# Patient Record
Sex: Male | Born: 1935
Health system: Southern US, Community
[De-identification: ages and names within clinical notes are randomized; demographics above are authoritative.]

## PROBLEM LIST (undated history)

## (undated) DIAGNOSIS — I739 Peripheral vascular disease, unspecified: Secondary | ICD-10-CM

## (undated) DIAGNOSIS — I2510 Atherosclerotic heart disease of native coronary artery without angina pectoris: Secondary | ICD-10-CM

## (undated) DIAGNOSIS — K922 Gastrointestinal hemorrhage, unspecified: Secondary | ICD-10-CM

## (undated) DIAGNOSIS — R0600 Dyspnea, unspecified: Secondary | ICD-10-CM

## (undated) DIAGNOSIS — G4709 Other insomnia: Secondary | ICD-10-CM

## (undated) DIAGNOSIS — C679 Malignant neoplasm of bladder, unspecified: Secondary | ICD-10-CM

## (undated) DIAGNOSIS — I10 Essential (primary) hypertension: Secondary | ICD-10-CM

## (undated) DIAGNOSIS — E119 Type 2 diabetes mellitus without complications: Secondary | ICD-10-CM

## (undated) DIAGNOSIS — I509 Heart failure, unspecified: Secondary | ICD-10-CM

## (undated) DIAGNOSIS — E785 Hyperlipidemia, unspecified: Secondary | ICD-10-CM

## (undated) DIAGNOSIS — R42 Dizziness and giddiness: Secondary | ICD-10-CM

## (undated) DIAGNOSIS — G629 Polyneuropathy, unspecified: Secondary | ICD-10-CM

## (undated) DIAGNOSIS — I38 Endocarditis, valve unspecified: Secondary | ICD-10-CM

## (undated) DIAGNOSIS — R0609 Other forms of dyspnea: Secondary | ICD-10-CM

## (undated) HISTORY — DX: Peripheral vascular disease, unspecified: I73.9

## (undated) HISTORY — DX: Atherosclerotic heart disease of native coronary artery without angina pectoris: I25.10

## (undated) HISTORY — PX: CATARACT EXTRACTION: SUR2

## (undated) HISTORY — DX: Hyperlipidemia, unspecified: E78.5

## (undated) HISTORY — PX: HEMORRHOID SURGERY: SHX153

## (undated) HISTORY — PX: COLONOSCOPY: SHX174

## (undated) HISTORY — DX: Gastrointestinal hemorrhage, unspecified: K92.2

## (undated) HISTORY — DX: Endocarditis, valve unspecified: I38

## (undated) HISTORY — DX: Other insomnia: G47.09

## (undated) HISTORY — DX: Dizziness and giddiness: R42

## (undated) HISTORY — DX: Dyspnea, unspecified: R06.00

## (undated) HISTORY — DX: Polyneuropathy, unspecified: G62.9

## (undated) HISTORY — PX: INGUINAL HERNIA REPAIR: SUR1180

## (undated) HISTORY — DX: Malignant neoplasm of bladder, unspecified: C67.9

## (undated) HISTORY — DX: Other forms of dyspnea: R06.09

## (undated) HISTORY — DX: Essential (primary) hypertension: I10

---

## 2007-04-17 ENCOUNTER — Ambulatory Visit: Payer: Self-pay | Admitting: Vascular Surgery

## 2007-05-27 ENCOUNTER — Ambulatory Visit: Payer: Self-pay | Admitting: Cardiology

## 2007-06-06 ENCOUNTER — Ambulatory Visit: Payer: Self-pay | Admitting: Cardiology

## 2007-07-10 ENCOUNTER — Ambulatory Visit: Payer: Self-pay | Admitting: Vascular Surgery

## 2007-07-31 ENCOUNTER — Ambulatory Visit: Payer: Self-pay | Admitting: Cardiology

## 2008-01-08 ENCOUNTER — Ambulatory Visit: Payer: Self-pay | Admitting: Vascular Surgery

## 2009-08-11 ENCOUNTER — Ambulatory Visit: Payer: Self-pay | Admitting: Vascular Surgery

## 2010-02-01 ENCOUNTER — Ambulatory Visit: Payer: Self-pay | Admitting: Vascular Surgery

## 2010-08-02 NOTE — Assessment & Plan Note (Signed)
OFFICE VISIT   JEMEL, ONO [facility]  DOB:  [birth year]                                       04/17/2007  VFIEP#:[id number]   ADDENDUM:  I had a lengthy discussion with the patient today about the  benefits of Plavix versus aspirin alone for his PAD and coronary artery  disease.  I discussed with him that the benefit was minimal compared to  aspirin alone, compared to the cost of the medication.  I believe that  he is probably safe with just aspirin alone.  He will discuss this with  Dr. Sherril Croon as well.   Janetta Hora. Fields, MD  Electronically Signed   CEF/MEDQ  D:  04/17/2007  T:  04/18/2007  Job:  [id number]

## 2010-08-02 NOTE — Assessment & Plan Note (Signed)
OFFICE VISIT   Peter James, Peter James [facility]  DOB:  08/01/1925                                       07/10/2007  AOZHY#:[id number]   The patient returns for followup today for numbness and tingling in both  lower extremities.  He has previous evidence of some mild arterial  occlusive disease and primarily has neuropathy symptoms.  He was last  seen January 2009.  He has had no significant change in his symptoms  since then.  He still complains of burning, numbness, and tingling from  the ankle down into the foot bilaterally.  Both feet are evenly  affected.   He did have a hemoglobin A1c performed by Dr. Sherril Croon which was 5.8.  He  also has been started on Lyrica and he states that he has had some mild  improvement of symptoms.   PHYSICAL EXAM:  Today blood pressure 155/86, pulse 52 and regular.  Lower Extremities:  He has 2+ femoral pulses with absent popliteal and  pedal pulses bilaterally.  ABIs today were 0.71 on the right and 0.99 on  the left.   Overall the patient's symptoms, I believe, are more consistent with  neuropathy than peripheral arterial disease.  He does have some evidence  of mild occlusive disease in the right lower extremity, but certainly  not significant enough to warrant any intervention at this time.  He  will follow up in six months' time for repeat ABIs.  We have also  started him on Pletal 100 mg twice a day.   Janetta Hora. Fields, MD  Electronically Signed   CEF/MEDQ  D:  07/10/2007  T:  07/11/2007  Job:  [id number]   cc:   Marcello Moores, MD  Doreen Beam, MD

## 2010-08-02 NOTE — Procedures (Signed)
LOWER EXTREMITY ARTERIAL EVALUATION-SINGLE LEVEL   INDICATION:  Bilateral foot numbness.  Patient complains of bilateral  foot numbness at rest with position.  Patient denies claudication  bilaterally.   HISTORY:  Diabetes:  No.  Cardiac:  No.  Hypertension:  Yes.  Smoking:  No.  Previous Surgery:  No.   RESTING SYSTOLIC PRESSURES: (ABI)                          RIGHT                LEFT  Brachial:               130                  140  Anterior tibial:        100 (0.71)           90  Posterior tibial:       90                   138 (.99)  Peroneal:  DOPPLER WAVEFORM ANALYSIS:  Anterior tibial:        Monophasic           Monophasic  Posterior tibial:       Monophasic           Biphasic  Peroneal:   PREVIOUS ABI'S:  Date: 03/27/07  RIGHT:  0.89  LEFT:  0.99   IMPRESSION:  1. Decrease in right ankle brachial index.  2. Stable left ankle brachial index.   ___________________________________________  Janetta Hora Fields, MD   PB/MEDQ  D:  07/10/2007  T:  07/10/2007  Job:  [id number]

## 2010-08-02 NOTE — Assessment & Plan Note (Signed)
OFFICE VISIT   RAKWON, LETOURNEAU  DOB:  1925-09-26                                       01/08/2008  ZOXWR#:[id number]   The patient returns for further followup today.  He was last seen in  April 2009.  He has had chronic problems of neuropathy but also some  mild lower extremity arterial occlusive disease.  His primary complaint  is numbness and tingling in the legs and feet.  He does not really  describe claudication symptoms.  He denies history of diabetes.  He had  a hemoglobin A1c performed by Dr. Sherril Croon in April 2009 which was 5.8.  Dr.  Sherril Croon also started him on Lyrica at that time he has had some improvement  of his symptoms.  He states that he has continued to take his Pletal,  but has not noticed any benefit from now.  I told him he could stop this  today.  He currently walks about 20 minutes every other day without any  claudication symptoms.  He denies any other possible risk factors for  neuropathy, but states he did have a thermal injury in both feet due to  cold in [place] several years ago.  I guess this could possibly be enough  trauma to the cause him neuropathy at this point.   PHYSICAL EXAM:  Today blood pressure 162/83 in the left arm, pulse is 93  and regular.  Lower Extremities:  He has 2+ femoral pulses bilaterally  with absent popliteal and pedal pulses.   ABIs today were 0.70 on the right and 1.0 on the left.  These are  essentially unchanged from April 2009.  In summary, the patient has  evidence of peripheral arterial occlusive disease in his lower  extremities.  However, his symptomatology is more closely related to, I  think, neuropathy rather than occlusive disease.  He currently is not at  risk of limb loss in either lower extremity.  I reassured him regarding  this today.  He will stop his Pletal.  He will continue to follow up  with me every 6 months for repeat ABIs or sooner if he has progression  of symptoms of development of  nonhealing wounds.   Janetta Hora. Fields, MD  Electronically Signed   CEF/MEDQ  D:  01/08/2008  T:  01/09/2008  Job:  [id number]   cc:   Marcello Moores, MD  Doreen Beam, MD

## 2010-08-02 NOTE — Assessment & Plan Note (Signed)
OFFICE VISIT   EBBIE, CHERRY  DOB:  10-27-1925                                       08/11/2009  ZOXWR#:[id number]   The patient returns for followup today.  He was last seen in October of  2009.  We have been following him for lower extremity arterial occlusive  disease and claudication symptoms.  He presents today complaining still  that he has numbness and tingling and a cold sensation that occurs in  both feet.  He apparently had some relief from Lyrica in the past but  states this no longer helps.  He really does not complain of any  problems with ambulation but states that his symptoms are more nuisance  in nature.  He denies any rest pain.  He denies any claudication.   Chronic medical problems include peripheral neuropathy, hypertension.  He is a former smoker.  His hypertension and neuropathy are currently  stable and being followed by Dr. Sherril Croon.   He states that he does not really have any maneuvers that relieve or  make his symptoms worse.  It is just overall some good days and some bad  days.   REVIEW OF SYSTEMS:  Full 12 point review of systems was performed with  the patient today.  Please see intake referral form for details  regarding that.   SOCIAL HISTORY:  He is married.  He has one child.  He is retired.  He  is a former smoker as mentioned above.   FAMILY HISTORY:  Family history is remarkable for his mother and sisters  who had vascular disease at age less than 7.   MEDICATIONS:  Include lisinopril, amlodipine, diazepam, allopurinol,  clonazepam, aspirin.   ALLERGIES:  He has no known drug allergies.   PHYSICAL EXAM:  Vital signs:  Blood pressure is 155/98 in the left arm,  heart rate 72 and regular.  Oxygen saturation is 97% on room air.  HEENT:  Unremarkable.  Neck:  Has 2+ carotid pulses without bruit.  Chest:  Clear to auscultation.  Cardiac:  Exam is regular rate and  rhythm without murmur.  Abdomen:  Soft, nontender,  nondistended.  No  masses.  Extremities:  He has a 2+ right femoral pulse.  He has a 1+  left femoral pulse.  He has no popliteal or pedal pulses bilaterally.  He has 2+ radial pulses bilaterally.  Musculoskeletal:  He has no  significant edema in the lower extremities.  He has no significant major  joint deformities.  Skin:  Shows no open ulcers or rashes.  Neurologic:  Shows symmetric upper extremity and lower extremity motor strength which  is 5/5.  He has decreased sensation in the feet bilaterally to light  touch.   He had bilateral ABIs today which were 0.83 on the right and 1.09 on the  left.  Waveforms were biphasic to monophasic in character.  I also  reviewed his recent ABIs performed at [facility] on 07/12/2009.  This suggested primarily femoral artery occlusive disease in the common  and superficial femoral arteries with some tibial disease and also some  suggestion of external iliac artery occlusive disease bilaterally.   I had a lengthy discussion with the patient today regarding his lower  extremities.  I still believe most of his symptoms are neuropathic in  nature.  I have switched him  from Lyrica to Neurontin today.  He will  take 300 mg three times a day to see if he gets some relief.  He does  certainly have some component of lower extremity arterial occlusive  disease but I am not completely convinced all of his symptoms are coming  from this.  I did offer him today diagnostic arteriogram possible  angioplasty and stenting especially if he had iliac occlusive disease  but I am not completely convinced this will help all of his symptoms.  He states he will think about whether he wants the arteriogram done.  He  will follow up with me in six months' time with repeat ABIs in either  case.  He will follow up sooner if he wishes to have an arteriogram  performed.     Janetta Hora. Fields, MD  Electronically Signed   CEF/MEDQ  D:  08/11/2009  T:  08/12/2009  Job:   [id number]   cc:   Marcello Moores, MD  Doreen Beam, MD

## 2010-08-02 NOTE — Assessment & Plan Note (Signed)
OFFICE VISIT   Peter James, Peter James [facility]  DOB:  [birth year]                                       04/17/2007  BMWUX#:[id number]   The patient is a 77 year old male referred by Dr. Sherril Croon for evaluation of  lower extremity numbness and tingling.  The patient states that his feet  have been numb and cold chronically.  This has been going on for  approximately 3 years.  He has had extensive workup for this, what  sounds like an evaluation for neuropathy as well as peripheral arterial  disease.  He has had no previous episodes of ulcerations to the feet.  He denies rest pain.  He denies classic claudication symptoms.   His atherosclerotic risk factors include hypertension.  He denies prior  history of diabetes.  He denies any significant alcohol history.   His family history is remarkable for his mother who had vascular disease  at a young age.   His past surgical history had a left inguinal hernia repair on 2  occasions.  He has also had a hemorrhoidectomy.   MEDICATIONS:  Include allopurinol 300 mg once a day, Meclizine 25 mg  once a day, Plavix 75 mg once a day started for his PAD.  Lisinopril 20  mg once a day.  Amlodipine 5 mg once a day.  Diclofenac 75 mg once a  day.  Lyrica 75 mg nightly.   PAST MEDICAL HISTORY:  Significant for neuropathy, right shoulder  arthritis, and occasional dizzy spells.   SOCIAL HISTORY:  He is married.  He is a nonsmoker.  He quit smoking in  1975.  He does not drink alcohol regularly and has not drank for several  years.  He was never a heavy drinker.   REVIEW OF SYSTEMS:  He is 6 feet 2 inches, 219 pounds.  Cardiac, pulmonary, GI, renal, psychiatric, ENT, and hematologic systems  are negative.  VASCULAR:  He denies prior history of stroke or TIA.  NEUROLOGIC:  He has occasional dizzy spells, as mentioned above.  He has  multiple joint arthritis.   He has no known drug allergies.   PHYSICAL EXAM:  Blood pressure is 153/95  in the left arm, heart rate is  70 and regular.  HEENT is unremarkable.  Neck has 2+ carotid pulses  without bruits.  Chest was clear to auscultation.  Cardiac exam is  regular rate and rhythm without murmur.  Abdomen is soft and nontender.  Nondistended.  Extremities, he has 2+ brachial and radial pulses  bilaterally.  He has 2+ femoral pulses with absent popliteal and pedal  pulses bilaterally.  He has thickened toenails on both feet, but no  ulcerations.  The feet are cool, but adequately perfused.   He had bilateral ABI performed in [place] on March 27, 2007.  This showed  ABI on the right of 0.89 and on the left of 0.99.  Doppler signals were  monophasic and the dorsalis pedis and posterior tibial artery  bilaterally.   I believe the patient's symptoms are primarily neuropathic in nature.  Hopefully, he will have some relief with his Lyrica medication.  He does  have slightly reduced ABI bilaterally.  On exam, he probably has some  superficial femoral artery and tibial artery occlusive disease.  His  symptoms are not severe enough to warrant any intervention at this time.  I have counseled him that he should continue walking and to check his  feet daily for any signs of trauma.  Have also ordered hemoglobin A1c to  test him again for any evidence of diabetes since he has several  neurologic symptoms related to this.  He will follow up with me in 3  months' time for repeat ABI.  I have reassured him that he is at low  risk of limb loss at this point.   Janetta Hora. Fields, MD  Electronically Signed   CEF/MEDQ  D:  04/17/2007  T:  04/18/2007  Job:  [id number]   cc:   Dr. Katherene Ponto, MD

## 2010-08-02 NOTE — Assessment & Plan Note (Signed)
[facility]                          [facility] OFFICE NOTE   Peter James, Peter James [facility]                  MRN:          [id number]  DATE:05/27/2007                            DOB:          01-06-1926    PRIMARY CARDIOLOGIST:  Learta Codding, MD, (new.)   REFERRING PHYSICIAN:  Doreen Beam, MD.   REASON FOR CONSULTATION:  Risk stratification.   HISTORY:  Peter James is a very pleasant 77 year old male, with no  history of documented coronary artery disease, who is now referred for  risk stratification.   The patient was recently evaluated by Dr. Fabienne Bruns in [place]  for possible peripheral vascular disease.  This was following some  recent lower extremity arterial Dopplers for evaluation of lower  extremity numbness which suggested mild decrease,  particularly on the  right.  The ABIs were 0.89 right; 0.99.  Of note, the TBIs were 0.53  right and 0.69 left.   Dr. Darrick Penna noted that the patient denied any classic symptoms of  claudication and, in fact, he does so today, as well.  Dr. Darrick Penna  concluded that the patient's symptoms were primarily neuropathic, for  which hopefully Lyrica would provide some relief.  He recommended  continued conservative measures (i.e. walking).  He also ordered a  hemoglobin A1c, which was reportedly negative.   The patient's cardiac risk factors are notable for hypertension, age and  significant family history.  He has no known diabetes mellitus.  A  recent lipid profile revealed total cholesterol 171, triglyceride 175,  HDL 37, LDL 99.  The patient denies any history of  angina pectoris,  either with exertion or at rest.  He denies any recent development of  exertional dyspnea.  He also denies any symptoms suggestive of  intermittent claudication.   The patient refers to some transient arm numbness some 25 years ago, for  which he was transferred to [facility] for further evaluation.  He  reports  having had a cardiac catheterization, which was reportedly  normal.  He has not had any subsequent cardiac testing.   DIAGNOSTICS:  Electrocardiogram today reveals NSR at 79 bpm with normal  axis and nonspecific ST abnormalities.   ALLERGIES:  NO KNOWN DRUG ALLERGIES.   CURRENT MEDICATIONS:  1. Aspirin 81 daily.  2. Allopurinol.  3. Lisinopril 20 daily.  4. Amlodipine 5 daily.  5. Lyrica 75 daily.   PAST MEDICAL HISTORY:  1. Hypertension.  2. Peripheral vascular disease as outlined above.  3. Arthritis.   PAST SURGICAL HISTORY:  Hernia repair.   SOCIAL HISTORY:  The patient lives with his wife.  They have 1 grown  daughter.  He is retired from [employer].  He stopped smoking some  35 years ago.  Denies alcohol use.   FAMILY HISTORY:  Mother died at age 53 of a fatal myocardial infarction.  Father succumbed to cancer.  The patient reports all 4 of his sisters  died of heart attacks, 2 suddenly.  He has no brothers.   PHYSICAL EXAMINATION:  VITAL SIGNS:  Blood pressure 143/89, pulse 77  regular, weight 215.6.  GENERAL:  A 77 year old male sitting upright in no distress.  HEENT:  Normocephalic, atraumatic.  NECK:  Palpable bilateral carotid pulse without bruits; no JVD.  LUNGS:  Clear to auscultation in all fields.  HEART:  Regular rate and rhythm (S1-S2) no significant murmurs.  ABDOMEN:  Soft, nontender, intact bowel sounds.  EXTREMITIES:  Palpable femoral pulses with soft left bruit; nonpalpable  posterior tibialis pulses.  No edema.  NEURO:  No focal deficit.   IMPRESSION:  Peter James is a very pleasant 77 year old male, with no  prior cardiac history, but several cardiac risk factors including mild  peripheral vascular disease.  He is now referred for cardiac risk  stratification.   The patient presents with no symptoms of angina pectoris.  Moreover, he  denies any symptoms of classic claudication.  His recent ABIs, reviewed  by Dr. Fabienne Bruns, suggests  mild disease on the right and  conservative management was recommended.   PLAN:  1. Proceed with an exercise stress Cardiolite for risk stratification.      We will then have the patient return to our clinic in 1 month for      review of the study results and further recommendations.  2. With respect to lipid management, the patient has been advised to      continue conservative management with diet/exercise program.  A      recent LDL is 99 and, in the absence of any      documented CAD,  I suggested to him that there is currently no      indication for initiating medical therapy.      Gene [name], PA-C  Electronically Signed      Learta Codding, MD,FACC  Electronically Signed   GS/MedQ  DD: 05/27/2007  DT: 05/28/2007  Job #: [id number]

## 2010-10-03 ENCOUNTER — Ambulatory Visit (INDEPENDENT_AMBULATORY_CARE_PROVIDER_SITE_OTHER): Payer: Self-pay | Admitting: Internal Medicine

## 2012-04-19 ENCOUNTER — Encounter: Payer: Self-pay | Admitting: Cardiology

## 2012-05-02 ENCOUNTER — Ambulatory Visit: Payer: Self-pay | Admitting: Cardiology

## 2012-05-20 ENCOUNTER — Encounter: Payer: Self-pay | Admitting: Physician Assistant

## 2012-05-20 ENCOUNTER — Ambulatory Visit: Payer: Self-pay | Admitting: Physician Assistant

## 2012-05-20 ENCOUNTER — Encounter: Payer: Self-pay | Admitting: *Deleted

## 2012-05-20 ENCOUNTER — Ambulatory Visit (INDEPENDENT_AMBULATORY_CARE_PROVIDER_SITE_OTHER): Payer: Medicare Other | Admitting: Physician Assistant

## 2012-05-20 VITALS — BP 151/94 | HR 74 | Ht 73.0 in | Wt 213.4 lb

## 2012-05-20 DIAGNOSIS — I739 Peripheral vascular disease, unspecified: Secondary | ICD-10-CM | POA: Insufficient documentation

## 2012-05-20 DIAGNOSIS — R0609 Other forms of dyspnea: Secondary | ICD-10-CM

## 2012-05-20 DIAGNOSIS — E1159 Type 2 diabetes mellitus with other circulatory complications: Secondary | ICD-10-CM | POA: Insufficient documentation

## 2012-05-20 DIAGNOSIS — R0600 Dyspnea, unspecified: Secondary | ICD-10-CM

## 2012-05-20 DIAGNOSIS — I152 Hypertension secondary to endocrine disorders: Secondary | ICD-10-CM | POA: Insufficient documentation

## 2012-05-20 DIAGNOSIS — I38 Endocarditis, valve unspecified: Secondary | ICD-10-CM | POA: Insufficient documentation

## 2012-05-20 DIAGNOSIS — I10 Essential (primary) hypertension: Secondary | ICD-10-CM

## 2012-05-20 DIAGNOSIS — R0602 Shortness of breath: Secondary | ICD-10-CM

## 2012-05-20 NOTE — Assessment & Plan Note (Signed)
Followed by Dr. Fabienne Bruns

## 2012-05-20 NOTE — Assessment & Plan Note (Signed)
Followed by primary M.D. 

## 2012-05-20 NOTE — Assessment & Plan Note (Signed)
Will order a complete echocardiogram, for reassessment of recently noted valvular heart disease. Patient had no significant murmurs on exam. However, a recent echocardiogram (EIM) suggested mild/moderate AR, with otherwise mild regurgitation of the remaining valves.

## 2012-05-20 NOTE — Assessment & Plan Note (Signed)
Will order a dobutamine stress echocardiogram to rule out underlying ischemic heart disease, as possible etiology for his DOE. I am concerned that this recent exacerbation may represent an anginal equivalent. As noted, he denies any history of exertional CP, and had a normal adequate GXT Cardiolite in 2009. A recent echocardiogram indicated normal LVF. Will have patient return to me next week for review of study results. We discussed the possibility of a cardiac catheterization, if there is any suggestion of ischemia. The patient was agreeable with this initial noninvasive approach.

## 2012-05-20 NOTE — Progress Notes (Signed)
Primary Cardiologist:  Peter Huh, MD (new)   HPI: Patient referred for evaluation of dyspnea, per Dr. Sherril James' request.   He has no documented history of heart disease. He was previously seen by [place] here in the clinic in March 2009 for a cardiac evaluation, given multiple CRFs and recently diagnosed possible mild PAD (ABI 0.89 right; ABI 0.99 left). He had been seen by Dr. Fabienne James who noted no classic symptoms of claudication, and felt that the patient's symptoms were primarily neuropathic. He prescribed Lyrica and recommended a walking program. We proceeded with a GXT Cardiolite, which was an adequate study (90% MPHR), and which yielded NL perfusion and NL wall motion; EF 56%.  Most recently, he had an echocardiogram performed in Dr. Sherril James' office, January 6, indicating normal LVF (EF 55-60%), with mild LVH; mild MR; mild/moderate AR; and mild TR and PR.  He suggests worsening DOE x1 month, but denies any interim development of exertional CP. His symptoms are precipitated either by walking up a full flight of stairs, or an incline. He denies symptoms suggestive of heart failure. He denies intermittent claudication.   12-lead EKG today, reviewed by me, indicates NSR with first degree AVB at 74 bpm; NL axis; occasional PACs; no definite ischemic changes  No Known Allergies   Current Outpatient Prescriptions  Medication Sig Dispense Refill  . amLODipine (NORVASC) 5 MG tablet Take 5 mg by mouth daily.      [name] aspirin 81 MG tablet Take 81 mg by mouth daily.      . clonazePAM (KLONOPIN) 2 MG tablet Take 2 mg by mouth at bedtime as needed for anxiety.      [name] lisinopril (PRINIVIL,ZESTRIL) 20 MG tablet Take 20 mg by mouth daily.      [name] allopurinol (ZYLOPRIM) 300 MG tablet Take 300 mg by mouth as needed.      . meclizine (ANTIVERT) 25 MG tablet Take 25 mg by mouth as needed.       No current facility-administered medications for this visit.    Past Medical History  Diagnosis Date  . PAD  (peripheral artery disease)     ABIs, 2009: 0.89 right; 0.99 left  . Acute lower GI bleeding     Recurrent: 2011, 2012  . Other insomnia   . Neuropathy   . DM (diabetes mellitus)   . HTN (hypertension)   . Vertigo   . Bladder cancer     Transitional cell, 2001  . Valvular heart disease     Past Surgical History  Procedure Laterality Date  . Hemorrhoid surgery    . Inguinal hernia repair    . Cataract extraction      bi lateral  . Colonoscopy      History   Social History  . Marital Status: Married    Spouse Name: N/A    Number of Children: N/A  . Years of Education: N/A   Occupational History  . Not on file.   Social History Main Topics  . Smoking status: Former Smoker    Quit date: 04/19/1972  . Smokeless tobacco: Not on file  . Alcohol Use: No  . Drug Use: Not on file  . Sexually Active: Not on file   Other Topics Concern  . Not on file   Social History Narrative  . No narrative on file    No family history on file.  ROS: no nausea, vomiting; no fever, chills; no melena, hematochezia; no claudication  PHYSICAL EXAM: BP 151/94  Pulse  74  Ht 6\' 1"  (1.854 m)  Wt 213 lb 6.4 oz (96.798 kg)  BMI 28.16 kg/m2  SpO2 97% GENERAL:  79 year old male; NAD HEENT: NCAT, PERRLA, EOMI; sclera clear; no xanthelasma NECK: palpable bilateral carotid pulses, no bruits; no JVD; no TM LUNGS: CTA bilaterally CARDIAC: RRR (S1, S2); no significant murmurs; positive S4 ABDOMEN: soft, non-tender; intact BS EXTREMETIES: Palpable femoral pulses with soft right-sided bruit; non-palpable PTs; trace peripheral edema SKIN: warm/dry; no obvious rash/lesions MUSCULOSKELETAL: no joint deformity NEURO: no focal deficit; NL affect   EKG: reviewed and available in Electronic Records   ASSESSMENT & PLAN:  Exertional dyspnea Will order a dobutamine stress echocardiogram to rule out underlying ischemic heart disease, as possible etiology for his DOE. I am concerned that this  recent exacerbation may represent an anginal equivalent. As noted, he denies any history of exertional CP, and had a normal adequate GXT Cardiolite in 2009. A recent echocardiogram indicated normal LVF. Will have patient return to me next week for review of study results. We discussed the possibility of a cardiac catheterization, if there is any suggestion of ischemia. The patient was agreeable with this initial noninvasive approach.   Valvular heart disease Will order a complete echocardiogram, for reassessment of recently noted valvular heart disease. Patient had no significant murmurs on exam. However, a recent echocardiogram (EIM) suggested mild/moderate AR, with otherwise mild regurgitation of the remaining valves.  HTN (hypertension) Followed by primary M.D.  PAD (peripheral artery disease) Followed by Dr. Lujean James, PAC

## 2012-05-20 NOTE — Patient Instructions (Signed)
   Echo  Dobutamine Stress Echo Office will notify of results Continue all current medications. Follow up in  1 week

## 2012-05-21 ENCOUNTER — Telehealth: Payer: Self-pay | Admitting: Physician Assistant

## 2012-05-21 ENCOUNTER — Other Ambulatory Visit: Payer: Self-pay | Admitting: *Deleted

## 2012-05-21 DIAGNOSIS — I38 Endocarditis, valve unspecified: Secondary | ICD-10-CM

## 2012-05-21 DIAGNOSIS — R0602 Shortness of breath: Secondary | ICD-10-CM

## 2012-05-21 NOTE — Telephone Encounter (Signed)
Auth # [id number] exp 06/18/12 No precert required for this member's [facility] policy per [facility] online

## 2012-05-21 NOTE — Telephone Encounter (Signed)
Dobutamine Stress Echo scheduled for 05/24/12 @ [facility]

## 2012-05-23 ENCOUNTER — Other Ambulatory Visit (INDEPENDENT_AMBULATORY_CARE_PROVIDER_SITE_OTHER): Payer: Medicare Other

## 2012-05-23 ENCOUNTER — Institutional Professional Consult (permissible substitution): Payer: Self-pay | Admitting: Cardiology

## 2012-05-23 ENCOUNTER — Other Ambulatory Visit: Payer: Self-pay

## 2012-05-23 ENCOUNTER — Other Ambulatory Visit: Payer: Medicare Other

## 2012-05-23 DIAGNOSIS — I38 Endocarditis, valve unspecified: Secondary | ICD-10-CM

## 2012-05-27 ENCOUNTER — Ambulatory Visit: Payer: Medicare Other | Admitting: Physician Assistant

## 2012-05-30 ENCOUNTER — Ambulatory Visit: Payer: Medicare Other | Admitting: Physician Assistant

## 2012-05-31 DIAGNOSIS — R079 Chest pain, unspecified: Secondary | ICD-10-CM

## 2012-06-03 ENCOUNTER — Other Ambulatory Visit: Payer: Self-pay | Admitting: *Deleted

## 2012-06-03 ENCOUNTER — Encounter: Payer: Self-pay | Admitting: *Deleted

## 2012-06-03 ENCOUNTER — Telehealth: Payer: Self-pay | Admitting: Physician Assistant

## 2012-06-03 ENCOUNTER — Ambulatory Visit (INDEPENDENT_AMBULATORY_CARE_PROVIDER_SITE_OTHER): Payer: Medicare Other | Admitting: Physician Assistant

## 2012-06-03 ENCOUNTER — Encounter: Payer: Self-pay | Admitting: Physician Assistant

## 2012-06-03 VITALS — BP 156/93 | HR 89 | Ht 73.5 in | Wt 216.8 lb

## 2012-06-03 DIAGNOSIS — R0600 Dyspnea, unspecified: Secondary | ICD-10-CM

## 2012-06-03 DIAGNOSIS — R0609 Other forms of dyspnea: Secondary | ICD-10-CM

## 2012-06-03 DIAGNOSIS — Z01810 Encounter for preprocedural cardiovascular examination: Secondary | ICD-10-CM

## 2012-06-03 LAB — PROTIME-INR

## 2012-06-03 MED ORDER — AMLODIPINE BESYLATE 10 MG PO TABS: 10.0000 mg | ORAL_TABLET | Freq: Every day | ORAL | Status: AC

## 2012-06-03 MED ORDER — AMLODIPINE BESYLATE 10 MG PO TABS: 10.0000 mg | ORAL_TABLET | Freq: Every day | ORAL | Status: DC

## 2012-06-03 NOTE — Telephone Encounter (Signed)
Left & right JV heart cath - Thursday, 3/20 - 10:30 - [place]

## 2012-06-03 NOTE — Assessment & Plan Note (Signed)
Despite his recent negative dobutamine stress echocardiogram, I remain concerned that his DOE represents an anginal equivalent. He also reports today that he has significant FH of CAD. All 4 of his sisters, who are since deceased, had MIs, in some cases fatal, ranging in age from 64-83. Moreover, both parents also had MIs. Therefore, after extensive discussion, and following consultation with Dr. Myrtis Ser, we have recommended proceeding further with a cardiac catheterization, and the patient has agreed. Risks/benefits have been reviewed. Of note, we will schedule this as a right/left heart catheterization, so as to also rule out pulmonary hypertension. Recent echocardiogram yielded RVSP of 49 mmHg.

## 2012-06-03 NOTE — Progress Notes (Signed)
Primary Cardiologist: Simona Huh, MD   HPI: Scheduled one-month followup.  Patient recently referred for evaluation of dyspnea, per Dr. Sherril Croon' request, in the context of no documented CAD. He had recently documented normal LVF, with mild MR and mild/moderate AR, by echocardiography in January, in Dr. Sherril Croon' office.  I proceeded with a complete echocardiogram and a dobutamine stress echocardiogram, the latter to rule out ischemia, with results as follows:   - echocardiogram, March 6: EF 70-75%, grade 1 diastolic dysfunction, mild AR; mildly dilated ascending aorta; RVSP 49 mmHg   - Dobutamine stress echocardiogram, March 14: Negative for ischemia  As recently documented, the patient continues to report no exertional CP. However, we reviewed and the precipitating factors for his DOE. Specifically, he notes that after only 15 minutes of walking at the mall he becomes easily fatigued and SOB, and has to rest. He also cites typical chores such as vacuuming or mopping, or doing yard work. Again, in all these cases he does not experience any associated chest discomfort. He also denies any exacerbation since his recent OV.  No Known Allergies  Current Outpatient Prescriptions  Medication Sig Dispense Refill  . allopurinol (ZYLOPRIM) 300 MG tablet Take 300 mg by mouth as needed.      [name] amLODipine (NORVASC) 5 MG tablet Take 5 mg by mouth daily.      [name] aspirin 81 MG tablet Take 81 mg by mouth daily.      . clonazePAM (KLONOPIN) 2 MG tablet Take 2 mg by mouth at bedtime as needed for anxiety.      [name] lisinopril (PRINIVIL,ZESTRIL) 20 MG tablet Take 20 mg by mouth daily.      . meclizine (ANTIVERT) 25 MG tablet Take 25 mg by mouth as needed.       No current facility-administered medications for this visit.    Past Medical History  Diagnosis Date  . PAD (peripheral artery disease)     ABIs, 2009: 0.89 right; 0.99 left  . Acute lower GI bleeding     Recurrent: 2011, 2012  . Other insomnia   .  Neuropathy   . HTN (hypertension)   . Vertigo   . Bladder cancer     Transitional cell, 2001  . Valvular heart disease     Past Surgical History  Procedure Laterality Date  . Hemorrhoid surgery    . Inguinal hernia repair    . Cataract extraction      bi lateral  . Colonoscopy      History   Social History  . Marital Status: Married    Spouse Name: N/A    Number of Children: N/A  . Years of Education: N/A   Occupational History  . Not on file.   Social History Main Topics  . Smoking status: Former Smoker    Quit date: 04/19/1972  . Smokeless tobacco: Not on file  . Alcohol Use: No  . Drug Use: Not on file  . Sexually Active: Not on file   Other Topics Concern  . Not on file   Social History Narrative  . No narrative on file     Family History  Problem Relation Age of Onset  . Heart attack Mother   . Heart attack Father   . Heart attack Sister   . Heart attack Sister   . Heart attack Sister   . Heart attack Sister     ROS: no nausea, vomiting; no fever, chills; no melena, hematochezia; no claudication  PHYSICAL EXAM:  BP 156/93  Pulse 89  Ht 6' 1.5" (1.867 m)  Wt 216 lb 12.8 oz (98.34 kg)  BMI 28.21 kg/m2 GENERAL: 79 year old male; NAD  HEENT: NCAT, PERRLA, EOMI; sclera clear; no xanthelasma  NECK: palpable bilateral carotid pulses, no bruits; no JVD; no TM  LUNGS: CTA bilaterally  CARDIAC: RRR (S1, S2); no significant murmurs; positive S4  ABDOMEN: soft, non-tender; intact BS  EXTREMETIES: Palpable femoral pulses with soft right-sided bruit; non-palpable PTs; trace peripheral edema  SKIN: warm/dry; no obvious rash/lesions  MUSCULOSKELETAL: no joint deformity  NEURO: no focal deficit; NL affect    EKG:    ASSESSMENT & PLAN:  Exertional dyspnea Despite his recent negative dobutamine stress echocardiogram, I remain concerned that his DOE represents an anginal equivalent. He also reports today that he has significant FH of CAD. All 4 of his  sisters, who are since deceased, had MIs, in some cases fatal, ranging in age from 45-83. Moreover, both parents also had MIs. Therefore, after extensive discussion, and following consultation with Dr. Myrtis Ser, we have recommended proceeding further with a cardiac catheterization, and the patient has agreed. Risks/benefits have been reviewed. Of note, we will schedule this as a right/left heart catheterization, so as to also rule out pulmonary hypertension. Recent echocardiogram yielded RVSP of 49 mmHg.  HTN (hypertension) Remains uncontrolled, despite having taking a.m. medications. Will increase amlodipine to 10 mg daily.  Valvular heart disease Recent followup echocardiogram yielded mild AR and no significant MR.  PAD (peripheral artery disease) Followed by Dr. Lujean Rave, PAC

## 2012-06-03 NOTE — Assessment & Plan Note (Signed)
Recent followup echocardiogram yielded mild AR and no significant MR.

## 2012-06-03 NOTE — Patient Instructions (Signed)
   Increase Amlodipine to 10mg  daily Continue all other current medications.  Left and right JV heart cath - see info sheet [facility] Follow up given after procedure above

## 2012-06-03 NOTE — Assessment & Plan Note (Signed)
Followed by Dr. Fabienne Bruns

## 2012-06-03 NOTE — Assessment & Plan Note (Signed)
Remains uncontrolled, despite having taking a.m. medications. Will increase amlodipine to 10 mg daily.

## 2012-06-05 ENCOUNTER — Other Ambulatory Visit: Payer: Self-pay | Admitting: Physician Assistant

## 2012-06-05 DIAGNOSIS — R079 Chest pain, unspecified: Secondary | ICD-10-CM

## 2012-06-06 ENCOUNTER — Inpatient Hospital Stay (HOSPITAL_BASED_OUTPATIENT_CLINIC_OR_DEPARTMENT_OTHER)
Admission: RE | Admit: 2012-06-06 | Discharge: 2012-06-06 | Disposition: A | Discharge status: Home / Self Care | Payer: Medicare Other | Source: Ambulatory Visit | Attending: Cardiovascular Disease | Admitting: Cardiovascular Disease

## 2012-06-06 ENCOUNTER — Encounter (HOSPITAL_BASED_OUTPATIENT_CLINIC_OR_DEPARTMENT_OTHER)
Admission: RE | Disposition: A | Discharge status: Home / Self Care | Payer: Self-pay | Source: Ambulatory Visit | Attending: Cardiovascular Disease

## 2012-06-06 DIAGNOSIS — I359 Nonrheumatic aortic valve disorder, unspecified: Secondary | ICD-10-CM | POA: Insufficient documentation

## 2012-06-06 DIAGNOSIS — R0609 Other forms of dyspnea: Secondary | ICD-10-CM | POA: Insufficient documentation

## 2012-06-06 DIAGNOSIS — I2789 Other specified pulmonary heart diseases: Secondary | ICD-10-CM | POA: Insufficient documentation

## 2012-06-06 DIAGNOSIS — R079 Chest pain, unspecified: Secondary | ICD-10-CM

## 2012-06-06 DIAGNOSIS — R0602 Shortness of breath: Secondary | ICD-10-CM

## 2012-06-06 DIAGNOSIS — R0989 Other specified symptoms and signs involving the circulatory and respiratory systems: Secondary | ICD-10-CM | POA: Insufficient documentation

## 2012-06-06 DIAGNOSIS — I2510 Atherosclerotic heart disease of native coronary artery without angina pectoris: Secondary | ICD-10-CM | POA: Insufficient documentation

## 2012-06-06 LAB — POCT I-STAT 3, ART BLOOD GAS (G3+)
Acid-base deficit: 3 mmol/L — ABNORMAL HIGH (ref 0.0–2.0)
Bicarbonate: 22 mEq/L (ref 20.0–24.0)
O2 Saturation: 89 %
TCO2: 23 mmol/L (ref 0–100)
pCO2 arterial: 38.4 mmHg (ref 35.0–45.0)
pH, Arterial: 7.365 (ref 7.350–7.450)
pO2, Arterial: 58 mmHg — ABNORMAL LOW (ref 80.0–100.0)

## 2012-06-06 LAB — POCT I-STAT 3, VENOUS BLOOD GAS (G3P V)
Acid-Base Excess: 2 mmol/L (ref 0.0–2.0)
Bicarbonate: 28.4 mEq/L — ABNORMAL HIGH (ref 20.0–24.0)
O2 Saturation: 59 %
TCO2: 30 mmol/L (ref 0–100)
pCO2, Ven: 50.4 mmHg — ABNORMAL HIGH (ref 45.0–50.0)
pH, Ven: 7.358 — ABNORMAL HIGH (ref 7.250–7.300)
pO2, Ven: 33 mmHg (ref 30.0–45.0)

## 2012-06-06 SURGERY — JV LEFT AND RIGHT HEART CATHETERIZATION WITH CORONARY ANGIOGRAM
Anesthesia: Moderate Sedation

## 2012-06-06 MED ORDER — SODIUM CHLORIDE 0.9 % IV SOLN: INTRAVENOUS | Status: AC

## 2012-06-06 MED ORDER — ONDANSETRON HCL 4 MG/2ML IJ SOLN: 4.0000 mg | Freq: Four times a day (QID) | INTRAMUSCULAR | Status: DC | PRN

## 2012-06-06 MED ORDER — SODIUM CHLORIDE 0.9 % IV SOLN: 250.0000 mL | INTRAVENOUS | Status: DC | PRN

## 2012-06-06 MED ORDER — SODIUM CHLORIDE 0.9 % IJ SOLN: 3.0000 mL | Freq: Two times a day (BID) | INTRAMUSCULAR | Status: DC

## 2012-06-06 MED ORDER — SODIUM CHLORIDE 0.9 % IJ SOLN: 3.0000 mL | INTRAMUSCULAR | Status: DC | PRN

## 2012-06-06 MED ORDER — ASPIRIN 81 MG PO CHEW
324.0000 mg | CHEWABLE_TABLET | ORAL | Status: AC
  Administered 2012-06-06: 324 mg via ORAL

## 2012-06-06 MED ORDER — SODIUM CHLORIDE 0.9 % IV SOLN
INTRAVENOUS | Status: DC
  Administered 2012-06-06: 09:00:00 via INTRAVENOUS

## 2012-06-06 MED ORDER — ACETAMINOPHEN 325 MG PO TABS: 650.0000 mg | ORAL_TABLET | ORAL | Status: DC | PRN

## 2012-06-06 NOTE — H&P (View-Only) (Signed)
Primary Cardiologist: Sam McDowell, MD   HPI: Scheduled one-month followup.  Patient recently referred for evaluation of dyspnea, per Dr. [name]' request, in the context of no documented CAD. He had recently documented normal LVF, with mild MR and mild/moderate AR, by echocardiography in January, in Dr. [name]' office.  I proceeded with a complete echocardiogram and a dobutamine stress echocardiogram, the latter to rule out ischemia, with results as follows:   - echocardiogram, March 6: EF 70-75%, grade 1 diastolic dysfunction, mild AR; mildly dilated ascending aorta; RVSP 49 mmHg   - Dobutamine stress echocardiogram, March 14: Negative for ischemia  As recently documented, the patient continues to report no exertional CP. However, we reviewed and the precipitating factors for his DOE. Specifically, he notes that after only 15 minutes [place] walking [place] the mall he becomes easily fatigued and SOB, and has to rest. He also cites typical chores such as vacuuming or mopping, or doing yard work. Again, in all these cases he does not experience any associated chest discomfort. He also denies any exacerbation since his recent OV.  No Known Allergies  Current Outpatient Prescriptions  Medication Sig Dispense Refill  . allopurinol (ZYLOPRIM) 300 MG tablet Take 300 mg by mouth as needed.      . amLODipine (NORVASC) 5 MG tablet Take 5 mg by mouth daily.      . aspirin 81 MG tablet Take 81 mg by mouth daily.      . clonazePAM (KLONOPIN) 2 MG tablet Take 2 mg by mouth at bedtime as needed for anxiety.      . lisinopril (PRINIVIL,ZESTRIL) 20 MG tablet Take 20 mg by mouth daily.      . meclizine (ANTIVERT) 25 MG tablet Take 25 mg by mouth as needed.       No current facility-administered medications for this visit.    Past Medical History  Diagnosis Date  . PAD (peripheral artery disease)     ABIs, 2009: 0.89 right; 0.99 left  . Acute lower GI bleeding     Recurrent: 2011, 2012  . Other insomnia   .  Neuropathy   . HTN (hypertension)   . Vertigo   . Bladder cancer     Transitional cell, 2001  . Valvular heart disease     Past Surgical History  Procedure Laterality Date  . Hemorrhoid surgery    . Inguinal hernia repair    . Cataract extraction      bi lateral  . Colonoscopy      History   Social History  . Marital Status: Married    Spouse Name: N/A    Number of Children: N/A  . Years of Education: N/A   Occupational History  . Not on file.   Social History Main Topics  . Smoking status: Former Smoker    Quit date: 04/19/1972  . Smokeless tobacco: Not on file  . Alcohol Use: No  . Drug Use: Not on file  . Sexually Active: Not on file   Other Topics Concern  . Not on file   Social History Narrative  . No narrative on file     Family History  Problem Relation Age of Onset  . Heart attack Mother   . Heart attack Father   . Heart attack Sister   . Heart attack Sister   . Heart attack Sister   . Heart attack Sister     ROS: no nausea, vomiting; no fever, chills; no melena, hematochezia; no claudication  PHYSICAL EXAM:   BP 156/93  Pulse 89  Ht 6' 1.5" (1.867 m)  Wt 216 lb 12.8 oz (98.34 kg)  BMI 28.21 kg/m2 GENERAL: 79-year-old male; NAD  HEENT: NCAT, PERRLA, EOMI; sclera clear; no xanthelasma  NECK: palpable bilateral carotid pulses, no bruits; no JVD; no TM  LUNGS: CTA bilaterally  CARDIAC: RRR (S1, S2); no significant murmurs; positive S4  ABDOMEN: soft, non-tender; intact BS  EXTREMETIES: Palpable femoral pulses with soft right-sided bruit; non-palpable PTs; trace peripheral edema  SKIN: warm/dry; no obvious rash/lesions  MUSCULOSKELETAL: no joint deformity  NEURO: no focal deficit; NL affect    EKG:    ASSESSMENT & PLAN:  Exertional dyspnea Despite his recent negative dobutamine stress echocardiogram, I remain concerned that his DOE represents an anginal equivalent. He also reports today that he has significant FH of CAD. All 4 of his  sisters, who are since deceased, had MIs, in some cases fatal, ranging in age from 58-83. Moreover, both parents also had MIs. Therefore, after extensive discussion, and following consultation with Dr. Katz, we have recommended proceeding further with a cardiac catheterization, and the patient has agreed. Risks/benefits have been reviewed. Of note, we will schedule this as a right/left heart catheterization, so as to also rule out pulmonary hypertension. Recent echocardiogram yielded RVSP of 49 mmHg.  HTN (hypertension) Remains uncontrolled, despite having taking a.m. medications. Will increase amlodipine to 10 mg daily.  Valvular heart disease Recent followup echocardiogram yielded mild AR and no significant MR.  PAD (peripheral artery disease) Followed by Dr. Charles Fields    Gene Terrius Gentile, PAC  

## 2012-06-06 NOTE — Interval H&P Note (Signed)
History and Physical Interval Note:  06/06/2012 9:39 AM  Peter James  has presented today for right and left heart cath with the diagnosis of Pulmonary HTN, dyspnea, exclude CAD.   The various methods of treatment have been discussed with the patient and family. After consideration of risks, benefits and other options for treatment, the patient has consented to  Procedure(s): JV LEFT AND RIGHT HEART CATHETERIZATION WITH CORONARY ANGIOGRAM (N/A) as a surgical intervention .  The patient's history has been reviewed, patient examined, no change in status, stable for surgery.  I have reviewed the patient's chart and labs.  Questions were answered to the patient's satisfaction.     Tyrin Herbers

## 2012-06-06 NOTE — OR Nursing (Signed)
Dr McAlhany at bedside to discuss results and treatment plan with pt and family 

## 2012-06-06 NOTE — CV Procedure (Signed)
    Cardiac Catheterization Operative Report  Peter James [id number] 3/20/201411:14 AM [name],[name] B., MD  Procedure Performed:  1. Left Heart Catheterization 2. Selective Coronary Angiography 3. Right Heart Catheterization 4. Left ventricular pressures  Operator: Verne Carrow, MD  Indication:  79 yo AAM with history of HTN with recent severe dyspnea concerning for unstable angina. Stress echo without ischemia but given persistence of symtpoms with no other definite cause, right and left heart cath has been arranged to assess PA pressures and exclude obstructive CAD.                                 Procedure Details: The risks, benefits, complications, treatment options, and expected outcomes were discussed with the patient. The patient and/or family concurred with the proposed plan, giving informed consent. The patient was brought to the cath lab after IV hydration was begun and oral premedication was given. The patient was further sedated with Versed.  The right groin was prepped and draped in the usual manner. Using the modified Seldinger access technique, a 4 French sheath was placed in the right femoral artery. A 6 French sheath was inserted into the right femoral vein. A multi-purpose catheter was used to perform a right heart catheterization. Standard diagnostic catheters were used to perform selective coronary angiography. A pigtail catheter was used to measure LV pressures. There were no immediate complications. The patient was taken to the recovery area in stable condition.   Hemodynamic Findings: Ao:  134/67              LV: 134/14/17 RA:   5            RV: 38/6/9 PA:  39/14 (mean 25)      PCWP:  7 Fick Cardiac Output: 4.4 L/min Fick Cardiac Index: 2.0 L/min/m2 Central Aortic Saturation: 89% Pulmonary Artery Saturation: 59%   Angiographic Findings:  Left main: No obstructive disease noted.   Left Anterior Descending Artery: Large caliber vessel that courses  to the apex. There are two small to moderate caliber diagonal branches. No obstructive disease noted.   Circumflex Artery: Moderate caliber dominant vessel with moderate caliber obtuse marginal branch and small caliber posterolateral branch. The OM branch has 20% stenosis. The posterolateral branch has a 20% stenosis.   Right Coronary Artery: Small to moderate caliber non-dominant vessel with no obstructive disease noted.   Left Ventricular Angiogram: Deferred.   Impression: 1. Mild non-obstructive CAD (known to have normal LV function by echo) 2. Mild pulmonary HTN  Recommendations: Medical management of mild CAD. Would continue ASA and consider low dose statin.        Complications:  None; patient tolerated the procedure well.

## 2012-06-06 NOTE — OR Nursing (Signed)
Tegaderm dressing applied, site level 0, bedrest begins at 1125 

## 2012-06-17 ENCOUNTER — Ambulatory Visit: Payer: Self-pay | Admitting: Cardiology

## 2012-06-20 ENCOUNTER — Encounter: Payer: Self-pay | Admitting: Physician Assistant

## 2012-06-20 ENCOUNTER — Ambulatory Visit (INDEPENDENT_AMBULATORY_CARE_PROVIDER_SITE_OTHER): Payer: Medicare Other | Admitting: Physician Assistant

## 2012-06-20 VITALS — BP 128/84 | HR 83 | Ht 74.0 in | Wt 212.8 lb

## 2012-06-20 DIAGNOSIS — I10 Essential (primary) hypertension: Secondary | ICD-10-CM

## 2012-06-20 DIAGNOSIS — I739 Peripheral vascular disease, unspecified: Secondary | ICD-10-CM

## 2012-06-20 DIAGNOSIS — E785 Hyperlipidemia, unspecified: Secondary | ICD-10-CM

## 2012-06-20 DIAGNOSIS — E1169 Type 2 diabetes mellitus with other specified complication: Secondary | ICD-10-CM | POA: Insufficient documentation

## 2012-06-20 DIAGNOSIS — I2510 Atherosclerotic heart disease of native coronary artery without angina pectoris: Secondary | ICD-10-CM

## 2012-06-20 MED ORDER — ATORVASTATIN CALCIUM 40 MG PO TABS: 40.0000 mg | ORAL_TABLET | Freq: Every evening | ORAL | Status: DC

## 2012-06-20 NOTE — Assessment & Plan Note (Signed)
Much improved, following recent increase in amlodipine dose. Continue combination therapy in conjunction with ACE inhibitor.

## 2012-06-20 NOTE — Assessment & Plan Note (Signed)
Patient reports having been on a statin in the past. We'll reinitiate therapy with Lipitor 40 mg daily, and reassess lipid status in 12 weeks. Aggressive management recommended with target LDL 70 or less, if feasible.

## 2012-06-20 NOTE — Assessment & Plan Note (Signed)
Followed by Dr. Charles Fields 

## 2012-06-20 NOTE — Patient Instructions (Addendum)
   Begin Lipitor 40mg  every evening Continue all other current medications.  Labs in 12 weeks for fasting lipid and liver - will mail reminder  Your physician wants you to follow up in: 6 months.  You will receive a reminder letter in the mail one-two months in advance.  If you don't receive a letter, please call our office to schedule the follow up appointment

## 2012-06-20 NOTE — Progress Notes (Signed)
Primary Cardiologist: Simona Huh, MD   HPI: Status post elective cardiac catheterization, ordered at time of last OV, for ongoing evaluation of persistent exertional. Despite a recent negative dobutamine stress echocardiogram, I remained concerned that this represented an anginal equivalent. He presented with no known CAD, but several CRFs, including known, mild PAD.   - Right/left heart catheterization, March 20: mild, nonobstructive CAD; mild PHTN; LVG deferred (known NL LVF, by echo)  He reports no change from his baseline level of exercise tolerance, and continues to report no exertional CP. He returns today reporting no complications of his groin incision sites.  No Known Allergies  Current Outpatient Prescriptions  Medication Sig Dispense Refill  . allopurinol (ZYLOPRIM) 300 MG tablet Take 300 mg by mouth as needed.      [name] amLODipine (NORVASC) 10 MG tablet Take 1 tablet (10 mg total) by mouth daily.  90 tablet  3  . aspirin 81 MG tablet Take 81 mg by mouth daily.      . clonazePAM (KLONOPIN) 2 MG tablet Take 2 mg by mouth at bedtime as needed for anxiety.      [name] lisinopril (PRINIVIL,ZESTRIL) 20 MG tablet Take 20 mg by mouth daily.      . meclizine (ANTIVERT) 25 MG tablet Take 25 mg by mouth as needed.       No current facility-administered medications for this visit.    Past Medical History  Diagnosis Date  . PAD (peripheral artery disease)     ABIs, 2009: 0.89 right; 0.99 left  . Acute lower GI bleeding     Recurrent: 2011, 2012  . Other insomnia   . Neuropathy   . HTN (hypertension)   . Vertigo   . Bladder cancer     Transitional cell, 2001  . Valvular heart disease   . CAD (coronary atherosclerotic disease)     Nonobstructive, 05/2012  . HLD (hyperlipidemia)   . Exertional dyspnea     EF 70-75%, grade 1 diastolic dysfunction, 05/2012    Past Surgical History  Procedure Laterality Date  . Hemorrhoid surgery    . Inguinal hernia repair    . Cataract extraction       bi lateral  . Colonoscopy      History   Social History  . Marital Status: Married    Spouse Name: N/A    Number of Children: N/A  . Years of Education: N/A   Occupational History  . Not on file.   Social History Main Topics  . Smoking status: Former Smoker    Quit date: 04/19/1972  . Smokeless tobacco: Not on file  . Alcohol Use: No  . Drug Use: Not on file  . Sexually Active: Not on file   Other Topics Concern  . Not on file   Social History Narrative  . No narrative on file   Social History Narrative  . No narrative on file    Problem Relation Age of Onset  . Heart attack Mother   . Heart attack Father   . Heart attack Sister   . Heart attack Sister   . Heart attack Sister   . Heart attack Sister     ROS: no nausea, vomiting; no fever, chills; no melena, hematochezia; no claudication  PHYSICAL EXAM: BP 128/84  Pulse 83  Ht 6\' 2"  (1.88 m)  Wt 212 lb 12.8 oz (96.525 kg)  BMI 27.31 kg/m2  SpO2 96% GENERAL: 79 year old male; NAD  HEENT: NCAT, PERRLA, EOMI; sclera clear; no  xanthelasma  NECK: palpable bilateral carotid pulses, no bruits; no JVD; no TM  LUNGS: CTA bilaterally  CARDIAC: RRR (S1, S2); no significant murmurs; positive S4  ABDOMEN: soft, non-tender; intact BS  EXTREMETIES: no significant peripheral edema  SKIN: warm/dry; no obvious rash/lesions  MUSCULOSKELETAL: no joint deformity  NEURO: no focal deficit; NL affect    EKG:    ASSESSMENT & PLAN:  CAD (coronary atherosclerotic disease) Aggressive secondary prevention recommended.  HTN (hypertension) Much improved, following recent increase in amlodipine dose. Continue combination therapy in conjunction with ACE inhibitor.  HLD (hyperlipidemia) Patient reports having been on a statin in the past. We'll reinitiate therapy with Lipitor 40 mg daily, and reassess lipid status in 12 weeks. Aggressive management recommended with target LDL 70 or less, if feasible.  PAD (peripheral  artery disease) Followed by Dr. Lujean Rave, PAC

## 2012-06-20 NOTE — Assessment & Plan Note (Signed)
Aggressive secondary prevention recommended.

## 2012-07-17 ENCOUNTER — Telehealth: Payer: Self-pay | Admitting: Physician Assistant

## 2012-07-17 MED ORDER — ATORVASTATIN CALCIUM 40 MG PO TABS: 40.0000 mg | ORAL_TABLET | Freq: Every evening | ORAL | Status: DC

## 2012-07-17 NOTE — Telephone Encounter (Signed)
Needs to have Lipitor 40 mg called into [facility].

## 2012-07-17 NOTE — Telephone Encounter (Signed)
Done

## 2012-09-19 ENCOUNTER — Encounter: Payer: Self-pay | Admitting: *Deleted

## 2012-09-19 ENCOUNTER — Telehealth: Payer: Self-pay | Admitting: *Deleted

## 2012-09-19 DIAGNOSIS — I2510 Atherosclerotic heart disease of native coronary artery without angina pectoris: Secondary | ICD-10-CM

## 2012-09-19 DIAGNOSIS — Z79899 Other long term (current) drug therapy: Secondary | ICD-10-CM

## 2012-09-19 DIAGNOSIS — E785 Hyperlipidemia, unspecified: Secondary | ICD-10-CM

## 2012-09-19 NOTE — Telephone Encounter (Signed)
Message copied by Lesle Chris on Thu Sep 19, 2012  1:49 PM ------      Message from: Lesle Chris      Created: Thu Jun 20, 2012  4:12 PM       FLP / LFT  ------

## 2012-09-19 NOTE — Telephone Encounter (Signed)
Orders & letter mailed today.

## 2012-10-17 ENCOUNTER — Telehealth: Payer: Self-pay | Admitting: *Deleted

## 2012-10-17 MED ORDER — ATORVASTATIN CALCIUM 10 MG PO TABS: 10.0000 mg | ORAL_TABLET | Freq: Every evening | ORAL | Status: AC

## 2012-10-17 NOTE — Telephone Encounter (Signed)
Message copied by Lesle Chris on Thu Oct 17, 2012 11:16 AM ------      Message from: Prescott Parma C      Created: Wed Oct 16, 2012  1:56 PM       LDL 16. Reduce Lipitor from 40 to 10 daily. FLP/LFT profile in 12 weeks       ------

## 2012-10-17 NOTE — Telephone Encounter (Signed)
Notes Recorded by Lesle Chris, LPN on 1/61/0960 at 11:16 AM Patient notified. Will send new rx to [facility]. Will mail reminder when time to repeat labs. Patient verbalized understanding.

## 2013-01-13 DIAGNOSIS — B351 Tinea unguium: Secondary | ICD-10-CM | POA: Insufficient documentation

## 2013-01-13 DIAGNOSIS — M2010 Hallux valgus (acquired), unspecified foot: Secondary | ICD-10-CM | POA: Insufficient documentation

## 2013-01-13 DIAGNOSIS — M779 Enthesopathy, unspecified: Secondary | ICD-10-CM | POA: Insufficient documentation

## 2013-01-13 DIAGNOSIS — G629 Polyneuropathy, unspecified: Secondary | ICD-10-CM | POA: Insufficient documentation

## 2013-01-14 DIAGNOSIS — M79609 Pain in unspecified limb: Secondary | ICD-10-CM | POA: Insufficient documentation

## 2013-03-19 ENCOUNTER — Ambulatory Visit: Payer: Medicare Other | Admitting: Cardiology

## 2013-03-31 ENCOUNTER — Ambulatory Visit: Payer: Medicare Other | Admitting: Cardiology

## 2016-04-05 DIAGNOSIS — J449 Chronic obstructive pulmonary disease, unspecified: Secondary | ICD-10-CM | POA: Diagnosis not present

## 2016-04-05 DIAGNOSIS — I10 Essential (primary) hypertension: Secondary | ICD-10-CM | POA: Diagnosis not present

## 2016-04-05 DIAGNOSIS — E7800 Pure hypercholesterolemia, unspecified: Secondary | ICD-10-CM | POA: Diagnosis not present

## 2017-02-21 DIAGNOSIS — E7800 Pure hypercholesterolemia, unspecified: Secondary | ICD-10-CM | POA: Diagnosis not present

## 2017-02-21 DIAGNOSIS — I10 Essential (primary) hypertension: Secondary | ICD-10-CM | POA: Diagnosis not present

## 2017-02-21 DIAGNOSIS — J449 Chronic obstructive pulmonary disease, unspecified: Secondary | ICD-10-CM | POA: Diagnosis not present

## 2017-06-29 DIAGNOSIS — I10 Essential (primary) hypertension: Secondary | ICD-10-CM | POA: Diagnosis not present

## 2017-06-29 DIAGNOSIS — J449 Chronic obstructive pulmonary disease, unspecified: Secondary | ICD-10-CM | POA: Diagnosis not present

## 2017-06-29 DIAGNOSIS — E7800 Pure hypercholesterolemia, unspecified: Secondary | ICD-10-CM | POA: Diagnosis not present

## 2017-08-01 DIAGNOSIS — I10 Essential (primary) hypertension: Secondary | ICD-10-CM | POA: Diagnosis not present

## 2017-08-01 DIAGNOSIS — J449 Chronic obstructive pulmonary disease, unspecified: Secondary | ICD-10-CM | POA: Diagnosis not present

## 2017-08-01 DIAGNOSIS — E7800 Pure hypercholesterolemia, unspecified: Secondary | ICD-10-CM | POA: Diagnosis not present

## 2017-08-15 DIAGNOSIS — Z6827 Body mass index (BMI) 27.0-27.9, adult: Secondary | ICD-10-CM | POA: Diagnosis not present

## 2017-08-15 DIAGNOSIS — I739 Peripheral vascular disease, unspecified: Secondary | ICD-10-CM | POA: Diagnosis not present

## 2017-08-15 DIAGNOSIS — I10 Essential (primary) hypertension: Secondary | ICD-10-CM | POA: Diagnosis not present

## 2017-08-15 DIAGNOSIS — C679 Malignant neoplasm of bladder, unspecified: Secondary | ICD-10-CM | POA: Diagnosis not present

## 2017-08-15 DIAGNOSIS — R42 Dizziness and giddiness: Secondary | ICD-10-CM | POA: Diagnosis not present

## 2017-08-15 DIAGNOSIS — J449 Chronic obstructive pulmonary disease, unspecified: Secondary | ICD-10-CM | POA: Diagnosis not present

## 2017-08-15 DIAGNOSIS — E1165 Type 2 diabetes mellitus with hyperglycemia: Secondary | ICD-10-CM | POA: Diagnosis not present

## 2017-08-15 DIAGNOSIS — Z299 Encounter for prophylactic measures, unspecified: Secondary | ICD-10-CM | POA: Diagnosis not present

## 2017-08-15 DIAGNOSIS — E538 Deficiency of other specified B group vitamins: Secondary | ICD-10-CM | POA: Diagnosis not present

## 2017-10-29 DIAGNOSIS — E7800 Pure hypercholesterolemia, unspecified: Secondary | ICD-10-CM | POA: Diagnosis not present

## 2017-10-29 DIAGNOSIS — I10 Essential (primary) hypertension: Secondary | ICD-10-CM | POA: Diagnosis not present

## 2017-10-29 DIAGNOSIS — J449 Chronic obstructive pulmonary disease, unspecified: Secondary | ICD-10-CM | POA: Diagnosis not present

## 2017-11-14 ENCOUNTER — Ambulatory Visit (INDEPENDENT_AMBULATORY_CARE_PROVIDER_SITE_OTHER): Payer: Medicare HMO | Admitting: Podiatry

## 2017-11-14 ENCOUNTER — Encounter: Payer: Self-pay | Admitting: Podiatry

## 2017-11-14 VITALS — BP 147/89 | HR 84

## 2017-11-14 DIAGNOSIS — M2010 Hallux valgus (acquired), unspecified foot: Secondary | ICD-10-CM

## 2017-11-14 DIAGNOSIS — Q828 Other specified congenital malformations of skin: Secondary | ICD-10-CM

## 2017-11-14 DIAGNOSIS — G5790 Unspecified mononeuropathy of unspecified lower limb: Secondary | ICD-10-CM

## 2017-11-14 DIAGNOSIS — M2041 Other hammer toe(s) (acquired), right foot: Secondary | ICD-10-CM | POA: Diagnosis not present

## 2017-11-14 DIAGNOSIS — R234 Changes in skin texture: Secondary | ICD-10-CM

## 2017-11-14 DIAGNOSIS — M2042 Other hammer toe(s) (acquired), left foot: Secondary | ICD-10-CM | POA: Diagnosis not present

## 2017-11-14 MED ORDER — AMMONIUM LACTATE 12 % EX CREA: TOPICAL_CREAM | CUTANEOUS | 0 refills | Status: DC | PRN

## 2017-11-14 NOTE — Progress Notes (Signed)
This patient presents the office with chief complaint of pain through the bottom of both of his feet.  He states he feels pain and burning  through the  bottom of both feet at night.  He has difficulty especially at night when sleeping.  He says he has developed nerve injury when he was in [place] and developed frostbite.  He has lived with this condition and now is starting to experience this burning in the evening.  He says he is also concerned about scaly numbness on the bottom of both feet.  He says the scaling this started approximately 5 years ago.  He also has a thick callus painful area under the left forefoot.  He says this can be painful walking and wearing his shoes.  He says that he is a patient at the [facility].  [name] He presents the office today for an evaluation and treatment of these neuropathic feet.  General Appearance  Alert, conversant and in no acute stress.  Vascular  Dorsalis pedis and posterior tibial  pulses are weakly  palpable  bilaterally.  Capillary return is within normal limits  bilaterally. Temperature is within normal limits  bilaterally.  Neurologic  Senn-Weinstein monofilament wire test absent   bilaterally. Muscle power within normal limits bilaterally.  Nails Thick disfigured discolored nails with subungual debris  from hallux to fifth toes bilaterally. No evidence of bacterial infection or drainage bilaterally.  Orthopedic  No limitations of motion  feet .  No crepitus or effusions noted.  HAV with hammer toes  B/l.  Skin  normotropic skin noted bilaterally.  No signs of infections or ulcers noted.  Porokeratosis sub 4th met left foot.  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  Porokeratosis sub 4th met left foot.   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  IE.  Prescribe lac-hydrin.  Discussed his feet with this patient.  I discussed this patient's neuropathy with Liliane Channel.  He told me to have this patient make an appointment with  him and he will try to require diabetic shoes from the [facility] in [place]. due to his frostbite condition which has led to absent  LOPS.   Gardiner Barefoot DPM

## 2017-11-23 ENCOUNTER — Telehealth: Payer: Self-pay | Admitting: Podiatry

## 2017-11-23 NOTE — Telephone Encounter (Signed)
Needs office notes faxed to [phone]/Attn: Rodena Piety

## 2017-11-26 ENCOUNTER — Ambulatory Visit: Payer: Non-veteran care | Admitting: Orthotics

## 2017-11-26 DIAGNOSIS — M2042 Other hammer toe(s) (acquired), left foot: Principal | ICD-10-CM

## 2017-11-26 DIAGNOSIS — I739 Peripheral vascular disease, unspecified: Secondary | ICD-10-CM

## 2017-11-26 DIAGNOSIS — M2041 Other hammer toe(s) (acquired), right foot: Secondary | ICD-10-CM

## 2017-11-26 DIAGNOSIS — M2010 Hallux valgus (acquired), unspecified foot: Secondary | ICD-10-CM

## 2017-11-26 NOTE — Progress Notes (Signed)
Patient cast and measured for DM shoes/inserts; however, he must go to [place] and req a PO to [place] at [place] in order to get DM2 shoes.

## 2017-11-29 ENCOUNTER — Telehealth: Payer: Self-pay | Admitting: Podiatry

## 2017-11-29 NOTE — Telephone Encounter (Signed)
Pt called and is at the [facility] with the office note from Dr Prudence Davidson for pt to get diabetic shoes/inserts and they have told patient that they need a rx from [place]. Pt has driven over an hour to get to the [facility] [place] and does not want to have to drive back again.  I have Rick filling out a rx and will fax it to the [facility] prosthetic dept.

## 2017-12-26 DIAGNOSIS — Z1211 Encounter for screening for malignant neoplasm of colon: Secondary | ICD-10-CM | POA: Diagnosis not present

## 2017-12-26 DIAGNOSIS — I10 Essential (primary) hypertension: Secondary | ICD-10-CM | POA: Diagnosis not present

## 2017-12-26 DIAGNOSIS — R5383 Other fatigue: Secondary | ICD-10-CM | POA: Diagnosis not present

## 2017-12-26 DIAGNOSIS — Z23 Encounter for immunization: Secondary | ICD-10-CM | POA: Diagnosis not present

## 2017-12-26 DIAGNOSIS — Z0000 Encounter for general adult medical examination without abnormal findings: Secondary | ICD-10-CM | POA: Diagnosis not present

## 2017-12-26 DIAGNOSIS — Z1339 Encounter for screening examination for other mental health and behavioral disorders: Secondary | ICD-10-CM | POA: Diagnosis not present

## 2017-12-26 DIAGNOSIS — R42 Dizziness and giddiness: Secondary | ICD-10-CM | POA: Diagnosis not present

## 2017-12-26 DIAGNOSIS — Z7189 Other specified counseling: Secondary | ICD-10-CM | POA: Diagnosis not present

## 2017-12-26 DIAGNOSIS — E1165 Type 2 diabetes mellitus with hyperglycemia: Secondary | ICD-10-CM | POA: Diagnosis not present

## 2017-12-26 DIAGNOSIS — Z1331 Encounter for screening for depression: Secondary | ICD-10-CM | POA: Diagnosis not present

## 2018-01-08 DIAGNOSIS — H81393 Other peripheral vertigo, bilateral: Secondary | ICD-10-CM | POA: Diagnosis not present

## 2018-01-08 DIAGNOSIS — E1159 Type 2 diabetes mellitus with other circulatory complications: Secondary | ICD-10-CM | POA: Diagnosis not present

## 2018-01-08 DIAGNOSIS — E1140 Type 2 diabetes mellitus with diabetic neuropathy, unspecified: Secondary | ICD-10-CM | POA: Diagnosis not present

## 2018-01-09 DIAGNOSIS — Z125 Encounter for screening for malignant neoplasm of prostate: Secondary | ICD-10-CM | POA: Diagnosis not present

## 2018-01-09 DIAGNOSIS — Z0000 Encounter for general adult medical examination without abnormal findings: Secondary | ICD-10-CM | POA: Diagnosis not present

## 2018-01-09 DIAGNOSIS — R5383 Other fatigue: Secondary | ICD-10-CM | POA: Diagnosis not present

## 2018-01-09 DIAGNOSIS — E7800 Pure hypercholesterolemia, unspecified: Secondary | ICD-10-CM | POA: Diagnosis not present

## 2018-01-09 DIAGNOSIS — Z79899 Other long term (current) drug therapy: Secondary | ICD-10-CM | POA: Diagnosis not present

## 2018-01-14 DIAGNOSIS — I10 Essential (primary) hypertension: Secondary | ICD-10-CM | POA: Diagnosis not present

## 2018-01-14 DIAGNOSIS — J449 Chronic obstructive pulmonary disease, unspecified: Secondary | ICD-10-CM | POA: Diagnosis not present

## 2018-01-14 DIAGNOSIS — E7800 Pure hypercholesterolemia, unspecified: Secondary | ICD-10-CM | POA: Diagnosis not present

## 2018-01-16 ENCOUNTER — Encounter: Payer: Self-pay | Admitting: Podiatry

## 2018-01-16 ENCOUNTER — Ambulatory Visit: Payer: Non-veteran care | Admitting: Orthotics

## 2018-01-16 DIAGNOSIS — I739 Peripheral vascular disease, unspecified: Secondary | ICD-10-CM

## 2018-01-16 DIAGNOSIS — M2042 Other hammer toe(s) (acquired), left foot: Secondary | ICD-10-CM

## 2018-01-16 DIAGNOSIS — G5790 Unspecified mononeuropathy of unspecified lower limb: Secondary | ICD-10-CM

## 2018-01-16 DIAGNOSIS — M2010 Hallux valgus (acquired), unspecified foot: Secondary | ICD-10-CM

## 2018-01-16 DIAGNOSIS — M2041 Other hammer toe(s) (acquired), right foot: Secondary | ICD-10-CM

## 2018-01-16 NOTE — Progress Notes (Signed)

## 2018-01-21 DIAGNOSIS — I10 Essential (primary) hypertension: Secondary | ICD-10-CM | POA: Diagnosis not present

## 2018-01-21 DIAGNOSIS — E559 Vitamin D deficiency, unspecified: Secondary | ICD-10-CM | POA: Diagnosis not present

## 2018-01-21 DIAGNOSIS — Z6828 Body mass index (BMI) 28.0-28.9, adult: Secondary | ICD-10-CM | POA: Diagnosis not present

## 2018-01-21 DIAGNOSIS — Z299 Encounter for prophylactic measures, unspecified: Secondary | ICD-10-CM | POA: Diagnosis not present

## 2018-01-21 DIAGNOSIS — I739 Peripheral vascular disease, unspecified: Secondary | ICD-10-CM | POA: Diagnosis not present

## 2018-01-21 DIAGNOSIS — E538 Deficiency of other specified B group vitamins: Secondary | ICD-10-CM | POA: Diagnosis not present

## 2018-01-21 DIAGNOSIS — R42 Dizziness and giddiness: Secondary | ICD-10-CM | POA: Diagnosis not present

## 2018-01-22 DIAGNOSIS — R195 Other fecal abnormalities: Secondary | ICD-10-CM | POA: Diagnosis not present

## 2018-01-31 DIAGNOSIS — I10 Essential (primary) hypertension: Secondary | ICD-10-CM | POA: Diagnosis not present

## 2018-01-31 DIAGNOSIS — K5731 Diverticulosis of large intestine without perforation or abscess with bleeding: Secondary | ICD-10-CM | POA: Diagnosis not present

## 2018-01-31 DIAGNOSIS — Z7982 Long term (current) use of aspirin: Secondary | ICD-10-CM | POA: Diagnosis not present

## 2018-01-31 DIAGNOSIS — E1140 Type 2 diabetes mellitus with diabetic neuropathy, unspecified: Secondary | ICD-10-CM | POA: Diagnosis not present

## 2018-01-31 DIAGNOSIS — K5730 Diverticulosis of large intestine without perforation or abscess without bleeding: Secondary | ICD-10-CM | POA: Diagnosis not present

## 2018-01-31 DIAGNOSIS — M109 Gout, unspecified: Secondary | ICD-10-CM | POA: Diagnosis not present

## 2018-01-31 DIAGNOSIS — K921 Melena: Secondary | ICD-10-CM | POA: Diagnosis not present

## 2018-01-31 DIAGNOSIS — R195 Other fecal abnormalities: Secondary | ICD-10-CM | POA: Diagnosis not present

## 2018-01-31 DIAGNOSIS — Z7984 Long term (current) use of oral hypoglycemic drugs: Secondary | ICD-10-CM | POA: Diagnosis not present

## 2018-01-31 DIAGNOSIS — Z79899 Other long term (current) drug therapy: Secondary | ICD-10-CM | POA: Diagnosis not present

## 2018-02-11 DIAGNOSIS — E7800 Pure hypercholesterolemia, unspecified: Secondary | ICD-10-CM | POA: Diagnosis not present

## 2018-02-11 DIAGNOSIS — J449 Chronic obstructive pulmonary disease, unspecified: Secondary | ICD-10-CM | POA: Diagnosis not present

## 2018-02-11 DIAGNOSIS — I10 Essential (primary) hypertension: Secondary | ICD-10-CM | POA: Diagnosis not present

## 2018-02-13 ENCOUNTER — Ambulatory Visit: Payer: Non-veteran care | Admitting: Podiatry

## 2018-04-12 ENCOUNTER — Ambulatory Visit: Payer: Non-veteran care | Admitting: Podiatry

## 2018-05-07 DIAGNOSIS — I10 Essential (primary) hypertension: Secondary | ICD-10-CM | POA: Diagnosis not present

## 2018-05-07 DIAGNOSIS — E7800 Pure hypercholesterolemia, unspecified: Secondary | ICD-10-CM | POA: Diagnosis not present

## 2018-05-07 DIAGNOSIS — J449 Chronic obstructive pulmonary disease, unspecified: Secondary | ICD-10-CM | POA: Diagnosis not present

## 2018-07-02 DIAGNOSIS — I10 Essential (primary) hypertension: Secondary | ICD-10-CM | POA: Diagnosis not present

## 2018-07-02 DIAGNOSIS — J449 Chronic obstructive pulmonary disease, unspecified: Secondary | ICD-10-CM | POA: Diagnosis not present

## 2018-07-02 DIAGNOSIS — E7800 Pure hypercholesterolemia, unspecified: Secondary | ICD-10-CM | POA: Diagnosis not present

## 2018-08-02 DIAGNOSIS — I10 Essential (primary) hypertension: Secondary | ICD-10-CM | POA: Diagnosis not present

## 2018-08-02 DIAGNOSIS — E7800 Pure hypercholesterolemia, unspecified: Secondary | ICD-10-CM | POA: Diagnosis not present

## 2018-08-02 DIAGNOSIS — J449 Chronic obstructive pulmonary disease, unspecified: Secondary | ICD-10-CM | POA: Diagnosis not present

## 2018-08-28 DIAGNOSIS — Z299 Encounter for prophylactic measures, unspecified: Secondary | ICD-10-CM | POA: Diagnosis not present

## 2018-08-28 DIAGNOSIS — M5416 Radiculopathy, lumbar region: Secondary | ICD-10-CM | POA: Diagnosis not present

## 2018-08-28 DIAGNOSIS — C679 Malignant neoplasm of bladder, unspecified: Secondary | ICD-10-CM | POA: Diagnosis not present

## 2018-08-28 DIAGNOSIS — I10 Essential (primary) hypertension: Secondary | ICD-10-CM | POA: Diagnosis not present

## 2018-08-28 DIAGNOSIS — J449 Chronic obstructive pulmonary disease, unspecified: Secondary | ICD-10-CM | POA: Diagnosis not present

## 2018-08-28 DIAGNOSIS — E1165 Type 2 diabetes mellitus with hyperglycemia: Secondary | ICD-10-CM | POA: Diagnosis not present

## 2018-08-28 DIAGNOSIS — Z6828 Body mass index (BMI) 28.0-28.9, adult: Secondary | ICD-10-CM | POA: Diagnosis not present

## 2018-10-16 DIAGNOSIS — C679 Malignant neoplasm of bladder, unspecified: Secondary | ICD-10-CM | POA: Diagnosis not present

## 2018-10-16 DIAGNOSIS — Z299 Encounter for prophylactic measures, unspecified: Secondary | ICD-10-CM | POA: Diagnosis not present

## 2018-10-16 DIAGNOSIS — L723 Sebaceous cyst: Secondary | ICD-10-CM | POA: Diagnosis not present

## 2018-10-16 DIAGNOSIS — I739 Peripheral vascular disease, unspecified: Secondary | ICD-10-CM | POA: Diagnosis not present

## 2018-10-16 DIAGNOSIS — J449 Chronic obstructive pulmonary disease, unspecified: Secondary | ICD-10-CM | POA: Diagnosis not present

## 2018-10-16 DIAGNOSIS — Z6827 Body mass index (BMI) 27.0-27.9, adult: Secondary | ICD-10-CM | POA: Diagnosis not present

## 2018-10-16 DIAGNOSIS — I10 Essential (primary) hypertension: Secondary | ICD-10-CM | POA: Diagnosis not present

## 2019-01-09 DIAGNOSIS — Z23 Encounter for immunization: Secondary | ICD-10-CM | POA: Diagnosis not present

## 2019-01-14 DIAGNOSIS — Z1211 Encounter for screening for malignant neoplasm of colon: Secondary | ICD-10-CM | POA: Diagnosis not present

## 2019-01-14 DIAGNOSIS — R5383 Other fatigue: Secondary | ICD-10-CM | POA: Diagnosis not present

## 2019-01-14 DIAGNOSIS — Z79899 Other long term (current) drug therapy: Secondary | ICD-10-CM | POA: Diagnosis not present

## 2019-01-14 DIAGNOSIS — Z7189 Other specified counseling: Secondary | ICD-10-CM | POA: Diagnosis not present

## 2019-01-14 DIAGNOSIS — Z1331 Encounter for screening for depression: Secondary | ICD-10-CM | POA: Diagnosis not present

## 2019-01-14 DIAGNOSIS — E7800 Pure hypercholesterolemia, unspecified: Secondary | ICD-10-CM | POA: Diagnosis not present

## 2019-01-14 DIAGNOSIS — Z125 Encounter for screening for malignant neoplasm of prostate: Secondary | ICD-10-CM | POA: Diagnosis not present

## 2019-01-14 DIAGNOSIS — E1165 Type 2 diabetes mellitus with hyperglycemia: Secondary | ICD-10-CM | POA: Diagnosis not present

## 2019-01-14 DIAGNOSIS — Z1339 Encounter for screening examination for other mental health and behavioral disorders: Secondary | ICD-10-CM | POA: Diagnosis not present

## 2019-01-14 DIAGNOSIS — Z0000 Encounter for general adult medical examination without abnormal findings: Secondary | ICD-10-CM | POA: Diagnosis not present

## 2019-01-14 DIAGNOSIS — J449 Chronic obstructive pulmonary disease, unspecified: Secondary | ICD-10-CM | POA: Diagnosis not present

## 2019-01-14 DIAGNOSIS — I10 Essential (primary) hypertension: Secondary | ICD-10-CM | POA: Diagnosis not present

## 2019-01-24 DIAGNOSIS — J449 Chronic obstructive pulmonary disease, unspecified: Secondary | ICD-10-CM | POA: Diagnosis not present

## 2019-01-24 DIAGNOSIS — I10 Essential (primary) hypertension: Secondary | ICD-10-CM | POA: Diagnosis not present

## 2019-01-24 DIAGNOSIS — E7800 Pure hypercholesterolemia, unspecified: Secondary | ICD-10-CM | POA: Diagnosis not present

## 2019-05-06 DIAGNOSIS — E1140 Type 2 diabetes mellitus with diabetic neuropathy, unspecified: Secondary | ICD-10-CM | POA: Diagnosis not present

## 2019-05-06 DIAGNOSIS — L89892 Pressure ulcer of other site, stage 2: Secondary | ICD-10-CM | POA: Diagnosis not present

## 2019-05-06 DIAGNOSIS — E1151 Type 2 diabetes mellitus with diabetic peripheral angiopathy without gangrene: Secondary | ICD-10-CM | POA: Diagnosis not present

## 2019-05-20 DIAGNOSIS — L89892 Pressure ulcer of other site, stage 2: Secondary | ICD-10-CM | POA: Diagnosis not present

## 2019-05-20 DIAGNOSIS — E1140 Type 2 diabetes mellitus with diabetic neuropathy, unspecified: Secondary | ICD-10-CM | POA: Diagnosis not present

## 2019-05-20 DIAGNOSIS — M79672 Pain in left foot: Secondary | ICD-10-CM | POA: Diagnosis not present

## 2019-06-03 DIAGNOSIS — L89892 Pressure ulcer of other site, stage 2: Secondary | ICD-10-CM | POA: Diagnosis not present

## 2019-06-03 DIAGNOSIS — E1140 Type 2 diabetes mellitus with diabetic neuropathy, unspecified: Secondary | ICD-10-CM | POA: Diagnosis not present

## 2019-06-03 DIAGNOSIS — E1151 Type 2 diabetes mellitus with diabetic peripheral angiopathy without gangrene: Secondary | ICD-10-CM | POA: Diagnosis not present

## 2019-06-03 DIAGNOSIS — M79672 Pain in left foot: Secondary | ICD-10-CM | POA: Diagnosis not present

## 2019-06-03 DIAGNOSIS — M79671 Pain in right foot: Secondary | ICD-10-CM | POA: Diagnosis not present

## 2019-06-17 DIAGNOSIS — E1151 Type 2 diabetes mellitus with diabetic peripheral angiopathy without gangrene: Secondary | ICD-10-CM | POA: Diagnosis not present

## 2019-06-17 DIAGNOSIS — M79672 Pain in left foot: Secondary | ICD-10-CM | POA: Diagnosis not present

## 2019-06-17 DIAGNOSIS — E1140 Type 2 diabetes mellitus with diabetic neuropathy, unspecified: Secondary | ICD-10-CM | POA: Diagnosis not present

## 2019-06-17 DIAGNOSIS — M79671 Pain in right foot: Secondary | ICD-10-CM | POA: Diagnosis not present

## 2019-06-17 DIAGNOSIS — L89892 Pressure ulcer of other site, stage 2: Secondary | ICD-10-CM | POA: Diagnosis not present

## 2019-06-19 DIAGNOSIS — E7800 Pure hypercholesterolemia, unspecified: Secondary | ICD-10-CM | POA: Diagnosis not present

## 2019-06-19 DIAGNOSIS — I10 Essential (primary) hypertension: Secondary | ICD-10-CM | POA: Diagnosis not present

## 2019-06-19 DIAGNOSIS — J449 Chronic obstructive pulmonary disease, unspecified: Secondary | ICD-10-CM | POA: Diagnosis not present

## 2019-07-23 DIAGNOSIS — E1165 Type 2 diabetes mellitus with hyperglycemia: Secondary | ICD-10-CM | POA: Diagnosis not present

## 2019-07-23 DIAGNOSIS — Z299 Encounter for prophylactic measures, unspecified: Secondary | ICD-10-CM | POA: Diagnosis not present

## 2019-07-23 DIAGNOSIS — J449 Chronic obstructive pulmonary disease, unspecified: Secondary | ICD-10-CM | POA: Diagnosis not present

## 2019-07-23 DIAGNOSIS — N1831 Chronic kidney disease, stage 3a: Secondary | ICD-10-CM | POA: Diagnosis not present

## 2019-07-23 DIAGNOSIS — I739 Peripheral vascular disease, unspecified: Secondary | ICD-10-CM | POA: Diagnosis not present

## 2019-07-23 DIAGNOSIS — I10 Essential (primary) hypertension: Secondary | ICD-10-CM | POA: Diagnosis not present

## 2019-08-17 DIAGNOSIS — J449 Chronic obstructive pulmonary disease, unspecified: Secondary | ICD-10-CM | POA: Diagnosis not present

## 2019-08-17 DIAGNOSIS — I10 Essential (primary) hypertension: Secondary | ICD-10-CM | POA: Diagnosis not present

## 2019-08-17 DIAGNOSIS — E7800 Pure hypercholesterolemia, unspecified: Secondary | ICD-10-CM | POA: Diagnosis not present

## 2019-09-17 DIAGNOSIS — J449 Chronic obstructive pulmonary disease, unspecified: Secondary | ICD-10-CM | POA: Diagnosis not present

## 2019-09-17 DIAGNOSIS — E7800 Pure hypercholesterolemia, unspecified: Secondary | ICD-10-CM | POA: Diagnosis not present

## 2019-09-17 DIAGNOSIS — I10 Essential (primary) hypertension: Secondary | ICD-10-CM | POA: Diagnosis not present

## 2019-09-29 ENCOUNTER — Encounter (HOSPITAL_COMMUNITY): Payer: Self-pay

## 2019-09-29 ENCOUNTER — Inpatient Hospital Stay (HOSPITAL_COMMUNITY)
Admission: EM | Admit: 2019-09-29 | Discharge: 2019-10-09 | DRG: 617 | Disposition: A | Discharge status: Skilled Nursing Facility | Payer: No Typology Code available for payment source | Attending: Internal Medicine | Admitting: Internal Medicine

## 2019-09-29 ENCOUNTER — Emergency Department (HOSPITAL_COMMUNITY): Payer: No Typology Code available for payment source

## 2019-09-29 ENCOUNTER — Other Ambulatory Visit: Payer: Self-pay

## 2019-09-29 DIAGNOSIS — L97529 Non-pressure chronic ulcer of other part of left foot with unspecified severity: Secondary | ICD-10-CM | POA: Diagnosis present

## 2019-09-29 DIAGNOSIS — M86672 Other chronic osteomyelitis, left ankle and foot: Secondary | ICD-10-CM | POA: Diagnosis present

## 2019-09-29 DIAGNOSIS — M869 Osteomyelitis, unspecified: Secondary | ICD-10-CM | POA: Diagnosis present

## 2019-09-29 DIAGNOSIS — Z20822 Contact with and (suspected) exposure to covid-19: Secondary | ICD-10-CM | POA: Diagnosis present

## 2019-09-29 DIAGNOSIS — I2510 Atherosclerotic heart disease of native coronary artery without angina pectoris: Secondary | ICD-10-CM | POA: Diagnosis present

## 2019-09-29 DIAGNOSIS — N179 Acute kidney failure, unspecified: Secondary | ICD-10-CM | POA: Diagnosis not present

## 2019-09-29 DIAGNOSIS — T33832S Superficial frostbite of left toe(s), sequela: Secondary | ICD-10-CM

## 2019-09-29 DIAGNOSIS — Z03818 Encounter for observation for suspected exposure to other biological agents ruled out: Secondary | ICD-10-CM | POA: Diagnosis not present

## 2019-09-29 DIAGNOSIS — Z7982 Long term (current) use of aspirin: Secondary | ICD-10-CM

## 2019-09-29 DIAGNOSIS — M109 Gout, unspecified: Secondary | ICD-10-CM | POA: Diagnosis present

## 2019-09-29 DIAGNOSIS — I5022 Chronic systolic (congestive) heart failure: Secondary | ICD-10-CM | POA: Diagnosis present

## 2019-09-29 DIAGNOSIS — D649 Anemia, unspecified: Secondary | ICD-10-CM | POA: Diagnosis present

## 2019-09-29 DIAGNOSIS — R0609 Other forms of dyspnea: Secondary | ICD-10-CM

## 2019-09-29 DIAGNOSIS — I739 Peripheral vascular disease, unspecified: Secondary | ICD-10-CM | POA: Diagnosis present

## 2019-09-29 DIAGNOSIS — E1169 Type 2 diabetes mellitus with other specified complication: Principal | ICD-10-CM | POA: Diagnosis present

## 2019-09-29 DIAGNOSIS — N1831 Chronic kidney disease, stage 3a: Secondary | ICD-10-CM | POA: Diagnosis present

## 2019-09-29 DIAGNOSIS — K5900 Constipation, unspecified: Secondary | ICD-10-CM | POA: Diagnosis not present

## 2019-09-29 DIAGNOSIS — M79672 Pain in left foot: Secondary | ICD-10-CM | POA: Diagnosis not present

## 2019-09-29 DIAGNOSIS — I447 Left bundle-branch block, unspecified: Secondary | ICD-10-CM | POA: Diagnosis present

## 2019-09-29 DIAGNOSIS — E1151 Type 2 diabetes mellitus with diabetic peripheral angiopathy without gangrene: Secondary | ICD-10-CM | POA: Diagnosis present

## 2019-09-29 DIAGNOSIS — N400 Enlarged prostate without lower urinary tract symptoms: Secondary | ICD-10-CM | POA: Diagnosis present

## 2019-09-29 DIAGNOSIS — E119 Type 2 diabetes mellitus without complications: Secondary | ICD-10-CM

## 2019-09-29 DIAGNOSIS — N183 Chronic kidney disease, stage 3 unspecified: Secondary | ICD-10-CM

## 2019-09-29 DIAGNOSIS — I130 Hypertensive heart and chronic kidney disease with heart failure and stage 1 through stage 4 chronic kidney disease, or unspecified chronic kidney disease: Secondary | ICD-10-CM | POA: Diagnosis present

## 2019-09-29 DIAGNOSIS — M86172 Other acute osteomyelitis, left ankle and foot: Secondary | ICD-10-CM | POA: Diagnosis not present

## 2019-09-29 DIAGNOSIS — R0600 Dyspnea, unspecified: Secondary | ICD-10-CM | POA: Diagnosis not present

## 2019-09-29 DIAGNOSIS — E785 Hyperlipidemia, unspecified: Secondary | ICD-10-CM | POA: Diagnosis present

## 2019-09-29 DIAGNOSIS — Z8249 Family history of ischemic heart disease and other diseases of the circulatory system: Secondary | ICD-10-CM

## 2019-09-29 DIAGNOSIS — Z87891 Personal history of nicotine dependence: Secondary | ICD-10-CM

## 2019-09-29 DIAGNOSIS — E11621 Type 2 diabetes mellitus with foot ulcer: Secondary | ICD-10-CM | POA: Diagnosis present

## 2019-09-29 DIAGNOSIS — E1159 Type 2 diabetes mellitus with other circulatory complications: Secondary | ICD-10-CM | POA: Diagnosis present

## 2019-09-29 DIAGNOSIS — Z8551 Personal history of malignant neoplasm of bladder: Secondary | ICD-10-CM

## 2019-09-29 DIAGNOSIS — M7989 Other specified soft tissue disorders: Secondary | ICD-10-CM | POA: Diagnosis not present

## 2019-09-29 DIAGNOSIS — X31XXXS Exposure to excessive natural cold, sequela: Secondary | ICD-10-CM

## 2019-09-29 DIAGNOSIS — G629 Polyneuropathy, unspecified: Secondary | ICD-10-CM | POA: Diagnosis present

## 2019-09-29 DIAGNOSIS — L03032 Cellulitis of left toe: Secondary | ICD-10-CM | POA: Diagnosis present

## 2019-09-29 DIAGNOSIS — Z7984 Long term (current) use of oral hypoglycemic drugs: Secondary | ICD-10-CM

## 2019-09-29 DIAGNOSIS — I152 Hypertension secondary to endocrine disorders: Secondary | ICD-10-CM | POA: Diagnosis present

## 2019-09-29 DIAGNOSIS — Z66 Do not resuscitate: Secondary | ICD-10-CM | POA: Diagnosis present

## 2019-09-29 DIAGNOSIS — Z79899 Other long term (current) drug therapy: Secondary | ICD-10-CM

## 2019-09-29 HISTORY — DX: Type 2 diabetes mellitus without complications: E11.9

## 2019-09-29 LAB — COMPREHENSIVE METABOLIC PANEL
ALT: 20 U/L (ref 0–44)
AST: 21 U/L (ref 15–41)
Albumin: 3.5 g/dL (ref 3.5–5.0)
Alkaline Phosphatase: 79 U/L (ref 38–126)
Anion gap: 12 (ref 5–15)
BUN: 24 mg/dL — ABNORMAL HIGH (ref 8–23)
CO2: 24 mmol/L (ref 22–32)
Calcium: 9.3 mg/dL (ref 8.9–10.3)
Chloride: 99 mmol/L (ref 98–111)
Creatinine, Ser: 1.57 mg/dL — ABNORMAL HIGH (ref 0.61–1.24)
GFR calc Af Amer: 44 mL/min — ABNORMAL LOW (ref >60)
GFR calc non Af Amer: 38 mL/min — ABNORMAL LOW (ref >60)
Glucose, Bld: 200 mg/dL — ABNORMAL HIGH (ref 70–99)
Potassium: 4.5 mmol/L (ref 3.5–5.1)
Sodium: 135 mmol/L (ref 135–145)
Total Bilirubin: 0.3 mg/dL (ref 0.3–1.2)
Total Protein: 7.7 g/dL (ref 6.5–8.1)

## 2019-09-29 LAB — CBC WITH DIFFERENTIAL/PLATELET
Abs Immature Granulocytes: 0.06 10*3/uL (ref 0.00–0.07)
Basophils Absolute: 0 10*3/uL (ref 0.0–0.1)
Basophils Relative: 0 %
Eosinophils Absolute: 0.1 10*3/uL (ref 0.0–0.5)
Eosinophils Relative: 2 %
HCT: 39.1 % (ref 39.0–52.0)
Hemoglobin: 12.6 g/dL — ABNORMAL LOW (ref 13.0–17.0)
Immature Granulocytes: 1 %
Lymphocytes Relative: 24 %
Lymphs Abs: 1.9 10*3/uL (ref 0.7–4.0)
MCH: 29.3 pg (ref 26.0–34.0)
MCHC: 32.2 g/dL (ref 30.0–36.0)
MCV: 90.9 fL (ref 80.0–100.0)
Monocytes Absolute: 0.6 10*3/uL (ref 0.1–1.0)
Monocytes Relative: 8 %
Neutro Abs: 5.1 10*3/uL (ref 1.7–7.7)
Neutrophils Relative %: 65 %
Platelets: 323 10*3/uL (ref 150–400)
RBC: 4.3 MIL/uL (ref 4.22–5.81)
RDW: 13.8 % (ref 11.5–15.5)
WBC: 7.8 10*3/uL (ref 4.0–10.5)
nRBC: 0 % (ref 0.0–0.2)

## 2019-09-29 MED ORDER — PIPERACILLIN-TAZOBACTAM 3.375 G IVPB 30 MIN: 3.3750 g | Freq: Once | INTRAVENOUS | Status: DC

## 2019-09-29 NOTE — ED Provider Notes (Signed)
TIME SEEN: 11:55 PM  CHIEF COMPLAINT: Left great toe infection  HPI: Patient is a 86 year old male with history of peripheral arterial disease, hypertension, hyperlipidemia who presents to the emergency department with concerns of infection of the left great toe.  Reports that he noticed a wound to this area this progressively worsened for 3 weeks.  Was sent here from [facility] for concerns for osteomyelitis.  He denies fevers, chest pain, shortness of breath, nausea, vomiting, diarrhea.  States that he is not having any pain in this toe but states that he has peripheral neuropathy due to injuries from the [place] War.  He has not been on antibiotics.  ROS: See HPI Constitutional: no fever  Eyes: no drainage  ENT: no runny nose   Cardiovascular:  no chest pain  Resp: no SOB  GI: no vomiting GU: no dysuria Integumentary: no rash  Allergy: no hives  Musculoskeletal: no leg swelling  Neurological: no slurred speech ROS otherwise negative  PAST MEDICAL HISTORY/PAST SURGICAL HISTORY:  Past Medical History:  Diagnosis Date  . Acute lower GI bleeding    Recurrent: 2011, 2012  . Bladder cancer ([place])    Transitional cell, 2001  . CAD (coronary atherosclerotic disease)    Nonobstructive, 05/2012  . Exertional dyspnea    EF 19-50%, grade 1 diastolic dysfunction, 11/3265  . HLD (hyperlipidemia)   . HTN (hypertension)   . Neuropathy   . Other insomnia   . PAD (peripheral artery disease) ([place])    ABIs, 2009: 0.89 right; 0.99 left  . Valvular heart disease   . Vertigo     MEDICATIONS:  Prior to Admission medications   Medication Sig Start Date End Date Taking? Authorizing Provider  allopurinol (ZYLOPRIM) 300 MG tablet Take 300 mg by mouth as needed.    [provider]  amLODipine (NORVASC) 10 MG tablet Take 1 tablet (10 mg total) by mouth daily. 06/03/12   [name], [name], PA-C  amLODipine-benazepril (LOTREL) 10-20 MG capsule Take by mouth.    [provider]   ammonium lactate (LAC-HYDRIN) 12 % cream Apply topically as needed for dry skin. 11/14/17   Gardiner Barefoot, DPM  aspirin 81 MG tablet Take 81 mg by mouth daily.    [provider]  atorvastatin (LIPITOR) 10 MG tablet Take 1 tablet (10 mg total) by mouth every evening. 10/17/12   [name], [name], PA-C  clonazePAM (KLONOPIN) 2 MG tablet Take 2 mg by mouth at bedtime as needed for anxiety.    [provider]  lisinopril (PRINIVIL,ZESTRIL) 20 MG tablet Take 20 mg by mouth daily.    [provider]  losartan (COZAAR) 100 MG tablet Take 100 mg by mouth daily.    [provider]  meclizine (ANTIVERT) 25 MG tablet Take 25 mg by mouth as needed.    [provider]  metFORMIN (GLUCOPHAGE) 500 MG tablet Take by mouth 2 (two) times daily with a meal.    [provider]  nortriptyline (PAMELOR) 10 MG capsule Take 10 mg by mouth at bedtime.    [provider]  tamsulosin (FLOMAX) 0.4 MG CAPS capsule Take 0.4 mg by mouth.    [provider]    ALLERGIES:  No Known Allergies  SOCIAL HISTORY:  Social History   Tobacco Use  . Smoking status: Former Smoker    Quit date: 04/19/1972    Years since quitting: 47.4  . Smokeless tobacco: Never Used  Substance Use Topics  . Alcohol use: Never  FAMILY HISTORY: Family History  Problem Relation Age of Onset  . Heart attack Mother   . Heart attack Father   . Heart attack Sister   . Heart attack Sister   . Heart attack Sister   . Heart attack Sister     EXAM: BP (!) 157/86   Pulse 85   Temp (!) 97.4 F (36.3 C) (Oral)   Resp 18   Ht 6\' 1"  (1.854 m)   Wt 98.4 kg   SpO2 97%   BMI 28.63 kg/m  CONSTITUTIONAL: Alert and oriented and responds appropriately to questions. Well-appearing; well-nourished HEAD: Normocephalic EYES: Conjunctivae clear, pupils appear equal, EOM appear intact ENT: normal nose; moist mucous membranes NECK: Supple, normal ROM CARD: RRR; S1 and S2  appreciated; no murmurs, no clicks, no rubs, no gallops RESP: Normal chest excursion without splinting or tachypnea; breath sounds clear and equal bilaterally; no wheezes, no rhonchi, no rales, no hypoxia or respiratory distress, speaking full sentences ABD/GI: Normal bowel sounds; non-distended; soft, non-tender, no rebound, no guarding, no peritoneal signs, no hepatosplenomegaly BACK:  The back appears normal EXT: Normal ROM in all joints; no deformity noted, no edema; no cyanosis, +1 DP pulse palpated in the left dorsal foot, wound to the left great toe with some foul-smelling drainage without significant surrounding redness or warmth and normal capillary refill (please see pictures below) SKIN: Normal color for age and race; warm; no rash on exposed skin NEURO: Moves all extremities equally PSYCH: The patient's mood and manner are appropriate.   MEDICAL DECISION MAKING: Patient here with concerns for osteomyelitis of the left great toe.  No systemic symptoms.  X-ray shows bony destruction of the tuft of the distal first phalanx.  Will start broad-spectrum antibiotics and discuss with medicine for admission.  ED PROGRESS: 12:32 AM Discussed patient's case with HOSPITALIST, Dr. Posey Pronto.  I have recommended admission and patient (and family if present) agree with this plan. Admitting physician will place admission orders.   I reviewed all nursing notes, vitals, pertinent previous records and reviewed/interpreted all EKGs, lab and urine results, imaging (as available).        Patient gave verbal permission to utilize photo for medical documentation only. The image was not stored on any personal device.    CRITICAL CARE Performed by: Pryor Curia   Total critical care time: 45 minutes  Critical care time was exclusive of separately billable procedures and treating other patients.  Critical care was necessary to treat or prevent imminent or life-threatening deterioration.  Critical  care was time spent personally by me on the following activities: development of treatment plan with patient and/or surrogate as well as nursing, discussions with consultants, evaluation of patient's response to treatment, examination of patient, obtaining history from patient or surrogate, ordering and performing treatments and interventions, ordering and review of laboratory studies, ordering and review of radiographic studies, pulse oximetry and re-evaluation of patient's condition.   Peter James. was evaluated in Emergency Department on 09/29/2019 for the symptoms described in the history of present illness. He was evaluated in the context of the global COVID-19 pandemic, which necessitated consideration that the patient might be at risk for infection with the SARS-CoV-2 virus that causes COVID-19. Institutional protocols and algorithms that pertain to the evaluation of patients at risk for COVID-19 are in a state of rapid change based on information released by regulatory bodies including the CDC and federal and state organizations. These policies and algorithms were followed during the patient's care in  the ED.      Peter James, Delice Bison, DO 09/30/19 [phone]

## 2019-09-29 NOTE — ED Triage Notes (Signed)
Pt presents with Left foot pain x3 weeks, seen at [facility] in [place], they sent him here to be evaluated and possibly admitted for osteomyelitis. Pt denies any pain. He presents with a orthopedic shoe.

## 2019-09-30 ENCOUNTER — Other Ambulatory Visit: Payer: Self-pay | Admitting: Physician Assistant

## 2019-09-30 ENCOUNTER — Telehealth: Payer: Self-pay | Admitting: Orthopedic Surgery

## 2019-09-30 ENCOUNTER — Inpatient Hospital Stay (HOSPITAL_COMMUNITY): Payer: No Typology Code available for payment source

## 2019-09-30 ENCOUNTER — Encounter (HOSPITAL_COMMUNITY): Payer: Self-pay | Admitting: Internal Medicine

## 2019-09-30 DIAGNOSIS — N1831 Chronic kidney disease, stage 3a: Secondary | ICD-10-CM | POA: Diagnosis not present

## 2019-09-30 DIAGNOSIS — I5022 Chronic systolic (congestive) heart failure: Secondary | ICD-10-CM | POA: Diagnosis not present

## 2019-09-30 DIAGNOSIS — M6281 Muscle weakness (generalized): Secondary | ICD-10-CM | POA: Diagnosis not present

## 2019-09-30 DIAGNOSIS — M109 Gout, unspecified: Secondary | ICD-10-CM | POA: Diagnosis present

## 2019-09-30 DIAGNOSIS — K5900 Constipation, unspecified: Secondary | ICD-10-CM | POA: Diagnosis not present

## 2019-09-30 DIAGNOSIS — I447 Left bundle-branch block, unspecified: Secondary | ICD-10-CM | POA: Diagnosis present

## 2019-09-30 DIAGNOSIS — Z4801 Encounter for change or removal of surgical wound dressing: Secondary | ICD-10-CM | POA: Diagnosis not present

## 2019-09-30 DIAGNOSIS — L97529 Non-pressure chronic ulcer of other part of left foot with unspecified severity: Secondary | ICD-10-CM | POA: Diagnosis present

## 2019-09-30 DIAGNOSIS — N179 Acute kidney failure, unspecified: Secondary | ICD-10-CM | POA: Diagnosis not present

## 2019-09-30 DIAGNOSIS — E1140 Type 2 diabetes mellitus with diabetic neuropathy, unspecified: Secondary | ICD-10-CM | POA: Diagnosis not present

## 2019-09-30 DIAGNOSIS — E119 Type 2 diabetes mellitus without complications: Secondary | ICD-10-CM

## 2019-09-30 DIAGNOSIS — N183 Chronic kidney disease, stage 3 unspecified: Secondary | ICD-10-CM

## 2019-09-30 DIAGNOSIS — R21 Rash and other nonspecific skin eruption: Secondary | ICD-10-CM | POA: Diagnosis not present

## 2019-09-30 DIAGNOSIS — L0390 Cellulitis, unspecified: Secondary | ICD-10-CM

## 2019-09-30 DIAGNOSIS — Z8551 Personal history of malignant neoplasm of bladder: Secondary | ICD-10-CM | POA: Diagnosis not present

## 2019-09-30 DIAGNOSIS — I700 Atherosclerosis of aorta: Secondary | ICD-10-CM | POA: Diagnosis not present

## 2019-09-30 DIAGNOSIS — I739 Peripheral vascular disease, unspecified: Secondary | ICD-10-CM | POA: Diagnosis not present

## 2019-09-30 DIAGNOSIS — I130 Hypertensive heart and chronic kidney disease with heart failure and stage 1 through stage 4 chronic kidney disease, or unspecified chronic kidney disease: Secondary | ICD-10-CM | POA: Diagnosis not present

## 2019-09-30 DIAGNOSIS — I517 Cardiomegaly: Secondary | ICD-10-CM | POA: Diagnosis not present

## 2019-09-30 DIAGNOSIS — S98119S Complete traumatic amputation of unspecified great toe, sequela: Secondary | ICD-10-CM | POA: Diagnosis not present

## 2019-09-30 DIAGNOSIS — E1151 Type 2 diabetes mellitus with diabetic peripheral angiopathy without gangrene: Secondary | ICD-10-CM | POA: Diagnosis not present

## 2019-09-30 DIAGNOSIS — R5381 Other malaise: Secondary | ICD-10-CM | POA: Diagnosis not present

## 2019-09-30 DIAGNOSIS — R0600 Dyspnea, unspecified: Secondary | ICD-10-CM | POA: Diagnosis not present

## 2019-09-30 DIAGNOSIS — I2510 Atherosclerotic heart disease of native coronary artery without angina pectoris: Secondary | ICD-10-CM | POA: Diagnosis not present

## 2019-09-30 DIAGNOSIS — I5031 Acute diastolic (congestive) heart failure: Secondary | ICD-10-CM | POA: Diagnosis not present

## 2019-09-30 DIAGNOSIS — M869 Osteomyelitis, unspecified: Secondary | ICD-10-CM | POA: Diagnosis present

## 2019-09-30 DIAGNOSIS — Z7984 Long term (current) use of oral hypoglycemic drugs: Secondary | ICD-10-CM | POA: Diagnosis not present

## 2019-09-30 DIAGNOSIS — M86172 Other acute osteomyelitis, left ankle and foot: Secondary | ICD-10-CM | POA: Diagnosis not present

## 2019-09-30 DIAGNOSIS — N400 Enlarged prostate without lower urinary tract symptoms: Secondary | ICD-10-CM | POA: Diagnosis present

## 2019-09-30 DIAGNOSIS — I10 Essential (primary) hypertension: Secondary | ICD-10-CM | POA: Diagnosis not present

## 2019-09-30 DIAGNOSIS — D649 Anemia, unspecified: Secondary | ICD-10-CM | POA: Diagnosis present

## 2019-09-30 DIAGNOSIS — Z7401 Bed confinement status: Secondary | ICD-10-CM | POA: Diagnosis not present

## 2019-09-30 DIAGNOSIS — T33832S Superficial frostbite of left toe(s), sequela: Secondary | ICD-10-CM | POA: Diagnosis not present

## 2019-09-30 DIAGNOSIS — M86672 Other chronic osteomyelitis, left ankle and foot: Secondary | ICD-10-CM | POA: Diagnosis not present

## 2019-09-30 DIAGNOSIS — L03032 Cellulitis of left toe: Secondary | ICD-10-CM | POA: Diagnosis present

## 2019-09-30 DIAGNOSIS — E11621 Type 2 diabetes mellitus with foot ulcer: Secondary | ICD-10-CM | POA: Diagnosis present

## 2019-09-30 DIAGNOSIS — R52 Pain, unspecified: Secondary | ICD-10-CM | POA: Diagnosis not present

## 2019-09-30 DIAGNOSIS — G629 Polyneuropathy, unspecified: Secondary | ICD-10-CM | POA: Diagnosis not present

## 2019-09-30 DIAGNOSIS — Z20822 Contact with and (suspected) exposure to covid-19: Secondary | ICD-10-CM | POA: Diagnosis not present

## 2019-09-30 DIAGNOSIS — E1169 Type 2 diabetes mellitus with other specified complication: Secondary | ICD-10-CM | POA: Diagnosis not present

## 2019-09-30 DIAGNOSIS — M2550 Pain in unspecified joint: Secondary | ICD-10-CM | POA: Diagnosis not present

## 2019-09-30 DIAGNOSIS — R531 Weakness: Secondary | ICD-10-CM | POA: Diagnosis not present

## 2019-09-30 DIAGNOSIS — Z66 Do not resuscitate: Secondary | ICD-10-CM | POA: Diagnosis not present

## 2019-09-30 DIAGNOSIS — I152 Hypertension secondary to endocrine disorders: Secondary | ICD-10-CM | POA: Diagnosis not present

## 2019-09-30 DIAGNOSIS — E785 Hyperlipidemia, unspecified: Secondary | ICD-10-CM | POA: Diagnosis not present

## 2019-09-30 DIAGNOSIS — X31XXXS Exposure to excessive natural cold, sequela: Secondary | ICD-10-CM | POA: Diagnosis not present

## 2019-09-30 LAB — BASIC METABOLIC PANEL
Anion gap: 11 (ref 5–15)
BUN: 23 mg/dL (ref 8–23)
CO2: 25 mmol/L (ref 22–32)
Calcium: 9.7 mg/dL (ref 8.9–10.3)
Chloride: 100 mmol/L (ref 98–111)
Creatinine, Ser: 1.33 mg/dL — ABNORMAL HIGH (ref 0.61–1.24)
GFR calc Af Amer: 53 mL/min — ABNORMAL LOW (ref >60)
GFR calc non Af Amer: 46 mL/min — ABNORMAL LOW (ref >60)
Glucose, Bld: 95 mg/dL (ref 70–99)
Potassium: 5.1 mmol/L (ref 3.5–5.1)
Sodium: 136 mmol/L (ref 135–145)

## 2019-09-30 LAB — TYPE AND SCREEN
ABO/RH(D): O POS
Antibody Screen: NEGATIVE

## 2019-09-30 LAB — SURGICAL PCR SCREEN
MRSA, PCR: NEGATIVE
Staphylococcus aureus: NEGATIVE

## 2019-09-30 LAB — HEMOGLOBIN A1C
Hgb A1c MFr Bld: 6.9 % — ABNORMAL HIGH (ref 4.8–5.6)
Mean Plasma Glucose: 151.33 mg/dL

## 2019-09-30 LAB — CBC
HCT: 41.1 % (ref 39.0–52.0)
Hemoglobin: 13.4 g/dL (ref 13.0–17.0)
MCH: 29.6 pg (ref 26.0–34.0)
MCHC: 32.6 g/dL (ref 30.0–36.0)
MCV: 90.9 fL (ref 80.0–100.0)
Platelets: 356 10*3/uL (ref 150–400)
RBC: 4.52 MIL/uL (ref 4.22–5.81)
RDW: 13.5 % (ref 11.5–15.5)
WBC: 5.7 10*3/uL (ref 4.0–10.5)
nRBC: 0 % (ref 0.0–0.2)

## 2019-09-30 LAB — SARS CORONAVIRUS 2 BY RT PCR (HOSPITAL ORDER, PERFORMED IN ~~LOC~~ HOSPITAL LAB): SARS Coronavirus 2: NEGATIVE

## 2019-09-30 LAB — ECHOCARDIOGRAM COMPLETE
Height: 73 in
Weight: 3472 oz

## 2019-09-30 LAB — PROTIME-INR
INR: 1 (ref 0.8–1.2)
Prothrombin Time: 13 seconds (ref 11.4–15.2)

## 2019-09-30 LAB — BRAIN NATRIURETIC PEPTIDE: B Natriuretic Peptide: 57.3 pg/mL (ref 0.0–100.0)

## 2019-09-30 LAB — CBG MONITORING, ED
Glucose-Capillary: 95 mg/dL (ref 70–99)
Glucose-Capillary: 98 mg/dL (ref 70–99)
Glucose-Capillary: 101 mg/dL — ABNORMAL HIGH (ref 70–99)
Glucose-Capillary: 83 mg/dL (ref 70–99)
Glucose-Capillary: 124 mg/dL — ABNORMAL HIGH (ref 70–99)

## 2019-09-30 LAB — ABO/RH: ABO/RH(D): O POS

## 2019-09-30 LAB — PROCALCITONIN: Procalcitonin: <0.1 ng/mL

## 2019-09-30 LAB — GLUCOSE, CAPILLARY: Glucose-Capillary: 98 mg/dL (ref 70–99)

## 2019-09-30 MED ORDER — CEFAZOLIN SODIUM-DEXTROSE 2-4 GM/100ML-% IV SOLN
2.0000 g | INTRAVENOUS | Status: AC
  Administered 2019-10-01: 2 g via INTRAVENOUS
  Filled 2019-09-30: qty 100

## 2019-09-30 MED ORDER — AMLODIPINE BESYLATE 10 MG PO TABS
10.0000 mg | ORAL_TABLET | Freq: Every day | ORAL | Status: DC
  Administered 2019-09-30 – 2019-10-09 (×10): 10 mg via ORAL
  Filled 2019-09-30 (×3): qty 1
  Filled 2019-09-30: qty 2
  Filled 2019-09-30 (×6): qty 1

## 2019-09-30 MED ORDER — ONDANSETRON HCL 4 MG PO TABS: 4.0000 mg | ORAL_TABLET | Freq: Four times a day (QID) | ORAL | Status: DC | PRN

## 2019-09-30 MED ORDER — ACETAMINOPHEN 325 MG PO TABS
650.0000 mg | ORAL_TABLET | Freq: Four times a day (QID) | ORAL | Status: DC | PRN
  Administered 2019-09-30 – 2019-10-03 (×4): 650 mg via ORAL
  Filled 2019-09-30 (×4): qty 2

## 2019-09-30 MED ORDER — VANCOMYCIN HCL 2000 MG/400ML IV SOLN
2000.0000 mg | Freq: Once | INTRAVENOUS | Status: DC
  Filled 2019-09-30: qty 400

## 2019-09-30 MED ORDER — ONDANSETRON HCL 4 MG/2ML IJ SOLN: 4.0000 mg | Freq: Four times a day (QID) | INTRAMUSCULAR | Status: DC | PRN

## 2019-09-30 MED ORDER — HEPARIN SODIUM (PORCINE) 5000 UNIT/ML IJ SOLN
5000.0000 [IU] | Freq: Three times a day (TID) | INTRAMUSCULAR | Status: DC
  Administered 2019-09-30 – 2019-10-09 (×26): 5000 [IU] via SUBCUTANEOUS
  Filled 2019-09-30 (×26): qty 1

## 2019-09-30 MED ORDER — ALBUTEROL SULFATE (2.5 MG/3ML) 0.083% IN NEBU: 3.0000 mL | INHALATION_SOLUTION | Freq: Four times a day (QID) | RESPIRATORY_TRACT | Status: DC | PRN

## 2019-09-30 MED ORDER — ENSURE PRE-SURGERY PO LIQD
296.0000 mL | Freq: Once | ORAL | Status: AC
  Administered 2019-09-30: 296 mL via ORAL
  Filled 2019-09-30: qty 296

## 2019-09-30 MED ORDER — HYDROCODONE-ACETAMINOPHEN 5-325 MG PO TABS
1.0000 | ORAL_TABLET | ORAL | Status: DC | PRN
  Administered 2019-10-06 – 2019-10-07 (×2): 1 via ORAL
  Filled 2019-09-30 (×2): qty 1

## 2019-09-30 MED ORDER — ATORVASTATIN CALCIUM 10 MG PO TABS
10.0000 mg | ORAL_TABLET | Freq: Every evening | ORAL | Status: DC
  Administered 2019-10-01 – 2019-10-08 (×8): 10 mg via ORAL
  Filled 2019-09-30 (×8): qty 1

## 2019-09-30 MED ORDER — VANCOMYCIN HCL 2000 MG/400ML IV SOLN
2000.0000 mg | Freq: Once | INTRAVENOUS | Status: AC
  Administered 2019-09-30: 2000 mg via INTRAVENOUS
  Filled 2019-09-30: qty 400

## 2019-09-30 MED ORDER — TAMSULOSIN HCL 0.4 MG PO CAPS
0.4000 mg | ORAL_CAPSULE | Freq: Every day | ORAL | Status: DC
  Administered 2019-09-30 – 2019-10-09 (×10): 0.4 mg via ORAL
  Filled 2019-09-30 (×10): qty 1

## 2019-09-30 MED ORDER — ACETAMINOPHEN 650 MG RE SUPP: 650.0000 mg | Freq: Four times a day (QID) | RECTAL | Status: DC | PRN

## 2019-09-30 MED ORDER — VANCOMYCIN HCL 750 MG/150ML IV SOLN
750.0000 mg | Freq: Two times a day (BID) | INTRAVENOUS | Status: DC
  Administered 2019-09-30 – 2019-10-05 (×10): 750 mg via INTRAVENOUS
  Filled 2019-09-30 (×11): qty 150

## 2019-09-30 MED ORDER — HYDRALAZINE HCL 20 MG/ML IJ SOLN: 10.0000 mg | Freq: Four times a day (QID) | INTRAMUSCULAR | Status: DC | PRN

## 2019-09-30 MED ORDER — ALLOPURINOL 300 MG PO TABS
300.0000 mg | ORAL_TABLET | Freq: Every day | ORAL | Status: DC
  Administered 2019-09-30 – 2019-10-09 (×10): 300 mg via ORAL
  Filled 2019-09-30 (×10): qty 1

## 2019-09-30 MED ORDER — CARVEDILOL 3.125 MG PO TABS
3.1250 mg | ORAL_TABLET | Freq: Two times a day (BID) | ORAL | Status: DC
  Administered 2019-10-01 – 2019-10-09 (×17): 3.125 mg via ORAL
  Filled 2019-09-30 (×17): qty 1

## 2019-09-30 MED ORDER — SENNOSIDES-DOCUSATE SODIUM 8.6-50 MG PO TABS
1.0000 | ORAL_TABLET | Freq: Every evening | ORAL | Status: DC | PRN
  Administered 2019-10-01: 1 via ORAL
  Filled 2019-09-30: qty 1

## 2019-09-30 MED ORDER — SODIUM CHLORIDE 0.9 % IV SOLN
2.0000 g | Freq: Two times a day (BID) | INTRAVENOUS | Status: DC
  Administered 2019-09-30 – 2019-10-05 (×12): 2 g via INTRAVENOUS
  Filled 2019-09-30 (×12): qty 2

## 2019-09-30 MED ORDER — ASPIRIN EC 81 MG PO TBEC
81.0000 mg | DELAYED_RELEASE_TABLET | Freq: Every day | ORAL | Status: DC
  Administered 2019-09-30 – 2019-10-09 (×9): 81 mg via ORAL
  Filled 2019-09-30 (×9): qty 1

## 2019-09-30 MED ORDER — INSULIN ASPART 100 UNIT/ML ~~LOC~~ SOLN
0.0000 [IU] | SUBCUTANEOUS | Status: DC
  Administered 2019-09-30 – 2019-10-01 (×2): 1 [IU] via SUBCUTANEOUS
  Administered 2019-10-01: 2 [IU] via SUBCUTANEOUS
  Administered 2019-10-01: 3 [IU] via SUBCUTANEOUS
  Administered 2019-10-02: 1 [IU] via SUBCUTANEOUS
  Administered 2019-10-02: 2 [IU] via SUBCUTANEOUS
  Administered 2019-10-03: 1 [IU] via SUBCUTANEOUS
  Administered 2019-10-03: 2 [IU] via SUBCUTANEOUS

## 2019-09-30 NOTE — H&P (Signed)
History and Physical    Peter James. TDS:[id number] DOB: Jan 24, 1926 DOA: 09/29/2019  PCP: [name], MD  Patient coming from: [facility]  I have personally briefly reviewed patient's old medical records in [place]  Chief Complaint: Left first toe infection  HPI: Peter Mclester. is a 86 y.o. male with medical history significant for type 2 diabetes, CKD stage III, nonobstructive CAD, PAD, hypertension, hyperlipidemia, and remote history of bladder cancer who presents to the ED for evaluation of infection of his left first toe.  Patient states he initially had wounds to both of his heels several months ago which has since healed.  He then developed a wound at his left first toe which has been progressively worsening over the last 3 weeks.  He has had increased pain and foul-smelling drainage at the area.  He was seen by his podiatrist at the [facility] in [place] for nail care and on their evaluation he was found to have osteomyelitis of left great toe.  He was subsequently sent to [facility] ED for further evaluation and management.  Patient also reports exertional dyspnea which has been ongoing for some time.  He has been trying increasing condition with the stationary bike.  He denies any cough, subjective fevers, chills, diaphoresis, chest pain, palpitations, nausea, vomiting, abdominal pain, dysuria, or diarrhea.  He reports some constipation with last bowel movement 2 days ago.  He reports chronic neuropathy in his feet.  He is very functional at baseline and ambulates on his own power.  ED Course:  Initial vitals showed BP 146/72, pulse 87, RR 18, temp 97.7 Fahrenheit, SPO2 100% on room air.  Labs notable for WBC 7.8, hemoglobin 12.6, platelets 323,000, sodium 135, potassium 4.5, bicarb 24, BUN 24, creatinine 1.57, EGFR 44 (last known creatinine on file 1.33 and EGFR >60 06/03/2012), serum glucose 200, LFTs within normal limits.  Blood cultures are ordered and  pending.  SARS-CoV-2 PCR is ordered and pending.  Left foot x-ray shows osteomyelitis of the distal first phalanx.  Patient was ordered to receive IV vancomycin and Zosyn and the hospitalist service was consulted to admit for further evaluation and management.  Review of Systems: All systems reviewed and are negative except as documented in history of present illness above.   Past Medical History:  Diagnosis Date  . Acute lower GI bleeding    Recurrent: 2011, 2012  . Bladder cancer ([place])    Transitional cell, 2001  . CAD (coronary atherosclerotic disease)    Nonobstructive, 05/2012  . Exertional dyspnea    EF 26-20%, grade 1 diastolic dysfunction, 05/5595  . HLD (hyperlipidemia)   . HTN (hypertension)   . Neuropathy   . Other insomnia   . PAD (peripheral artery disease) ([place])    ABIs, 2009: 0.89 right; 0.99 left  . Type 2 diabetes mellitus ([place])   . Valvular heart disease   . Vertigo     Past Surgical History:  Procedure Laterality Date  . CATARACT EXTRACTION     bi lateral  . COLONOSCOPY    . HEMORRHOID SURGERY    . INGUINAL HERNIA REPAIR      Social History:  reports that he quit smoking about 47 years ago. He has never used smokeless tobacco. He reports that he does not drink alcohol and does not use drugs.  No Known Allergies  Family History  Problem Relation Age of Onset  . Heart attack Mother   . Heart attack Father   .  Heart attack Sister   . Heart attack Sister   . Heart attack Sister   . Heart attack Sister      Prior to Admission medications   Medication Sig Start Date End Date Taking? Authorizing Provider  allopurinol (ZYLOPRIM) 300 MG tablet Take 300 mg by mouth as needed.    [provider]  amLODipine (NORVASC) 10 MG tablet Take 1 tablet (10 mg total) by mouth daily. 06/03/12   [name], [name], PA-C  amLODipine-benazepril (LOTREL) 10-20 MG capsule Take by mouth.    [provider]  ammonium lactate (LAC-HYDRIN) 12 % cream Apply  topically as needed for dry skin. 11/14/17   Gardiner Barefoot, DPM  aspirin 81 MG tablet Take 81 mg by mouth daily.    [provider]  atorvastatin (LIPITOR) 10 MG tablet Take 1 tablet (10 mg total) by mouth every evening. 10/17/12   [name], [name], PA-C  clonazePAM (KLONOPIN) 2 MG tablet Take 2 mg by mouth at bedtime as needed for anxiety.    [provider]  lisinopril (PRINIVIL,ZESTRIL) 20 MG tablet Take 20 mg by mouth daily.    [provider]  losartan (COZAAR) 100 MG tablet Take 100 mg by mouth daily.    [provider]  meclizine (ANTIVERT) 25 MG tablet Take 25 mg by mouth as needed.    [provider]  metFORMIN (GLUCOPHAGE) 500 MG tablet Take by mouth 2 (two) times daily with a meal.    [provider]  nortriptyline (PAMELOR) 10 MG capsule Take 10 mg by mouth at bedtime.    [provider]  tamsulosin (FLOMAX) 0.4 MG CAPS capsule Take 0.4 mg by mouth.    [provider]    Physical Exam: Vitals:   09/29/19 1939 09/29/19 2310 09/29/19 2324 09/29/19 2345  BP: (!) 139/102 (!) 131/98 (!) 157/86   Pulse: 80 84 85   Resp: 18 16 18    Temp:  98.7 F (37.1 C) 97.7 F (36.5 C) (!) 97.4 F (36.3 C)  TempSrc:  Oral Oral Oral  SpO2: 98% 100% 97%   Weight: 98.4 kg     Height: 6' 1"  (1.854 m)      Constitutional: Resting supine in bed, NAD, calm, comfortable Eyes: PERRL, lids and conjunctivae normal ENMT: Mucous membranes are moist. Posterior pharynx clear of any exudate or lesions.Normal dentition.  Neck: normal, supple, no masses. Respiratory: clear to auscultation bilaterally, no wheezing, no crackles. Normal respiratory effort. No accessory muscle use.  Cardiovascular: Regular rate and rhythm, no murmurs / rubs / gallops. No extremity edema. 2+ pedal pulses. Abdomen: no tenderness, no masses palpated. No hepatosplenomegaly. Bowel sounds positive.  Musculoskeletal: no clubbing / cyanosis. No joint deformity upper  and lower extremities except for wound at left first toe as pictured below. Good ROM, no contractures. Normal muscle tone.  Skin: Wound of distal half of left first toe with some malodorous drainage without surrounding erythema Neurologic: CN 2-12 grossly intact. Sensation intact, Strength 5/5 in all 4.  Psychiatric: Normal judgment and insight. Alert and oriented x 3. Normal mood.         Labs on Admission: I have personally reviewed following labs and imaging studies  CBC: Recent Labs  Lab 09/29/19 1448  WBC 7.8  NEUTROABS 5.1  HGB 12.6*  HCT 39.1  MCV 90.9  PLT 462   Basic Metabolic Panel: Recent Labs  Lab 09/29/19 1448  NA 135  K 4.5  CL 99  CO2 24  GLUCOSE 200*  BUN 24*  CREATININE 1.57*  CALCIUM 9.3   GFR: Estimated Creatinine Clearance: 37.1 mL/min (A) (by C-G formula based on SCr of 1.57 mg/dL (H)). Liver Function Tests: Recent Labs  Lab 09/29/19 1448  AST 21  ALT 20  ALKPHOS 79  BILITOT 0.3  PROT 7.7  ALBUMIN 3.5   No results for input(s): LIPASE, AMYLASE in the last 168 hours. No results for input(s): AMMONIA in the last 168 hours. Coagulation Profile: No results for input(s): INR, PROTIME in the last 168 hours. Cardiac Enzymes: No results for input(s): CKTOTAL, CKMB, CKMBINDEX, TROPONINI in the last 168 hours. BNP (last 3 results) No results for input(s): PROBNP in the last 8760 hours. HbA1C: No results for input(s): HGBA1C in the last 72 hours. CBG: No results for input(s): GLUCAP in the last 168 hours. Lipid Profile: No results for input(s): CHOL, HDL, LDLCALC, TRIG, CHOLHDL, LDLDIRECT in the last 72 hours. Thyroid Function Tests: No results for input(s): TSH, T4TOTAL, FREET4, T3FREE, THYROIDAB in the last 72 hours. Anemia Panel: No results for input(s): VITAMINB12, FOLATE, FERRITIN, TIBC, IRON, RETICCTPCT in the last 72 hours. Urine analysis: No results found for: COLORURINE, APPEARANCEUR, LABSPEC, [place], GLUCOSEU, HGBUR,  BILIRUBINUR, KETONESUR, PROTEINUR, UROBILINOGEN, NITRITE, LEUKOCYTESUR  Radiological Exams on Admission: DG Foot Complete Left  Result Date: 09/29/2019 CLINICAL DATA:  Left foot pain.  Infection.  Rule out osteomyelitis. EXAM: LEFT FOOT - COMPLETE 3+ VIEW COMPARISON:  None. FINDINGS: Soft tissue swelling of the great toe. Bony destruction of the tuft of the distal first phalanx compatible osteomyelitis. No foreign body or gas in the soft tissues No other areas of infection. No acute fracture. Hammertoe deformities of the second through fifth digits. IMPRESSION: Osteomyelitis distal first phalanx. Electronically Signed   By: Franchot Gallo M.D.   On: 09/29/2019 15:35    EKG: Not performed.  Assessment/Plan Principal Problem:   Osteomyelitis of great toe of left foot ([name]) Active Problems:   PAD (peripheral artery disease) ([name])   Hypertension associated with diabetes ([place])   Exertional dyspnea   CAD (coronary atherosclerotic disease)   Hyperlipidemia associated with type 2 diabetes mellitus ([name])   Type 2 diabetes mellitus ([place])   CKD (chronic kidney disease), stage III  Peter Carriger. is a 86 y.o. male with medical history significant for type 2 diabetes, CKD stage III, nonobstructive CAD, PAD, hypertension, hyperlipidemia, and remote history of bladder cancer who is admitted with osteomyelitis of great toe of left foot.  Osteomyelitis of distal first phalanx of left foot: Confirmed on x-ray.  Patient is hemodynamically stable without systemic symptoms therefore will cancel antibiotics at this time in anticipation of surgical intervention for intraoperative cultures. -Will need vascular or orthopedic surgery consult in a.m. -Holding antibiotics for now unless patient becomes febrile or develops systemic symptoms then would consider starting IV vancomycin and ceftriaxone -Follow blood cultures -Keep n.p.o. for now  Exertional dyspnea: Suspect related to conditioning.  He is  saturating well on room air with clear lungs on auscultation.  Will obtain CXR for further evaluation.  Type 2 diabetes: Placed on sensitive SSI q4h while NPO and check A1c.  CKD stage III: No recent baseline renal function available.  Continue to monitor.  CAD/PAD/Hyperlipidemia: Denies any chest pain.  Holding home aspirin tonight in case of surgical intervention.  Resume home statin pending medication reconciliation.  Hypertension: Currently stable.  Awaiting medication reconciliation.   DVT prophylaxis: SCDs Code Status: DNR, confirmed with patient Family Communication: Discussed with patient's  daughter at bedside Disposition Plan: From home and likely discharge to home pending surgical evaluation/intervention of left toe osteomyelitis Consults called: None, will need vascular or orthopedic consultation in the morning Admission status:  Status is: Inpatient  Remains inpatient appropriate because:Ongoing active pain requiring inpatient pain management and Need for surgical evaluation/management of left first toe osteomyelitis.  Dispo: The patient is from: Home              Anticipated d/c is to: Home              Anticipated d/c date is: 2 days pending surgical versus medical management of left first toe osteomyelitis.              Patient currently is not medically stable to d/c.  Zada Finders MD [facility]  If 7PM-7AM, please contact night-coverage [email or web address]  09/30/2019, 12:49 AM

## 2019-09-30 NOTE — Telephone Encounter (Signed)
Patient's daughter Colletta Maryland has questions about father's surgery tomorrow 10/01/19. Please call Colletta Maryland back at [phone].

## 2019-09-30 NOTE — Consult Note (Signed)
ORTHOPAEDIC CONSULTATION  REQUESTING PHYSICIAN: Thurnell Lose, MD  Chief Complaint: Several week history of ulceration left great toe.  HPI: Peter Garrels. is a 86 y.o. male who presents with circumferential ulceration left great toe.  Patient states that while in [place] he developed frostbite on his toes with black changes to all of his toes.   Patient states that he is borderline diabetic.  Past Medical History:  Diagnosis Date  . Acute lower GI bleeding    Recurrent: 2011, 2012  . Bladder cancer ([place])    Transitional cell, 2001  . CAD (coronary atherosclerotic disease)    Nonobstructive, 05/2012  . Exertional dyspnea    EF 53-61%, grade 1 diastolic dysfunction, 06/4313  . HLD (hyperlipidemia)   . HTN (hypertension)   . Neuropathy   . Other insomnia   . PAD (peripheral artery disease) ([place])    ABIs, 2009: 0.89 right; 0.99 left  . Type 2 diabetes mellitus ([place])   . Valvular heart disease   . Vertigo    Past Surgical History:  Procedure Laterality Date  . CATARACT EXTRACTION     bi lateral  . COLONOSCOPY    . HEMORRHOID SURGERY    . INGUINAL HERNIA REPAIR     Social History   Socioeconomic History  . Marital status: Married    Spouse name: Not on file  . Number of children: Not on file  . Years of education: Not on file  . Highest education level: Not on file  Occupational History  . Not on file  Tobacco Use  . Smoking status: Former Smoker    Quit date: 04/19/1972    Years since quitting: 47.4  . Smokeless tobacco: Never Used  Substance and Sexual Activity  . Alcohol use: Never  . Drug use: Never  . Sexual activity: Not on file  Other Topics Concern  . Not on file  Social History Narrative  . Not on file   Social Determinants of Health   Financial Resource Strain:   . Difficulty of Paying Living Expenses:   Food Insecurity:   . Worried About Charity fundraiser in the Last Year:   . Arboriculturist in the Last Year:   Transportation Needs:    . Film/video editor (Medical):   [name] Lack of Transportation (Non-Medical):   Physical Activity:   . Days of Exercise per Week:   . Minutes of Exercise per Session:   Stress:   . Feeling of Stress :   Social Connections:   . Frequency of Communication with Friends and Family:   . Frequency of Social Gatherings with Friends and Family:   . Attends Religious Services:   . Active Member of Clubs or Organizations:   . Attends Archivist Meetings:   [name] Marital Status:    Family History  Problem Relation Age of Onset  . Heart attack Mother   . Heart attack Father   . Heart attack Sister   . Heart attack Sister   . Heart attack Sister   . Heart attack Sister    - negative except otherwise stated in the family history section No Known Allergies Prior to Admission medications   Medication Sig Start Date End Date Taking? Authorizing Provider  albuterol (VENTOLIN HFA) 108 (90 Base) MCG/ACT inhaler Inhale into the lungs every 6 (six) hours as needed for wheezing or shortness of breath.   Yes [provider]  allopurinol (ZYLOPRIM) 300 MG tablet Take 300  mg by mouth daily.    Yes [provider]  Alogliptin Benzoate 12.5 MG TABS Take 6.25 mg by mouth daily.   Yes [provider]  amLODipine (NORVASC) 10 MG tablet Take 1 tablet (10 mg total) by mouth daily. 06/03/12  Yes [name], [name], PA-C  aspirin EC 81 MG tablet Take 81 mg by mouth daily. Swallow whole.   Yes [provider]  atorvastatin (LIPITOR) 10 MG tablet Take 1 tablet (10 mg total) by mouth every evening. 10/17/12  Yes [name], [name], PA-C  cholecalciferol (VITAMIN D3) 25 MCG (1000 UNIT) tablet Take 1,000 Units by mouth daily.   Yes [provider]  clotrimazole (LOTRIMIN) 1 % cream Apply 1 application topically 2 (two) times daily.   Yes [provider]  diclofenac Sodium (VOLTAREN) 1 % GEL Apply 4 g topically 4 (four) times daily.   Yes [provider]    losartan (COZAAR) 100 MG tablet Take 100 mg by mouth daily.   Yes [provider]  meloxicam (MOBIC) 7.5 MG tablet Take 7.5 mg by mouth daily as needed for pain.   Yes [provider]  nortriptyline (PAMELOR) 10 MG capsule Take 20 mg by mouth at bedtime.    Yes [provider]  polyethylene glycol (MIRALAX / GLYCOLAX) 17 g packet Take 17 g by mouth daily as needed for mild constipation.   Yes [provider]  sildenafil (VIAGRA) 100 MG tablet Take 100 mg by mouth daily as needed for erectile dysfunction.   Yes [provider]  tamsulosin (FLOMAX) 0.4 MG CAPS capsule Take 0.4 mg by mouth daily.    Yes [provider]  traMADol (ULTRAM) 50 MG tablet Take 50 mg by mouth 3 (three) times daily as needed for moderate pain.   Yes [provider]  ammonium lactate (LAC-HYDRIN) 12 % cream Apply topically as needed for dry skin. Patient not taking: Reported on 09/30/2019 11/14/17   Gardiner Barefoot, DPM   X-ray chest PA and lateral  Result Date: 09/30/2019 CLINICAL DATA:  86 year old male with left foot pain and osteomyelitis. EXAM: CHEST - 2 VIEW COMPARISON:  None FINDINGS: There is eventration of the left hemidiaphragm. There are bibasilar subsegmental densities which may represent atelectasis although infiltrate is not excluded. Clinical correlation is recommended. There is no pleural effusion or pneumothorax. There is mild cardiomegaly. Atherosclerotic calcification of the aortic arch. No acute osseous pathology. IMPRESSION: 1. Bibasilar atelectasis versus less likely infiltrate. 2. Mild cardiomegaly. 3. Elevated left hemidiaphragm. Electronically Signed   By: Anner Crete M.D.   On: 09/30/2019 01:23   DG Foot Complete Left  Result Date: 09/29/2019 CLINICAL DATA:  Left foot pain.  Infection.  Rule out osteomyelitis. EXAM: LEFT FOOT - COMPLETE 3+ VIEW COMPARISON:  None. FINDINGS: Soft tissue swelling of the great toe. Bony destruction of the  tuft of the distal first phalanx compatible osteomyelitis. No foreign body or gas in the soft tissues No other areas of infection. No acute fracture. Hammertoe deformities of the second through fifth digits. IMPRESSION: Osteomyelitis distal first phalanx. Electronically Signed   By: Franchot Gallo M.D.   On: 09/29/2019 15:35   - pertinent xrays, CT, MRI studies were reviewed and independently interpreted  Positive ROS: All other systems have been reviewed and were otherwise negative with the exception of those mentioned in the HPI and as above.  Physical Exam: General: Alert, no acute distress Psychiatric: Patient is competent for consent with normal mood and affect Lymphatic:  No axillary or cervical lymphadenopathy Cardiovascular: No pedal edema Respiratory: No cyanosis, no use of accessory musculature GI: No organomegaly, abdomen is soft and non-tender    Images:  @ENCIMAGES @  Labs:  Lab Results  Component Value Date   HGBA1C 6.9 (H) 09/30/2019    Lab Results  Component Value Date   ALBUMIN 3.5 09/29/2019    Neurologic: Patient does not have protective sensation bilateral lower extremities.   MUSCULOSKELETAL:   Skin: Examination patient has circumferential ulceration to the left great toe.  Patient has swelling in the left lower extremity with pitting edema.  A dorsalis pedis pulses not palpable secondary to the swelling.  Patient's hemoglobin A1c is 6.9.  Radiographs are reviewed which shows chronic destructive changes of the tuft of the left great toe consistent with chronic osteomyelitis.  Assessment: Assessment: History of frostbite with diabetes and chronic osteomyelitis of the left great toe.  Plan: Plan: We will plan for amputation of the left great toe at the MTP joint.  Risk and benefits were discussed including risk of the wound not healing need for additional surgery.  Patient states he understands wished to proceed.  Ankle-brachial indices are  pending.  Thank you for the consult and the opportunity to see Peter James, [place] [phone] 7:42 AM

## 2019-09-30 NOTE — Telephone Encounter (Signed)
Please advise, thank you.

## 2019-09-30 NOTE — Progress Notes (Signed)
PROGRESS NOTE                                                                                                                                                                                                             Patient Demographics:    Peter James, is a 86 y.o. male, DOB - [birth year], QIO:[id number]  Admit date - 09/29/2019   Admitting Physician Lenore Cordia, MD  Outpatient Primary MD for the patient is [name], MD  LOS - 0  Chief Complaint  Patient presents with  . Wound Infection       Brief Narrative  - Peter James. is a 86 y.o. male with medical history significant for type 2 diabetes, CKD stage III, nonobstructive CAD, PAD, hypertension, hyperlipidemia, and remote history of bladder cancer who presents to the ED for evaluation of infection of his left first toe, no reported exertional dyspnea ongoing for a few weeks.  In the hospital he was diagnosed with left toe infection and admitted to the hospital.   Subjective:    [name] today has, No headache, No chest pain, No abdominal pain - No Nausea, No new weakness tingling or numbness, No Cough - SOB.     Assessment  & Plan :     1.  Left big toe cellulitis, ulceration with osteomyelitis.  Since he has surrounding cellulitis we will start him on empiric antibiotics vancomycin and cefepime, will also check ABI to make sure blood circulation is not compromised and that extremity.  Continue aspirin and statin for now for underlying PAD history, Dr. Sharol Given has been consulted who is contemplating possible amputation of the left big toe.  Will continue to monitor closely.  2.  Exertional shortness of breath ongoing for several weeks.  No chest pain, will check baseline EKG, BNP, and echocardiogram to evaluate EF and wall motion.  Chest x-ray is nonacute with mild cardiomegaly.  3.  History of CAD/PAD.  Continue aspirin and statin for secondary prevention, will add low-dose beta-blocker, get  baseline EKG and echocardiogram.  Also ABIs for his feet.  4.  HTN.  Currently placed on Norvasc and Coreg.  Monitor.  5.  BPH.  On Flomax.  6.  Dyslipidemia.  On home dose statin.   7.  History of gout.  Continue home dose allopurinol.  8. History of DM type II.  Sliding scale, stable A1c.  Lab Results  Component Value Date   HGBA1C 6.9 (H) 09/30/2019   CBG (  last 3)  Recent Labs    09/30/19 0233 09/30/19 0405 09/30/19 0739  GLUCAP 95 98 101*     Condition -   Guarded  Family Communication  : wife [name] [phone] message left over the phone on 09/30/2019 at 9:09 AM  Code Status :  Full  Consults  :  Ortho  Procedures  :    ABI  PUD Prophylaxis : None  Disposition Plan  :    Status is: Inpatient  Remains inpatient appropriate because:IV treatments appropriate due to intensity of illness or inability to take PO   Dispo: The patient is from: Home              Anticipated d/c is to: Home              Anticipated d/c date is: 3 days              Patient currently is not medically stable to d/c.  DVT Prophylaxis  :   Heparin   Lab Results  Component Value Date   PLT 356 09/30/2019    Diet :  Diet Order            Diet Heart Room service appropriate? Yes; Fluid consistency: Thin  Diet effective now                  Inpatient Medications Scheduled Meds: . amLODipine  10 mg Oral Daily  . aspirin EC  81 mg Oral Daily  . atorvastatin  10 mg Oral QPM  . heparin injection (subcutaneous)  5,000 Units Subcutaneous Q8H  . insulin aspart  0-9 Units Subcutaneous Q4H  . tamsulosin  0.4 mg Oral Daily   Continuous Infusions: PRN Meds:.acetaminophen **OR** acetaminophen, albuterol, hydrALAZINE, HYDROcodone-acetaminophen, ondansetron **OR** ondansetron (ZOFRAN) IV, senna-docusate  Antibiotics  :   Anti-infectives (From admission, onward)   Start     Dose/Rate Route Frequency Ordered Stop   09/30/19 0015  vancomycin (VANCOREADY) IVPB 2000 mg/400 mL   Status:  Discontinued        2,000 mg 200 mL/hr over 120 Minutes Intravenous  Once 09/30/19 0007 09/30/19 0059   09/30/19 0000  piperacillin-tazobactam (ZOSYN) IVPB 3.375 g  Status:  Discontinued        3.375 g 100 mL/hr over 30 Minutes Intravenous  Once 09/29/19 2358 09/30/19 0059          Objective:   Vitals:   09/29/19 2310 09/29/19 2324 09/29/19 2345 09/30/19 0343  BP: (!) 131/98 (!) 157/86  (!) 150/83  Pulse: 84 85  81  Resp: 16 18  19   Temp: 98.7 F (37.1 C) 97.7 F (36.5 C) (!) 97.4 F (36.3 C) (!) 97.4 F (36.3 C)  TempSrc: Oral Oral Oral Oral  SpO2: 100% 97%  100%  Weight:      Height:        SpO2: 100 %  Wt Readings from Last 3 Encounters:  09/29/19 98.4 kg  06/20/12 96.5 kg  06/06/12 98 kg    No intake or output data in the 24 hours ending 09/30/19 0906   Physical Exam  Awake Alert, No new F.N deficits, Normal affect [place].AT,PERRAL Supple Neck,No JVD, No cervical lymphadenopathy appriciated.  Symmetrical Chest wall movement, Good air movement bilaterally, CTAB RRR,No Gallops,Rubs or new Murmurs, No Parasternal Heave +ve B.Sounds, Abd Soft, No tenderness, No organomegaly appriciated, No rebound - guarding or rigidity. No Cyanosis, Clubbing or edema, right big toe has a small dry ulcer, left big toe has  an ulcer with surrounding cellulitis and edema of the left foot      Data Review:    Recent Labs  Lab 09/29/19 1448 09/30/19 0303  WBC 7.8 5.7  HGB 12.6* 13.4  HCT 39.1 41.1  PLT 323 356  MCV 90.9 90.9  MCH 29.3 29.6  MCHC 32.2 32.6  RDW 13.8 13.5  LYMPHSABS 1.9  --   MONOABS 0.6  --   EOSABS 0.1  --   BASOSABS 0.0  --     Recent Labs  Lab 09/29/19 1448 09/30/19 0303  NA 135 136  K 4.5 5.1  CL 99 100  CO2 24 25  GLUCOSE 200* 95  BUN 24* 23  CREATININE 1.57* 1.33*  CALCIUM 9.3 9.7  AST 21  --   ALT 20  --   ALKPHOS 79  --   BILITOT 0.3  --   ALBUMIN 3.5  --   HGBA1C  --  6.9*    Recent Labs  Lab 09/30/19 0223    SARSCOV2NAA NEGATIVE    ------------------------------------------------------------------------------------------------------------------ No results for input(s): CHOL, HDL, LDLCALC, TRIG, CHOLHDL, LDLDIRECT in the last 72 hours.  Lab Results  Component Value Date   HGBA1C 6.9 (H) 09/30/2019   ------------------------------------------------------------------------------------------------------------------ No results for input(s): TSH, T4TOTAL, T3FREE, THYROIDAB in the last 72 hours.  Invalid input(s): FREET3 ------------------------------------------------------------------------------------------------------------------ No results for input(s): VITAMINB12, FOLATE, FERRITIN, TIBC, IRON, RETICCTPCT in the last 72 hours.  Coagulation profile No results for input(s): INR, PROTIME in the last 168 hours.  No results for input(s): DDIMER in the last 72 hours.  Cardiac Enzymes No results for input(s): CKMB, TROPONINI, MYOGLOBIN in the last 168 hours.  Invalid input(s): CK ------------------------------------------------------------------------------------------------------------------ No results found for: BNP  Micro Results Recent Results (from the past 240 hour(s))  SARS Coronavirus 2 by RT PCR (hospital order, performed in [facility] hospital lab) Nasopharyngeal Nasopharyngeal Swab     Status: None   Collection Time: 09/30/19  2:23 AM   Specimen: Nasopharyngeal Swab  Result Value Ref Range Status   SARS Coronavirus 2 NEGATIVE NEGATIVE Final    Comment: (NOTE) SARS-CoV-2 target nucleic acids are NOT DETECTED.  The SARS-CoV-2 RNA is generally detectable in upper and lower respiratory specimens during the acute phase of infection. The lowest concentration of SARS-CoV-2 viral copies this assay can detect is 250 copies / mL. A negative result does not preclude SARS-CoV-2 infection and should not be used as the sole basis for treatment or other patient management decisions.  A  negative result may occur with improper specimen collection / handling, submission of specimen other than nasopharyngeal swab, presence of viral mutation(s) within the areas targeted by this assay, and inadequate number of viral copies (<250 copies / mL). A negative result must be combined with clinical observations, patient history, and epidemiological information.  Fact Sheet for Patients:   StrictlyIdeas.no  Fact Sheet for Healthcare Providers: BankingDealers.co.za  This test is not yet approved or  cleared by the [place] FDA and has been authorized for detection and/or diagnosis of SARS-CoV-2 by FDA under an Emergency Use Authorization (EUA).  This EUA will remain in effect (meaning this test can be used) for the duration of the COVID-19 declaration under Section 564(b)(1) of the Act, 21 U.S.C. section 360bbb-3(b)(1), unless the authorization is terminated or revoked sooner.  Performed at [facility], [place] [address] [place], [place] [address]     Radiology Reports X-ray chest PA and lateral  Result Date: 09/30/2019 CLINICAL DATA:  86 year old male with left foot pain and osteomyelitis. EXAM: CHEST - 2 VIEW COMPARISON:  None FINDINGS: There is eventration of the left hemidiaphragm. There are bibasilar subsegmental densities which may represent atelectasis although infiltrate is not excluded. Clinical correlation is recommended. There is no pleural effusion or pneumothorax. There is mild cardiomegaly. Atherosclerotic calcification of the aortic arch. No acute osseous pathology. IMPRESSION: 1. Bibasilar atelectasis versus less likely infiltrate. 2. Mild cardiomegaly. 3. Elevated left hemidiaphragm. Electronically Signed   By: Anner Crete M.D.   On: 09/30/2019 01:23   DG Foot Complete Left  Result Date: 09/29/2019 CLINICAL DATA:  Left foot pain.  Infection.  Rule out osteomyelitis. EXAM: LEFT FOOT - COMPLETE 3+ VIEW  COMPARISON:  None. FINDINGS: Soft tissue swelling of the great toe. Bony destruction of the tuft of the distal first phalanx compatible osteomyelitis. No foreign body or gas in the soft tissues No other areas of infection. No acute fracture. Hammertoe deformities of the second through fifth digits. IMPRESSION: Osteomyelitis distal first phalanx. Electronically Signed   By: Franchot Gallo M.D.   On: 09/29/2019 15:35    Time Spent in minutes  30   Lala Lund M.D on 09/30/2019 at 9:06 AM  To page go to [email or web address] - password [facility]

## 2019-09-30 NOTE — H&P (View-Only) (Signed)
ORTHOPAEDIC CONSULTATION  REQUESTING PHYSICIAN: Thurnell Lose, MD  Chief Complaint: Several week history of ulceration left great toe.  HPI: Peter James. is a 86 y.o. male who presents with circumferential ulceration left great toe.  Patient states that while in [place] he developed frostbite on his toes with black changes to all of his toes.   Patient states that he is borderline diabetic.  Past Medical History:  Diagnosis Date  . Acute lower GI bleeding    Recurrent: 2011, 2012  . Bladder cancer ([place])    Transitional cell, 2001  . CAD (coronary atherosclerotic disease)    Nonobstructive, 05/2012  . Exertional dyspnea    EF 73-22%, grade 1 diastolic dysfunction, 0/2542  . HLD (hyperlipidemia)   . HTN (hypertension)   . Neuropathy   . Other insomnia   . PAD (peripheral artery disease) ([place])    ABIs, 2009: 0.89 right; 0.99 left  . Type 2 diabetes mellitus ([place])   . Valvular heart disease   . Vertigo    Past Surgical History:  Procedure Laterality Date  . CATARACT EXTRACTION     bi lateral  . COLONOSCOPY    . HEMORRHOID SURGERY    . INGUINAL HERNIA REPAIR     Social History   Socioeconomic History  . Marital status: Married    Spouse name: Not on file  . Number of children: Not on file  . Years of education: Not on file  . Highest education level: Not on file  Occupational History  . Not on file  Tobacco Use  . Smoking status: Former Smoker    Quit date: 04/19/1972    Years since quitting: 47.4  . Smokeless tobacco: Never Used  Substance and Sexual Activity  . Alcohol use: Never  . Drug use: Never  . Sexual activity: Not on file  Other Topics Concern  . Not on file  Social History Narrative  . Not on file   Social Determinants of Health   Financial Resource Strain:   . Difficulty of Paying Living Expenses:   Food Insecurity:   . Worried About Charity fundraiser in the Last Year:   . Arboriculturist in the Last Year:   Transportation Needs:    . Film/video editor (Medical):   [name] Lack of Transportation (Non-Medical):   Physical Activity:   . Days of Exercise per Week:   . Minutes of Exercise per Session:   Stress:   . Feeling of Stress :   Social Connections:   . Frequency of Communication with Friends and Family:   . Frequency of Social Gatherings with Friends and Family:   . Attends Religious Services:   . Active Member of Clubs or Organizations:   . Attends Archivist Meetings:   [name] Marital Status:    Family History  Problem Relation Age of Onset  . Heart attack Mother   . Heart attack Father   . Heart attack Sister   . Heart attack Sister   . Heart attack Sister   . Heart attack Sister    - negative except otherwise stated in the family history section No Known Allergies Prior to Admission medications   Medication Sig Start Date End Date Taking? Authorizing Provider  albuterol (VENTOLIN HFA) 108 (90 Base) MCG/ACT inhaler Inhale into the lungs every 6 (six) hours as needed for wheezing or shortness of breath.   Yes [provider]  allopurinol (ZYLOPRIM) 300 MG tablet Take 300  mg by mouth daily.    Yes [provider]  Alogliptin Benzoate 12.5 MG TABS Take 6.25 mg by mouth daily.   Yes [provider]  amLODipine (NORVASC) 10 MG tablet Take 1 tablet (10 mg total) by mouth daily. 06/03/12  Yes [name], [name], PA-C  aspirin EC 81 MG tablet Take 81 mg by mouth daily. Swallow whole.   Yes [provider]  atorvastatin (LIPITOR) 10 MG tablet Take 1 tablet (10 mg total) by mouth every evening. 10/17/12  Yes [name], [name], PA-C  cholecalciferol (VITAMIN D3) 25 MCG (1000 UNIT) tablet Take 1,000 Units by mouth daily.   Yes [provider]  clotrimazole (LOTRIMIN) 1 % cream Apply 1 application topically 2 (two) times daily.   Yes [provider]  diclofenac Sodium (VOLTAREN) 1 % GEL Apply 4 g topically 4 (four) times daily.   Yes [provider]    losartan (COZAAR) 100 MG tablet Take 100 mg by mouth daily.   Yes [provider]  meloxicam (MOBIC) 7.5 MG tablet Take 7.5 mg by mouth daily as needed for pain.   Yes [provider]  nortriptyline (PAMELOR) 10 MG capsule Take 20 mg by mouth at bedtime.    Yes [provider]  polyethylene glycol (MIRALAX / GLYCOLAX) 17 g packet Take 17 g by mouth daily as needed for mild constipation.   Yes [provider]  sildenafil (VIAGRA) 100 MG tablet Take 100 mg by mouth daily as needed for erectile dysfunction.   Yes [provider]  tamsulosin (FLOMAX) 0.4 MG CAPS capsule Take 0.4 mg by mouth daily.    Yes [provider]  traMADol (ULTRAM) 50 MG tablet Take 50 mg by mouth 3 (three) times daily as needed for moderate pain.   Yes [provider]  ammonium lactate (LAC-HYDRIN) 12 % cream Apply topically as needed for dry skin. Patient not taking: Reported on 09/30/2019 11/14/17   Gardiner Barefoot, DPM   X-ray chest PA and lateral  Result Date: 09/30/2019 CLINICAL DATA:  86 year old male with left foot pain and osteomyelitis. EXAM: CHEST - 2 VIEW COMPARISON:  None FINDINGS: There is eventration of the left hemidiaphragm. There are bibasilar subsegmental densities which may represent atelectasis although infiltrate is not excluded. Clinical correlation is recommended. There is no pleural effusion or pneumothorax. There is mild cardiomegaly. Atherosclerotic calcification of the aortic arch. No acute osseous pathology. IMPRESSION: 1. Bibasilar atelectasis versus less likely infiltrate. 2. Mild cardiomegaly. 3. Elevated left hemidiaphragm. Electronically Signed   By: Anner Crete M.D.   On: 09/30/2019 01:23   DG Foot Complete Left  Result Date: 09/29/2019 CLINICAL DATA:  Left foot pain.  Infection.  Rule out osteomyelitis. EXAM: LEFT FOOT - COMPLETE 3+ VIEW COMPARISON:  None. FINDINGS: Soft tissue swelling of the great toe. Bony destruction of the  tuft of the distal first phalanx compatible osteomyelitis. No foreign body or gas in the soft tissues No other areas of infection. No acute fracture. Hammertoe deformities of the second through fifth digits. IMPRESSION: Osteomyelitis distal first phalanx. Electronically Signed   By: Franchot Gallo M.D.   On: 09/29/2019 15:35   - pertinent xrays, CT, MRI studies were reviewed and independently interpreted  Positive ROS: All other systems have been reviewed and were otherwise negative with the exception of those mentioned in the HPI and as above.  Physical Exam: General: Alert, no acute distress Psychiatric: Patient is competent for consent with normal mood and affect Lymphatic:  No axillary or cervical lymphadenopathy Cardiovascular: No pedal edema Respiratory: No cyanosis, no use of accessory musculature GI: No organomegaly, abdomen is soft and non-tender    Images:  @ENCIMAGES @  Labs:  Lab Results  Component Value Date   HGBA1C 6.9 (H) 09/30/2019    Lab Results  Component Value Date   ALBUMIN 3.5 09/29/2019    Neurologic: Patient does not have protective sensation bilateral lower extremities.   MUSCULOSKELETAL:   Skin: Examination patient has circumferential ulceration to the left great toe.  Patient has swelling in the left lower extremity with pitting edema.  A dorsalis pedis pulses not palpable secondary to the swelling.  Patient's hemoglobin A1c is 6.9.  Radiographs are reviewed which shows chronic destructive changes of the tuft of the left great toe consistent with chronic osteomyelitis.  Assessment: Assessment: History of frostbite with diabetes and chronic osteomyelitis of the left great toe.  Plan: Plan: We will plan for amputation of the left great toe at the MTP joint.  Risk and benefits were discussed including risk of the wound not healing need for additional surgery.  Patient states he understands wished to proceed.  Ankle-brachial indices are  pending.  Thank you for the consult and the opportunity to see Peter James, [place] [phone] 7:42 AM

## 2019-09-30 NOTE — Progress Notes (Signed)
   09/30/19 1600  PT Visit Information  Last PT Received On 09/30/19  Reason Eval/Treat Not Completed Other (comment);Patient at procedure or test/unavailable   Pt was unable to be seen as he is gone to oncology appt per daughter.  Mee Hives, PT MS Acute Rehab Dept. Number: [place] [facility] [place]

## 2019-09-30 NOTE — ED Notes (Signed)
Patient transported to Ultrasound 

## 2019-09-30 NOTE — Telephone Encounter (Signed)
I called patient's daughter and reviewed proposed surgery and risks and benefits

## 2019-09-30 NOTE — Progress Notes (Signed)
  Echocardiogram 2D Echocardiogram has been performed.  Michiel Cowboy 09/30/2019, 3:11 PM

## 2019-09-30 NOTE — Discharge Planning (Signed)
Pt goes to [facility] in [place] MD: Ellouise Newer SW: Horald Chestnut  [phone] (pager); [phone] (desk)

## 2019-09-30 NOTE — Progress Notes (Signed)
ABI study completed.  ° °See Cv Proc for preliminary results.  ° °Shane Badeaux ° °

## 2019-09-30 NOTE — Progress Notes (Signed)
Pharmacy Antibiotic Note  Peter James. is a 86 y.o. male admitted on 09/29/2019 with ulceration of L-great toe, concern for osteomyelitis.  Pharmacy has been consulted for vancomycin and cefepime dosing.  Plan: Vancomycin 2g IV x 1, then 750mg  Iv every 12 hours (target vancomycin trough 15-20) Cefepime 2g IV every 12 hours F/u Ortho plan for amputation and LOT post-op, BCx results Vancomycin trough if needed  Height: 6\' 1"  (185.4 cm) Weight: 98.4 kg (217 lb) IBW/kg (Calculated) : 79.9  Temp (24hrs), Avg:97.8 F (36.6 C), Min:97.4 F (36.3 C), Max:98.7 F (37.1 C)  Recent Labs  Lab 09/29/19 1448 09/30/19 0303  WBC 7.8 5.7  CREATININE 1.57* 1.33*    Estimated Creatinine Clearance: 43.8 mL/min (A) (by C-G formula based on SCr of 1.33 mg/dL (H)).    No Known Allergies  Bertis Ruddy, PharmD Clinical Pharmacist ED Pharmacist Phone # [phone] 09/30/2019 9:21 AM

## 2019-09-30 NOTE — ED Notes (Signed)
Off floor for xray

## 2019-09-30 NOTE — ED Notes (Signed)
Lunch Tray Ordered @ 1040. 

## 2019-09-30 NOTE — Progress Notes (Signed)
Received Pt on a stretcher, assisted to bed, vital signs taken and recorded, marked DP pulse with the help of a doppler, cleansed wound with NS and applied dry dressing. Pt alert and oriented x4, daughter at bedside updated, Pt not in distress, endorsed accordingly to night shift RN.

## 2019-10-01 ENCOUNTER — Inpatient Hospital Stay (HOSPITAL_COMMUNITY): Payer: No Typology Code available for payment source | Admitting: Certified Registered Nurse Anesthetist

## 2019-10-01 ENCOUNTER — Encounter (HOSPITAL_COMMUNITY)
Admission: EM | Disposition: A | Discharge status: Skilled Nursing Facility | Payer: Self-pay | Source: Home / Self Care | Attending: Internal Medicine

## 2019-10-01 ENCOUNTER — Encounter (HOSPITAL_COMMUNITY): Payer: Self-pay | Admitting: Internal Medicine

## 2019-10-01 DIAGNOSIS — I10 Essential (primary) hypertension: Secondary | ICD-10-CM

## 2019-10-01 HISTORY — PX: AMPUTATION: SHX166

## 2019-10-01 LAB — BRAIN NATRIURETIC PEPTIDE: B Natriuretic Peptide: 67.4 pg/mL (ref 0.0–100.0)

## 2019-10-01 LAB — CBC WITH DIFFERENTIAL/PLATELET
Abs Immature Granulocytes: 0.03 10*3/uL (ref 0.00–0.07)
Basophils Absolute: 0 10*3/uL (ref 0.0–0.1)
Basophils Relative: 1 %
Eosinophils Absolute: 0.2 10*3/uL (ref 0.0–0.5)
Eosinophils Relative: 3 %
HCT: 37.9 % — ABNORMAL LOW (ref 39.0–52.0)
Hemoglobin: 12.5 g/dL — ABNORMAL LOW (ref 13.0–17.0)
Immature Granulocytes: 1 %
Lymphocytes Relative: 21 %
Lymphs Abs: 1.2 10*3/uL (ref 0.7–4.0)
MCH: 29.6 pg (ref 26.0–34.0)
MCHC: 33 g/dL (ref 30.0–36.0)
MCV: 89.8 fL (ref 80.0–100.0)
Monocytes Absolute: 0.5 10*3/uL (ref 0.1–1.0)
Monocytes Relative: 9 %
Neutro Abs: 3.9 10*3/uL (ref 1.7–7.7)
Neutrophils Relative %: 65 %
Platelets: 325 10*3/uL (ref 150–400)
RBC: 4.22 MIL/uL (ref 4.22–5.81)
RDW: 13.4 % (ref 11.5–15.5)
WBC: 6 10*3/uL (ref 4.0–10.5)
nRBC: 0 % (ref 0.0–0.2)

## 2019-10-01 LAB — COMPREHENSIVE METABOLIC PANEL
ALT: 16 U/L (ref 0–44)
AST: 17 U/L (ref 15–41)
Albumin: 3.1 g/dL — ABNORMAL LOW (ref 3.5–5.0)
Alkaline Phosphatase: 68 U/L (ref 38–126)
Anion gap: 9 (ref 5–15)
BUN: 20 mg/dL (ref 8–23)
CO2: 23 mmol/L (ref 22–32)
Calcium: 9 mg/dL (ref 8.9–10.3)
Chloride: 101 mmol/L (ref 98–111)
Creatinine, Ser: 1.34 mg/dL — ABNORMAL HIGH (ref 0.61–1.24)
GFR calc Af Amer: 53 mL/min — ABNORMAL LOW (ref >60)
GFR calc non Af Amer: 46 mL/min — ABNORMAL LOW (ref >60)
Glucose, Bld: 168 mg/dL — ABNORMAL HIGH (ref 70–99)
Potassium: 4.4 mmol/L (ref 3.5–5.1)
Sodium: 133 mmol/L — ABNORMAL LOW (ref 135–145)
Total Bilirubin: 0.6 mg/dL (ref 0.3–1.2)
Total Protein: 7.1 g/dL (ref 6.5–8.1)

## 2019-10-01 LAB — GLUCOSE, CAPILLARY
Glucose-Capillary: 106 mg/dL — ABNORMAL HIGH (ref 70–99)
Glucose-Capillary: 115 mg/dL — ABNORMAL HIGH (ref 70–99)
Glucose-Capillary: 128 mg/dL — ABNORMAL HIGH (ref 70–99)
Glucose-Capillary: 149 mg/dL — ABNORMAL HIGH (ref 70–99)
Glucose-Capillary: 151 mg/dL — ABNORMAL HIGH (ref 70–99)
Glucose-Capillary: 202 mg/dL — ABNORMAL HIGH (ref 70–99)
Glucose-Capillary: 97 mg/dL (ref 70–99)

## 2019-10-01 LAB — MAGNESIUM: Magnesium: 2 mg/dL (ref 1.7–2.4)

## 2019-10-01 LAB — C-REACTIVE PROTEIN: CRP: 3 mg/dL — ABNORMAL HIGH (ref <1.0)

## 2019-10-01 LAB — PROCALCITONIN: Procalcitonin: <0.1 ng/mL

## 2019-10-01 SURGERY — AMPUTATION DIGIT
Anesthesia: Monitor Anesthesia Care | Site: Foot | Laterality: Left

## 2019-10-01 MED ORDER — PROPOFOL 10 MG/ML IV BOLUS
INTRAVENOUS | Status: DC | PRN
  Administered 2019-10-01: 20 mg via INTRAVENOUS

## 2019-10-01 MED ORDER — PROPOFOL 500 MG/50ML IV EMUL
INTRAVENOUS | Status: DC | PRN
  Administered 2019-10-01: 75 ug/kg/min via INTRAVENOUS

## 2019-10-01 MED ORDER — ACETAMINOPHEN 500 MG PO TABS: 1000.0000 mg | ORAL_TABLET | Freq: Once | ORAL | Status: DC | PRN

## 2019-10-01 MED ORDER — ACETAMINOPHEN 160 MG/5ML PO SOLN: 1000.0000 mg | Freq: Once | ORAL | Status: DC | PRN

## 2019-10-01 MED ORDER — SODIUM CHLORIDE 0.9 % IV SOLN: INTRAVENOUS | Status: DC

## 2019-10-01 MED ORDER — ACETAMINOPHEN 10 MG/ML IV SOLN: 1000.0000 mg | Freq: Once | INTRAVENOUS | Status: DC | PRN

## 2019-10-01 MED ORDER — DEXAMETHASONE SODIUM PHOSPHATE 10 MG/ML IJ SOLN
INTRAMUSCULAR | Status: DC | PRN
  Administered 2019-10-01: 5 mg via INTRAVENOUS

## 2019-10-01 MED ORDER — FENTANYL CITRATE (PF) 100 MCG/2ML IJ SOLN: 25.0000 ug | INTRAMUSCULAR | Status: DC | PRN

## 2019-10-01 MED ORDER — CHLORHEXIDINE GLUCONATE 0.12 % MT SOLN: 15.0000 mL | Freq: Once | OROMUCOSAL | Status: AC

## 2019-10-01 MED ORDER — FENTANYL CITRATE (PF) 250 MCG/5ML IJ SOLN
INTRAMUSCULAR | Status: AC
  Filled 2019-10-01: qty 5

## 2019-10-01 MED ORDER — CHLORHEXIDINE GLUCONATE 0.12 % MT SOLN
OROMUCOSAL | Status: AC
  Administered 2019-10-01: 15 mL via OROMUCOSAL
  Filled 2019-10-01: qty 15

## 2019-10-01 MED ORDER — 0.9 % SODIUM CHLORIDE (POUR BTL) OPTIME
TOPICAL | Status: DC | PRN
  Administered 2019-10-01: 1000 mL

## 2019-10-01 MED ORDER — OXYCODONE HCL 5 MG/5ML PO SOLN: 5.0000 mg | Freq: Once | ORAL | Status: DC | PRN

## 2019-10-01 MED ORDER — LACTATED RINGERS IV SOLN: INTRAVENOUS | Status: DC

## 2019-10-01 MED ORDER — LIDOCAINE HCL (PF) 1 % IJ SOLN
INTRAMUSCULAR | Status: DC | PRN
  Administered 2019-10-01: 20 mL

## 2019-10-01 MED ORDER — OXYCODONE HCL 5 MG PO TABS: 5.0000 mg | ORAL_TABLET | Freq: Once | ORAL | Status: DC | PRN

## 2019-10-01 MED ORDER — ONDANSETRON HCL 4 MG/2ML IJ SOLN
INTRAMUSCULAR | Status: DC | PRN
  Administered 2019-10-01: 4 mg via INTRAVENOUS

## 2019-10-01 MED ORDER — HYDROMORPHONE HCL 1 MG/ML IJ SOLN: 0.5000 mg | INTRAMUSCULAR | Status: DC | PRN

## 2019-10-01 MED ORDER — FENTANYL CITRATE (PF) 100 MCG/2ML IJ SOLN
INTRAMUSCULAR | Status: AC
  Filled 2019-10-01: qty 2

## 2019-10-01 SURGICAL SUPPLY — 31 items
BLADE SURG 21 STRL SS (BLADE) ×3 IMPLANT
BNDG COHESIVE 4X5 TAN STRL (GAUZE/BANDAGES/DRESSINGS) ×3 IMPLANT
BNDG ESMARK 4X9 LF (GAUZE/BANDAGES/DRESSINGS) IMPLANT
BNDG GAUZE ELAST 4 BULKY (GAUZE/BANDAGES/DRESSINGS) ×3 IMPLANT
COVER SURGICAL LIGHT HANDLE (MISCELLANEOUS) ×6 IMPLANT
COVER WAND RF STERILE (DRAPES) ×3 IMPLANT
DRAPE U-SHAPE 47X51 STRL (DRAPES) ×3 IMPLANT
DRSG ADAPTIC 3X8 NADH LF (GAUZE/BANDAGES/DRESSINGS) IMPLANT
DRSG EMULSION OIL 3X3 NADH (GAUZE/BANDAGES/DRESSINGS) ×2 IMPLANT
DRSG PAD ABDOMINAL 8X10 ST (GAUZE/BANDAGES/DRESSINGS) ×3 IMPLANT
DURAPREP 26ML APPLICATOR (WOUND CARE) ×3 IMPLANT
ELECT REM PT RETURN 9FT ADLT (ELECTROSURGICAL) ×3
ELECTRODE REM PT RTRN 9FT ADLT (ELECTROSURGICAL) ×1 IMPLANT
GAUZE SPONGE 4X4 12PLY STRL (GAUZE/BANDAGES/DRESSINGS) IMPLANT
GAUZE SPONGE 4X4 16PLY XRAY LF (GAUZE/BANDAGES/DRESSINGS) ×2 IMPLANT
GLOVE BIOGEL PI IND STRL 9 (GLOVE) ×1 IMPLANT
GLOVE BIOGEL PI INDICATOR 9 (GLOVE) ×2
GLOVE SURG ORTHO 9.0 STRL STRW (GLOVE) ×3 IMPLANT
GOWN STRL REUS W/ TWL XL LVL3 (GOWN DISPOSABLE) ×2 IMPLANT
GOWN STRL REUS W/TWL XL LVL3 (GOWN DISPOSABLE) ×6
KIT BASIN OR (CUSTOM PROCEDURE TRAY) ×3 IMPLANT
KIT TURNOVER KIT B (KITS) ×3 IMPLANT
MANIFOLD NEPTUNE II (INSTRUMENTS) ×3 IMPLANT
NEEDLE 22X1 1/2 (OR ONLY) (NEEDLE) IMPLANT
NS IRRIG 1000ML POUR BTL (IV SOLUTION) ×3 IMPLANT
PACK ORTHO EXTREMITY (CUSTOM PROCEDURE TRAY) ×3 IMPLANT
PAD ABD 8X10 STRL (GAUZE/BANDAGES/DRESSINGS) ×2 IMPLANT
PAD ARMBOARD 7.5X6 YLW CONV (MISCELLANEOUS) ×6 IMPLANT
SUT ETHILON 2 0 PSLX (SUTURE) ×3 IMPLANT
SYR CONTROL 10ML LL (SYRINGE) IMPLANT
TOWEL GREEN STERILE (TOWEL DISPOSABLE) ×3 IMPLANT

## 2019-10-01 NOTE — Evaluation (Signed)
Occupational Therapy Evaluation Patient Details Name: Peter James. MRN: [id number] DOB: 1925-11-27 Today's Date: 10/01/2019    History of Present Illness Pt is a 86 yo male s/p L great toe osteomyelitis requiring L great toe amputation scheduled for 7/14 at 12p. PMHx: HTN, HLD, neuropathy, borderline DM.   Clinical Impression   Pt PTA: Pt living alone and reports independence with ADL, driving and reports daughter assists with IADL tasks. Pt currently supervisionA to modified independence for ADL at sink, bed mobility, transfers and functional mobility using RW and post-op shoe. Pt scheduled for L great toe amputation this PM and would need a follow-up visit if weight bearing status has changed. Pt would benefit from continued OT skilled services. OT following acutely.    Follow Up Recommendations  Follow surgeon's recommendation for DC plan and follow-up therapies    Equipment Recommendations  None recommended by OT    Recommendations for Other Services       Precautions / Restrictions Precautions Precautions: Fall Restrictions Weight Bearing Restrictions: No      Mobility Bed Mobility Overal bed mobility: Needs Assistance Bed Mobility: Supine to Sit;Sit to Supine     Supine to sit: Supervision Sit to supine: Supervision   General bed mobility comments: pt lifting BLEs at the same time for sit to supine  Transfers Overall transfer level: Needs assistance Equipment used: Rolling walker (2 wheeled) Transfers: Sit to/from Stand Sit to Stand: Supervision              Balance Overall balance assessment: No apparent balance deficits (not formally assessed)                                         ADL either performed or assessed with clinical judgement   ADL Overall ADL's : At baseline                                       General ADL Comments: Pt requiring no physical assist for ADL or mobility. Pt using RW for stability.  Pt transferring on/off commode; standing at sink for ADL and donning/doffing post-op shoe.       Vision Baseline Vision/History: Wears glasses Wears Glasses: Reading only Patient Visual Report: No change from baseline Vision Assessment?: No apparent visual deficits     Perception     Praxis      Pertinent Vitals/Pain Pain Assessment: No/denies pain     Hand Dominance Right   Extremity/Trunk Assessment Upper Extremity Assessment Upper Extremity Assessment: Overall WFL for tasks assessed   Lower Extremity Assessment Lower Extremity Assessment: Defer to PT evaluation;Overall [facility] for tasks assessed   Cervical / Trunk Assessment Cervical / Trunk Assessment: Normal   Communication Communication Communication: No difficulties   Cognition Arousal/Alertness: Awake/alert Behavior During Therapy: WFL for tasks assessed/performed Overall Cognitive Status: Within Functional Limits for tasks assessed                                     General Comments  VSS on RA.    Exercises     Shoulder Instructions      Home Living Family/patient expects to be discharged to:: Private residence Living Arrangements: Alone Available Help at Discharge: Family;Available PRN/intermittently Type of  Home: House Home Access: Level entry     Home Layout: One level     Bathroom Shower/Tub: Occupational psychologist: Handicapped height     Home Equipment: Cane - single point          Prior Functioning/Environment Level of Independence: Independent with assistive device(s)        Comments: Independent with ADL; driivng; IADLs provided by daughter who lives very close by;        OT Problem List: Decreased strength;Decreased activity tolerance;Impaired balance (sitting and/or standing);Decreased safety awareness;Pain;Increased edema      OT Treatment/Interventions: Self-care/ADL training;Therapeutic exercise;DME and/or AE instruction;Energy  conservation;Therapeutic activities;Patient/family education;Balance training    OT Goals(Current goals can be found in the care plan section) Acute Rehab OT Goals Patient Stated Goal: to go home OT Goal Formulation: With patient Time For Goal Achievement: 10/15/19 Potential to Achieve Goals: Good ADL Goals Pt Will Perform Lower Body Dressing: with modified independence;sitting/lateral leans;sit to/from stand Additional ADL Goal #1: Pt will increase to modified independence with ADL tasks abiding by weight bearing status.  OT Frequency: Min 2X/week   Barriers to D/C:            Co-evaluation              AM-PAC OT "6 Clicks" Daily Activity     Outcome Measure Help from another person eating meals?: None Help from another person taking care of personal grooming?: A Little Help from another person toileting, which includes using toliet, bedpan, or urinal?: A Little Help from another person bathing (including washing, rinsing, drying)?: A Little Help from another person to put on and taking off regular upper body clothing?: None Help from another person to put on and taking off regular lower body clothing?: A Little 6 Click Score: 20   End of Session Equipment Utilized During Treatment: Gait belt;Rolling walker Nurse Communication: Mobility status  Activity Tolerance: Patient tolerated treatment well Patient left: in bed;with call bell/phone within reach;with bed alarm set  OT Visit Diagnosis: Unsteadiness on feet (R26.81)                Time: 2924-4628 OT Time Calculation (min): 24 min Charges:  OT General Charges $OT Visit: 1 Visit OT Evaluation $OT Eval Moderate Complexity: 1 Mod OT Treatments $Self Care/Home Management : 8-22 mins  Jefferey Pica, OTR/L Acute Rehabilitation Services Pager: [phone] Office: [phone]   Duha Abair C 10/01/2019, 10:15 AM

## 2019-10-01 NOTE — Anesthesia Procedure Notes (Signed)
Procedure Name: MAC Date/Time: 10/01/2019 1:15 PM Performed by: Darletta Moll, CRNA Pre-anesthesia Checklist: Patient identified, Emergency Drugs available, Suction available and Patient being monitored Patient Re-evaluated:Patient Re-evaluated prior to induction Oxygen Delivery Method: Simple face mask

## 2019-10-01 NOTE — Progress Notes (Addendum)
PROGRESS NOTE                                                                                                                                                                                                             Patient Demographics:    Peter James, is a 86 y.o. male, DOB - 08-Feb-1926, TOI:[id number]  Admit date - 09/29/2019   Admitting Physician Lenore Cordia, MD  Outpatient Primary MD for the patient is [name], MD  LOS - 1  Chief Complaint  Patient presents with  . Wound Infection       Brief Narrative  - Peter Konicek. is a 86 y.o. male with medical history significant for type 2 diabetes, CKD stage III, nonobstructive CAD, PAD, hypertension, hyperlipidemia, and remote history of bladder cancer who presents to the ED for evaluation of infection of his left first toe, no reported exertional dyspnea ongoing for a few weeks.  In the hospital he was diagnosed with left toe infection and admitted to the hospital.   Subjective:   Patient states that he is feeling well. Denies any chest pain or shortness of breath.   Assessment  & Plan :     Left big toe cellulitis, ulceration with osteomyelitis Patient was started on empiric antibiotics including cefepime and vancomycin.  Orthopedics was consulted.  Plan is for amputation of the left big toe.  Ultrasound showed moderate arterial disease.  Exertional dyspnea This has been ongoing for a few weeks.  Etiology is unclear.  BNP 57.3.  Echocardiogram does show diminished ejection fraction but patient without any signs of CHF.  Chest x-ray showed mild cardiomegaly without any other acute process.  Will need to evaluate if he has any history of tobacco use in the past.  History of coronary artery disease and peripheral artery disease Continue aspirin and statin.  Low-dose beta-blocker was added yesterday.  EKG does show LBBB with the patient denies any chest pain.  Pursue medical management.  Chronic systolic  CHF Ejection fraction noted to be 40-45%.  Seems to be euvolemic currently.  Continue beta-blocker.  He is noted to have elevated creatinine so we will hold off on initiating ACE inhibitor or ARB.  Due to advanced age likely not a candidate for invasive cardiac testing.  May benefit from outpatient cardiology evaluation.  Essential hypertension Currently on carvedilol and amlodipine.  History of BPH On Flomax  Dyslipidemia Continue statin  History of gout Continue allopurinol  Diabetes mellitus type 2  HbA1c 6.9.  Continue SSI.  Home medication list show that he is on alogliptin which can be continued at discharge.  DVT prophylaxis: Subcutaneous heparin CODE STATUS: DNR Family communication: No family at bedside.  Discussed with patient Disposition: We will need PT and OT evaluation.  Hopefully be able to return home when improved.  Status is: Inpatient  Remains inpatient appropriate because:IV treatments appropriate due to intensity of illness or inability to take PO   Dispo: The patient is from: Home              Anticipated d/c is to: Home              Anticipated d/c date is: 2 days              Patient currently is not medically stable to d/c.    Consults: Dr. Sharol Given with orthopedics  Procedures: Amputation of the left great toe at the metatarsophalangeal joint   Diet :  Diet Order            Diet Heart Room service appropriate? Yes; Fluid consistency: Thin  Diet effective now                  Inpatient Medications Scheduled Meds: . [MAR Hold] allopurinol  300 mg Oral Daily  . [MAR Hold] amLODipine  10 mg Oral Daily  . [MAR Hold] aspirin EC  81 mg Oral Daily  . [MAR Hold] atorvastatin  10 mg Oral QPM  . [MAR Hold] carvedilol  3.125 mg Oral BID WC  . [MAR Hold] heparin injection (subcutaneous)  5,000 Units Subcutaneous Q8H  . [MAR Hold] insulin aspart  0-9 Units Subcutaneous Q4H  . [MAR Hold] tamsulosin  0.4 mg Oral Daily   Continuous Infusions: . [MAR  Hold] ceFEPime (MAXIPIME) IV 2 g (10/01/19 1137)  . lactated ringers 10 mL/hr at 10/01/19 1302  . [MAR Hold] vancomycin 750 mg (10/01/19 1142)   PRN Meds:.[MAR Hold] acetaminophen **OR** [DISCONTINUED] acetaminophen, [MAR Hold] albuterol, [MAR Hold] hydrALAZINE, [MAR Hold] HYDROcodone-acetaminophen, [DISCONTINUED] ondansetron **OR** [MAR Hold] ondansetron (ZOFRAN) IV, [MAR Hold] senna-docusate  Antibiotics  :   Anti-infectives (From admission, onward)   Start     Dose/Rate Route Frequency Ordered Stop   10/01/19 0600  ceFAZolin (ANCEF) IVPB 2g/100 mL premix        2 g 200 mL/hr over 30 Minutes Intravenous On call to O.R. 09/30/19 1955 10/01/19 1308   09/30/19 2200  [MAR Hold]  vancomycin (VANCOREADY) IVPB 750 mg/150 mL     Discontinue     (MAR Hold since Wed 10/01/2019 at 1206.Hold Reason: Transfer to a Procedural area.)   750 mg 150 mL/hr over 60 Minutes Intravenous Every 12 hours 09/30/19 0922     09/30/19 1000  [MAR Hold]  ceFEPIme (MAXIPIME) 2 g in sodium chloride 0.9 % 100 mL IVPB     Discontinue     (MAR Hold since Wed 10/01/2019 at 1206.Hold Reason: Transfer to a Procedural area.)   2 g 200 mL/hr over 30 Minutes Intravenous Every 12 hours 09/30/19 0922     09/30/19 0930  vancomycin (VANCOREADY) IVPB 2000 mg/400 mL        2,000 mg 200 mL/hr over 120 Minutes Intravenous  Once 09/30/19 0922 09/30/19 1644   09/30/19 0015  vancomycin (VANCOREADY) IVPB 2000 mg/400 mL  Status:  Discontinued        2,000 mg 200 mL/hr over 120 Minutes Intravenous  Once 09/30/19 0007 09/30/19 0059   09/30/19  0000  piperacillin-tazobactam (ZOSYN) IVPB 3.375 g  Status:  Discontinued        3.375 g 100 mL/hr over 30 Minutes Intravenous  Once 09/29/19 2358 09/30/19 0059          Objective:   Vitals:   10/01/19 0422 10/01/19 0741 10/01/19 1216 10/01/19 1335  BP: (!) 142/67 126/68    Pulse: 85 84    Resp: 18 17    Temp: 98.4 F (36.9 C) 98.4 F (36.9 C)  (P) 97.9 F (36.6 C)  TempSrc: Oral Oral      SpO2: 100% 96%    Weight:   98.4 kg   Height:   6' 0.99" (1.854 m)     SpO2: 96 %  Wt Readings from Last 3 Encounters:  10/01/19 98.4 kg  06/20/12 96.5 kg  06/06/12 98 kg     Intake/Output Summary (Last 24 hours) at 10/01/2019 1338 Last data filed at 10/01/2019 0400 Gross per 24 hour  Intake 251.79 ml  Output --  Net 251.79 ml     Physical Exam  General appearance: Awake alert.  In no distress Resp: Clear to auscultation bilaterally.  Normal effort Cardio: S1-S2 is normal regular.  No S3-S4.  No rubs murmurs or bruit GI: Abdomen is soft.  Nontender nondistended.  Bowel sounds are present normal.  No masses organomegaly Extremities: Left foot covered in dressing. Neurologic: Alert and oriented x3.  No focal neurological deficits.       Data Review:    Recent Labs  Lab 09/29/19 1448 09/30/19 0303 10/01/19 0151  WBC 7.8 5.7 6.0  HGB 12.6* 13.4 12.5*  HCT 39.1 41.1 37.9*  PLT 323 356 325  MCV 90.9 90.9 89.8  MCH 29.3 29.6 29.6  MCHC 32.2 32.6 33.0  RDW 13.8 13.5 13.4  LYMPHSABS 1.9  --  1.2  MONOABS 0.6  --  0.5  EOSABS 0.1  --  0.2  BASOSABS 0.0  --  0.0    Recent Labs  Lab 09/29/19 1448 09/30/19 0303 09/30/19 1412 10/01/19 0151  NA 135 136  --  133*  K 4.5 5.1  --  4.4  CL 99 100  --  101  CO2 24 25  --  23  GLUCOSE 200* 95  --  168*  BUN 24* 23  --  20  CREATININE 1.57* 1.33*  --  1.34*  CALCIUM 9.3 9.7  --  9.0  AST 21  --   --  17  ALT 20  --   --  16  ALKPHOS 79  --   --  68  BILITOT 0.3  --   --  0.6  ALBUMIN 3.5  --   --  3.1*  MG  --   --   --  2.0  CRP  --   --   --  3.0*  PROCALCITON  --   --  <0.10 <0.10  INR  --   --  1.0  --   HGBA1C  --  6.9*  --   --   BNP  --   --  57.3 67.4    Recent Labs  Lab 09/30/19 0223 09/30/19 1412 10/01/19 0151  CRP  --   --  3.0*  BNP  --  57.3 67.4  PROCALCITON  --  <0.10 <0.10  SARSCOV2NAA NEGATIVE  --   --      Coagulation profile Recent Labs  Lab 09/30/19 1412  INR 1.0     ------------------------------------------------------------------------------------------------------------------  Component Value Date/Time   BNP 67.4 10/01/2019 0151    Micro Results Recent Results (from the past 240 hour(s))  Blood culture (routine x 2)     Status: None (Preliminary result)   Collection Time: 09/30/19  1:21 AM   Specimen: BLOOD RIGHT HAND  Result Value Ref Range Status   Specimen Description BLOOD RIGHT HAND  Final   Special Requests   Final    BOTTLES DRAWN AEROBIC AND ANAEROBIC Blood Culture adequate volume   Culture   Final    NO GROWTH 1 DAY Performed at [facility], [place] [address], [place], [place] [address]    Report Status PENDING  Incomplete  Blood culture (routine x 2)     Status: None (Preliminary result)   Collection Time: 09/30/19  1:24 AM   Specimen: BLOOD  Result Value Ref Range Status   Specimen Description BLOOD LEFT ANTECUBITAL  Final   Special Requests   Final    BOTTLES DRAWN AEROBIC AND ANAEROBIC Blood Culture adequate volume   Culture   Final    NO GROWTH 1 DAY Performed at [facility], [place] [address], [place], [place] [address]    Report Status PENDING  Incomplete  SARS Coronavirus 2 by RT PCR (hospital order, performed in [facility] hospital lab) Nasopharyngeal Nasopharyngeal Swab     Status: None   Collection Time: 09/30/19  2:23 AM   Specimen: Nasopharyngeal Swab  Result Value Ref Range Status   SARS Coronavirus 2 NEGATIVE NEGATIVE Final    Comment: (NOTE) SARS-CoV-2 target nucleic acids are NOT DETECTED.  The SARS-CoV-2 RNA is generally detectable in upper and lower respiratory specimens during the acute phase of infection. The lowest concentration of SARS-CoV-2 viral copies this assay can detect is 250 copies / mL. A negative result does not preclude SARS-CoV-2 infection and should not be used as the sole basis for treatment or other patient management decisions.  A negative result may occur  with improper specimen collection / handling, submission of specimen other than nasopharyngeal swab, presence of viral mutation(s) within the areas targeted by this assay, and inadequate number of viral copies (<250 copies / mL). A negative result must be combined with clinical observations, patient history, and epidemiological information.  Fact Sheet for Patients:   StrictlyIdeas.no  Fact Sheet for Healthcare Providers: BankingDealers.co.za  This test is not yet approved or  cleared by the [place] FDA and has been authorized for detection and/or diagnosis of SARS-CoV-2 by FDA under an Emergency Use Authorization (EUA).  This EUA will remain in effect (meaning this test can be used) for the duration of the COVID-19 declaration under Section 564(b)(1) of the Act, 21 U.S.C. section 360bbb-3(b)(1), unless the authorization is terminated or revoked sooner.  Performed at [facility], [place] [address], [place], [place] [address]   Surgical pcr screen     Status: None   Collection Time: 09/30/19  7:11 PM   Specimen: Nasal Mucosa; Nasal Swab  Result Value Ref Range Status   MRSA, PCR NEGATIVE NEGATIVE Final   Staphylococcus aureus NEGATIVE NEGATIVE Final    Comment: (NOTE) The Xpert SA Assay (FDA approved for NASAL specimens in patients 58 years of age and older), is one component of a comprehensive surveillance program. It is not intended to diagnose infection nor to guide or monitor treatment. Performed at [facility], [place] [employer] [address], [place], [place] [address]     Radiology Reports X-ray chest PA and  lateral  Result Date: 09/30/2019 CLINICAL DATA:  86 year old male with left foot pain and osteomyelitis. EXAM: CHEST - 2 VIEW COMPARISON:  None FINDINGS: There is eventration of the left hemidiaphragm. There are bibasilar subsegmental densities which may represent atelectasis although infiltrate is not excluded.  Clinical correlation is recommended. There is no pleural effusion or pneumothorax. There is mild cardiomegaly. Atherosclerotic calcification of the aortic arch. No acute osseous pathology. IMPRESSION: 1. Bibasilar atelectasis versus less likely infiltrate. 2. Mild cardiomegaly. 3. Elevated left hemidiaphragm. Electronically Signed   By: Anner Crete M.D.   On: 09/30/2019 01:23   DG Foot Complete Left  Result Date: 09/29/2019 CLINICAL DATA:  Left foot pain.  Infection.  Rule out osteomyelitis. EXAM: LEFT FOOT - COMPLETE 3+ VIEW COMPARISON:  None. FINDINGS: Soft tissue swelling of the great toe. Bony destruction of the tuft of the distal first phalanx compatible osteomyelitis. No foreign body or gas in the soft tissues No other areas of infection. No acute fracture. Hammertoe deformities of the second through fifth digits. IMPRESSION: Osteomyelitis distal first phalanx. Electronically Signed   By: Franchot Gallo M.D.   On: 09/29/2019 15:35   VAS [place] ABI WITH/WO TBI  Result Date: 09/30/2019 LOWER EXTREMITY DOPPLER STUDY Indications: Ulceration LT great toe. High Risk Factors: Hypertension, hyperlipidemia.  Comparison Study: ABI 2009 from outside facility. Performing Technologist: Darlin Coco  Examination Guidelines: A complete evaluation includes at minimum, Doppler waveform signals and systolic blood pressure reading at the level of bilateral brachial, anterior tibial, and posterior tibial arteries, when vessel segments are accessible. Bilateral testing is considered an integral part of a complete examination. Photoelectric Plethysmograph (PPG) waveforms and toe systolic pressure readings are included as required and additional duplex testing as needed. Limited examinations for reoccurring indications may be performed as noted.  ABI Findings: +---------+------------------+-----+---------+--------+ Right    Rt Pressure (mmHg)IndexWaveform Comment   +---------+------------------+-----+---------+--------+ Brachial 125                    triphasic         +---------+------------------+-----+---------+--------+ PTA      89                0.68 triphasic         +---------+------------------+-----+---------+--------+ DP       91                0.70 biphasic          +---------+------------------+-----+---------+--------+ Great Toe75                0.58 Abnormal          +---------+------------------+-----+---------+--------+ +---------+------------------+-----+---------+------------------------------+ Left     Lt Pressure (mmHg)IndexWaveform Comment                        +---------+------------------+-----+---------+------------------------------+ Brachial 130                    triphasic                               +---------+------------------+-----+---------+------------------------------+ PTA      100               0.77 triphasic                               +---------+------------------+-----+---------+------------------------------+ DP       98  0.75 triphasic                               +---------+------------------+-----+---------+------------------------------+ Great Toe                                Unable to obtain due to wound. +---------+------------------+-----+---------+------------------------------+ +-------+-----------+-----------+------------+------------+ ABI/TBIToday's ABIToday's TBIPrevious ABIPrevious TBI +-------+-----------+-----------+------------+------------+ Right  0.70       0.58                                +-------+-----------+-----------+------------+------------+ Left   0.77                                           +-------+-----------+-----------+------------+------------+  Summary: Right: Resting right ankle-brachial index indicates moderate right lower extremity arterial disease. Left: Resting left ankle-brachial index indicates  moderate left lower extremity arterial disease.  *See table(s) above for measurements and observations.  Electronically signed by Harold Barban MD on 09/30/2019 at 4:21:28 PM.   Final    ECHOCARDIOGRAM COMPLETE  Result Date: 09/30/2019    ECHOCARDIOGRAM REPORT   Patient Name:   Peter Crotteau. Date of Exam: 09/30/2019 Medical Rec #:  [id number]           Height:       73.0 in Accession #:    [id number]          Weight:       217.0 lb Date of Birth:  02-19-1926            BSA:          2.228 m Patient Age:    14 years            BP:           157/79 mmHg Patient Gender: M                   HR:           89 bpm. Exam Location:  Inpatient Procedure: 2D Echo, Cardiac Doppler and Color Doppler Indications:    CHF-Acute Diastolic 732.20 / U54.27  History:        Patient has prior history of Echocardiogram examinations, most                 recent 05/29/2012. CAD; Risk Factors:Hypertension, Dyslipidemia,                 Diabetes and Former Smoker. PAD.  Sonographer:    Vickie Epley RDCS Referring Phys: Concord Kinta  1. Left ventricular ejection fraction, by estimation, is 40 to 45%. The left ventricle has mildly decreased function. The left ventricle demonstrates global hypokinesis. Left ventricular diastolic function could not be evaluated.  2. Right ventricular systolic function is normal. The right ventricular size is normal.  3. The mitral valve is normal in structure. Mild mitral valve regurgitation.  4. The aortic valve is tricuspid. Aortic valve regurgitation is mild. Mild aortic valve sclerosis is present, with no evidence of aortic valve stenosis.  5. The inferior vena cava is normal in size with greater than 50% respiratory variability, suggesting right atrial pressure of 3 mmHg. Comparison(s): Prior images unable to be directly viewed, comparison  made by report only. Conclusion(s)/Recommendation(s): Unable to view prior images, but EF is mild-moderately reduced compared to prior. There is  significant dyssynchrony and global hypokinesis, no clear focal wall motion abnormalities. FINDINGS  Left Ventricle: Left ventricular ejection fraction, by estimation, is 40 to 45%. The left ventricle has mildly decreased function. The left ventricle demonstrates global hypokinesis. The left ventricular internal cavity size was normal in size. There is  no left ventricular hypertrophy. Abnormal (paradoxical) septal motion, consistent with left bundle branch block. Left ventricular diastolic function could not be evaluated. Right Ventricle: The right ventricular size is normal. No increase in right ventricular wall thickness. Right ventricular systolic function is normal. Left Atrium: Left atrial size was normal in size. Right Atrium: Right atrial size was normal in size. Pericardium: There is no evidence of pericardial effusion. Mitral Valve: The mitral valve is normal in structure. Mild mitral valve regurgitation. Tricuspid Valve: The tricuspid valve is normal in structure. Tricuspid valve regurgitation is trivial. Aortic Valve: The aortic valve is tricuspid. Aortic valve regurgitation is mild. Mild aortic valve sclerosis is present, with no evidence of aortic valve stenosis. Pulmonic Valve: The pulmonic valve was not well visualized. Pulmonic valve regurgitation is not visualized. Aorta: The aortic root, ascending aorta and aortic arch are all structurally normal, with no evidence of dilitation or obstruction. Venous: The inferior vena cava is normal in size with greater than 50% respiratory variability, suggesting right atrial pressure of 3 mmHg. IAS/Shunts: The atrial septum is grossly normal.  LEFT VENTRICLE PLAX 2D LVOT diam:     2.40 cm LV SV:         65 LV SV Index:   29 LVOT Area:     4.52 cm  LV Volumes (MOD) LV vol d, MOD A2C: 141.0 ml LV vol d, MOD A4C: 94.0 ml LV vol s, MOD A2C: 70.7 ml LV vol s, MOD A4C: 66.4 ml LV SV MOD A2C:     70.3 ml LV SV MOD A4C:     94.0 ml LV SV MOD BP:      49.6 ml RIGHT  VENTRICLE TAPSE (M-mode): 1.6 cm LEFT ATRIUM             Index       RIGHT ATRIUM           Index LA Vol (A2C):   36.2 ml 16.25 ml/m RA Area:     11.30 cm LA Vol (A4C):   33.4 ml 14.99 ml/m RA Volume:   22.40 ml  10.06 ml/m LA Biplane Vol: 36.2 ml 16.25 ml/m  AORTIC VALVE LVOT Vmax:   93.80 cm/s LVOT Vmean:  63.700 cm/s LVOT VTI:    0.143 m  AORTA Ao Root diam: 3.50 cm  SHUNTS Systemic VTI:  0.14 m Systemic Diam: 2.40 cm Buford Dresser MD Electronically signed by Buford Dresser MD Signature Date/Time: 09/30/2019/6:50:16 PM    Final     Bonnielee Haff M.D on 10/01/2019 at 1:38 PM  To page go to [email or web address] - password [facility]

## 2019-10-01 NOTE — Progress Notes (Signed)
PT Cancellation Note  Patient Details Name: Peter James. MRN: [id number] DOB: [birth year]   Cancelled Treatment:    Reason Eval/Treat Not Completed: Patient at procedure or test/unavailable (planned left 1st toe amputation).    Wyona Almas, PT, DPT Acute Rehabilitation Services Pager [phone] Office [phone]    Deno Etienne 10/01/2019, 12:08 PM

## 2019-10-01 NOTE — Plan of Care (Signed)

## 2019-10-01 NOTE — Op Note (Signed)
10/01/2019  1:25 PM  PATIENT:  Peter James.    PRE-OPERATIVE DIAGNOSIS:  Osteomyelitis Left Great Toe  POST-OPERATIVE DIAGNOSIS:  Same  PROCEDURE:  LEFT GREAT TOE AMPUTATION AT METATARSOPHALANGEAL JOINT  SURGEON:  [name], MD  PHYSICIAN ASSISTANT:None ANESTHESIA:   General  PREOPERATIVE INDICATIONS:  Peter James. is a  86 y.o. male with a diagnosis of Osteomyelitis Left Great Toe who failed conservative measures and elected for surgical management.    The risks benefits and alternatives were discussed with the patient preoperatively including but not limited to the risks of infection, bleeding, nerve injury, cardiopulmonary complications, the need for revision surgery, among others, and the patient was willing to proceed.  OPERATIVE IMPLANTS: None  _0 @  OPERATIVE FINDINGS: Minimal petechial bleeding at the amputation site  OPERATIVE PROCEDURE: Patient was brought the operating room underwent a MAC anesthetic.  The left lower extremity was then prepped using DuraPrep draped into a sterile field a timeout was called.  Patient received local infiltration of 20 cc of 1% lidocaine plain.  A fishmouth incision was made just distal to the MTP joint.  The toe was amputated through the MTP joint there was minimal petechial bleeding the wound was irrigated with normal saline electrocautery was used hemostasis the incision was closed using 2-0 nylon.  Sterile dressing was applied patient was taken the PACU in stable condition.   DISCHARGE PLANNING:  Antibiotic duration: 24-hour antibiotics postoperatively  Weightbearing: Nonweightbearing on the left  Pain medication: Opioid pathway  Dressing care/ Wound VAC: Change dressing in 1 week  Ambulatory devices: Walker  Discharge to: Discharge planning based on recommendations of physical therapy  Follow-up: In the office 1 week post operative.

## 2019-10-01 NOTE — Transfer of Care (Signed)
Immediate Anesthesia Transfer of Care Note  Patient: Peter James.  Procedure(s) Performed: LEFT GREAT TOE AMPUTATION AT METATARSOPHALANGEAL JOINT (Left Foot)  Patient Location: PACU  Anesthesia Type:MAC  Level of Consciousness: drowsy and patient cooperative  Airway & Oxygen Therapy: Patient Spontanous Breathing and Patient connected to face mask oxygen  Post-op Assessment: Report given to RN, Post -op Vital signs reviewed and stable and Patient moving all extremities X 4  Post vital signs: Reviewed and stable  Last Vitals:  Vitals Value Taken Time  BP 120/74 10/01/19 1335  Temp    Pulse 83 10/01/19 1337  Resp 20 10/01/19 1337  SpO2 97 % 10/01/19 1337  Vitals shown include unvalidated device data.  Last Pain:  Vitals:   10/01/19 0741  TempSrc: Oral  PainSc:          Complications: No complications documented.

## 2019-10-01 NOTE — Progress Notes (Signed)
Orthopedic Tech Progress Note Patient Details:  Peter James. 11-08-1925 [id number] PACU RN called for a POST OP SHOE Ortho Devices Type of Ortho Device: Postop shoe/boot Ortho Device/Splint Location: LLE Ortho Device/Splint Interventions: Ordered, Application   Post Interventions Patient Tolerated: Well Instructions Provided: Care of device   Peter James 10/01/2019, 2:35 PM

## 2019-10-01 NOTE — Anesthesia Preprocedure Evaluation (Addendum)
Anesthesia Evaluation  Patient identified by MRN, date of birth, ID band Patient awake    Reviewed: Allergy & Precautions, NPO status , Patient's Chart, lab work & pertinent test results  History of Anesthesia Complications Negative for: history of anesthetic complications  Airway Mallampati: II  TM Distance: >3 FB Neck ROM: Full    Dental  (+) Dental Advisory Given, Upper Dentures, Partial Lower   Pulmonary neg shortness of breath, neg recent URI, former smoker,    breath sounds clear to auscultation       Cardiovascular hypertension, Pt. on medications + CAD, + Peripheral Vascular Disease and +CHF   Rhythm:Regular   '21 TTE - EF 40 to 45%. Global hypokinesis. Mild MR and AI. Mild aortic valve sclerosis    Neuro/Psych  Vertigo   Neuromuscular disease (neuropathy) negative psych ROS   GI/Hepatic negative GI ROS, Neg liver ROS,   Endo/Other  diabetes, Type 2  Renal/GU CRFRenal disease    Bladder cancer     Musculoskeletal negative musculoskeletal ROS (+)   Abdominal   Peds  Hematology  (+) anemia ,   Anesthesia Other Findings Covid test negative   Reproductive/Obstetrics                           Anesthesia Physical Anesthesia Plan  ASA: III  Anesthesia Plan: MAC   Post-op Pain Management:    Induction: Intravenous  PONV Risk Score and Plan: 1 and Propofol infusion and Treatment may vary due to age or medical condition  Airway Management Planned: Natural Airway and Simple Face Mask  Additional Equipment: None  Intra-op Plan:   Post-operative Plan:   Informed Consent: I have reviewed the patients History and Physical, chart, labs and discussed the procedure including the risks, benefits and alternatives for the proposed anesthesia with the patient or authorized representative who has indicated his/her understanding and acceptance.   Patient has DNR.  Discussed DNR with  patient and Suspend DNR.     Plan Discussed with: CRNA and Anesthesiologist  Anesthesia Plan Comments:        Anesthesia Quick Evaluation

## 2019-10-01 NOTE — Interval H&P Note (Signed)
History and Physical Interval Note:  10/01/2019 6:40 AM  Peter James.  has presented today for surgery, with the diagnosis of Osteomyelitis Left Great Toe.  The various methods of treatment have been discussed with the patient and family. After consideration of risks, benefits and other options for treatment, the patient has consented to  Procedure(s): LEFT GREAT TOE AMPUTATION AT METATARSOPHALANGEAL JOINT (Left) as a surgical intervention.  The patient's history has been reviewed, patient examined, no change in status, stable for surgery.  I have reviewed the patient's chart and labs.  Questions were answered to the patient's satisfaction.     [name]

## 2019-10-02 ENCOUNTER — Encounter (HOSPITAL_COMMUNITY): Payer: Self-pay | Admitting: Orthopedic Surgery

## 2019-10-02 DIAGNOSIS — N179 Acute kidney failure, unspecified: Secondary | ICD-10-CM

## 2019-10-02 LAB — CBC WITH DIFFERENTIAL/PLATELET
Abs Immature Granulocytes: 0.03 10*3/uL (ref 0.00–0.07)
Basophils Absolute: 0 10*3/uL (ref 0.0–0.1)
Basophils Relative: 0 %
Eosinophils Absolute: 0 10*3/uL (ref 0.0–0.5)
Eosinophils Relative: 0 %
HCT: 36.2 % — ABNORMAL LOW (ref 39.0–52.0)
Hemoglobin: 11.7 g/dL — ABNORMAL LOW (ref 13.0–17.0)
Immature Granulocytes: 1 %
Lymphocytes Relative: 17 %
Lymphs Abs: 1.1 10*3/uL (ref 0.7–4.0)
MCH: 29 pg (ref 26.0–34.0)
MCHC: 32.3 g/dL (ref 30.0–36.0)
MCV: 89.6 fL (ref 80.0–100.0)
Monocytes Absolute: 0.3 10*3/uL (ref 0.1–1.0)
Monocytes Relative: 5 %
Neutro Abs: 5.1 10*3/uL (ref 1.7–7.7)
Neutrophils Relative %: 77 %
Platelets: 324 10*3/uL (ref 150–400)
RBC: 4.04 MIL/uL — ABNORMAL LOW (ref 4.22–5.81)
RDW: 13.5 % (ref 11.5–15.5)
WBC: 6.6 10*3/uL (ref 4.0–10.5)
nRBC: 0 % (ref 0.0–0.2)

## 2019-10-02 LAB — MAGNESIUM: Magnesium: 1.9 mg/dL (ref 1.7–2.4)

## 2019-10-02 LAB — COMPREHENSIVE METABOLIC PANEL
ALT: 22 U/L (ref 0–44)
AST: 23 U/L (ref 15–41)
Albumin: 2.9 g/dL — ABNORMAL LOW (ref 3.5–5.0)
Alkaline Phosphatase: 66 U/L (ref 38–126)
Anion gap: 9 (ref 5–15)
BUN: 25 mg/dL — ABNORMAL HIGH (ref 8–23)
CO2: 22 mmol/L (ref 22–32)
Calcium: 9.2 mg/dL (ref 8.9–10.3)
Chloride: 105 mmol/L (ref 98–111)
Creatinine, Ser: 1.68 mg/dL — ABNORMAL HIGH (ref 0.61–1.24)
GFR calc Af Amer: 40 mL/min — ABNORMAL LOW (ref >60)
GFR calc non Af Amer: 35 mL/min — ABNORMAL LOW (ref >60)
Glucose, Bld: 144 mg/dL — ABNORMAL HIGH (ref 70–99)
Potassium: 5.2 mmol/L — ABNORMAL HIGH (ref 3.5–5.1)
Sodium: 136 mmol/L (ref 135–145)
Total Bilirubin: 0.3 mg/dL (ref 0.3–1.2)
Total Protein: 7.1 g/dL (ref 6.5–8.1)

## 2019-10-02 LAB — BRAIN NATRIURETIC PEPTIDE: B Natriuretic Peptide: 108.5 pg/mL — ABNORMAL HIGH (ref 0.0–100.0)

## 2019-10-02 LAB — PROCALCITONIN: Procalcitonin: <0.1 ng/mL

## 2019-10-02 LAB — GLUCOSE, CAPILLARY
Glucose-Capillary: 100 mg/dL — ABNORMAL HIGH (ref 70–99)
Glucose-Capillary: 117 mg/dL — ABNORMAL HIGH (ref 70–99)
Glucose-Capillary: 130 mg/dL — ABNORMAL HIGH (ref 70–99)
Glucose-Capillary: 138 mg/dL — ABNORMAL HIGH (ref 70–99)
Glucose-Capillary: 153 mg/dL — ABNORMAL HIGH (ref 70–99)
Glucose-Capillary: 157 mg/dL — ABNORMAL HIGH (ref 70–99)
Glucose-Capillary: 98 mg/dL (ref 70–99)

## 2019-10-02 LAB — C-REACTIVE PROTEIN: CRP: 1.7 mg/dL — ABNORMAL HIGH (ref <1.0)

## 2019-10-02 MED ORDER — POLYETHYLENE GLYCOL 3350 17 G PO PACK
17.0000 g | PACK | Freq: Every day | ORAL | Status: DC
  Administered 2019-10-02 – 2019-10-08 (×7): 17 g via ORAL
  Filled 2019-10-02 (×8): qty 1

## 2019-10-02 NOTE — Progress Notes (Addendum)
PROGRESS NOTE                                                                                                                                                                                                             Patient Demographics:    Peter James, is a 86 y.o. male, DOB - 14-Feb-1926, HAL:[id number]  Admit date - 09/29/2019   Admitting Physician Lenore Cordia, MD  Outpatient Primary MD for the patient is [name], MD  LOS - 2  Chief Complaint  Patient presents with  . Wound Infection       Brief Narrative  - Peter Drummonds. is a 86 y.o. male with medical history significant for type 2 diabetes, CKD stage III, nonobstructive CAD, PAD, hypertension, hyperlipidemia, and remote history of bladder cancer who presents to the ED for evaluation of infection of his left first toe, no reported exertional dyspnea ongoing for a few weeks.  In the hospital he was diagnosed with left toe infection and admitted to the hospital.   Subjective:   Patient says that the surgery went well.  He does have some difficulty breathing for a little while after his surgery but reports feeling very well this morning.  Pain is very well controlled.  No shortness of breath or nausea vomiting this morning.   Assessment  & Plan :     Left big toe cellulitis, ulceration with osteomyelitis Patient was started on empiric antibiotics including cefepime and vancomycin.  Orthopedics was consulted.  Patient underwent amputation of the toes on 7/14.  Vascular ultrasound only showed moderate arterial disease.  Since he had surrounding cellulitis we will continue with antibiotics for now.  Change to oral tomorrow.  Acute renal failure with mild hyperkalemia/CKD 3a Creatinine has gone up slightly.  We will give him gentle IV hydration.  Recheck labs tomorrow.  He is not getting any potassium supplements.  Monitor urine output.  Exertional dyspnea This has been ongoing for a few weeks.  Etiology  is unclear.  BNP 57.3.  Echocardiogram does show diminished ejection fraction but patient without any signs of CHF.  Chest x-ray showed mild cardiomegaly without any other acute process.  Patient may benefit from being referred to cardiology in the outpatient setting.  This can be done by his PCP in [place], Dr. Woody Seller.  History of coronary artery disease and peripheral artery disease Continue aspirin and statin.  Low-dose beta-blocker was added.  EKG does show LBBB with the patient denies any  chest pain.  Pursue medical management.  Outpatient referral to cardiology.  Chronic systolic CHF Ejection fraction noted to be 40-45%.  Seems to be euvolemic.  Continue carvedilol.  He is noted to have elevated creatinine so we will hold off on initiating ACE inhibitor or ARB.  Due to advanced age likely not a candidate for invasive cardiac testing.    Essential hypertension Currently on carvedilol and amlodipine.  Monitor blood pressures closely.  History of BPH On Flomax  Dyslipidemia Continue statin  History of gout Continue allopurinol  Diabetes mellitus type 2 HbA1c 6.9.  Continue SSI.  Home medication list show that he is on alogliptin which can be continued at discharge.  Normocytic anemia Monitor hemoglobin.  No evidence of overt bleeding.  DVT prophylaxis: Subcutaneous heparin CODE STATUS: DNR Family communication: No family at bedside.  Discussed with patient Disposition: PT and OT evaluation.  Hopefully home in 24 to 48 hours.   Status is: Inpatient  Remains inpatient appropriate because:Ongoing diagnostic testing needed not appropriate for outpatient work up and IV treatments appropriate due to intensity of illness or inability to take PO   Dispo:  Patient From: Home  Planned Disposition: Home  Expected discharge date: 10/03/19  Medically stable for discharge: No      Consults: Dr. Sharol Given with orthopedics  Procedures: Amputation of the left great toe at the  metatarsophalangeal joint   Diet :  Diet Order            Diet Carb Modified Fluid consistency: Thin; Room service appropriate? Yes  Diet effective now                  Inpatient Medications Scheduled Meds: . allopurinol  300 mg Oral Daily  . amLODipine  10 mg Oral Daily  . aspirin EC  81 mg Oral Daily  . atorvastatin  10 mg Oral QPM  . carvedilol  3.125 mg Oral BID WC  . heparin injection (subcutaneous)  5,000 Units Subcutaneous Q8H  . insulin aspart  0-9 Units Subcutaneous Q4H  . tamsulosin  0.4 mg Oral Daily   Continuous Infusions: . sodium chloride 75 mL/hr at 10/02/19 0904  . ceFEPime (MAXIPIME) IV 2 g (10/02/19 0907)  . vancomycin 750 mg (10/02/19 0003)   PRN Meds:.acetaminophen **OR** [DISCONTINUED] acetaminophen, albuterol, hydrALAZINE, HYDROcodone-acetaminophen, HYDROmorphone (DILAUDID) injection, [DISCONTINUED] ondansetron **OR** ondansetron (ZOFRAN) IV, senna-docusate  Antibiotics  :   Anti-infectives (From admission, onward)   Start     Dose/Rate Route Frequency Ordered Stop   10/01/19 0600  ceFAZolin (ANCEF) IVPB 2g/100 mL premix        2 g 200 mL/hr over 30 Minutes Intravenous On call to O.R. 09/30/19 1955 10/01/19 1308   09/30/19 2200  vancomycin (VANCOREADY) IVPB 750 mg/150 mL     Discontinue     750 mg 150 mL/hr over 60 Minutes Intravenous Every 12 hours 09/30/19 0922     09/30/19 1000  ceFEPIme (MAXIPIME) 2 g in sodium chloride 0.9 % 100 mL IVPB     Discontinue     2 g 200 mL/hr over 30 Minutes Intravenous Every 12 hours 09/30/19 0922     09/30/19 0930  vancomycin (VANCOREADY) IVPB 2000 mg/400 mL        2,000 mg 200 mL/hr over 120 Minutes Intravenous  Once 09/30/19 0922 09/30/19 1644   09/30/19 0015  vancomycin (VANCOREADY) IVPB 2000 mg/400 mL  Status:  Discontinued        2,000 mg 200 mL/hr over 120 Minutes  Intravenous  Once 09/30/19 0007 09/30/19 0059   09/30/19 0000  piperacillin-tazobactam (ZOSYN) IVPB 3.375 g  Status:  Discontinued        3.375  g 100 mL/hr over 30 Minutes Intravenous  Once 09/29/19 2358 09/30/19 0059          Objective:   Vitals:   10/01/19 1723 10/01/19 1938 10/01/19 2339 10/02/19 0900  BP: 138/76 136/79 133/77 (!) 155/71  Pulse: 87 97 61 90  Resp: 16 17 18 16   Temp: 98.2 F (36.8 C) 98.3 F (36.8 C) 98.3 F (36.8 C) 97.8 F (36.6 C)  TempSrc: Oral Oral Oral Oral  SpO2: 98% 93% 100% 96%  Weight:      Height:        SpO2: 96 %  Wt Readings from Last 3 Encounters:  10/01/19 98.4 kg  06/20/12 96.5 kg  06/06/12 98 kg     Intake/Output Summary (Last 24 hours) at 10/02/2019 1137 Last data filed at 10/02/2019 0754 Gross per 24 hour  Intake 757.72 ml  Output 1610 ml  Net -852.28 ml     Physical Exam  General appearance: Awake alert.  In no distress Resp: Clear to auscultation bilaterally.  Normal effort Cardio: S1-S2 is normal regular.  No S3-S4.  No rubs murmurs or bruit GI: Abdomen is soft.  Nontender nondistended.  Bowel sounds are present normal.  No masses organomegaly Extremities: Left foot covered in dressing. Neurologic: Alert and oriented x3.  No focal neurological deficits.       Data Review:    Recent Labs  Lab 09/29/19 1448 09/30/19 0303 10/01/19 0151 10/02/19 0249  WBC 7.8 5.7 6.0 6.6  HGB 12.6* 13.4 12.5* 11.7*  HCT 39.1 41.1 37.9* 36.2*  PLT 323 356 325 324  MCV 90.9 90.9 89.8 89.6  MCH 29.3 29.6 29.6 29.0  MCHC 32.2 32.6 33.0 32.3  RDW 13.8 13.5 13.4 13.5  LYMPHSABS 1.9  --  1.2 1.1  MONOABS 0.6  --  0.5 0.3  EOSABS 0.1  --  0.2 0.0  BASOSABS 0.0  --  0.0 0.0    Recent Labs  Lab 09/29/19 1448 09/30/19 0303 09/30/19 1412 10/01/19 0151 10/02/19 0249  NA 135 136  --  133* 136  K 4.5 5.1  --  4.4 5.2*  CL 99 100  --  101 105  CO2 24 25  --  23 22  GLUCOSE 200* 95  --  168* 144*  BUN 24* 23  --  20 25*  CREATININE 1.57* 1.33*  --  1.34* 1.68*  CALCIUM 9.3 9.7  --  9.0 9.2  AST 21  --   --  17 23  ALT 20  --   --  16 22  ALKPHOS 79  --   --  68  66  BILITOT 0.3  --   --  0.6 0.3  ALBUMIN 3.5  --   --  3.1* 2.9*  MG  --   --   --  2.0 1.9  CRP  --   --   --  3.0* 1.7*  PROCALCITON  --   --  <0.10 <0.10 <0.10  INR  --   --  1.0  --   --   HGBA1C  --  6.9*  --   --   --   BNP  --   --  57.3 67.4 108.5*    Recent Labs  Lab 09/30/19 0223 09/30/19 1412 10/01/19 0151 10/02/19 0249  CRP  --   --  3.0* 1.7*  BNP  --  57.3 67.4 108.5*  PROCALCITON  --  <0.10 <0.10 <0.10  SARSCOV2NAA NEGATIVE  --   --   --      Coagulation profile Recent Labs  Lab 09/30/19 1412  INR 1.0    ------------------------------------------------------------------------------------------------------------------    Component Value Date/Time   BNP 108.5 (H) 10/02/2019 0249    Micro Results Recent Results (from the past 240 hour(s))  Blood culture (routine x 2)     Status: None (Preliminary result)   Collection Time: 09/30/19  1:21 AM   Specimen: BLOOD RIGHT HAND  Result Value Ref Range Status   Specimen Description BLOOD RIGHT HAND  Final   Special Requests   Final    BOTTLES DRAWN AEROBIC AND ANAEROBIC Blood Culture adequate volume   Culture   Final    NO GROWTH 1 DAY Performed at [facility], [address], [place], [place] [address]    Report Status PENDING  Incomplete  Blood culture (routine x 2)     Status: None (Preliminary result)   Collection Time: 09/30/19  1:24 AM   Specimen: BLOOD  Result Value Ref Range Status   Specimen Description BLOOD LEFT ANTECUBITAL  Final   Special Requests   Final    BOTTLES DRAWN AEROBIC AND ANAEROBIC Blood Culture adequate volume   Culture   Final    NO GROWTH 1 DAY Performed at [facility], [place] [address], [place], [place] [address]    Report Status PENDING  Incomplete  SARS Coronavirus 2 by RT PCR (hospital order, performed in [facility] hospital lab) Nasopharyngeal Nasopharyngeal Swab     Status: None   Collection Time: 09/30/19  2:23 AM   Specimen: Nasopharyngeal Swab    Result Value Ref Range Status   SARS Coronavirus 2 NEGATIVE NEGATIVE Final    Comment: (NOTE) SARS-CoV-2 target nucleic acids are NOT DETECTED.  The SARS-CoV-2 RNA is generally detectable in upper and lower respiratory specimens during the acute phase of infection. The lowest concentration of SARS-CoV-2 viral copies this assay can detect is 250 copies / mL. A negative result does not preclude SARS-CoV-2 infection and should not be used as the sole basis for treatment or other patient management decisions.  A negative result may occur with improper specimen collection / handling, submission of specimen other than nasopharyngeal swab, presence of viral mutation(s) within the areas targeted by this assay, and inadequate number of viral copies (<250 copies / mL). A negative result must be combined with clinical observations, patient history, and epidemiological information.  Fact Sheet for Patients:   StrictlyIdeas.no  Fact Sheet for Healthcare Providers: BankingDealers.co.za  This test is not yet approved or  cleared by the [place] FDA and has been authorized for detection and/or diagnosis of SARS-CoV-2 by FDA under an Emergency Use Authorization (EUA).  This EUA will remain in effect (meaning this test can be used) for the duration of the COVID-19 declaration under Section 564(b)(1) of the Act, 21 U.S.C. section 360bbb-3(b)(1), unless the authorization is terminated or revoked sooner.  Performed at [facility], [place] [address], [place], [place] [address]   Surgical pcr screen     Status: None   Collection Time: 09/30/19  7:11 PM   Specimen: Nasal Mucosa; Nasal Swab  Result Value Ref Range Status   MRSA, PCR NEGATIVE NEGATIVE Final   Staphylococcus aureus NEGATIVE NEGATIVE Final    Comment: (NOTE) The Xpert SA Assay (FDA approved for  NASAL specimens in patients 62 years of age and older), is one component of a  comprehensive surveillance program. It is not intended to diagnose infection nor to guide or monitor treatment. Performed at [facility], [place] [address] [place], [place] [address]     Radiology Reports X-ray chest PA and lateral  Result Date: 09/30/2019 CLINICAL DATA:  86 year old male with left foot pain and osteomyelitis. EXAM: CHEST - 2 VIEW COMPARISON:  None FINDINGS: There is eventration of the left hemidiaphragm. There are bibasilar subsegmental densities which may represent atelectasis although infiltrate is not excluded. Clinical correlation is recommended. There is no pleural effusion or pneumothorax. There is mild cardiomegaly. Atherosclerotic calcification of the aortic arch. No acute osseous pathology. IMPRESSION: 1. Bibasilar atelectasis versus less likely infiltrate. 2. Mild cardiomegaly. 3. Elevated left hemidiaphragm. Electronically Signed   By: Anner Crete M.D.   On: 09/30/2019 01:23   DG Foot Complete Left  Result Date: 09/29/2019 CLINICAL DATA:  Left foot pain.  Infection.  Rule out osteomyelitis. EXAM: LEFT FOOT - COMPLETE 3+ VIEW COMPARISON:  None. FINDINGS: Soft tissue swelling of the great toe. Bony destruction of the tuft of the distal first phalanx compatible osteomyelitis. No foreign body or gas in the soft tissues No other areas of infection. No acute fracture. Hammertoe deformities of the second through fifth digits. IMPRESSION: Osteomyelitis distal first phalanx. Electronically Signed   By: Franchot Gallo M.D.   On: 09/29/2019 15:35   VAS [place] ABI WITH/WO TBI  Result Date: 09/30/2019 LOWER EXTREMITY DOPPLER STUDY Indications: Ulceration LT great toe. High Risk Factors: Hypertension, hyperlipidemia.  Comparison Study: ABI 2009 from outside facility. Performing Technologist: Darlin Coco  Examination Guidelines: A complete evaluation includes at minimum, Doppler waveform signals and systolic blood pressure reading at the level of bilateral brachial, anterior  tibial, and posterior tibial arteries, when vessel segments are accessible. Bilateral testing is considered an integral part of a complete examination. Photoelectric Plethysmograph (PPG) waveforms and toe systolic pressure readings are included as required and additional duplex testing as needed. Limited examinations for reoccurring indications may be performed as noted.  ABI Findings: +---------+------------------+-----+---------+--------+ Right    Rt Pressure (mmHg)IndexWaveform Comment  +---------+------------------+-----+---------+--------+ Brachial 125                    triphasic         +---------+------------------+-----+---------+--------+ PTA      89                0.68 triphasic         +---------+------------------+-----+---------+--------+ DP       91                0.70 biphasic          +---------+------------------+-----+---------+--------+ Great Toe75                0.58 Abnormal          +---------+------------------+-----+---------+--------+ +---------+------------------+-----+---------+------------------------------+ Left     Lt Pressure (mmHg)IndexWaveform Comment                        +---------+------------------+-----+---------+------------------------------+ Brachial 130                    triphasic                               +---------+------------------+-----+---------+------------------------------+ PTA      100  0.77 triphasic                               +---------+------------------+-----+---------+------------------------------+ DP       98                0.75 triphasic                               +---------+------------------+-----+---------+------------------------------+ Great Toe                                Unable to obtain due to wound. +---------+------------------+-----+---------+------------------------------+ +-------+-----------+-----------+------------+------------+ ABI/TBIToday's  ABIToday's TBIPrevious ABIPrevious TBI +-------+-----------+-----------+------------+------------+ Right  0.70       0.58                                +-------+-----------+-----------+------------+------------+ Left   0.77                                           +-------+-----------+-----------+------------+------------+  Summary: Right: Resting right ankle-brachial index indicates moderate right lower extremity arterial disease. Left: Resting left ankle-brachial index indicates moderate left lower extremity arterial disease.  *See table(s) above for measurements and observations.  Electronically signed by Harold Barban MD on 09/30/2019 at 4:21:28 PM.   Final    ECHOCARDIOGRAM COMPLETE  Result Date: 09/30/2019    ECHOCARDIOGRAM REPORT   Patient Name:   Peter James. Date of Exam: 09/30/2019 Medical Rec #:  [id number]           Height:       73.0 in Accession #:    [id number]          Weight:       217.0 lb Date of Birth:  04/29/1925            BSA:          2.228 m Patient Age:    55 years            BP:           157/79 mmHg Patient Gender: M                   HR:           89 bpm. Exam Location:  Inpatient Procedure: 2D Echo, Cardiac Doppler and Color Doppler Indications:    CHF-Acute Diastolic 283.15 / V76.16  History:        Patient has prior history of Echocardiogram examinations, most                 recent 05/29/2012. CAD; Risk Factors:Hypertension, Dyslipidemia,                 Diabetes and Former Smoker. PAD.  Sonographer:    Vickie Epley RDCS Referring Phys: [place]  1. Left ventricular ejection fraction, by estimation, is 40 to 45%. The left ventricle has mildly decreased function. The left ventricle demonstrates global hypokinesis. Left ventricular diastolic function could not be evaluated.  2. Right ventricular systolic function is normal. The right ventricular size is normal.  3. The mitral valve is normal in structure. Mild mitral valve regurgitation.  4. The aortic valve is tricuspid. Aortic valve regurgitation is mild. Mild aortic valve sclerosis is present, with no evidence of aortic valve stenosis.  5. The inferior vena cava is normal in size with greater than 50% respiratory variability, suggesting right atrial pressure of 3 mmHg. Comparison(s): Prior images unable to be directly viewed, comparison made by report only. Conclusion(s)/Recommendation(s): Unable to view prior images, but EF is mild-moderately reduced compared to prior. There is significant dyssynchrony and global hypokinesis, no clear focal wall motion abnormalities. FINDINGS  Left Ventricle: Left ventricular ejection fraction, by estimation, is 40 to 45%. The left ventricle has mildly decreased function. The left ventricle demonstrates global hypokinesis. The left ventricular internal cavity size was normal in size. There is  no left ventricular hypertrophy. Abnormal (paradoxical) septal motion, consistent with left bundle branch block. Left ventricular diastolic function could not be evaluated. Right Ventricle: The right ventricular size is normal. No increase in right ventricular wall thickness. Right ventricular systolic function is normal. Left Atrium: Left atrial size was normal in size. Right Atrium: Right atrial size was normal in size. Pericardium: There is no evidence of pericardial effusion. Mitral Valve: The mitral valve is normal in structure. Mild mitral valve regurgitation. Tricuspid Valve: The tricuspid valve is normal in structure. Tricuspid valve regurgitation is trivial. Aortic Valve: The aortic valve is tricuspid. Aortic valve regurgitation is mild. Mild aortic valve sclerosis is present, with no evidence of aortic valve stenosis. Pulmonic Valve: The pulmonic valve was not well visualized. Pulmonic valve regurgitation is not visualized. Aorta: The aortic root, ascending aorta and aortic arch are all structurally normal, with no evidence of dilitation or obstruction. Venous:  The inferior vena cava is normal in size with greater than 50% respiratory variability, suggesting right atrial pressure of 3 mmHg. IAS/Shunts: The atrial septum is grossly normal.  LEFT VENTRICLE PLAX 2D LVOT diam:     2.40 cm LV SV:         65 LV SV Index:   29 LVOT Area:     4.52 cm  LV Volumes (MOD) LV vol d, MOD A2C: 141.0 ml LV vol d, MOD A4C: 94.0 ml LV vol s, MOD A2C: 70.7 ml LV vol s, MOD A4C: 66.4 ml LV SV MOD A2C:     70.3 ml LV SV MOD A4C:     94.0 ml LV SV MOD BP:      49.6 ml RIGHT VENTRICLE TAPSE (M-mode): 1.6 cm LEFT ATRIUM             Index       RIGHT ATRIUM           Index LA Vol (A2C):   36.2 ml 16.25 ml/m RA Area:     11.30 cm LA Vol (A4C):   33.4 ml 14.99 ml/m RA Volume:   22.40 ml  10.06 ml/m LA Biplane Vol: 36.2 ml 16.25 ml/m  AORTIC VALVE LVOT Vmax:   93.80 cm/s LVOT Vmean:  63.700 cm/s LVOT VTI:    0.143 m  AORTA Ao Root diam: 3.50 cm  SHUNTS Systemic VTI:  0.14 m Systemic Diam: 2.40 cm Buford Dresser MD Electronically signed by Buford Dresser MD Signature Date/Time: 09/30/2019/6:50:16 PM    Final     Bonnielee Haff M.D on 10/02/2019 at 11:37 AM  To page go to [email or web address] - password [facility]

## 2019-10-02 NOTE — Anesthesia Postprocedure Evaluation (Signed)
Anesthesia Post Note  Patient: Peter James.  Procedure(s) Performed: LEFT GREAT TOE AMPUTATION AT METATARSOPHALANGEAL JOINT (Left Foot)     Patient location during evaluation: PACU Anesthesia Type: MAC Level of consciousness: awake and alert Pain management: pain level controlled Vital Signs Assessment: post-procedure vital signs reviewed and stable Respiratory status: spontaneous breathing, nonlabored ventilation, respiratory function stable and patient connected to nasal cannula oxygen Cardiovascular status: stable and blood pressure returned to baseline Postop Assessment: no apparent nausea or vomiting Anesthetic complications: no   No complications documented.  Last Vitals:  Vitals:   10/02/19 0900 10/02/19 1546  BP: (!) 155/71 137/67  Pulse: 90 86  Resp: 16 18  Temp: 36.6 C 36.9 C  SpO2: 96% 100%    Last Pain:  Vitals:   10/02/19 1546  TempSrc: Oral  PainSc:                  Asaiah Scarber

## 2019-10-02 NOTE — NC FL2 (Signed)
[place] LEVEL OF CARE SCREENING TOOL     IDENTIFICATION  Patient Name: Peter James. Birthdate: 01-05-1926 Sex: male Admission Date (Current Location): 09/29/2019  [facility] and [place] Number:  [employer] and Address:  [facility] [place] [facility]. [facility], [place] [address] [place], [place] [address]      Provider Number: [id number]  Attending Physician Name and Address:  Bonnielee Haff, MD  Relative Name and Phone Number:  Colletta [place], daughter, [phone]    Current Level of Care: Hospital Recommended Level of Care: [place] Prior Approval Number:    Date Approved/Denied:   PASRR Number:   [id number]  Discharge Plan: SNF    Current Diagnoses: Patient Active Problem List   Diagnosis Date Noted  . Osteomyelitis of great toe of left foot ([place]) 09/30/2019  . CKD (chronic kidney disease), stage III 09/30/2019  . Type 2 diabetes mellitus ([place])   . CAD (coronary atherosclerotic disease)   . Hyperlipidemia associated with type 2 diabetes mellitus ([place])   . Exertional dyspnea 05/20/2012  . PAD (peripheral artery disease) ([place])   . Hypertension associated with diabetes ([place])   . Valvular heart disease     Orientation RESPIRATION BLADDER Height & Weight     Self, Time, Place, Situation  Normal Continent Weight: 217 lb (98.4 kg) Height:  6' 0.99" (185.4 cm)  BEHAVIORAL SYMPTOMS/MOOD NEUROLOGICAL BOWEL NUTRITION STATUS      Continent Diet (Please see DC Summary)  AMBULATORY STATUS COMMUNICATION OF NEEDS Skin   Limited Assist Verbally Surgical wounds (Closed incision on leg)                       Personal Care Assistance Level of Assistance  Bathing, Feeding, Dressing Bathing Assistance: Limited assistance Feeding assistance: Independent Dressing Assistance: Limited assistance     Functional Limitations Info  Sight, Hearing, Speech Sight Info: Adequate Hearing Info: Adequate Speech Info: Adequate    SPECIAL  CARE FACTORS FREQUENCY  PT (By licensed PT), OT (By licensed OT)     PT Frequency: 5x/week OT Frequency: 4x/week            Contractures Contractures Info: Not present    Additional Factors Info  Code Status, Allergies, Insulin Sliding Scale Code Status Info: DNR Allergies Info: NKA   Insulin Sliding Scale Info: See dc summary for dose       Current Medications (10/02/2019):  This is the current hospital active medication list Current Facility-Administered Medications  Medication Dose Route Frequency Provider Last Rate Last Admin  . 0.9 %  sodium chloride infusion   Intravenous Continuous James, Bevely Palmer, [place] 75 mL/hr at 10/02/19 1500 Rate Verify at 10/02/19 1500  . acetaminophen (TYLENOL) tablet 650 mg  650 mg Oral Q6H PRN James, Bevely Palmer, [place]   650 mg at 10/01/19 2103  . albuterol (PROVENTIL) (2.5 MG/3ML) 0.083% nebulizer solution 3 mL  3 mL Inhalation Q6H PRN James, Bevely Palmer, PA      . allopurinol (ZYLOPRIM) tablet 300 mg  300 mg Oral Daily James, Bevely Palmer, PA   300 mg at 10/02/19 0907  . amLODipine (NORVASC) tablet 10 mg  10 mg Oral Daily James, Bevely Palmer, [place]   10 mg at 10/02/19 0907  . aspirin EC tablet 81 mg  81 mg Oral Daily James, Bevely Palmer, [place]   81 mg at 10/02/19 0907  . atorvastatin (LIPITOR) tablet 10 mg  10 mg Oral QPM James, Bevely Palmer, [place]  10 mg at 10/02/19 1641  . carvedilol (COREG) tablet 3.125 mg  3.125 mg Oral BID WC James, Bevely Palmer, PA   3.125 mg at 10/02/19 1642  . ceFEPIme (MAXIPIME) 2 g in sodium chloride 0.9 % 100 mL IVPB  2 g Intravenous Q12H James, Bevely Palmer, PA   Stopped at 10/02/19 1055  . heparin injection 5,000 Units  5,000 Units Subcutaneous Q8H James, Bevely Palmer, PA   5,000 Units at 10/02/19 1502  . hydrALAZINE (APRESOLINE) injection 10 mg  10 mg Intravenous Q6H PRN James, Bevely Palmer, [place]      . HYDROcodone-acetaminophen (NORCO/VICODIN) 5-325 MG per tablet 1-2 tablet  1-2 tablet Oral Q4H PRN James, Bevely Palmer, [place]      .  HYDROmorphone (DILAUDID) injection 0.5 mg  0.5 mg Intravenous Q4H PRN James, Bevely Palmer, PA      . insulin aspart (novoLOG) injection 0-9 Units  0-9 Units Subcutaneous Q4H James, Bevely Palmer, [place]   1 Units at 10/02/19 0410  . ondansetron (ZOFRAN) injection 4 mg  4 mg Intravenous Q6H PRN James, Bevely Palmer, PA      . senna-docusate (Senokot-S) tablet 1 tablet  1 tablet Oral QHS PRN James, Bevely Palmer, [place]   1 tablet at 10/01/19 1604  . tamsulosin (FLOMAX) capsule 0.4 mg  0.4 mg Oral Daily James, Bevely Palmer, PA   0.4 mg at 10/02/19 0907  . vancomycin (VANCOREADY) IVPB 750 mg/150 mL  750 mg Intravenous Q12H James, Bevely Palmer, [place]   Stopped at 10/02/19 1413     Discharge Medications: Please see discharge summary for a list of discharge medications.  Relevant Imaging Results:  Relevant Lab Results:   Additional Information SSN: [place], [place]

## 2019-10-02 NOTE — Plan of Care (Signed)

## 2019-10-02 NOTE — Progress Notes (Signed)
Patient is postop day 1 status post left great toe amputation.  He is alert and awake states he has no pain   Vital signs stable he is comfortably lying in bed appropriate to questions.  Dressing is clean dry intact postop shoe is in place   Patient will work with PT today he does live alone.  From a orthopedic standpoint could discharge when disposition which is best suited to him has been determined.  He will need follow-up with [place] in 1 week

## 2019-10-02 NOTE — Progress Notes (Signed)
Occupational Therapy Treatment Patient Details Name: Peter James. MRN: [id number] DOB: 1925/10/14 Today's Date: 10/02/2019    History of present illness Pt is a 86 yo male admitted 09/29/19 with L great toe osteomyelitis requiring L great toe amputation on 10/01/19. PMHx: HTN, HLD, neuropathy, borderline DM.   OT comments  Pt requiring mod assist to stand in order to adhere to new NWB status on L foot. Able to maintain NWB as he hopped 5 feet from bed to chair with RW with min assist. Began educating pt in compensatory strategies for LB bathing and dressing leaning side to side. Pt lives alone and will need rehab prior to return home. Recommending SNF and pt is agreeable. Will continue to follow.  Follow Up Recommendations  SNF;Supervision/Assistance - 24 hour    Equipment Recommendations  Other (comment) (defer to next venue)    Recommendations for Other Services      Precautions / Restrictions Precautions Precautions: Fall Required Braces or Orthoses: Other Brace Other Brace: post op shoe on L Restrictions Weight Bearing Restrictions: Yes LLE Weight Bearing: Non weight bearing       Mobility Bed Mobility Overal bed mobility: Modified Independent                Transfers Overall transfer level: Needs assistance Equipment used: Rolling walker (2 wheeled) Transfers: Sit to/from Stand Sit to Stand: From elevated surface;Mod assist         General transfer comment: cues for hand and L foot placement, assist to rise and steady from elevated bed    Balance Overall balance assessment: Needs assistance   Sitting balance-Leahy Scale: Good       Standing balance-Leahy Scale: Poor                             ADL either performed or assessed with clinical judgement   ADL Overall ADL's : Needs assistance/impaired Eating/Feeding: Independent   Grooming: Wash/dry hands;Wash/dry face;Set up;Sitting   Upper Body Bathing: Set up;Sitting   Lower  Body Bathing: Moderate assistance;Sitting/lateral leans   Upper Body Dressing : Set up;Sitting   Lower Body Dressing: Moderate assistance;Sitting/lateral leans   Toilet Transfer: Minimal assistance;Ambulation;BSC;RW   Toileting- Clothing Manipulation and Hygiene: Minimal assistance;Sit to/from stand       Functional mobility during ADLs: Minimal assistance;Cueing for sequencing;Rolling walker General ADL Comments: Began educating pt on leaning side to side during LB ADL.     Vision       Perception     Praxis      Cognition Arousal/Alertness: Awake/alert Behavior During Therapy: WFL for tasks assessed/performed Overall Cognitive Status: Within Functional Limits for tasks assessed                                          Exercises     Shoulder Instructions       General Comments      Pertinent Vitals/ Pain       Pain Assessment: No/denies pain  Home Living                                          Prior Functioning/Environment              Frequency  Min 2X/week  Progress Toward Goals  OT Goals(current goals can now be found in the care plan section)  Progress towards OT goals: Progressing toward goals  Acute Rehab OT Goals Patient Stated Goal: agreeable to rehab prior to return home OT Goal Formulation: With patient Time For Goal Achievement: 10/15/19 Potential to Achieve Goals: Good  Plan Discharge plan needs to be updated    Co-evaluation                 AM-PAC OT "6 Clicks" Daily Activity     Outcome Measure   Help from another person eating meals?: None Help from another person taking care of personal grooming?: A Little Help from another person toileting, which includes using toliet, bedpan, or urinal?: A Lot Help from another person bathing (including washing, rinsing, drying)?: A Lot Help from another person to put on and taking off regular upper body clothing?: A Little Help from  another person to put on and taking off regular lower body clothing?: A Lot 6 Click Score: 16    End of Session Equipment Utilized During Treatment: Gait belt;Rolling walker;Other (comment) (post op shoe)  OT Visit Diagnosis: Unsteadiness on feet (R26.81);Other abnormalities of gait and mobility (R26.89)   Activity Tolerance Patient tolerated treatment well   Patient Left in chair;with call bell/phone within reach;with chair alarm set   Nurse Communication Mobility status        Time: 9643-8381 OT Time Calculation (min): 52 min  Charges: OT General Charges $OT Visit: 1 Visit OT Treatments $Self Care/Home Management : 38-52 mins  Peter James, OTR/L Acute Rehabilitation Services Pager: [phone] Office: [phone]   Peter James 10/02/2019, 12:15 PM

## 2019-10-02 NOTE — Evaluation (Signed)
Physical Therapy Evaluation Patient Details Name: Peter James. MRN: [id number] DOB: [birth year] Today's Date: 10/02/2019   History of Present Illness  Pt is a 86 yo male admitted 09/29/19 with L great toe osteomyelitis requiring L great toe amputation on 10/01/19. PMHx: HTN, HLD, neuropathy, borderline DM.  Clinical Impression  Pt admitted with above diagnosis. Pt very motivated to mobilize and reports that he is not used to sitting around at home. Pt lives alone, current recommendation in SNF and he prefers to go to [facility] as his daughter works there. Pt ambulated 10' in room with RW and min A. Min A for power up from chair and to control descent to chair.  Pt currently with functional limitations due to the deficits listed below (see PT Problem List). Pt will benefit from skilled PT to increase their independence and safety with mobility to allow discharge to the venue listed below.       Follow Up Recommendations SNF;Supervision/Assistance - 24 hour    Equipment Recommendations  Rolling walker with 5" wheels    Recommendations for Other Services       Precautions / Restrictions Precautions Precautions: Fall Required Braces or Orthoses: Other Brace Other Brace: post op shoe on L Restrictions Weight Bearing Restrictions: Yes LLE Weight Bearing: Non weight bearing      Mobility  Bed Mobility Overal bed mobility: Modified Independent             General bed mobility comments: pt received in chair  Transfers Overall transfer level: Needs assistance Equipment used: Rolling walker (2 wheeled) Transfers: Sit to/from Stand Sit to Stand: Min assist         General transfer comment: min A from recliner with use of arm rests. With return to sitting, pt required min A to control descent as well as vc's to hold LLE out in front to prevent WB/ing  Ambulation/Gait Ambulation/Gait assistance: Min assist Gait Distance (Feet): 10 Feet Assistive device: Rolling walker (2  wheeled) Gait Pattern/deviations: Step-to pattern Gait velocity: decreased Gait velocity interpretation: <1.31 ft/sec, indicative of household ambulator General Gait Details: donned wodden bottom shoe on R foot for cushion and protection. Allowed pt to put L heel on floor for balance but no wt through L side. Pt able to take small hops RLE. Cues for tricep activation and trunk extension during gait.   Stairs            Wheelchair Mobility    Modified Rankin (Stroke Patients Only)       Balance Overall balance assessment: Needs assistance Sitting-balance support: Single extremity supported;Feet supported Sitting balance-Leahy Scale: Good     Standing balance support: Bilateral upper extremity supported Standing balance-Leahy Scale: Poor Standing balance comment: reliant on UE support to safely stand without wt on LLE                             Pertinent Vitals/Pain Pain Assessment: No/denies pain    Home Living Family/patient expects to be discharged to:: Private residence Living Arrangements: Alone Available Help at Discharge: Family;Available PRN/intermittently Type of Home: House Home Access: Level entry     Home Layout: One level Home Equipment: Cane - single point      Prior Function Level of Independence: Independent with assistive device(s)         Comments: Independent with ADL; driivng; IADLs provided by daughter who lives very close by;     Hand Dominance   Dominant  Hand: Right    Extremity/Trunk Assessment   Upper Extremity Assessment Upper Extremity Assessment: Defer to OT evaluation    Lower Extremity Assessment Lower Extremity Assessment: LLE deficits/detail LLE Deficits / Details: hip flex 3+/5, knee ext 3+/5 LLE Sensation: decreased light touch;decreased proprioception LLE Coordination: decreased gross motor    Cervical / Trunk Assessment Cervical / Trunk Assessment: Normal  Communication   Communication: No  difficulties  Cognition Arousal/Alertness: Awake/alert Behavior During Therapy: WFL for tasks assessed/performed Overall Cognitive Status: Within Functional Limits for tasks assessed                                        General Comments General comments (skin integrity, edema, etc.): VSS. Pt hoping to go to SNF in [place] as it is close to his home in [place] and his daughter works in [employer] [place] Extremity Ankle Circles/Pumps: AROM;Both;20 reps;Seated   Assessment/Plan    PT Assessment Patient needs continued PT services  PT Problem List Decreased strength;Decreased balance;Decreased mobility;Decreased coordination;Decreased knowledge of use of DME;Decreased knowledge of precautions;Impaired sensation       PT Treatment Interventions DME instruction;Gait training;Functional mobility training;Therapeutic activities;Therapeutic exercise;Balance training;Patient/family education    PT Goals (Current goals can be found in the Care Plan section)  Acute Rehab PT Goals Patient Stated Goal: agreeable to rehab prior to return home PT Goal Formulation: With patient Time For Goal Achievement: 10/16/19 Potential to Achieve Goals: Good    Frequency Min 3X/week   Barriers to discharge Decreased caregiver support pt lives alone    Co-evaluation               AM-PAC PT "6 Clicks" Mobility  Outcome Measure Help needed turning from your back to your side while in a flat bed without using bedrails?: None Help needed moving from lying on your back to sitting on the side of a flat bed without using bedrails?: A Little Help needed moving to and from a bed to a chair (including a wheelchair)?: A Little Help needed standing up from a chair using your arms (e.g., wheelchair or bedside chair)?: A Little Help needed to walk in hospital room?: A Little Help needed climbing 3-5 steps with a railing? : Total 6 Click Score: 17    End of  Session Equipment Utilized During Treatment: Gait belt Activity Tolerance: Patient tolerated treatment well Patient left: in chair;with call bell/phone within reach;with chair alarm set Nurse Communication: Mobility status PT Visit Diagnosis: Unsteadiness on feet (R26.81);Difficulty in walking, not elsewhere classified (R26.2)    Time: 6599-3570 PT Time Calculation (min) (ACUTE ONLY): 23 min   Charges:   PT Evaluation $PT Eval Moderate Complexity: 1 Mod PT Treatments $Gait Training: 8-22 mins        Leighton Roach, [place]  Pager [phone] Office [place] 10/02/2019, 3:18 PM

## 2019-10-03 LAB — CBC WITH DIFFERENTIAL/PLATELET
Abs Immature Granulocytes: 0.06 10*3/uL (ref 0.00–0.07)
Basophils Absolute: 0 10*3/uL (ref 0.0–0.1)
Basophils Relative: 1 %
Eosinophils Absolute: 0.1 10*3/uL (ref 0.0–0.5)
Eosinophils Relative: 2 %
HCT: 36.2 % — ABNORMAL LOW (ref 39.0–52.0)
Hemoglobin: 11.9 g/dL — ABNORMAL LOW (ref 13.0–17.0)
Immature Granulocytes: 1 %
Lymphocytes Relative: 22 %
Lymphs Abs: 2 10*3/uL (ref 0.7–4.0)
MCH: 29.6 pg (ref 26.0–34.0)
MCHC: 32.9 g/dL (ref 30.0–36.0)
MCV: 90 fL (ref 80.0–100.0)
Monocytes Absolute: 0.7 10*3/uL (ref 0.1–1.0)
Monocytes Relative: 8 %
Neutro Abs: 6 10*3/uL (ref 1.7–7.7)
Neutrophils Relative %: 66 %
Platelets: 329 10*3/uL (ref 150–400)
RBC: 4.02 MIL/uL — ABNORMAL LOW (ref 4.22–5.81)
RDW: 13.6 % (ref 11.5–15.5)
WBC: 8.8 10*3/uL (ref 4.0–10.5)
nRBC: 0 % (ref 0.0–0.2)

## 2019-10-03 LAB — PROCALCITONIN: Procalcitonin: <0.1 ng/mL

## 2019-10-03 LAB — COMPREHENSIVE METABOLIC PANEL
ALT: 23 U/L (ref 0–44)
AST: 28 U/L (ref 15–41)
Albumin: 3.1 g/dL — ABNORMAL LOW (ref 3.5–5.0)
Alkaline Phosphatase: 58 U/L (ref 38–126)
Anion gap: 10 (ref 5–15)
BUN: 25 mg/dL — ABNORMAL HIGH (ref 8–23)
CO2: 21 mmol/L — ABNORMAL LOW (ref 22–32)
Calcium: 9.1 mg/dL (ref 8.9–10.3)
Chloride: 106 mmol/L (ref 98–111)
Creatinine, Ser: 1.35 mg/dL — ABNORMAL HIGH (ref 0.61–1.24)
GFR calc Af Amer: 52 mL/min — ABNORMAL LOW (ref >60)
GFR calc non Af Amer: 45 mL/min — ABNORMAL LOW (ref >60)
Glucose, Bld: 93 mg/dL (ref 70–99)
Potassium: 4.5 mmol/L (ref 3.5–5.1)
Sodium: 137 mmol/L (ref 135–145)
Total Bilirubin: 0.2 mg/dL — ABNORMAL LOW (ref 0.3–1.2)
Total Protein: 6.9 g/dL (ref 6.5–8.1)

## 2019-10-03 LAB — GLUCOSE, CAPILLARY
Glucose-Capillary: 99 mg/dL (ref 70–99)
Glucose-Capillary: 104 mg/dL — ABNORMAL HIGH (ref 70–99)
Glucose-Capillary: 158 mg/dL — ABNORMAL HIGH (ref 70–99)
Glucose-Capillary: 88 mg/dL (ref 70–99)
Glucose-Capillary: 195 mg/dL — ABNORMAL HIGH (ref 70–99)

## 2019-10-03 LAB — C-REACTIVE PROTEIN: CRP: 0.8 mg/dL (ref <1.0)

## 2019-10-03 LAB — MAGNESIUM: Magnesium: 1.9 mg/dL (ref 1.7–2.4)

## 2019-10-03 LAB — BRAIN NATRIURETIC PEPTIDE: B Natriuretic Peptide: 85.3 pg/mL (ref 0.0–100.0)

## 2019-10-03 MED ORDER — INSULIN ASPART 100 UNIT/ML ~~LOC~~ SOLN
0.0000 [IU] | Freq: Three times a day (TID) | SUBCUTANEOUS | Status: DC
  Administered 2019-10-04: 1 [IU] via SUBCUTANEOUS
  Administered 2019-10-05: 2 [IU] via SUBCUTANEOUS
  Administered 2019-10-06 – 2019-10-07 (×3): 1 [IU] via SUBCUTANEOUS
  Administered 2019-10-08: 2 [IU] via SUBCUTANEOUS

## 2019-10-03 MED ORDER — FLEET ENEMA 7-19 GM/118ML RE ENEM
1.0000 | ENEMA | Freq: Every day | RECTAL | Status: DC | PRN
  Filled 2019-10-03: qty 1

## 2019-10-03 NOTE — Progress Notes (Signed)
PROGRESS NOTE    Peter James.  DDU:[id number] DOB: 1926/03/04 DOA: 09/29/2019 PCP: [name], MD    Brief Narrative:  Peter Jamesis a 86 y.o.malewith medical history significant fortype 2 diabetes, CKD stage III, nonobstructive CAD, PAD, hypertension, hyperlipidemia, and remote history of bladder cancer who presents to the ED for evaluation of infection of his left first toe, no reported exertional dyspnea ongoing for a few weeks.  In the hospital he was diagnosed with left toe infection and admitted to the hospital.    Consultants:   Orthopedics  Procedures:   Antimicrobials:   Cefepime   Subjective: Denies leg pain however complaining of constipation no other complaints  Objective: Vitals:   10/02/19 1546 10/02/19 1949 10/03/19 0430 10/03/19 0739  BP: 137/67 140/62 (!) 150/73 (!) 156/92  Pulse: 86 87 79 81  Resp: 18 17 16 17   Temp: 98.4 F (36.9 C) 98.7 F (37.1 C) 98.4 F (36.9 C) 98.3 F (36.8 C)  TempSrc: Oral Oral Oral Oral  SpO2: 100% 100% 96% 95%  Weight:      Height:        Intake/Output Summary (Last 24 hours) at 10/03/2019 1455 Last data filed at 10/03/2019 0900 Gross per 24 hour  Intake 1605.71 ml  Output 1000 ml  Net 605.71 ml   Filed Weights   09/29/19 1939 10/01/19 1216  Weight: 98.4 kg 98.4 kg    Examination:  General exam: Appears calm and comfortable  Respiratory system: Clear to auscultation. Respiratory effort normal. Cardiovascular system: S1 & S2 heard, RRR. No JVD, murmurs, rubs, gallops or clicks.  Gastrointestinal system: Abdomen is nondistended, soft and nontender. Normal bowel sounds heard. Central nervous system: Alert and oriented. Grossly intact Extremities: no edema. LLE wrapped in dressing. Skin: Warm dry Psychiatry: Judgement and insight appear normal. Mood & affect appropriate.     Data Reviewed: I have personally reviewed following labs and imaging studies  CBC: Recent Labs  Lab  09/29/19 1448 09/30/19 0303 10/01/19 0151 10/02/19 0249 10/03/19 0342  WBC 7.8 5.7 6.0 6.6 8.8  NEUTROABS 5.1  --  3.9 5.1 6.0  HGB 12.6* 13.4 12.5* 11.7* 11.9*  HCT 39.1 41.1 37.9* 36.2* 36.2*  MCV 90.9 90.9 89.8 89.6 90.0  PLT 323 356 325 324 237   Basic Metabolic Panel: Recent Labs  Lab 09/29/19 1448 09/30/19 0303 10/01/19 0151 10/02/19 0249 10/03/19 0342  NA 135 136 133* 136 137  K 4.5 5.1 4.4 5.2* 4.5  CL 99 100 101 105 106  CO2 24 25 23 22  21*  GLUCOSE 200* 95 168* 144* 93  BUN 24* 23 20 25* 25*  CREATININE 1.57* 1.33* 1.34* 1.68* 1.35*  CALCIUM 9.3 9.7 9.0 9.2 9.1  MG  --   --  2.0 1.9 1.9   GFR: Estimated Creatinine Clearance: 43.1 mL/min (A) (by C-G formula based on SCr of 1.35 mg/dL (H)). Liver Function Tests: Recent Labs  Lab 09/29/19 1448 10/01/19 0151 10/02/19 0249 10/03/19 0342  AST 21 17 23 28   ALT 20 16 22 23   ALKPHOS 79 68 66 58  BILITOT 0.3 0.6 0.3 0.2*  PROT 7.7 7.1 7.1 6.9  ALBUMIN 3.5 3.1* 2.9* 3.1*   No results for input(s): LIPASE, AMYLASE in the last 168 hours. No results for input(s): AMMONIA in the last 168 hours. Coagulation Profile: Recent Labs  Lab 09/30/19 1412  INR 1.0   Cardiac Enzymes: No results for input(s): CKTOTAL, CKMB, CKMBINDEX, TROPONINI in the last 168 hours.  BNP (last 3 results) No results for input(s): PROBNP in the last 8760 hours. HbA1C: No results for input(s): HGBA1C in the last 72 hours. CBG: Recent Labs  Lab 10/02/19 2119 10/02/19 2358 10/03/19 0435 10/03/19 0744 10/03/19 1206  GLUCAP 157* 130* 99 104* 158*   Lipid Profile: No results for input(s): CHOL, HDL, LDLCALC, TRIG, CHOLHDL, LDLDIRECT in the last 72 hours. Thyroid Function Tests: No results for input(s): TSH, T4TOTAL, FREET4, T3FREE, THYROIDAB in the last 72 hours. Anemia Panel: No results for input(s): VITAMINB12, FOLATE, FERRITIN, TIBC, IRON, RETICCTPCT in the last 72 hours. Sepsis Labs: Recent Labs  Lab 09/30/19 1412  10/01/19 0151 10/02/19 0249 10/03/19 0342  PROCALCITON <0.10 <0.10 <0.10 <0.10    Recent Results (from the past 240 hour(s))  Blood culture (routine x 2)     Status: None (Preliminary result)   Collection Time: 09/30/19  1:21 AM   Specimen: BLOOD RIGHT HAND  Result Value Ref Range Status   Specimen Description BLOOD RIGHT HAND  Final   Special Requests   Final    BOTTLES DRAWN AEROBIC AND ANAEROBIC Blood Culture adequate volume   Culture   Final    NO GROWTH 3 DAYS Performed at [facility], [place] [address] [place], [place] [address]    Report Status PENDING  Incomplete  Blood culture (routine x 2)     Status: None (Preliminary result)   Collection Time: 09/30/19  1:24 AM   Specimen: BLOOD  Result Value Ref Range Status   Specimen Description BLOOD LEFT ANTECUBITAL  Final   Special Requests   Final    BOTTLES DRAWN AEROBIC AND ANAEROBIC Blood Culture adequate volume   Culture   Final    NO GROWTH 3 DAYS Performed at [facility], [place] [address], [place], [place] [address]    Report Status PENDING  Incomplete  SARS Coronavirus 2 by RT PCR (hospital order, performed in [facility] hospital lab) Nasopharyngeal Nasopharyngeal Swab     Status: None   Collection Time: 09/30/19  2:23 AM   Specimen: Nasopharyngeal Swab  Result Value Ref Range Status   SARS Coronavirus 2 NEGATIVE NEGATIVE Final    Comment: (NOTE) SARS-CoV-2 target nucleic acids are NOT DETECTED.  The SARS-CoV-2 RNA is generally detectable in upper and lower respiratory specimens during the acute phase of infection. The lowest concentration of SARS-CoV-2 viral copies this assay can detect is 250 copies / mL. A negative result does not preclude SARS-CoV-2 infection and should not be used as the sole basis for treatment or other patient management decisions.  A negative result may occur with improper specimen collection / handling, submission of specimen other than nasopharyngeal swab, presence of viral  mutation(s) within the areas targeted by this assay, and inadequate number of viral copies (<250 copies / mL). A negative result must be combined with clinical observations, patient history, and epidemiological information.  Fact Sheet for Patients:   StrictlyIdeas.no  Fact Sheet for Healthcare Providers: BankingDealers.co.za  This test is not yet approved or  cleared by the [place] FDA and has been authorized for detection and/or diagnosis of SARS-CoV-2 by FDA under an Emergency Use Authorization (EUA).  This EUA will remain in effect (meaning this test can be used) for the duration of the COVID-19 declaration under Section 564(b)(1) of the Act, 21 U.S.C. section 360bbb-3(b)(1), unless the authorization is terminated or revoked sooner.  Performed at [facility], [place] [address], [place], [place] [address]  Surgical pcr screen     Status: None   Collection Time: 09/30/19  7:11 PM   Specimen: Nasal Mucosa; Nasal Swab  Result Value Ref Range Status   MRSA, PCR NEGATIVE NEGATIVE Final   Staphylococcus aureus NEGATIVE NEGATIVE Final    Comment: (NOTE) The Xpert SA Assay (FDA approved for NASAL specimens in patients 60 years of age and older), is one component of a comprehensive surveillance program. It is not intended to diagnose infection nor to guide or monitor treatment. Performed at [facility], [place] [address] [place], [place] [address]          Radiology Studies: No results found.      Scheduled Meds: . allopurinol  300 mg Oral Daily  . amLODipine  10 mg Oral Daily  . aspirin EC  81 mg Oral Daily  . atorvastatin  10 mg Oral QPM  . carvedilol  3.125 mg Oral BID WC  . heparin injection (subcutaneous)  5,000 Units Subcutaneous Q8H  . insulin aspart  0-9 Units Subcutaneous Q4H  . polyethylene glycol  17 g Oral Daily  . tamsulosin  0.4 mg Oral Daily   Continuous Infusions: . sodium chloride 75  mL/hr at 10/02/19 1500  . ceFEPime (MAXIPIME) IV 2 g (10/03/19 0850)  . vancomycin 750 mg (10/03/19 1239)    Assessment & Plan:   Principal Problem:   Osteomyelitis of great toe of left foot ([name]) Active Problems:   PAD (peripheral artery disease) ([name])   Hypertension associated with diabetes ([place])   Exertional dyspnea   CAD (coronary atherosclerotic disease)   Hyperlipidemia associated with type 2 diabetes mellitus ([name])   Type 2 diabetes mellitus ([name])   CKD (chronic kidney disease), stage III  Left big toe cellulitis, ulceration with osteomyelitis Patient was started on empiric antibiotics including cefepime and vancomycin.  Orthopedics was consulted.  Patient underwent amputation of the toes on 7/14.  Vascular ultrasound only showed moderate arterial disease.  Since he had surrounding cellulitis   Plan: Continue on iv abx until time for discharge switch to po PT/OT -Rec. SNF  Acute renal failure with mild hyperkalemia/CKD 3a Creatinine appears to baseline after iv hydration Continue to monitor. K 4.5 today.  Exertional dyspnea This has been ongoing for a few weeks.  Etiology is unclear.  BNP 57.3.  Echocardiogram does show diminished ejection fraction but patient without any signs of CHF.  Chest x-ray showed mild cardiomegaly without any other acute process.  Patient may benefit from being referred to cardiology in the outpatient setting.  This can be done by his PCP in [place], Dr. Woody Seller.  History of coronary artery disease and peripheral artery disease Continue aspirin and statin.  Low-dose beta-blocker was added.  EKG does show LBBB with the patient denies any chest pain.  Pursue medical management.  Outpatient referral to cardiology.  Chronic systolic CHF Ejection fraction noted to be 40-45%.   Euvolemic on exam without exacerbation  Continue carvedilol  continue carvedilol.   He is noted to have elevated creatinine so we will hold off on initiating ACE inhibitor or  ARB.   Due to advanced age likely not a candidate for invasive cardiac testing.   Will need to follow-up with cardiology as outpatient  Essential hypertension Continue with carvedilol and amlodipine  Continue BP monitoring   Constipation Started on bowel regimen Will add Enema prn  History of BPH On Flomax  Dyslipidemia Continue statin  History of gout Continue allopurinol  Diabetes mellitus type  2 HbA1c 6.9.  Continue SSI.  Home medication list show that he is on alogliptin which can be continued at discharge.  Normocytic anemia H/h stable.   No evidence of overt bleeding. Continue to monitor  DVT prophylaxis: Subcutaneous heparin CODE STATUS: DNR Family communication: None at bedside Disposition: SNF placement pending  Remains inpatient appropriate because:IV treatments appropriate due to intensity of illness or inability to take PO   Dispo:  Patient From: Home  Planned Disposition: SNF pending  Expected discharge date: TBD, Snf   Medically stable for discharge: No Pt still requiring IV abx, also needs snf placement.             LOS: 3 days   Time spent: 35 minutes with more than 50% on [place], MD [facility] Pager [phone]  If 7PM-7AM, please contact night-coverage [email or web address] Password [facility] 10/03/2019, 2:55 PM

## 2019-10-03 NOTE — Plan of Care (Signed)
  Problem: Activity: Goal: Risk for activity intolerance will decrease Outcome: Progressing   Problem: Pain Managment: Goal: General experience of comfort will improve Outcome: Progressing   Problem: Safety: Goal: Ability to remain free from injury will improve Outcome: Progressing   

## 2019-10-03 NOTE — Progress Notes (Signed)
Physical Therapy Treatment Patient Details Name: Peter James. MRN: [id number] DOB: July 17, 1925 Today's Date: 10/03/2019    History of Present Illness Pt is a 86 yo male admitted 09/29/19 with L great toe osteomyelitis requiring L great toe amputation on 10/01/19. PMHx: HTN, HLD, neuropathy, borderline DM.    PT Comments    Pt seated in chair on arrival this session.  Pt required assistance to mobilize with min-mod A.  He fatigues quickly with activity.  He was able to maintain weight bearing well.  Continue to recommend snf placement to maximize functional gains before returning home.       Follow Up Recommendations  SNF;Supervision/Assistance - 24 hour     Equipment Recommendations  Rolling walker with 5" wheels    Recommendations for Other Services       Precautions / Restrictions Precautions Precautions: Fall Required Braces or Orthoses: Other Brace Other Brace: post op shoe on L Restrictions Weight Bearing Restrictions: Yes LLE Weight Bearing: Non weight bearing Other Position/Activity Restrictions: Pt wear post op on R foot for support when hopping he would benefit from a sneaker on the R foot to improve support, stability and balance.    Mobility  Bed Mobility               General bed mobility comments: pt received in chair  Transfers Overall transfer level: Needs assistance Equipment used: Rolling walker (2 wheeled) Transfers: Sit to/from Stand Sit to Stand: Min assist;Mod assist         General transfer comment: Min A from recliner with use of arm rests. Mod assistance from straight back chair without arm rests.  Pt required assistance for eccentric loading.  Ambulation/Gait Ambulation/Gait assistance: Min assist Gait Distance (Feet): 12 Feet (x2) Assistive device: Rolling walker (2 wheeled) Gait Pattern/deviations: Step-to pattern Gait velocity: decreased   General Gait Details: Cues for sequencing and hop to pattern.  Tightened straps on  shoes for improved fit.  Pt able to increased activity this session.  Seated rest period between trials due to fatigue.   Stairs             Wheelchair Mobility    Modified Rankin (Stroke Patients Only)       Balance Overall balance assessment: Needs assistance   Sitting balance-Leahy Scale: Good       Standing balance-Leahy Scale: Poor Standing balance comment: reliant on UE support to safely stand without wt on LLE                            Cognition Arousal/Alertness: Awake/alert Behavior During Therapy: WFL for tasks assessed/performed Overall Cognitive Status: Within Functional Limits for tasks assessed                                        Exercises      General Comments        Pertinent Vitals/Pain      Home Living Family/patient expects to be discharged to:: Private residence Living Arrangements: Alone                  Prior Function            PT Goals (current goals can now be found in the care plan section) Acute Rehab PT Goals Patient Stated Goal: agreeable to rehab prior to return home Potential to Achieve Goals:  Good Progress towards PT goals: Progressing toward goals    Frequency    Min 3X/week      PT Plan Current plan remains appropriate    Co-evaluation              AM-PAC PT "6 Clicks" Mobility   Outcome Measure  Help needed turning from your back to your side while in a flat bed without using bedrails?: None Help needed moving from lying on your back to sitting on the side of a flat bed without using bedrails?: A Little Help needed moving to and from a bed to a chair (including a wheelchair)?: A Little Help needed standing up from a chair using your arms (e.g., wheelchair or bedside chair)?: A Little Help needed to walk in hospital room?: A Little Help needed climbing 3-5 steps with a railing? : Total 6 Click Score: 17    End of Session Equipment Utilized During Treatment:  Gait belt Activity Tolerance: Patient tolerated treatment well Patient left: in chair;with call bell/phone within reach;with chair alarm set Nurse Communication: Mobility status PT Visit Diagnosis: Unsteadiness on feet (R26.81);Difficulty in walking, not elsewhere classified (R26.2)     Time: 1455-1510 PT Time Calculation (min) (ACUTE ONLY): 15 min  Charges:  $Gait Training: 8-22 mins                     Erasmo Leventhal , PTA Acute Rehabilitation Services Pager [phone] Office [phone]     Chan Rosasco Eli Hose 10/03/2019, 3:25 PM

## 2019-10-03 NOTE — Progress Notes (Signed)
Pharmacy Antibiotic Note  Reagan Klemz. is a 86 y.o. male on day #4 Vancomycin and Cefepime for ulceration of L great toe/osteomyelitis. S/p L great toe amputation on 7/14. Had anticipated antibiotics stopping ~24hrs post-op but continued due to surrounding cellulitis. Has expected change to PO antibiotics today, but now to continue IV antibiotics.     Creatinine trended up on 7/15 but back to baseline today.    Afebrile, WBC normal, CRP normalized.  Plan:  Continue Vancomycin 750 mg IV q12h  Continue Cefepime 2 gm IV q12h  Follow renal function, progress and antibiotic plans.  Target vancomycin trough levels 15-20 mcg/ml  Will check vanc trough level if vanc continues more than a few more days, or if renal function changes.  Height: 6' 0.99" (185.4 cm) Weight: 98.4 kg (217 lb) IBW/kg (Calculated) : 79.88  Temp (24hrs), Avg:98.5 F (36.9 C), Min:98.3 F (36.8 C), Max:98.7 F (37.1 C)  Recent Labs  Lab 09/29/19 1448 09/30/19 0303 10/01/19 0151 10/02/19 0249 10/03/19 0342  WBC 7.8 5.7 6.0 6.6 8.8  CREATININE 1.57* 1.33* 1.34* 1.68* 1.35*    Estimated Creatinine Clearance: 43.1 mL/min (A) (by C-G formula based on SCr of 1.35 mg/dL (H)).    No Known Allergies  Antimicrobials this admission: Vancomycin 7/13>> Cefepime 7/13>> Cefazolin x 1 on 7/14 pre-op  Dose adjustments this admission:  n/a  Microbiology results: 7/13 Blood x 2: no growth x 3 days to date 7/13 MRSA PCR: negative 7/13 COVID: negative  Thank you for allowing pharmacy to be a part of this patient's care.  Arty Baumgartner, [place] Phone: [phone] 10/03/2019 4:25 PM

## 2019-10-04 LAB — CBC WITH DIFFERENTIAL/PLATELET
Abs Immature Granulocytes: 0.09 10*3/uL — ABNORMAL HIGH (ref 0.00–0.07)
Basophils Absolute: 0 10*3/uL (ref 0.0–0.1)
Basophils Relative: 0 %
Eosinophils Absolute: 0.2 10*3/uL (ref 0.0–0.5)
Eosinophils Relative: 3 %
HCT: 36.8 % — ABNORMAL LOW (ref 39.0–52.0)
Hemoglobin: 12 g/dL — ABNORMAL LOW (ref 13.0–17.0)
Immature Granulocytes: 1 %
Lymphocytes Relative: 20 %
Lymphs Abs: 1.9 10*3/uL (ref 0.7–4.0)
MCH: 29.1 pg (ref 26.0–34.0)
MCHC: 32.6 g/dL (ref 30.0–36.0)
MCV: 89.1 fL (ref 80.0–100.0)
Monocytes Absolute: 0.8 10*3/uL (ref 0.1–1.0)
Monocytes Relative: 9 %
Neutro Abs: 6.1 10*3/uL (ref 1.7–7.7)
Neutrophils Relative %: 67 %
Platelets: 336 10*3/uL (ref 150–400)
RBC: 4.13 MIL/uL — ABNORMAL LOW (ref 4.22–5.81)
RDW: 13.9 % (ref 11.5–15.5)
WBC: 9.2 10*3/uL (ref 4.0–10.5)
nRBC: 0 % (ref 0.0–0.2)

## 2019-10-04 LAB — BASIC METABOLIC PANEL
Anion gap: 8 (ref 5–15)
BUN: 20 mg/dL (ref 8–23)
CO2: 24 mmol/L (ref 22–32)
Calcium: 9.4 mg/dL (ref 8.9–10.3)
Chloride: 107 mmol/L (ref 98–111)
Creatinine, Ser: 1.35 mg/dL — ABNORMAL HIGH (ref 0.61–1.24)
GFR calc Af Amer: 52 mL/min — ABNORMAL LOW (ref >60)
GFR calc non Af Amer: 45 mL/min — ABNORMAL LOW (ref >60)
Glucose, Bld: 106 mg/dL — ABNORMAL HIGH (ref 70–99)
Potassium: 4.3 mmol/L (ref 3.5–5.1)
Sodium: 139 mmol/L (ref 135–145)

## 2019-10-04 LAB — GLUCOSE, CAPILLARY
Glucose-Capillary: 95 mg/dL (ref 70–99)
Glucose-Capillary: 142 mg/dL — ABNORMAL HIGH (ref 70–99)
Glucose-Capillary: 95 mg/dL (ref 70–99)
Glucose-Capillary: 154 mg/dL — ABNORMAL HIGH (ref 70–99)

## 2019-10-04 LAB — BRAIN NATRIURETIC PEPTIDE: B Natriuretic Peptide: 72.7 pg/mL (ref 0.0–100.0)

## 2019-10-04 LAB — PROCALCITONIN: Procalcitonin: <0.1 ng/mL

## 2019-10-04 LAB — MAGNESIUM: Magnesium: 1.9 mg/dL (ref 1.7–2.4)

## 2019-10-04 LAB — C-REACTIVE PROTEIN: CRP: 0.6 mg/dL (ref <1.0)

## 2019-10-04 NOTE — Plan of Care (Signed)
  Problem: Education: Goal: Knowledge of General Education information will improve Description: Including pain rating scale, medication(s)/side effects and non-pharmacologic comfort measures Outcome: Progressing   Problem: Health Behavior/Discharge Planning: Goal: Ability to manage health-related needs will improve Outcome: Progressing   Problem: Clinical Measurements: Goal: Will remain free from infection Outcome: Progressing   Problem: Elimination: Goal: Will not experience complications related to bowel motility Outcome: Progressing   

## 2019-10-04 NOTE — Progress Notes (Signed)
PROGRESS NOTE    Peter James.  GNF:[id number] DOB: 1925-05-01 DOA: 09/29/2019 PCP: [name], MD    Brief Narrative:  Peter Jamesis a 86 y.o.malewith medical history significant fortype 2 diabetes, CKD stage III, nonobstructive CAD, PAD, hypertension, hyperlipidemia, and remote history of bladder cancer who presents to the ED for evaluation of infection of his left first toe, no reported exertional dyspnea ongoing for a few weeks.  In the hospital he was diagnosed with left toe infection and admitted to the hospital.    Consultants:   Orthopedics  Procedures:   Antimicrobials:   Cefepime   Subjective: Has no complaints.  Had a bowel movement.  Objective: Vitals:   10/03/19 0739 10/03/19 1704 10/03/19 1951 10/04/19 0610  BP: (!) 156/92 (!) 148/75 133/74 (!) 158/79  Pulse: 81 78 83 86  Resp: 17  16 16   Temp: 98.3 F (36.8 C)  98.5 F (36.9 C) 98.6 F (37 C)  TempSrc: Oral  Oral Oral  SpO2: 95%  100% 98%  Weight:      Height:        Intake/Output Summary (Last 24 hours) at 10/04/2019 1353 Last data filed at 10/04/2019 1300 Gross per 24 hour  Intake 720 ml  Output 1300 ml  Net -580 ml   Filed Weights   09/29/19 1939 10/01/19 1216  Weight: 98.4 kg 98.4 kg    Examination:  General exam: Calm and comfortable, NAD Respiratory system: CTA, no R/R/W  Cardiovascular system: Regular, S1-S2 no murmurs rubs gallops .  Gastrointestinal system: Soft, ND/NT/positive bowel sounds  Central nervous system: Alert oriented x3, grossly intact Extremities: Left lower extremity wrapped in dressing no edema Skin: Warm dry Psychiatry: Mood and affect appropriate in current setting     Data Reviewed: I have personally reviewed following labs and imaging studies  CBC: Recent Labs  Lab 09/29/19 1448 09/29/19 1448 09/30/19 0303 10/01/19 0151 10/02/19 0249 10/03/19 0342 10/04/19 0749  WBC 7.8   < > 5.7 6.0 6.6 8.8 9.2  NEUTROABS 5.1  --   --  3.9  5.1 6.0 6.1  HGB 12.6*   < > 13.4 12.5* 11.7* 11.9* 12.0*  HCT 39.1   < > 41.1 37.9* 36.2* 36.2* 36.8*  MCV 90.9   < > 90.9 89.8 89.6 90.0 89.1  PLT 323   < > 356 325 324 329 336   < > = values in this interval not displayed.   Basic Metabolic Panel: Recent Labs  Lab 09/30/19 0303 10/01/19 0151 10/02/19 0249 10/03/19 0342 10/04/19 0749  NA 136 133* 136 137 139  K 5.1 4.4 5.2* 4.5 4.3  CL 100 101 105 106 107  CO2 25 23 22  21* 24  GLUCOSE 95 168* 144* 93 106*  BUN 23 20 25* 25* 20  CREATININE 1.33* 1.34* 1.68* 1.35* 1.35*  CALCIUM 9.7 9.0 9.2 9.1 9.4  MG  --  2.0 1.9 1.9 1.9   GFR: Estimated Creatinine Clearance: 43.1 mL/min (A) (by C-G formula based on SCr of 1.35 mg/dL (H)). Liver Function Tests: Recent Labs  Lab 09/29/19 1448 10/01/19 0151 10/02/19 0249 10/03/19 0342  AST 21 17 23 28   ALT 20 16 22 23   ALKPHOS 79 68 66 58  BILITOT 0.3 0.6 0.3 0.2*  PROT 7.7 7.1 7.1 6.9  ALBUMIN 3.5 3.1* 2.9* 3.1*   No results for input(s): LIPASE, AMYLASE in the last 168 hours. No results for input(s): AMMONIA in the last 168 hours. Coagulation Profile: Recent  Labs  Lab 09/30/19 1412  INR 1.0   Cardiac Enzymes: No results for input(s): CKTOTAL, CKMB, CKMBINDEX, TROPONINI in the last 168 hours. BNP (last 3 results) No results for input(s): PROBNP in the last 8760 hours. HbA1C: No results for input(s): HGBA1C in the last 72 hours. CBG: Recent Labs  Lab 10/03/19 1206 10/03/19 1647 10/03/19 1951 10/04/19 0643 10/04/19 1130  GLUCAP 158* 88 195* 95 142*   Lipid Profile: No results for input(s): CHOL, HDL, LDLCALC, TRIG, CHOLHDL, LDLDIRECT in the last 72 hours. Thyroid Function Tests: No results for input(s): TSH, T4TOTAL, FREET4, T3FREE, THYROIDAB in the last 72 hours. Anemia Panel: No results for input(s): VITAMINB12, FOLATE, FERRITIN, TIBC, IRON, RETICCTPCT in the last 72 hours. Sepsis Labs: Recent Labs  Lab 10/01/19 0151 10/02/19 0249 10/03/19 0342  10/04/19 0749  PROCALCITON <0.10 <0.10 <0.10 <0.10    Recent Results (from the past 240 hour(s))  Blood culture (routine x 2)     Status: None (Preliminary result)   Collection Time: 09/30/19  1:21 AM   Specimen: BLOOD RIGHT HAND  Result Value Ref Range Status   Specimen Description BLOOD RIGHT HAND  Final   Special Requests   Final    BOTTLES DRAWN AEROBIC AND ANAEROBIC Blood Culture adequate volume   Culture   Final    NO GROWTH 4 DAYS Performed at [facility], [place] [address] [place], [place] [address]    Report Status PENDING  Incomplete  Blood culture (routine x 2)     Status: None (Preliminary result)   Collection Time: 09/30/19  1:24 AM   Specimen: BLOOD  Result Value Ref Range Status   Specimen Description BLOOD LEFT ANTECUBITAL  Final   Special Requests   Final    BOTTLES DRAWN AEROBIC AND ANAEROBIC Blood Culture adequate volume   Culture   Final    NO GROWTH 4 DAYS Performed at [facility], [place] [address], [place], [place] [address]    Report Status PENDING  Incomplete  SARS Coronavirus 2 by RT PCR (hospital order, performed in [facility] hospital lab) Nasopharyngeal Nasopharyngeal Swab     Status: None   Collection Time: 09/30/19  2:23 AM   Specimen: Nasopharyngeal Swab  Result Value Ref Range Status   SARS Coronavirus 2 NEGATIVE NEGATIVE Final    Comment: (NOTE) SARS-CoV-2 target nucleic acids are NOT DETECTED.  The SARS-CoV-2 RNA is generally detectable in upper and lower respiratory specimens during the acute phase of infection. The lowest concentration of SARS-CoV-2 viral copies this assay can detect is 250 copies / mL. A negative result does not preclude SARS-CoV-2 infection and should not be used as the sole basis for treatment or other patient management decisions.  A negative result may occur with improper specimen collection / handling, submission of specimen other than nasopharyngeal swab, presence of viral mutation(s) within the areas  targeted by this assay, and inadequate number of viral copies (<250 copies / mL). A negative result must be combined with clinical observations, patient history, and epidemiological information.  Fact Sheet for Patients:   StrictlyIdeas.no  Fact Sheet for Healthcare Providers: BankingDealers.co.za  This test is not yet approved or  cleared by the [place] FDA and has been authorized for detection and/or diagnosis of SARS-CoV-2 by FDA under an Emergency Use Authorization (EUA).  This EUA will remain in effect (meaning this test can be used) for the duration of the COVID-19 declaration under Section 564(b)(1) of the Act, 21 U.S.C. section  360bbb-3(b)(1), unless the authorization is terminated or revoked sooner.  Performed at [facility], [place] [address], [place], [place] [address]   Surgical pcr screen     Status: None   Collection Time: 09/30/19  7:11 PM   Specimen: Nasal Mucosa; Nasal Swab  Result Value Ref Range Status   MRSA, PCR NEGATIVE NEGATIVE Final   Staphylococcus aureus NEGATIVE NEGATIVE Final    Comment: (NOTE) The Xpert SA Assay (FDA approved for NASAL specimens in patients 11 years of age and older), is one component of a comprehensive surveillance program. It is not intended to diagnose infection nor to guide or monitor treatment. Performed at [facility], [place] [address], [place], [place] [address]          Radiology Studies: No results found.      Scheduled Meds: . allopurinol  300 mg Oral Daily  . amLODipine  10 mg Oral Daily  . aspirin EC  81 mg Oral Daily  . atorvastatin  10 mg Oral QPM  . carvedilol  3.125 mg Oral BID WC  . heparin injection (subcutaneous)  5,000 Units Subcutaneous Q8H  . insulin aspart  0-9 Units Subcutaneous TID WC  . polyethylene glycol  17 g Oral Daily  . tamsulosin  0.4 mg Oral Daily   Continuous Infusions: . sodium chloride 75 mL/hr at 10/02/19 1500  .  ceFEPime (MAXIPIME) IV 2 g (10/04/19 1005)  . vancomycin 750 mg (10/04/19 1303)    Assessment & Plan:   Principal Problem:   Osteomyelitis of great toe of left foot ([name]) Active Problems:   PAD (peripheral artery disease) ([name])   Hypertension associated with diabetes ([place])   Exertional dyspnea   CAD (coronary atherosclerotic disease)   Hyperlipidemia associated with type 2 diabetes mellitus ([name])   Type 2 diabetes mellitus ([name])   CKD (chronic kidney disease), stage III  Left big toe cellulitis, ulceration with osteomyelitis Patient was started on empiric antibiotics including cefepime and vancomycin.  Orthopedics was consulted.  Patient underwent amputation of the toes on 7/14.  Vascular ultrasound only showed moderate arterial disease.  Since he had surrounding cellulitis   Plan: Continue with IV antibiotics until time of discharge to switch to p.o.  needs SNF per PT 5 inch wheels roller walker   Acute renal failure with mild hyperkalemia/CKD 3a Creatinine appears to baseline after iv hydration K is 4.3 and creatinine is 1.35 today We will continue to monitor   Exertional dyspnea This has been ongoing for a few weeks.  Etiology is unclear.  BNP 57.3.  Echocardiogram does show diminished ejection fraction but patient without any signs of CHF.  Chest x-ray showed mild cardiomegaly without any other acute process.  Patient may benefit from being referred to cardiology in the outpatient setting.  This can be done by his PCP in [place], Dr. Woody Seller.  History of coronary artery disease and peripheral artery disease Continue aspirin and statin.  Low-dose beta-blocker was added.  EKG does show LBBB with the patient denies any chest pain.  Pursue medical management.  Needs to follow-up with cardiology as outpatient  Chronic systolic CHF Ejection fraction noted to be 40-45%.   Euvolemic on exam without exacerbation  Continue carvedilol  He is noted to have elevated creatinine so we  will hold off on initiating ACE inhibitor or ARB.   Due to advanced age likely not a candidate for invasive cardiac testing.   Plan to follow-up with outpatient cardiology  Essential hypertension  Overall stable  Continue with carvedilol and amlodipine      Constipation Started on bowel regimen  Enema prn  History of BPH On Flomax  Dyslipidemia Continue statin  History of gout Continue allopurinol  Diabetes mellitus type 2 HbA1c 6.9.  Continue SSI.  Home medication list show that he is on alogliptin which can be continued at discharge.  Normocytic anemia H/h stable.   No evidence of overt bleeding. Continue to monitor  DVT prophylaxis: Subcutaneous heparin CODE STATUS: DNR Family communication: None at bedside Disposition: SNF placement pending  Remains inpatient appropriate because:IV treatments appropriate due to intensity of illness or inability to take PO   Dispo:  Patient From: Home  Planned Disposition: SNF pending  Expected discharge date: TBD, Snf   Medically stable for discharge: No Pt still requiring IV abx, also needs snf placement.             LOS: 4 days   Time spent: 35 minutes with more than 50% on [place], MD [facility] Pager [phone]  If 7PM-7AM, please contact night-coverage [email or web address] Password [email or web address] 10/04/2019, 1:53 PM

## 2019-10-05 LAB — CULTURE, BLOOD (ROUTINE X 2)
Culture: NO GROWTH
Culture: NO GROWTH
Special Requests: ADEQUATE
Special Requests: ADEQUATE

## 2019-10-05 LAB — GLUCOSE, CAPILLARY
Glucose-Capillary: 115 mg/dL — ABNORMAL HIGH (ref 70–99)
Glucose-Capillary: 151 mg/dL — ABNORMAL HIGH (ref 70–99)
Glucose-Capillary: 130 mg/dL — ABNORMAL HIGH (ref 70–99)
Glucose-Capillary: 127 mg/dL — ABNORMAL HIGH (ref 70–99)

## 2019-10-05 LAB — VANCOMYCIN, TROUGH: Vancomycin Tr: 22 ug/mL (ref 15–20)

## 2019-10-05 MED ORDER — SENNOSIDES-DOCUSATE SODIUM 8.6-50 MG PO TABS
2.0000 | ORAL_TABLET | Freq: Two times a day (BID) | ORAL | Status: DC
  Administered 2019-10-05 – 2019-10-09 (×9): 2 via ORAL
  Filled 2019-10-05 (×9): qty 2

## 2019-10-05 MED ORDER — VANCOMYCIN HCL 1250 MG/250ML IV SOLN
1250.0000 mg | INTRAVENOUS | Status: DC
  Filled 2019-10-05: qty 250

## 2019-10-05 NOTE — Progress Notes (Signed)
Vancomycin Trough 22   Pharmacy notified and will place new orders

## 2019-10-05 NOTE — Progress Notes (Signed)
Pharmacy Antibiotic Note  Peter James. is a 86 y.o. male on day #4 Vancomycin and Cefepime for ulceration of L great toe/osteomyelitis. S/p L great toe amputation on 7/14. Had anticipated antibiotics stopping ~24hrs post-op but continued due to surrounding cellulitis.   Vancomycin trough 22 mcg/ml (supratherapeutic) on 750mg  IV q12h. SCr remains stable.  Plan: Change Vancomycin to 1250 mg IV q24h (expected trough ~15) Continue Cefepime 2 gm IV q12h Target vancomycin trough levels 15-20 mcg/ml Will f/u renal function, micro data, and levels at new Css of Vanc continues   Height: 6' 0.99" (185.4 cm) Weight: 98.4 kg (217 lb) IBW/kg (Calculated) : 79.88  Temp (24hrs), Avg:98.4 F (36.9 C), Min:98.2 F (36.8 C), Max:98.6 F (37 C)  Recent Labs  Lab 09/30/19 0303 10/01/19 0151 10/02/19 0249 10/03/19 0342 10/04/19 0749 10/05/19 2159  WBC 5.7 6.0 6.6 8.8 9.2  --   CREATININE 1.33* 1.34* 1.68* 1.35* 1.35*  --   VANCOTROUGH  --   --   --   --   --  22*    Estimated Creatinine Clearance: 43.1 mL/min (A) (by C-G formula based on SCr of 1.35 mg/dL (H)).    No Known Allergies  Antimicrobials this admission: Vancomycin 7/13>> Cefepime 7/13>> Cefazolin x 1 on 7/14 pre-op  Dose adjustments this admission: Vanc 750mg  IV q12h - VT 22 mcg/ml >>change to 1250mg  IV q24h  Microbiology results: 7/13 Blood x 2: no growth x 3 days to date 7/13 MRSA PCR: negative 7/13 COVID: negative  Thank you for allowing pharmacy to be a part of this patient's care.  Sherlon Handing, PharmD, BCPS Please see amion for complete clinical pharmacist phone list 10/05/2019 11:05 PM

## 2019-10-05 NOTE — Progress Notes (Signed)
PROGRESS NOTE  Donalynn James. IRW:[id number] DOB: 09-Dec-1925 DOA: 09/29/2019 PCP: [name], MD  HPI/Recap of past 24 hours: Peter Jamesis a 86 y.o.malewith medical history significant fortype 2 diabetes, CKD stage III, nonobstructive CAD, PAD, hypertension, hyperlipidemia, and remote history of bladder cancer who presents to the ED for evaluation of infection of his left first toe, no reported exertional dyspnea ongoing for a few weeks. In the hospital he was diagnosed with left toe infection and admitted to the hospital.  10/05/19: Reports feeling constipated this morning.  Bowel regimen in place, Senokot added.   Assessment/Plan: Principal Problem:   Osteomyelitis of great toe of left foot ([name]) Active Problems:   PAD (peripheral artery disease) ([name])   Hypertension associated with diabetes ([name])   Exertional dyspnea   CAD (coronary atherosclerotic disease)   Hyperlipidemia associated with type 2 diabetes mellitus ([name])   Type 2 diabetes mellitus ([name])   CKD (chronic kidney disease), stage III  Left big toe cellulitis, ulceration with osteomyelitis Patient was started on empiric antibiotics including cefepime and IV vancomycin. Orthopedics was consulted. Patient underwent amputation of the toes on 7/14. Vascular ultrasound only showed moderate arterial disease. Since he had surrounding cellulitis   Plan: Continue with IV antibiotics until time of discharge to switch to p.o.  needs SNF per PT 5 inch wheels roller walker  Acute renal failure with mild hyperkalemia/CKD 3a Creatinine appears to baseline after iv hydration K is 4.3 and creatinine is 1.35 Creatinine 1.68, 3 days ago. Avoid nephrotoxins and hypotension Monitor urine output  Exertional dyspnea This has been ongoing for a few weeks. Etiology is unclear. BNP 57.3. Echocardiogram does show diminished ejection fraction but patient without any signs of CHF. Chest x-ray showed mild  cardiomegaly without any other acute process. Patient may benefit from being referred to cardiology in the outpatient setting. This can be done by his PCP in [place], Dr. Woody Seller.  History of coronary artery disease and peripheral artery disease Continue aspirin and statin. Low-dose beta-blocker was added. EKG does show LBBB with the patient denies any chest pain. Pursue medical management. Needs to follow-up with cardiology as outpatient  Chronic systolic CHF Ejection fraction noted to be 40-45%. Euvolemic on exam without exacerbation  Continue carvedilol  He is noted to have elevated creatinine so we will hold off on initiating ACE inhibitor or ARB.  Due to advanced age likely not a candidate for invasive cardiac testing.  Plan to follow-up with outpatient cardiology  Essential hypertension Overall stable  Continue with carvedilol and amlodipine   Constipation Continue bowel regimen, MiraLAX daily Added Senokot 2 tablets twice daily  Enema prn  History of BPH On Flomax  Dyslipidemia Continue statin  History of gout Continue allopurinol  Diabetes mellitus type 2 with hyperglycemia HbA1c 6.9. Continue SSI. Home medication list show that he is on alogliptin which can be continued at discharge.  Normocytic anemia H/h stable.  No evidence of overt bleeding. Continue to monitor  DVT prophylaxis:Subcutaneous heparin 3 times daily CODE STATUS:DNR Family communication: None at bedside Disposition: SNF placement pending  Remains inpatient appropriate because:IV treatments appropriate due to intensity of illness or inability to take PO   Dispo:             Patient From: Home             Planned Disposition: SNF pending             Expected discharge date: 10/06/2019  Medically stable for discharge: No Pt still requiring IV abx, also needs snf placement.   Objective: Vitals:   10/04/19 0610 10/04/19 1524 10/04/19 1917 10/05/19 0259  BP:  (!) 158/79 139/73 (!) 140/56 130/85  Pulse: 86 78 75 81  Resp: 16 18 16 16   Temp: 98.6 F (37 C) 97.9 F (36.6 C) 98.7 F (37.1 C) 98.2 F (36.8 C)  TempSrc: Oral Oral Oral Oral  SpO2: 98% 100% 97% 100%  Weight:      Height:        Intake/Output Summary (Last 24 hours) at 10/05/2019 0746 Last data filed at 10/05/2019 0300 Gross per 24 hour  Intake 720 ml  Output 1700 ml  Net -980 ml   Filed Weights   09/29/19 1939 10/01/19 1216  Weight: 98.4 kg 98.4 kg    Exam:  . General: 86 y.o. year-old male well developed well nourished in no acute distress.  Alert and interactive. . Cardiovascular: Regular rate and rhythm with no rubs or gallops.  No thyromegaly or JVD noted.   [name] Respiratory: Clear to auscultation with no wheezes or rales. Good inspiratory effort. . Abdomen: Soft nontender nondistended with normal bowel sounds x4 quadrants. . Musculoskeletal: No lower extremity edema. [name] Psychiatry: Mood is appropriate for condition and setting   Data Reviewed: CBC: Recent Labs  Lab 09/29/19 1448 09/29/19 1448 09/30/19 0303 10/01/19 0151 10/02/19 0249 10/03/19 0342 10/04/19 0749  WBC 7.8   < > 5.7 6.0 6.6 8.8 9.2  NEUTROABS 5.1  --   --  3.9 5.1 6.0 6.1  HGB 12.6*   < > 13.4 12.5* 11.7* 11.9* 12.0*  HCT 39.1   < > 41.1 37.9* 36.2* 36.2* 36.8*  MCV 90.9   < > 90.9 89.8 89.6 90.0 89.1  PLT 323   < > 356 325 324 329 336   < > = values in this interval not displayed.   Basic Metabolic Panel: Recent Labs  Lab 09/30/19 0303 10/01/19 0151 10/02/19 0249 10/03/19 0342 10/04/19 0749  NA 136 133* 136 137 139  K 5.1 4.4 5.2* 4.5 4.3  CL 100 101 105 106 107  CO2 25 23 22  21* 24  GLUCOSE 95 168* 144* 93 106*  BUN 23 20 25* 25* 20  CREATININE 1.33* 1.34* 1.68* 1.35* 1.35*  CALCIUM 9.7 9.0 9.2 9.1 9.4  MG  --  2.0 1.9 1.9 1.9   GFR: Estimated Creatinine Clearance: 43.1 mL/min (A) (by C-G formula based on SCr of 1.35 mg/dL (H)). Liver Function Tests: Recent Labs  Lab  09/29/19 1448 10/01/19 0151 10/02/19 0249 10/03/19 0342  AST 21 17 23 28   ALT 20 16 22 23   ALKPHOS 79 68 66 58  BILITOT 0.3 0.6 0.3 0.2*  PROT 7.7 7.1 7.1 6.9  ALBUMIN 3.5 3.1* 2.9* 3.1*   No results for input(s): LIPASE, AMYLASE in the last 168 hours. No results for input(s): AMMONIA in the last 168 hours. Coagulation Profile: Recent Labs  Lab 09/30/19 1412  INR 1.0   Cardiac Enzymes: No results for input(s): CKTOTAL, CKMB, CKMBINDEX, TROPONINI in the last 168 hours. BNP (last 3 results) No results for input(s): PROBNP in the last 8760 hours. HbA1C: No results for input(s): HGBA1C in the last 72 hours. CBG: Recent Labs  Lab 10/04/19 0643 10/04/19 1130 10/04/19 1627 10/04/19 2101 10/05/19 0644  GLUCAP 95 142* 95 154* 115*   Lipid Profile: No results for input(s): CHOL, HDL, LDLCALC, TRIG, CHOLHDL, LDLDIRECT in the last 72 hours.  Thyroid Function Tests: No results for input(s): TSH, T4TOTAL, FREET4, T3FREE, THYROIDAB in the last 72 hours. Anemia Panel: No results for input(s): VITAMINB12, FOLATE, FERRITIN, TIBC, IRON, RETICCTPCT in the last 72 hours. Urine analysis: No results found for: COLORURINE, APPEARANCEUR, LABSPEC, PHURINE, GLUCOSEU, HGBUR, BILIRUBINUR, KETONESUR, PROTEINUR, UROBILINOGEN, NITRITE, LEUKOCYTESUR Sepsis Labs: @LABRCNTIP (procalcitonin:4,lacticidven:4)  ) Recent Results (from the past 240 hour(s))  Blood culture (routine x 2)     Status: None (Preliminary result)   Collection Time: 09/30/19  1:21 AM   Specimen: BLOOD RIGHT HAND  Result Value Ref Range Status   Specimen Description BLOOD RIGHT HAND  Final   Special Requests   Final    BOTTLES DRAWN AEROBIC AND ANAEROBIC Blood Culture adequate volume   Culture   Final    NO GROWTH 4 DAYS Performed at [facility], [place] [address] [place], [place] [address]    Report Status PENDING  Incomplete  Blood culture (routine x 2)     Status: None (Preliminary result)   Collection Time:  09/30/19  1:24 AM   Specimen: BLOOD  Result Value Ref Range Status   Specimen Description BLOOD LEFT ANTECUBITAL  Final   Special Requests   Final    BOTTLES DRAWN AEROBIC AND ANAEROBIC Blood Culture adequate volume   Culture   Final    NO GROWTH 4 DAYS Performed at [facility], [place] [address] [place], [place] [address]    Report Status PENDING  Incomplete  SARS Coronavirus 2 by RT PCR (hospital order, performed in [facility] hospital lab) Nasopharyngeal Nasopharyngeal Swab     Status: None   Collection Time: 09/30/19  2:23 AM   Specimen: Nasopharyngeal Swab  Result Value Ref Range Status   SARS Coronavirus 2 NEGATIVE NEGATIVE Final    Comment: (NOTE) SARS-CoV-2 target nucleic acids are NOT DETECTED.  The SARS-CoV-2 RNA is generally detectable in upper and lower respiratory specimens during the acute phase of infection. The lowest concentration of SARS-CoV-2 viral copies this assay can detect is 250 copies / mL. A negative result does not preclude SARS-CoV-2 infection and should not be used as the sole basis for treatment or other patient management decisions.  A negative result may occur with improper specimen collection / handling, submission of specimen other than nasopharyngeal swab, presence of viral mutation(s) within the areas targeted by this assay, and inadequate number of viral copies (<250 copies / mL). A negative result must be combined with clinical observations, patient history, and epidemiological information.  Fact Sheet for Patients:   StrictlyIdeas.no  Fact Sheet for Healthcare Providers: BankingDealers.co.za  This test is not yet approved or  cleared by the [place] FDA and has been authorized for detection and/or diagnosis of SARS-CoV-2 by FDA under an Emergency Use Authorization (EUA).  This EUA will remain in effect (meaning this test can be used) for the duration of the COVID-19 declaration  under Section 564(b)(1) of the Act, 21 U.S.C. section 360bbb-3(b)(1), unless the authorization is terminated or revoked sooner.  Performed at [facility], [place] [address], [place],  [address]   Surgical pcr screen     Status: None   Collection Time: 09/30/19  7:11 PM   Specimen: Nasal Mucosa; Nasal Swab  Result Value Ref Range Status   MRSA, PCR NEGATIVE NEGATIVE Final   Staphylococcus aureus NEGATIVE NEGATIVE Final    Comment: (NOTE) The Xpert SA Assay (FDA approved for NASAL specimens in patients 65 years of age and older),  is one component of a comprehensive surveillance program. It is not intended to diagnose infection nor to guide or monitor treatment. Performed at [facility], [place] [address] [place], [place] [address]       Studies: No results found.  Scheduled Meds: . allopurinol  300 mg Oral Daily  . amLODipine  10 mg Oral Daily  . aspirin EC  81 mg Oral Daily  . atorvastatin  10 mg Oral QPM  . carvedilol  3.125 mg Oral BID WC  . heparin injection (subcutaneous)  5,000 Units Subcutaneous Q8H  . insulin aspart  0-9 Units Subcutaneous TID WC  . polyethylene glycol  17 g Oral Daily  . tamsulosin  0.4 mg Oral Daily    Continuous Infusions: . ceFEPime (MAXIPIME) IV 2 g (10/04/19 2223)  . vancomycin 750 mg (10/05/19 0035)     LOS: 5 days     [name], MD [facility] Pager [phone]  If 7PM-7AM, please contact night-coverage [email or web address] Password [facility] 10/05/2019, 7:46 AM

## 2019-10-06 LAB — CBC WITH DIFFERENTIAL/PLATELET
Abs Immature Granulocytes: 0.1 10*3/uL — ABNORMAL HIGH (ref 0.00–0.07)
Basophils Absolute: 0.1 10*3/uL (ref 0.0–0.1)
Basophils Relative: 1 %
Eosinophils Absolute: 0.2 10*3/uL (ref 0.0–0.5)
Eosinophils Relative: 2 %
HCT: 36.8 % — ABNORMAL LOW (ref 39.0–52.0)
Hemoglobin: 12 g/dL — ABNORMAL LOW (ref 13.0–17.0)
Immature Granulocytes: 1 %
Lymphocytes Relative: 23 %
Lymphs Abs: 1.9 10*3/uL (ref 0.7–4.0)
MCH: 29 pg (ref 26.0–34.0)
MCHC: 32.6 g/dL (ref 30.0–36.0)
MCV: 88.9 fL (ref 80.0–100.0)
Monocytes Absolute: 0.9 10*3/uL (ref 0.1–1.0)
Monocytes Relative: 11 %
Neutro Abs: 5.2 10*3/uL (ref 1.7–7.7)
Neutrophils Relative %: 62 %
Platelets: 325 10*3/uL (ref 150–400)
RBC: 4.14 MIL/uL — ABNORMAL LOW (ref 4.22–5.81)
RDW: 14.1 % (ref 11.5–15.5)
WBC: 8.3 10*3/uL (ref 4.0–10.5)
nRBC: 0 % (ref 0.0–0.2)

## 2019-10-06 LAB — GLUCOSE, CAPILLARY
Glucose-Capillary: 108 mg/dL — ABNORMAL HIGH (ref 70–99)
Glucose-Capillary: 126 mg/dL — ABNORMAL HIGH (ref 70–99)
Glucose-Capillary: 90 mg/dL (ref 70–99)
Glucose-Capillary: 157 mg/dL — ABNORMAL HIGH (ref 70–99)

## 2019-10-06 LAB — BASIC METABOLIC PANEL
Anion gap: 8 (ref 5–15)
BUN: 20 mg/dL (ref 8–23)
CO2: 23 mmol/L (ref 22–32)
Calcium: 9.4 mg/dL (ref 8.9–10.3)
Chloride: 106 mmol/L (ref 98–111)
Creatinine, Ser: 1.2 mg/dL (ref 0.61–1.24)
GFR calc Af Amer: >60 mL/min (ref >60)
GFR calc non Af Amer: 52 mL/min — ABNORMAL LOW (ref >60)
Glucose, Bld: 119 mg/dL — ABNORMAL HIGH (ref 70–99)
Potassium: 4.2 mmol/L (ref 3.5–5.1)
Sodium: 137 mmol/L (ref 135–145)

## 2019-10-06 MED ORDER — DOXYCYCLINE HYCLATE 100 MG PO TABS
100.0000 mg | ORAL_TABLET | Freq: Two times a day (BID) | ORAL | Status: AC
  Administered 2019-10-06 – 2019-10-08 (×6): 100 mg via ORAL
  Filled 2019-10-06 (×6): qty 1

## 2019-10-06 MED ORDER — SORBITOL 70 % SOLN
960.0000 mL | TOPICAL_OIL | Freq: Once | ORAL | Status: DC
  Filled 2019-10-06: qty 473

## 2019-10-06 NOTE — Progress Notes (Signed)
Late entry: 10/02/2019 @ 1630 Admitted with L great toe osteomyelitis, s/p L great toe amputation on 10/01/19. PMHx: HTN, HLD, neuropathy, borderline DM. NCM spoke with pt @ bedside regarding d/c planning.  PT  Eval/ recommendation shared : SNF;Supervision/Assistance - 24 hour. Patient reported that he lives alone and is currently unable to care for self given his  current physical needs and fall risk. Patient expressed understanding of PT recommendation and is agreeable to SNF placement at time of discharge. Patient reports preference for [facility] in [place].  States daughter Colletta [place] [phone]) works in [place] and would be able to check on him regularly. NCM discussed insurance authorization process and provided Medicare SNF ratings list. Pt stated he would like to use his VA benefits for SNF placement. Patient expressed being hopeful for rehab and to feel better soon. No further questions reported at this time. NCM to continue to follow and assist with discharge planning needs.    7/16  NCM learned from [facility] admission [place] is not contracted with them, however, pt's  [employer] Medicare is in network. NCM shared with pt and pt gave the ok to use his [facility] instead.  Whitman Hero RN,BSN,CM [phone]

## 2019-10-06 NOTE — Progress Notes (Signed)
PROGRESS NOTE  Peter James. YFV:[id number] DOB: 08/21/1925 DOA: 09/29/2019 PCP: [name], MD  HPI/Recap of past 24 hours: Peter Jamesis a 86 y.o.malewith medical history significant fortype 2 diabetes, CKD stage III, nonobstructive CAD, PAD, hypertension, hyperlipidemia, and remote history of bladder cancer who presents to the ED for evaluation of infection of his left first toe, no reported exertional dyspnea ongoing for a few weeks. In the hospital he was diagnosed with left toe infection and admitted to the hospital.  7/189/21: Reports feeling constipated this morning and would like to have a bowel movement.  Smog enema added.  IV antibiotics vancomycin and cefepime switched to doxycycline today.  Assessment/Plan: Principal Problem:   Osteomyelitis of great toe of left foot ([name]) Active Problems:   PAD (peripheral artery disease) ([name])   Hypertension associated with diabetes ([place])   Exertional dyspnea   CAD (coronary atherosclerotic disease)   Hyperlipidemia associated with type 2 diabetes mellitus ([name])   Type 2 diabetes mellitus ([name])   CKD (chronic kidney disease), stage III  Left big toe cellulitis, ulceration with osteomyelitis Patient was started on empiric antibiotics including cefepime and IV vancomycin, completed. Orthopedics was consulted. Patient underwent amputation of the toes on 7/14. Vascular ultrasound only showed moderate arterial disease. Since he had surrounding cellulitis   Switch to p.o. doxycycline on 10/06/2019, continue PT recommending SNF TOC assisting with SNF placement. 5 inch wheels roller walker  AKI on CKD 2, improving Creatinine appears to be at baseline after iv hydration Creatinine 1.2 with GFR greater than 60 Creatinine 1.68, 3 days ago. Continue to avoid nephrotoxins and hypotension Continue to monitor urine output  Exertional dyspnea This has been ongoing for a few weeks. Etiology is unclear. BNP 57.3.  Echocardiogram does show diminished ejection fraction but patient without any signs of CHF. Chest x-ray showed mild cardiomegaly without any other acute process. Patient may benefit from being referred to cardiology in the outpatient setting. This can be done by his PCP in [place], Dr. Woody Seller.  History of coronary artery disease and peripheral artery disease Continue aspirin and statin. Low-dose beta-blocker was added. EKG does show LBBB with the patient denies any chest pain. Pursue medical management. Needs to follow-up with cardiology as outpatient  Chronic systolic CHF Ejection fraction noted to be 40-45%. Euvolemic on exam without exacerbation  Continue carvedilol  He is noted to have elevated creatinine so we will hold off on initiating ACE inhibitor or ARB.  Due to advanced age likely not a candidate for invasive cardiac testing.  Plan to follow-up with outpatient cardiology Net I&O -2.1 L  Essential hypertension Overall stable  Continue with carvedilol and amlodipine   Constipation Continue bowel regimen, MiraLAX daily Continue Senokot 2 tablets twice daily Added smog enema  History of BPH Good urine output On Flomax  Dyslipidemia Continue statin  History of gout Continue allopurinol  Diabetes mellitus type 2 with hyperglycemia HbA1c 6.9. Continue SSI. Home medication list show that he is on alogliptin which can be continued at discharge.  Mild chronic normocytic anemia Hemoglobin stable at 12.0. No evidence of overt bleeding. Continue to monitor  DVT prophylaxis:Subcutaneous heparin 3 times daily CODE STATUS:DNR Family communication: None at bedside Disposition: SNF placement pending    Dispo:             Patient From: Home             Planned Disposition: SNF pending  Expected discharge date: 10/07/2019             Medically stable for discharge: No , started on new oral antibiotics.  Objective: Vitals:   10/05/19  2000 10/06/19 0500 10/06/19 0502 10/06/19 0900  BP: 132/67 122/74 122/74 122/74  Pulse: 79  (!) 101 (!) 101  Resp: 15 17 15    Temp: 98.7 F (37.1 C) 98.7 F (37.1 C) 98.7 F (37.1 C)   TempSrc: Oral Oral Oral   SpO2: 99% 96% 98%   Weight:      Height:        Intake/Output Summary (Last 24 hours) at 10/06/2019 1417 Last data filed at 10/06/2019 0500 Gross per 24 hour  Intake --  Output 1050 ml  Net -1050 ml   Filed Weights   09/29/19 1939 10/01/19 1216  Weight: 98.4 kg 98.4 kg    Exam:  . General: 86 y.o. year-old male pleasant well-developed well-nourished in no acute distress.  Alert oriented x3.   . Cardiovascular: Regular rate and rhythm no rubs or gallops.   [name] Respiratory: Clear auscultation no wheezes or rales. . Abdomen: Soft nontender normal bowel sounds present  . musculoskeletal: No lower extremity edema bilaterally. [name] Psychiatry: Mood is appropriate for condition and setting.   Data Reviewed: CBC: Recent Labs  Lab 10/01/19 0151 10/02/19 0249 10/03/19 0342 10/04/19 0749 10/06/19 0737  WBC 6.0 6.6 8.8 9.2 8.3  NEUTROABS 3.9 5.1 6.0 6.1 5.2  HGB 12.5* 11.7* 11.9* 12.0* 12.0*  HCT 37.9* 36.2* 36.2* 36.8* 36.8*  MCV 89.8 89.6 90.0 89.1 88.9  PLT 325 324 329 336 323   Basic Metabolic Panel: Recent Labs  Lab 10/01/19 0151 10/02/19 0249 10/03/19 0342 10/04/19 0749 10/06/19 0737  NA 133* 136 137 139 137  K 4.4 5.2* 4.5 4.3 4.2  CL 101 105 106 107 106  CO2 23 22 21* 24 23  GLUCOSE 168* 144* 93 106* 119*  BUN 20 25* 25* 20 20  CREATININE 1.34* 1.68* 1.35* 1.35* 1.20  CALCIUM 9.0 9.2 9.1 9.4 9.4  MG 2.0 1.9 1.9 1.9  --    GFR: Estimated Creatinine Clearance: 48.5 mL/min (by C-G formula based on SCr of 1.2 mg/dL). Liver Function Tests: Recent Labs  Lab 09/29/19 1448 10/01/19 0151 10/02/19 0249 10/03/19 0342  AST 21 17 23 28   ALT 20 16 22 23   ALKPHOS 79 68 66 58  BILITOT 0.3 0.6 0.3 0.2*  PROT 7.7 7.1 7.1 6.9  ALBUMIN 3.5 3.1* 2.9* 3.1*    No results for input(s): LIPASE, AMYLASE in the last 168 hours. No results for input(s): AMMONIA in the last 168 hours. Coagulation Profile: Recent Labs  Lab 09/30/19 1412  INR 1.0   Cardiac Enzymes: No results for input(s): CKTOTAL, CKMB, CKMBINDEX, TROPONINI in the last 168 hours. BNP (last 3 results) No results for input(s): PROBNP in the last 8760 hours. HbA1C: No results for input(s): HGBA1C in the last 72 hours. CBG: Recent Labs  Lab 10/05/19 1216 10/05/19 1642 10/05/19 2114 10/06/19 0701 10/06/19 1305  GLUCAP 151* 130* 127* 108* 126*   Lipid Profile: No results for input(s): CHOL, HDL, LDLCALC, TRIG, CHOLHDL, LDLDIRECT in the last 72 hours. Thyroid Function Tests: No results for input(s): TSH, T4TOTAL, FREET4, T3FREE, THYROIDAB in the last 72 hours. Anemia Panel: No results for input(s): VITAMINB12, FOLATE, FERRITIN, TIBC, IRON, RETICCTPCT in the last 72 hours. Urine analysis: No results found for: COLORURINE, APPEARANCEUR, [place], [place], [place], [place], [place], [place], [place], [place], NITRITE, LEUKOCYTESUR  Sepsis Labs: @LABRCNTIP (procalcitonin:4,lacticidven:4)  ) Recent Results (from the past 240 hour(s))  Blood culture (routine x 2)     Status: None   Collection Time: 09/30/19  1:21 AM   Specimen: BLOOD RIGHT HAND  Result Value Ref Range Status   Specimen Description BLOOD RIGHT HAND  Final   Special Requests   Final    BOTTLES DRAWN AEROBIC AND ANAEROBIC Blood Culture adequate volume   Culture   Final    NO GROWTH 5 DAYS Performed at [facility], [place] [address] [place], [place] [address]    Report Status 10/05/2019 FINAL  Final  Blood culture (routine x 2)     Status: None   Collection Time: 09/30/19  1:24 AM   Specimen: BLOOD  Result Value Ref Range Status   Specimen Description BLOOD LEFT ANTECUBITAL  Final   Special Requests   Final    BOTTLES DRAWN AEROBIC AND ANAEROBIC Blood Culture adequate volume   Culture    Final    NO GROWTH 5 DAYS Performed at [facility], [place] [address] [place], [place] [address]    Report Status 10/05/2019 FINAL  Final  SARS Coronavirus 2 by RT PCR (hospital order, performed in [facility] hospital lab) Nasopharyngeal Nasopharyngeal Swab     Status: None   Collection Time: 09/30/19  2:23 AM   Specimen: Nasopharyngeal Swab  Result Value Ref Range Status   SARS Coronavirus 2 NEGATIVE NEGATIVE Final    Comment: (NOTE) SARS-CoV-2 target nucleic acids are NOT DETECTED.  The SARS-CoV-2 RNA is generally detectable in upper and lower respiratory specimens during the acute phase of infection. The lowest concentration of SARS-CoV-2 viral copies this assay can detect is 250 copies / mL. A negative result does not preclude SARS-CoV-2 infection and should not be used as the sole basis for treatment or other patient management decisions.  A negative result may occur with improper specimen collection / handling, submission of specimen other than nasopharyngeal swab, presence of viral mutation(s) within the areas targeted by this assay, and inadequate number of viral copies (<250 copies / mL). A negative result must be combined with clinical observations, patient history, and epidemiological information.  Fact Sheet for Patients:   StrictlyIdeas.no  Fact Sheet for Healthcare Providers: BankingDealers.co.za  This test is not yet approved or  cleared by the [place] FDA and has been authorized for detection and/or diagnosis of SARS-CoV-2 by FDA under an Emergency Use Authorization (EUA).  This EUA will remain in effect (meaning this test can be used) for the duration of the COVID-19 declaration under Section 564(b)(1) of the Act, 21 U.S.C. section 360bbb-3(b)(1), unless the authorization is terminated or revoked sooner.  Performed at [facility], [place] [address] [place], [place] [address]   Surgical pcr screen      Status: None   Collection Time: 09/30/19  7:11 PM   Specimen: Nasal Mucosa; Nasal Swab  Result Value Ref Range Status   MRSA, PCR NEGATIVE NEGATIVE Final   Staphylococcus aureus NEGATIVE NEGATIVE Final    Comment: (NOTE) The Xpert SA Assay (FDA approved for NASAL specimens in patients 60 years of age and older), is one component of a comprehensive surveillance program. It is not intended to diagnose infection nor to guide or monitor treatment. Performed at [facility], [place] [address], [place], [place] [address]       Studies: No results found.  Scheduled Meds: . allopurinol  300 mg Oral Daily  .  amLODipine  10 mg Oral Daily  . aspirin EC  81 mg Oral Daily  . atorvastatin  10 mg Oral QPM  . carvedilol  3.125 mg Oral BID WC  . doxycycline  100 mg Oral Q12H  . heparin injection (subcutaneous)  5,000 Units Subcutaneous Q8H  . insulin aspart  0-9 Units Subcutaneous TID WC  . polyethylene glycol  17 g Oral Daily  . senna-docusate  2 tablet Oral BID  . sorbitol, milk of mag, mineral oil, glycerin (SMOG) enema  960 mL Rectal Once  . tamsulosin  0.4 mg Oral Daily    Continuous Infusions:    LOS: 6 days     [name], MD [facility] Pager [phone]  If 7PM-7AM, please contact night-coverage [email or web address] Password [facility] 10/06/2019, 2:17 PM

## 2019-10-06 NOTE — Plan of Care (Signed)
  Problem: Education: Goal: Knowledge of General Education information will improve Description Including pain rating scale, medication(s)/side effects and non-pharmacologic comfort measures Outcome: Progressing   Problem: Health Behavior/Discharge Planning: Goal: Ability to manage health-related needs will improve Outcome: Progressing   Problem: Nutrition: Goal: Adequate nutrition will be maintained Outcome: Progressing   Problem: Coping: Goal: Level of anxiety will decrease Outcome: Progressing   Problem: Elimination: Goal: Will not experience complications related to bowel motility Outcome: Progressing   Problem: Safety: Goal: Ability to remain free from injury will improve Outcome: Progressing   Problem: Skin Integrity: Goal: Risk for impaired skin integrity will decrease Outcome: Progressing   

## 2019-10-06 NOTE — Plan of Care (Signed)
  Problem: Coping: Goal: Level of anxiety will decrease Outcome: Progressing   Problem: Pain Managment: Goal: General experience of comfort will improve Outcome: Progressing   Problem: Safety: Goal: Ability to remain free from injury will improve Outcome: Progressing   

## 2019-10-06 NOTE — Progress Notes (Signed)
Physical Therapy Treatment Patient Details Name: Peter James. MRN: [id number] DOB: 1925-08-31 Today's Date: 10/06/2019    History of Present Illness Pt is a 86 yo male admitted 09/29/19 with L great toe osteomyelitis requiring L great toe amputation on 10/01/19. PMHx: HTN, HLD, neuropathy, borderline DM.    PT Comments    Pt seated in recliner.  He had more difficulty with transfers and maintaining weight bearing this session.  He continues to fatigue with activity.  Continue to recommend snf at this time.     Follow Up Recommendations  SNF;Supervision/Assistance - 24 hour     Equipment Recommendations  Rolling walker with 5" wheels    Recommendations for Other Services       Precautions / Restrictions Precautions Precautions: Fall Required Braces or Orthoses: Other Brace Other Brace: post op shoe on L Restrictions Weight Bearing Restrictions: Yes LLE Weight Bearing: Non weight bearing Other Position/Activity Restrictions: Pt wear post op on R foot for support when hopping he would benefit from a sneaker on the R foot to improve support, stability and balance.    Mobility  Bed Mobility Overal bed mobility: Modified Independent Bed Mobility: Supine to Sit;Sit to Supine     Supine to sit: Supervision Sit to supine: Supervision   General bed mobility comments: pt received in chair  Transfers Overall transfer level: Needs assistance Equipment used: Rolling walker (2 wheeled) Transfers: Sit to/from Stand Sit to Stand: Min assist         General transfer comment: Min A from recliner with use of arm rests. Pt required assistance for eccentric loading.  Pt with poor ability to manage weight bearing this session.  Ambulation/Gait Ambulation/Gait assistance: Min assist Gait Distance (Feet): 12 Feet (w/ close chair follow.) Assistive device: Rolling walker (2 wheeled) Gait Pattern/deviations: Step-to pattern     General Gait Details: Cues for sequencing and hop  to pattern.  Cues for RW proximity and upper trunk control.  Several standing rest breaks with poor foor height during hop.   Stairs             Wheelchair Mobility    Modified Rankin (Stroke Patients Only)       Balance Overall balance assessment: Needs assistance Sitting-balance support: Single extremity supported;Feet supported Sitting balance-Leahy Scale: Good       Standing balance-Leahy Scale: Poor Standing balance comment: reliant on UE support to safely stand without wt on LLE                            Cognition Arousal/Alertness: Awake/alert Behavior During Therapy: WFL for tasks assessed/performed Overall Cognitive Status: Within Functional Limits for tasks assessed                                        Exercises General Exercises - Lower Extremity Ankle Circles/Pumps: AROM;Both;20 reps;Supine Quad Sets: AROM;Both;10 reps;Supine Heel Slides: AROM;Both;10 reps;Supine Hip ABduction/ADduction: AROM;Both;10 reps;Supine Straight Leg Raises: AROM;Both;10 reps;Supine    General Comments        Pertinent Vitals/Pain Pain Assessment: No/denies pain    Home Living                      Prior Function            PT Goals (current goals can now be found in the care plan section) Acute  Rehab PT Goals Patient Stated Goal: agreeable to rehab prior to return home Potential to Achieve Goals: Good Progress towards PT goals: Progressing toward goals    Frequency    Min 3X/week      PT Plan Current plan remains appropriate    Co-evaluation              AM-PAC PT "6 Clicks" Mobility   Outcome Measure  Help needed turning from your back to your side while in a flat bed without using bedrails?: None Help needed moving from lying on your back to sitting on the side of a flat bed without using bedrails?: A Little Help needed moving to and from a bed to a chair (including a wheelchair)?: A Little Help needed  standing up from a chair using your arms (e.g., wheelchair or bedside chair)?: A Little Help needed to walk in hospital room?: A Little Help needed climbing 3-5 steps with a railing? : Total 6 Click Score: 17    End of Session Equipment Utilized During Treatment: Gait belt Activity Tolerance: Patient tolerated treatment well Patient left: in chair;with call bell/phone within reach;with chair alarm set Nurse Communication: Mobility status PT Visit Diagnosis: Unsteadiness on feet (R26.81);Difficulty in walking, not elsewhere classified (R26.2)     Time: 0086-7619 PT Time Calculation (min) (ACUTE ONLY): 17 min  Charges:  $Gait Training: 8-22 mins                     Erasmo Leventhal , PTA Acute Rehabilitation Services Pager [phone] Office [phone]     Ysela Hettinger Eli Hose 10/06/2019, 6:41 PM

## 2019-10-06 NOTE — Plan of Care (Signed)

## 2019-10-07 LAB — GLUCOSE, CAPILLARY
Glucose-Capillary: 100 mg/dL — ABNORMAL HIGH (ref 70–99)
Glucose-Capillary: 145 mg/dL — ABNORMAL HIGH (ref 70–99)
Glucose-Capillary: 109 mg/dL — ABNORMAL HIGH (ref 70–99)
Glucose-Capillary: 99 mg/dL (ref 70–99)

## 2019-10-07 MED ORDER — SACCHAROMYCES BOULARDII 250 MG PO CAPS
250.0000 mg | ORAL_CAPSULE | Freq: Two times a day (BID) | ORAL | Status: DC
  Administered 2019-10-07 – 2019-10-09 (×5): 250 mg via ORAL
  Filled 2019-10-07 (×5): qty 1

## 2019-10-07 NOTE — Progress Notes (Signed)
Occupational Therapy Treatment Patient Details Name: Peter James. MRN: [id number] DOB: 01-Mar-1926 Today's Date: 10/07/2019    History of present illness Pt is a 86 yo male admitted 09/29/19 with L great toe osteomyelitis requiring L great toe amputation on 10/01/19. PMHx: HTN, HLD, neuropathy, borderline DM.   OT comments  Pt making good progress towards OT goals this session. Session focus on functional mobility as precursor to higher level BADLs and LB ADLs. Overall, pt requires MIN A for functional mobility with RW and chair follow for safety. Pt able to complete LB dressing from recliner with supervision- set- up assist. Pt with good carryover of WB'ing restrictions during mobility. DC plan remains appropriate, will follow acutely per POC.    Follow Up Recommendations  SNF;Supervision/Assistance - 24 hour    Equipment Recommendations  Other (comment) (defer to next venue of care)    Recommendations for Other Services      Precautions / Restrictions Precautions Precautions: Fall Required Braces or Orthoses: Other Brace Other Brace: post op shoe on L Restrictions Weight Bearing Restrictions: Yes LLE Weight Bearing: Non weight bearing Other Position/Activity Restrictions: pts son brought sneaker today (7/20) for R foot. shoe on R greatly assists with support, stability and balance.       Mobility Bed Mobility               General bed mobility comments: pt received in chair  Transfers Overall transfer level: Needs assistance Equipment used: Rolling walker (2 wheeled) Transfers: Sit to/from Stand Sit to Stand: Min guard;Min assist         General transfer comment: pt able to sit<>stand from chair with min guard assist needing cues for hand placement. initial MIN A to obtain standing balance    Balance Overall balance assessment: Needs assistance Sitting-balance support: Single extremity supported;Feet supported Sitting balance-Leahy Scale: Good      Standing balance support: Bilateral upper extremity supported Standing balance-Leahy Scale: Poor Standing balance comment: reliant on UE support to safely stand without wt on LLE                           ADL either performed or assessed with clinical judgement   ADL Overall ADL's : Needs assistance/impaired             Lower Body Bathing: Supervison/ safety;Bed level Lower Body Bathing Details (indicate cue type and reason): simualted via LB dressing. pt abel to reach to LB from long sitting in recliner to adjust socks from long sitting     Lower Body Dressing: Supervision/safety;Set up;Sitting/lateral leans Lower Body Dressing Details (indicate cue type and reason): pt able to don shoe on R foot via figure four method from recliner with sueprvision- set- up assist Toilet Transfer: Minimal assistance;Ambulation;RW Toilet Transfer Details (indicate cue type and reason): simulated via functional mobility with MIN A and RW. pt demo'ed good awareness to maintain WB restrictions       Tub/Shower Transfer Details (indicate cue type and reason): pt reports walkin shower at home. pt reports children plan to put in grab bars and a seat Functional mobility during ADLs: Minimal assistance;Cueing for sequencing;Rolling walker       Vision       Perception     Praxis      Cognition Arousal/Alertness: Awake/alert Behavior During Therapy: WFL for tasks assessed/performed Overall Cognitive Status: Within Functional Limits for tasks assessed  General Comments: very friendly and attentive to session        Exercises     Shoulder Instructions       General Comments VSS, pts son present during session    Pertinent Vitals/ Pain       Pain Assessment: No/denies pain  Home Living                                          Prior Functioning/Environment              Frequency  Min 2X/week         Progress Toward Goals  OT Goals(current goals can now be found in the care plan section)  Progress towards OT goals: Progressing toward goals  Acute Rehab OT Goals Patient Stated Goal: agreeable to rehab prior to return home OT Goal Formulation: With patient Time For Goal Achievement: 10/15/19 Potential to Achieve Goals: Good  Plan Discharge plan remains appropriate;Frequency remains appropriate    Co-evaluation                 AM-PAC OT "6 Clicks" Daily Activity     Outcome Measure   Help from another person eating meals?: None Help from another person taking care of personal grooming?: A Little Help from another person toileting, which includes using toliet, bedpan, or urinal?: A Little Help from another person bathing (including washing, rinsing, drying)?: A Little Help from another person to put on and taking off regular upper body clothing?: None Help from another person to put on and taking off regular lower body clothing?: None 6 Click Score: 21    End of Session Equipment Utilized During Treatment: Gait belt;Rolling walker;Other (comment) (post op shoe)  OT Visit Diagnosis: Unsteadiness on feet (R26.81);Other abnormalities of gait and mobility (R26.89)   Activity Tolerance Patient tolerated treatment well   Patient Left in chair;with call bell/phone within reach   Nurse Communication Mobility status        Time: 2993-7169 OT Time Calculation (min): 15 min  Charges: OT General Charges $OT Visit: 1 Visit OT Treatments $Self Care/Home Management : 8-22 mins  Peter James., COTA/L Acute Rehabilitation Services [phone]   Peter James 10/07/2019, 4:29 PM

## 2019-10-07 NOTE — Plan of Care (Signed)

## 2019-10-07 NOTE — Plan of Care (Signed)
  Problem: Education: Goal: Knowledge of General Education information will improve Description: Including pain rating scale, medication(s)/side effects and non-pharmacologic comfort measures Outcome: Progressing   Problem: Education: Goal: Knowledge of General Education information will improve Description: Including pain rating scale, medication(s)/side effects and non-pharmacologic comfort measures Outcome: Progressing   Problem: Education: Goal: Knowledge of General Education information will improve Description: Including pain rating scale, medication(s)/side effects and non-pharmacologic comfort measures Outcome: Progressing   Problem: Education: Goal: Knowledge of General Education information will improve Description: Including pain rating scale, medication(s)/side effects and non-pharmacologic comfort measures Outcome: Progressing   Problem: Education: Goal: Knowledge of General Education information will improve Description: Including pain rating scale, medication(s)/side effects and non-pharmacologic comfort measures Outcome: Progressing   

## 2019-10-07 NOTE — Progress Notes (Signed)
PROGRESS NOTE  Peter James. LOV:[id number] DOB: November 25, 1925 DOA: 09/29/2019 PCP: [name], MD  HPI/Recap of past 24 hours: Peter Jamesis a 86 y.o.malewith medical history significant fortype 2 diabetes, CKD stage III, nonobstructive CAD, PAD, hypertension, hyperlipidemia, and remote history of bladder cancer who presents to the ED for evaluation of infection of his left first toe, no reported exertional dyspnea ongoing for a few weeks. In the hospital he was diagnosed with left toe infection and admitted to the hospital.  10/07/19: Feels ok this am.  Awaiting SNF placement.  Assessment/Plan: Principal Problem:   Osteomyelitis of great toe of left foot ([name]) Active Problems:   PAD (peripheral artery disease) ([name])   Hypertension associated with diabetes ([place])   Exertional dyspnea   CAD (coronary atherosclerotic disease)   Hyperlipidemia associated with type 2 diabetes mellitus ([name])   Type 2 diabetes mellitus ([name])   CKD (chronic kidney disease), stage III  Left big toe cellulitis, ulceration with osteomyelitis Patient was started on empiric antibiotics including cefepime and IV vancomycin, completed course.  Underwent amputation of the left toes on 7/14 by Dr. Sharol Given. Vascular ultrasound showed moderate arterial disease. Since he had surrounding cellulitis will continue antibiotics.  Switched to p.o. doxycycline on 10/06/2019 PT recommending SNF TOC assisting with SNF placement. DME ordered 5 inch wheels roller walker  AKI on CKD 2, improving Creatinine appears to be at baseline after iv fluid hydration Creatinine 1.2 with GFR greater than 60 Creatinine 1.68, 3 days ago. Continue to avoid nephrotoxins and hypotension  1.0 L urine output recorded in the last 24 hours.  Exertional dyspnea This has been ongoing for a few weeks. Etiology is unclear. BNP 57.3. Echocardiogram does show diminished ejection fraction but patient without any signs of CHF,  euvolemic on exam. Chest x-ray showed mild cardiomegaly without any other acute process. Patient may benefit from being referred to cardiology in the outpatient setting. This can be done by his PCP in [place], Dr. Woody Seller.  History of coronary artery disease and peripheral artery disease Continue aspirin and statin. Low-dose beta-blocker was added. EKG does show LBBB with the patient denies any chest pain. Pursue medical management. Needs to follow-up with cardiology as outpatient  Chronic systolic CHF Ejection fraction noted to be 40-45%. Euvolemic on exam without exacerbation  Continue carvedilol  He is noted to have elevated creatinine so we will hold off on initiating ACE inhibitor or ARB.  Plan to follow-up with outpatient cardiology Net I&O -3.1 L  Essential hypertension BP stable Continue with carvedilol and amlodipine   Constipation Continue bowel regimen, MiraLAX daily Continue Senokot 2 tablets twice daily Added smog enema  History of BPH Good urine output  Flomax  Dyslipidemia Continue statin  History of gout Continue allopurinol  Diabetes mellitus type 2 with hyperglycemia HbA1c 6.9. Continue SSI. Home medication list show that he is on alogliptin which can be continued at discharge.  Mild chronic normocytic anemia Hemoglobin stable at 12.0. No evidence of overt bleeding. Labs every other day.  DVT prophylaxis:Subcutaneous heparin 3 times daily CODE STATUS:DNR Family communication: None at bedside Disposition: SNF placement pending    Dispo:             Patient From: Home             Planned Disposition: SNF pending             Expected discharge date: 10/07/2019  Medically stable for discharge: Pending SNF placement.  Objective: Vitals:   10/06/19 1529 10/06/19 2002 10/07/19 0355 10/07/19 0843  BP: (!) 141/72 134/75 125/70 125/77  Pulse: 77 76 74 77  Resp: 17 17 15 17   Temp: 98.5 F (36.9 C) 98.3 F (36.8 C) 98.4  F (36.9 C) 98.2 F (36.8 C)  TempSrc: Oral Oral Oral Oral  SpO2: 99% 100% 100% 97%  Weight:      Height:        Intake/Output Summary (Last 24 hours) at 10/07/2019 1245 Last data filed at 10/07/2019 0500 Gross per 24 hour  Intake --  Output 1000 ml  Net -1000 ml   Filed Weights   09/29/19 1939 10/01/19 1216  Weight: 98.4 kg 98.4 kg    Exam: No significant changes from previous exam.  . General: 86 y.o. year-old male pleasant well-developed well-nourished in no acute distress.  Alert oriented x3.   . Cardiovascular: Regular rate and rhythm no rubs or gallops.   [name] Respiratory: Clear auscultation no wheezes or rales. . Abdomen: Soft nontender normal bowel sounds present  . musculoskeletal: No lower extremity edema bilaterally. [name] Psychiatry: Mood is appropriate for condition and setting.   Data Reviewed: CBC: Recent Labs  Lab 10/01/19 0151 10/02/19 0249 10/03/19 0342 10/04/19 0749 10/06/19 0737  WBC 6.0 6.6 8.8 9.2 8.3  NEUTROABS 3.9 5.1 6.0 6.1 5.2  HGB 12.5* 11.7* 11.9* 12.0* 12.0*  HCT 37.9* 36.2* 36.2* 36.8* 36.8*  MCV 89.8 89.6 90.0 89.1 88.9  PLT 325 324 329 336 496   Basic Metabolic Panel: Recent Labs  Lab 10/01/19 0151 10/02/19 0249 10/03/19 0342 10/04/19 0749 10/06/19 0737  NA 133* 136 137 139 137  K 4.4 5.2* 4.5 4.3 4.2  CL 101 105 106 107 106  CO2 23 22 21* 24 23  GLUCOSE 168* 144* 93 106* 119*  BUN 20 25* 25* 20 20  CREATININE 1.34* 1.68* 1.35* 1.35* 1.20  CALCIUM 9.0 9.2 9.1 9.4 9.4  MG 2.0 1.9 1.9 1.9  --    GFR: Estimated Creatinine Clearance: 48.5 mL/min (by C-G formula based on SCr of 1.2 mg/dL). Liver Function Tests: Recent Labs  Lab 10/01/19 0151 10/02/19 0249 10/03/19 0342  AST 17 23 28   ALT 16 22 23   ALKPHOS 68 66 58  BILITOT 0.6 0.3 0.2*  PROT 7.1 7.1 6.9  ALBUMIN 3.1* 2.9* 3.1*   No results for input(s): LIPASE, AMYLASE in the last 168 hours. No results for input(s): AMMONIA in the last 168 hours. Coagulation  Profile: Recent Labs  Lab 09/30/19 1412  INR 1.0   Cardiac Enzymes: No results for input(s): CKTOTAL, CKMB, CKMBINDEX, TROPONINI in the last 168 hours. BNP (last 3 results) No results for input(s): PROBNP in the last 8760 hours. HbA1C: No results for input(s): HGBA1C in the last 72 hours. CBG: Recent Labs  Lab 10/06/19 1305 10/06/19 1720 10/06/19 2038 10/07/19 0652 10/07/19 1137  GLUCAP 126* 90 157* 100* 145*   Lipid Profile: No results for input(s): CHOL, HDL, LDLCALC, TRIG, CHOLHDL, LDLDIRECT in the last 72 hours. Thyroid Function Tests: No results for input(s): TSH, T4TOTAL, FREET4, T3FREE, THYROIDAB in the last 72 hours. Anemia Panel: No results for input(s): VITAMINB12, FOLATE, FERRITIN, TIBC, IRON, RETICCTPCT in the last 72 hours. Urine analysis: No results found for: COLORURINE, APPEARANCEUR, LABSPEC, PHURINE, GLUCOSEU, HGBUR, BILIRUBINUR, KETONESUR, PROTEINUR, UROBILINOGEN, NITRITE, LEUKOCYTESUR Sepsis Labs: @LABRCNTIP (procalcitonin:4,lacticidven:4)  ) Recent Results (from the past 240 hour(s))  Blood culture (routine x 2)  Status: None   Collection Time: 09/30/19  1:21 AM   Specimen: BLOOD RIGHT HAND  Result Value Ref Range Status   Specimen Description BLOOD RIGHT HAND  Final   Special Requests   Final    BOTTLES DRAWN AEROBIC AND ANAEROBIC Blood Culture adequate volume   Culture   Final    NO GROWTH 5 DAYS Performed at [facility], [address], [place], [place] [address]    Report Status 10/05/2019 FINAL  Final  Blood culture (routine x 2)     Status: None   Collection Time: 09/30/19  1:24 AM   Specimen: BLOOD  Result Value Ref Range Status   Specimen Description BLOOD LEFT ANTECUBITAL  Final   Special Requests   Final    BOTTLES DRAWN AEROBIC AND ANAEROBIC Blood Culture adequate volume   Culture   Final    NO GROWTH 5 DAYS Performed at [facility], [place] [address] [place], [place] [address]    Report Status 10/05/2019 FINAL   Final  SARS Coronavirus 2 by RT PCR (hospital order, performed in [facility] hospital lab) Nasopharyngeal Nasopharyngeal Swab     Status: None   Collection Time: 09/30/19  2:23 AM   Specimen: Nasopharyngeal Swab  Result Value Ref Range Status   SARS Coronavirus 2 NEGATIVE NEGATIVE Final    Comment: (NOTE) SARS-CoV-2 target nucleic acids are NOT DETECTED.  The SARS-CoV-2 RNA is generally detectable in upper and lower respiratory specimens during the acute phase of infection. The lowest concentration of SARS-CoV-2 viral copies this assay can detect is 250 copies / mL. A negative result does not preclude SARS-CoV-2 infection and should not be used as the sole basis for treatment or other patient management decisions.  A negative result may occur with improper specimen collection / handling, submission of specimen other than nasopharyngeal swab, presence of viral mutation(s) within the areas targeted by this assay, and inadequate number of viral copies (<250 copies / mL). A negative result must be combined with clinical observations, patient history, and epidemiological information.  Fact Sheet for Patients:   StrictlyIdeas.no  Fact Sheet for Healthcare Providers: BankingDealers.co.za  This test is not yet approved or  cleared by the [place] FDA and has been authorized for detection and/or diagnosis of SARS-CoV-2 by FDA under an Emergency Use Authorization (EUA).  This EUA will remain in effect (meaning this test can be used) for the duration of the COVID-19 declaration under Section 564(b)(1) of the Act, 21 U.S.C. section 360bbb-3(b)(1), unless the authorization is terminated or revoked sooner.  Performed at [facility], [place] [address] [place], [place] [address]   Surgical pcr screen     Status: None   Collection Time: 09/30/19  7:11 PM   Specimen: Nasal Mucosa; Nasal Swab  Result Value Ref Range Status   MRSA, PCR  NEGATIVE NEGATIVE Final   Staphylococcus aureus NEGATIVE NEGATIVE Final    Comment: (NOTE) The Xpert SA Assay (FDA approved for NASAL specimens in patients 38 years of age and older), is one component of a comprehensive surveillance program. It is not intended to diagnose infection nor to guide or monitor treatment. Performed at [facility], [place] [address], [place], [place] [address]       Studies: No results found.  Scheduled Meds: . allopurinol  300 mg Oral Daily  . amLODipine  10 mg Oral Daily  . aspirin EC  81 mg Oral Daily  . atorvastatin  10  mg Oral QPM  . carvedilol  3.125 mg Oral BID WC  . doxycycline  100 mg Oral Q12H  . heparin injection (subcutaneous)  5,000 Units Subcutaneous Q8H  . insulin aspart  0-9 Units Subcutaneous TID WC  . polyethylene glycol  17 g Oral Daily  . saccharomyces boulardii  250 mg Oral BID  . senna-docusate  2 tablet Oral BID  . sorbitol, milk of mag, mineral oil, glycerin (SMOG) enema  960 mL Rectal Once  . tamsulosin  0.4 mg Oral Daily    Continuous Infusions:    LOS: 7 days     [name], MD [facility] Pager [phone]  If 7PM-7AM, please contact night-coverage [email or web address] Password [facility] 10/07/2019, 12:45 PM

## 2019-10-07 NOTE — TOC Progression Note (Signed)
Transition of Care (TOC) - Progression Note    Patient Details  Name: Peter James. MRN: [id number] Date of Birth: [birth year]  Transition of Care [facility]) CM/SW Contact  Sharin Mons, RN Phone Number: 10/07/2019, 1:50 PM  Clinical Narrative:     NCM received call from [facility] admissions. Liaison stated Insurance authorization still pending. States  calls have been made 3x /day to check on status. TOC team will continue to monitor and follow....  Expected Discharge Plan: [place] ([facility]) Barriers to Discharge: Insurance Authorization  Expected Discharge Plan and Services Expected Discharge Plan: [facility] ([facility])                                               Social Determinants of Health (SDOH) Interventions    Readmission Risk Interventions No flowsheet data found.

## 2019-10-08 LAB — GLUCOSE, CAPILLARY
Glucose-Capillary: 103 mg/dL — ABNORMAL HIGH (ref 70–99)
Glucose-Capillary: 157 mg/dL — ABNORMAL HIGH (ref 70–99)
Glucose-Capillary: 100 mg/dL — ABNORMAL HIGH (ref 70–99)
Glucose-Capillary: 115 mg/dL — ABNORMAL HIGH (ref 70–99)

## 2019-10-08 LAB — SARS CORONAVIRUS 2 BY RT PCR (HOSPITAL ORDER, PERFORMED IN ~~LOC~~ HOSPITAL LAB): SARS Coronavirus 2: NEGATIVE

## 2019-10-08 NOTE — Progress Notes (Signed)
PROGRESS NOTE  Peter James. WER:[id number] DOB: 06-Sep-1925 DOA: 09/29/2019 PCP: [name], MD  HPI/Recap of past 24 hours: Peter Jamesis a 86 y.o.malewith medical history significant fortype 2 diabetes, CKD stage III, nonobstructive CAD, PAD, hypertension, hyperlipidemia, and remote history of bladder cancer who presents to the ED for evaluation of infection of his left first toe, no reported exertional dyspnea ongoing for a few weeks. In the hospital he was diagnosed with left toe infection and admitted to the hospital.  10/08/19: Feels ok this am.  Awaiting insurance authorization for SNF placement.  Assessment/Plan: Principal Problem:   Osteomyelitis of great toe of left foot ([name]) Active Problems:   PAD (peripheral artery disease) ([name])   Hypertension associated with diabetes ([place])   Exertional dyspnea   CAD (coronary atherosclerotic disease)   Hyperlipidemia associated with type 2 diabetes mellitus ([name])   Type 2 diabetes mellitus ([name])   CKD (chronic kidney disease), stage III  Left big toe cellulitis, ulceration with osteomyelitis Patient was started on empiric antibiotics including cefepime and IV vancomycin, completed course.  Underwent amputation of the left toes on 7/14 by Dr. Sharol Given. Vascular ultrasound showed moderate arterial disease. Since he had surrounding cellulitis will continue antibiotics.  Switched to p.o. doxycycline on 10/06/2019 PT recommending SNF TOC assisting with SNF placement. DME ordered 5 inch wheels roller walker  AKI on CKD 2, improving Creatinine appears to be at baseline after iv fluid hydration Creatinine 1.2 with GFR greater than 60 Creatinine 1.68, 3 days ago. Continue to avoid nephrotoxins and hypotension  1.0 L urine output recorded in the last 24 hours.  Exertional dyspnea This has been ongoing for a few weeks. Etiology is unclear. BNP 57.3. Echocardiogram does show diminished ejection fraction but patient  without any signs of CHF, euvolemic on exam. Chest x-ray showed mild cardiomegaly without any other acute process. Patient may benefit from being referred to cardiology in the outpatient setting. This can be done by his PCP in [place], Dr. Woody Seller.  History of coronary artery disease and peripheral artery disease Continue aspirin and statin. Low-dose beta-blocker was added. EKG does show LBBB with the patient denies any chest pain. Pursue medical management. Needs to follow-up with cardiology as outpatient  Chronic systolic CHF Ejection fraction noted to be 40-45%. Euvolemic on exam without exacerbation  Continue carvedilol  He is noted to have elevated creatinine so we will hold off on initiating ACE inhibitor or ARB.  Plan to follow-up with outpatient cardiology Net I&O -3.1 L  Essential hypertension BP stable Continue with carvedilol and amlodipine   Constipation Continue bowel regimen, MiraLAX daily Continue Senokot 2 tablets twice daily Added smog enema  History of BPH Good urine output  Flomax  Dyslipidemia Continue statin  History of gout Continue allopurinol  Diabetes mellitus type 2 with hyperglycemia HbA1c 6.9. Continue SSI. Home medication list show that he is on alogliptin which can be continued at discharge.  Mild chronic normocytic anemia Hemoglobin stable at 12.0. No evidence of overt bleeding. Labs every other day.  DVT prophylaxis:Subcutaneous heparin 3 times daily CODE STATUS:DNR Family communication: None at bedside Disposition: SNF placement pending    Dispo:             Patient From: Home             Planned Disposition: SNF pending             Expected discharge date: 10/08/2019  Medically stable for discharge: Pending insurance authorization and SNF placement.  Objective: Vitals:   10/07/19 1815 10/07/19 2048 10/08/19 0335 10/08/19 0731  BP: 139/61 132/67 135/66 127/75  Pulse: 80 77 73 79  Resp: 17 20 18  17   Temp: (!) 97.5 F (36.4 C) 98 F (36.7 C) 98.7 F (37.1 C) 98.4 F (36.9 C)  TempSrc: Oral Oral Oral Oral  SpO2: 98% 98% 97% 96%  Weight:      Height:        Intake/Output Summary (Last 24 hours) at 10/08/2019 1314 Last data filed at 10/08/2019 1000 Gross per 24 hour  Intake 240 ml  Output 550 ml  Net -310 ml   Filed Weights   09/29/19 1939 10/01/19 1216  Weight: 98.4 kg 98.4 kg    Exam: No significant changes from previous exam.  . General: 86 y.o. year-old male pleasant well-developed well-nourished in no acute distress.  Alert oriented x3.   . Cardiovascular: Regular rate and rhythm no rubs or gallops.   [name] Respiratory: Clear auscultation no wheezes or rales. . Abdomen: Soft nontender normal bowel sounds present  . musculoskeletal: No lower extremity edema bilaterally. [name] Psychiatry: Mood is appropriate for condition and setting.   Data Reviewed: CBC: Recent Labs  Lab 10/02/19 0249 10/03/19 0342 10/04/19 0749 10/06/19 0737  WBC 6.6 8.8 9.2 8.3  NEUTROABS 5.1 6.0 6.1 5.2  HGB 11.7* 11.9* 12.0* 12.0*  HCT 36.2* 36.2* 36.8* 36.8*  MCV 89.6 90.0 89.1 88.9  PLT 324 329 336 101   Basic Metabolic Panel: Recent Labs  Lab 10/02/19 0249 10/03/19 0342 10/04/19 0749 10/06/19 0737  NA 136 137 139 137  K 5.2* 4.5 4.3 4.2  CL 105 106 107 106  CO2 22 21* 24 23  GLUCOSE 144* 93 106* 119*  BUN 25* 25* 20 20  CREATININE 1.68* 1.35* 1.35* 1.20  CALCIUM 9.2 9.1 9.4 9.4  MG 1.9 1.9 1.9  --    GFR: Estimated Creatinine Clearance: 48.5 mL/min (by C-G formula based on SCr of 1.2 mg/dL). Liver Function Tests: Recent Labs  Lab 10/02/19 0249 10/03/19 0342  AST 23 28  ALT 22 23  ALKPHOS 66 58  BILITOT 0.3 0.2*  PROT 7.1 6.9  ALBUMIN 2.9* 3.1*   No results for input(s): LIPASE, AMYLASE in the last 168 hours. No results for input(s): AMMONIA in the last 168 hours. Coagulation Profile: No results for input(s): INR, PROTIME in the last 168 hours. Cardiac  Enzymes: No results for input(s): CKTOTAL, CKMB, CKMBINDEX, TROPONINI in the last 168 hours. BNP (last 3 results) No results for input(s): PROBNP in the last 8760 hours. HbA1C: No results for input(s): HGBA1C in the last 72 hours. CBG: Recent Labs  Lab 10/07/19 1137 10/07/19 2103 10/07/19 2230 10/08/19 0735 10/08/19 1215  GLUCAP 145* 109* 99 103* 157*   Lipid Profile: No results for input(s): CHOL, HDL, LDLCALC, TRIG, CHOLHDL, LDLDIRECT in the last 72 hours. Thyroid Function Tests: No results for input(s): TSH, T4TOTAL, FREET4, T3FREE, THYROIDAB in the last 72 hours. Anemia Panel: No results for input(s): VITAMINB12, FOLATE, FERRITIN, TIBC, IRON, RETICCTPCT in the last 72 hours. Urine analysis: No results found for: COLORURINE, APPEARANCEUR, LABSPEC, PHURINE, GLUCOSEU, HGBUR, BILIRUBINUR, KETONESUR, PROTEINUR, UROBILINOGEN, NITRITE, LEUKOCYTESUR Sepsis Labs: @LABRCNTIP (procalcitonin:4,lacticidven:4)  ) Recent Results (from the past 240 hour(s))  Blood culture (routine x 2)     Status: None   Collection Time: 09/30/19  1:21 AM   Specimen: BLOOD RIGHT HAND  Result Value Ref Range  Status   Specimen Description BLOOD RIGHT HAND  Final   Special Requests   Final    BOTTLES DRAWN AEROBIC AND ANAEROBIC Blood Culture adequate volume   Culture   Final    NO GROWTH 5 DAYS Performed at [facility], [address], [place], [place] [address]    Report Status 10/05/2019 FINAL  Final  Blood culture (routine x 2)     Status: None   Collection Time: 09/30/19  1:24 AM   Specimen: BLOOD  Result Value Ref Range Status   Specimen Description BLOOD LEFT ANTECUBITAL  Final   Special Requests   Final    BOTTLES DRAWN AEROBIC AND ANAEROBIC Blood Culture adequate volume   Culture   Final    NO GROWTH 5 DAYS Performed at [facility], [place] [address], [place], [place] [address]    Report Status 10/05/2019 FINAL  Final  SARS Coronavirus 2 by RT PCR (hospital order, performed in  [facility] hospital lab) Nasopharyngeal Nasopharyngeal Swab     Status: None   Collection Time: 09/30/19  2:23 AM   Specimen: Nasopharyngeal Swab  Result Value Ref Range Status   SARS Coronavirus 2 NEGATIVE NEGATIVE Final    Comment: (NOTE) SARS-CoV-2 target nucleic acids are NOT DETECTED.  The SARS-CoV-2 RNA is generally detectable in upper and lower respiratory specimens during the acute phase of infection. The lowest concentration of SARS-CoV-2 viral copies this assay can detect is 250 copies / mL. A negative result does not preclude SARS-CoV-2 infection and should not be used as the sole basis for treatment or other patient management decisions.  A negative result may occur with improper specimen collection / handling, submission of specimen other than nasopharyngeal swab, presence of viral mutation(s) within the areas targeted by this assay, and inadequate number of viral copies (<250 copies / mL). A negative result must be combined with clinical observations, patient history, and epidemiological information.  Fact Sheet for Patients:   StrictlyIdeas.no  Fact Sheet for Healthcare Providers: BankingDealers.co.za  This test is not yet approved or  cleared by the [place] FDA and has been authorized for detection and/or diagnosis of SARS-CoV-2 by FDA under an Emergency Use Authorization (EUA).  This EUA will remain in effect (meaning this test can be used) for the duration of the COVID-19 declaration under Section 564(b)(1) of the Act, 21 U.S.C. section 360bbb-3(b)(1), unless the authorization is terminated or revoked sooner.  Performed at [facility], [place] [address] [place], [place] [address]   Surgical pcr screen     Status: None   Collection Time: 09/30/19  7:11 PM   Specimen: Nasal Mucosa; Nasal Swab  Result Value Ref Range Status   MRSA, PCR NEGATIVE NEGATIVE Final   Staphylococcus aureus NEGATIVE NEGATIVE  Final    Comment: (NOTE) The Xpert SA Assay (FDA approved for NASAL specimens in patients 49 years of age and older), is one component of a comprehensive surveillance program. It is not intended to diagnose infection nor to guide or monitor treatment. Performed at [facility], [place] [address], [place], [place] [address]       Studies: No results found.  Scheduled Meds: . allopurinol  300 mg Oral Daily  . amLODipine  10 mg Oral Daily  . aspirin EC  81 mg Oral Daily  . atorvastatin  10 mg Oral QPM  . carvedilol  3.125 mg Oral BID WC  . doxycycline  100 mg Oral Q12H  .  heparin injection (subcutaneous)  5,000 Units Subcutaneous Q8H  . insulin aspart  0-9 Units Subcutaneous TID WC  . polyethylene glycol  17 g Oral Daily  . saccharomyces boulardii  250 mg Oral BID  . senna-docusate  2 tablet Oral BID  . sorbitol, milk of mag, mineral oil, glycerin (SMOG) enema  960 mL Rectal Once  . tamsulosin  0.4 mg Oral Daily    Continuous Infusions:    LOS: 8 days     [name], MD [facility] Pager [phone]  If 7PM-7AM, please contact night-coverage [email or web address] Password [email or web address] 10/08/2019, 1:14 PM

## 2019-10-08 NOTE — TOC Progression Note (Signed)
Transition of Care (TOC) - Progression Note    Patient Details  Name: Peter James. MRN: [id number] Date of Birth: 21-Jan-1926  Transition of Care [facility]) CM/SW Contact  Sharin Mons, RN Phone Number: 10/08/2019, 5:10 PM  Clinical Narrative:    NCM received call from [facility] director. Director informed NCM pt has received authorization for SNF placement and bed will be available on  tomorrow, 7/22.  TOC team will continue to monitor and follow....   Expected Discharge Plan: [place] [facility]) Barriers to Discharge: No SNF bed  Expected Discharge Plan and Services Expected Discharge Plan: [place] [facility])                                               Social Determinants of Health (SDOH) Interventions    Readmission Risk Interventions No flowsheet data found.

## 2019-10-08 NOTE — Plan of Care (Signed)

## 2019-10-09 DIAGNOSIS — M869 Osteomyelitis, unspecified: Secondary | ICD-10-CM | POA: Diagnosis not present

## 2019-10-09 DIAGNOSIS — J449 Chronic obstructive pulmonary disease, unspecified: Secondary | ICD-10-CM | POA: Diagnosis not present

## 2019-10-09 DIAGNOSIS — S98112A Complete traumatic amputation of left great toe, initial encounter: Secondary | ICD-10-CM | POA: Diagnosis not present

## 2019-10-09 DIAGNOSIS — I2510 Atherosclerotic heart disease of native coronary artery without angina pectoris: Secondary | ICD-10-CM | POA: Diagnosis not present

## 2019-10-09 DIAGNOSIS — R531 Weakness: Secondary | ICD-10-CM | POA: Diagnosis not present

## 2019-10-09 DIAGNOSIS — M86172 Other acute osteomyelitis, left ankle and foot: Secondary | ICD-10-CM | POA: Diagnosis not present

## 2019-10-09 DIAGNOSIS — Z299 Encounter for prophylactic measures, unspecified: Secondary | ICD-10-CM | POA: Diagnosis not present

## 2019-10-09 DIAGNOSIS — R69 Illness, unspecified: Secondary | ICD-10-CM | POA: Diagnosis not present

## 2019-10-09 DIAGNOSIS — M2550 Pain in unspecified joint: Secondary | ICD-10-CM | POA: Diagnosis not present

## 2019-10-09 DIAGNOSIS — R2689 Other abnormalities of gait and mobility: Secondary | ICD-10-CM | POA: Diagnosis not present

## 2019-10-09 DIAGNOSIS — S98119S Complete traumatic amputation of unspecified great toe, sequela: Secondary | ICD-10-CM | POA: Diagnosis not present

## 2019-10-09 DIAGNOSIS — E785 Hyperlipidemia, unspecified: Secondary | ICD-10-CM | POA: Diagnosis not present

## 2019-10-09 DIAGNOSIS — Z7401 Bed confinement status: Secondary | ICD-10-CM | POA: Diagnosis not present

## 2019-10-09 DIAGNOSIS — N1831 Chronic kidney disease, stage 3a: Secondary | ICD-10-CM | POA: Diagnosis not present

## 2019-10-09 DIAGNOSIS — R262 Difficulty in walking, not elsewhere classified: Secondary | ICD-10-CM | POA: Diagnosis not present

## 2019-10-09 DIAGNOSIS — Z7189 Other specified counseling: Secondary | ICD-10-CM | POA: Diagnosis not present

## 2019-10-09 DIAGNOSIS — R0600 Dyspnea, unspecified: Secondary | ICD-10-CM | POA: Diagnosis not present

## 2019-10-09 DIAGNOSIS — E119 Type 2 diabetes mellitus without complications: Secondary | ICD-10-CM | POA: Diagnosis not present

## 2019-10-09 DIAGNOSIS — R5381 Other malaise: Secondary | ICD-10-CM | POA: Diagnosis not present

## 2019-10-09 DIAGNOSIS — I739 Peripheral vascular disease, unspecified: Secondary | ICD-10-CM | POA: Diagnosis not present

## 2019-10-09 DIAGNOSIS — M6281 Muscle weakness (generalized): Secondary | ICD-10-CM | POA: Diagnosis not present

## 2019-10-09 DIAGNOSIS — R52 Pain, unspecified: Secondary | ICD-10-CM | POA: Diagnosis not present

## 2019-10-09 DIAGNOSIS — I10 Essential (primary) hypertension: Secondary | ICD-10-CM | POA: Diagnosis not present

## 2019-10-09 DIAGNOSIS — E1140 Type 2 diabetes mellitus with diabetic neuropathy, unspecified: Secondary | ICD-10-CM | POA: Diagnosis not present

## 2019-10-09 DIAGNOSIS — Z4801 Encounter for change or removal of surgical wound dressing: Secondary | ICD-10-CM | POA: Diagnosis not present

## 2019-10-09 LAB — GLUCOSE, CAPILLARY
Glucose-Capillary: 119 mg/dL — ABNORMAL HIGH (ref 70–99)
Glucose-Capillary: 112 mg/dL — ABNORMAL HIGH (ref 70–99)

## 2019-10-09 MED ORDER — HYDROCODONE-ACETAMINOPHEN 5-325 MG PO TABS: 1.0000 | ORAL_TABLET | Freq: Three times a day (TID) | ORAL | 0 refills | Status: AC | PRN

## 2019-10-09 MED ORDER — CARVEDILOL 3.125 MG PO TABS: 3.1250 mg | ORAL_TABLET | Freq: Two times a day (BID) | ORAL | 0 refills | Status: DC

## 2019-10-09 MED ORDER — SENNOSIDES-DOCUSATE SODIUM 8.6-50 MG PO TABS: 2.0000 | ORAL_TABLET | Freq: Two times a day (BID) | ORAL | 0 refills | Status: AC

## 2019-10-09 MED ORDER — NORTRIPTYLINE HCL 10 MG PO CAPS
20.0000 mg | ORAL_CAPSULE | Freq: Every day | ORAL | Status: DC
  Filled 2019-10-09: qty 2

## 2019-10-09 NOTE — Progress Notes (Signed)
Patient is 1 week status post left great toe amputation.  He is doing well and states he has no complaints and has no pain  Dressing was removed.  Swelling is well controlled well apposed wound edges without any dehiscence some bloody drainage.  No surrounding cellulitis no necrosis  Would recommend daily dry dressing changes may get incision wet in the shower with antibacterial soap such as Dial and water should follow-up in our office in 1 week

## 2019-10-09 NOTE — Discharge Summary (Signed)
Discharge Summary  Peter James. POE:[id number] DOB: 01-16-1926  PCP: [name], MD  Admit date: 09/29/2019 Discharge date: 10/09/2019  Time spent: 35 minutes  Recommendations for Outpatient Follow-up:  1. Follow up with orthopedic surgery in 1 week 2. Cardiomaster contacted on 10/09/19, will arrange for follow up with cardiology outpatient 3. Follow up with your PCP 4. Obtain cardiology referral from your PCP if unable to follow up with your cardiologist 5. Take your medications as prescribed 6. Continue PT OT with assistance and fall precautions.  Discharge Diagnoses:  Active Hospital Problems   Diagnosis Date Noted  . Osteomyelitis of great toe of left foot ([name]) 09/30/2019  . CKD (chronic kidney disease), stage III 09/30/2019  . Type 2 diabetes mellitus ([name])   . CAD (coronary atherosclerotic disease)   . Hyperlipidemia associated with type 2 diabetes mellitus ([place])   . Exertional dyspnea 05/20/2012  . PAD (peripheral artery disease) ([name])   . Hypertension associated with diabetes [name])     Resolved Hospital Problems  No resolved problems to display.    Discharge Condition: Stable  Diet recommendation: Heart healthy carb modified diet  Vitals:   10/09/19 0258 10/09/19 0700  BP: 140/68 139/66  Pulse: 72 71  Resp: 17 16  Temp: 98.4 F (36.9 C) 98.3 F (36.8 C)  SpO2: 97% 98%    History of present illness:  Peter Jamesis a 86 y.o.malewith medical history significant fortype 2 diabetes, CKD stage III, nonobstructive CAD, PAD, hypertension, hyperlipidemia, and remote history of bladder cancer who presents to the ED for evaluation of infection of his left first toe. In the hospital he was diagnosed with left toe cellulitis with osteomyelitis and admitted to the hospital.  10/09/19: Seen and examined at the bedside.  He has no new complaints.  No acute events overnight.  No reported chest pain or dyspnea.  Hospital Course:  Principal Problem:    Osteomyelitis of great toe of left foot ([name]) Active Problems:   PAD (peripheral artery disease) ([name])   Hypertension associated with diabetes ([name])   Exertional dyspnea   CAD (coronary atherosclerotic disease)   Hyperlipidemia associated with type 2 diabetes mellitus ([name])   Type 2 diabetes mellitus ([name])   CKD (chronic kidney disease), stage III  Left big toe cellulitis, ulceration with osteomyelitis post left great toe amputation on 10/01/2019 by Dr. Sharol Given. Completed course of IV cefepime and IV vancomycin Switched to p.o. doxycycline on 10/06/2019, completed course. PT recommending SNF and DME 5 inch wheels roller walker Seen by orthopedic surgery on 10/09/2019, dressing was removed, "Swelling is well controlled well apposed wound edges without any dehiscence some bloody drainage.  No surrounding cellulitis no necrosis.  Would recommend daily dry dressing changes may get incision wet in the shower with antibacterial soap such as Dial and water should follow-up in our office in 1 week" Follow-up with orthopedic surgery in 1 week. Pain control and bowel regimen  Resolved AKI on CKD 2 likely prerenal in the setting of dehydration. Creatinine appears to be at baseline after iv fluid hydration Creatinine 1.2 with GFR greater than 60 Presented with creatinine of 1.68. Continue to avoid nephrotoxins Follow-up with your PCP.  Exertional dyspnea, improved Etiology is unclear. Denies chest pain.   Euvolemic on exam  2D echo done on 09/30/2019 showed LVEF 40 to 45% with global hypokinesis.   Was started on Coreg 3.125 mg twice daily.   Recommend to follow-up with your cardiologist, if unable, obtain referral to cardiology  from your PCP. Cardiomaster contacted on 10/09/19, will arrange for follow up with cardiology outpatient  History of coronary artery disease and peripheral artery disease Continue aspirin, statin, and coreg. Low-dose beta-blocker was added during this admission.  EKG  shows LBBB.  Denies any chest pain.  Needs to follow-up with cardiology as outpatient  Chronic systolic CHF Ejection fraction noted to be 40-45% with global hypokinesis. Euvolemic on exam without exacerbation  Continue carvedilol  He is noted to have elevated creatinine so we will hold off on initiating ACE inhibitor or ARB.  Net I&O -3.1 L Follow-up with cardiology Cardiomaster contacted on 10/09/19, will arrange for follow up with cardiology outpatient.  Essential hypertension Blood pressure is at goal Continue carvedilol and amlodipine Follow-up with your PCP and cardiology  Constipation Continue bowel regimen.  History of BPH Good urine output  Continue Flomax  Dyslipidemia Continue statin  History of gout Continue allopurinol  Diabetes mellitus type 2 with hyperglycemia HbA1c 6.9.  Resume home regimen Follow-up with your PCP  Mild chronic normocytic anemia Hemoglobin stable at 12.0. No evidence of overt bleeding. Follow-up with your PCP  CODE STATUS:DNR    Procedures:  Left great toe amputation 10/01/2019 by Dr. Sharol Given.  Consultations:  Orthopedic surgery, Dr. Sharol Given.  Discharge Exam: BP 139/66   Pulse 71   Temp 98.3 F (36.8 C) (Oral)   Resp 16   Ht 6' 0.99" (1.854 m)   Wt 98.4 kg   SpO2 98%   BMI 28.64 kg/m  . General: 86 y.o. year-old male well developed well nourished in no acute distress.  Alert and oriented x3. . Cardiovascular: Regular rate and rhythm with no rubs or gallops.  No thyromegaly or JVD noted.   [name] Respiratory: Clear to auscultation with no wheezes or rales. Good inspiratory effort. . Abdomen: Soft nontender nondistended with normal bowel sounds x4 quadrants. . Musculoskeletal: No lower extremity edema. 2/4 pulses in all 4 extremities. [name] Psychiatry: Mood is appropriate for condition and setting  Discharge Instructions You were cared for by a hospitalist during your hospital stay. If you have any questions about  your discharge medications or the care you received while you were in the hospital after you are discharged, you can call the unit and asked to speak with the hospitalist on call if the hospitalist that took care of you is not available. Once you are discharged, your primary care physician will handle any further medical issues. Please note that NO REFILLS for any discharge medications will be authorized once you are discharged, as it is imperative that you return to your primary care physician (or establish a relationship with a primary care physician if you do not have one) for your aftercare needs so that they can reassess your need for medications and monitor your lab values.   Allergies as of 10/09/2019   No Known Allergies     Medication List    STOP taking these medications   ammonium lactate 12 % cream Commonly known as: Lac-Hydrin   diclofenac Sodium 1 % Gel Commonly known as: VOLTAREN   losartan 100 MG tablet Commonly known as: COZAAR   meloxicam 7.5 MG tablet Commonly known as: MOBIC   traMADol 50 MG tablet Commonly known as: ULTRAM     TAKE these medications   albuterol 108 (90 Base) MCG/ACT inhaler Commonly known as: VENTOLIN HFA Inhale into the lungs every 6 (six) hours as needed for wheezing or shortness of breath.   allopurinol 300 MG tablet Commonly  known as: ZYLOPRIM Take 300 mg by mouth daily.   Alogliptin Benzoate 12.5 MG Tabs Take 6.25 mg by mouth daily.   amLODipine 10 MG tablet Commonly known as: NORVASC Take 1 tablet (10 mg total) by mouth daily.   aspirin EC 81 MG tablet Take 81 mg by mouth daily. Swallow whole.   atorvastatin 10 MG tablet Commonly known as: LIPITOR Take 1 tablet (10 mg total) by mouth every evening.   carvedilol 3.125 MG tablet Commonly known as: COREG Take 1 tablet (3.125 mg total) by mouth 2 (two) times daily with a meal.   cholecalciferol 25 MCG (1000 UNIT) tablet Commonly known as: VITAMIN D3 Take 1,000 Units by  mouth daily.   clotrimazole 1 % cream Commonly known as: LOTRIMIN Apply 1 application topically 2 (two) times daily.   HYDROcodone-acetaminophen 5-325 MG tablet Commonly known as: NORCO/VICODIN Take 1-2 tablets by mouth 3 (three) times daily as needed for up to 5 days for severe pain (Hold & Call MD if SBP<90, HR<65, RR<10, O2<90, or altered mental status.).   nortriptyline 10 MG capsule Commonly known as: PAMELOR Take 20 mg by mouth at bedtime.   polyethylene glycol 17 g packet Commonly known as: MIRALAX / GLYCOLAX Take 17 g by mouth daily as needed for mild constipation.   senna-docusate 8.6-50 MG tablet Commonly known as: Senokot-S Take 2 tablets by mouth 2 (two) times daily for 5 days.   sildenafil 100 MG tablet Commonly known as: VIAGRA Take 100 mg by mouth daily as needed for erectile dysfunction.   tamsulosin 0.4 MG Caps capsule Commonly known as: FLOMAX Take 0.4 mg by mouth daily.            Durable Medical Equipment  (From admission, onward)         Start     Ordered   10/07/19 [phone]  For home use only DME Walker rolling  Once       Question Answer Comment  Walker: With 5 Inch Wheels   Patient needs a walker to treat with the following condition Ambulatory dysfunction      10/07/19 0612         No Known Allergies  Follow-up Information    Persons, Bevely Palmer, PA In 1 week.   Specialty: Orthopedic Surgery Contact information: [place] [address] [phone]        [name], MD. Call in 1 day(s).   Specialty: Internal Medicine Why: please call for a post hospital follow up appointment Contact information: [address] [place] [address] [place]        Burnell Blanks, MD. Call in 1 day(s).   Specialty: Cardiology Why: Please call for a post hospital follow-up appointment. Contact information: [place] [address] [place] [address] [phone]                The results of  significant diagnostics from this hospitalization (including imaging, microbiology, ancillary and laboratory) are listed below for reference.    Significant Diagnostic Studies: X-ray chest PA and lateral  Result Date: 09/30/2019 CLINICAL DATA:  86 year old male with left foot pain and osteomyelitis. EXAM: CHEST - 2 VIEW COMPARISON:  None FINDINGS: There is eventration of the left hemidiaphragm. There are bibasilar subsegmental densities which may represent atelectasis although infiltrate is not excluded. Clinical correlation is recommended. There is no pleural effusion or pneumothorax. There is mild cardiomegaly. Atherosclerotic calcification of the aortic arch. No acute osseous pathology. IMPRESSION: 1.  Bibasilar atelectasis versus less likely infiltrate. 2. Mild cardiomegaly. 3. Elevated left hemidiaphragm. Electronically Signed   By: Anner Crete M.D.   On: 09/30/2019 01:23   DG Foot Complete Left  Result Date: 09/29/2019 CLINICAL DATA:  Left foot pain.  Infection.  Rule out osteomyelitis. EXAM: LEFT FOOT - COMPLETE 3+ VIEW COMPARISON:  None. FINDINGS: Soft tissue swelling of the great toe. Bony destruction of the tuft of the distal first phalanx compatible osteomyelitis. No foreign body or gas in the soft tissues No other areas of infection. No acute fracture. Hammertoe deformities of the second through fifth digits. IMPRESSION: Osteomyelitis distal first phalanx. Electronically Signed   By: Franchot Gallo M.D.   On: 09/29/2019 15:35   VAS [place] ABI WITH/WO TBI  Result Date: 09/30/2019 LOWER EXTREMITY DOPPLER STUDY Indications: Ulceration LT great toe. High Risk Factors: Hypertension, hyperlipidemia.  Comparison Study: ABI 2009 from outside facility. Performing Technologist: Darlin Coco  Examination Guidelines: A complete evaluation includes at minimum, Doppler waveform signals and systolic blood pressure reading at the level of bilateral brachial, anterior tibial, and posterior tibial arteries,  when vessel segments are accessible. Bilateral testing is considered an integral part of a complete examination. Photoelectric Plethysmograph (PPG) waveforms and toe systolic pressure readings are included as required and additional duplex testing as needed. Limited examinations for reoccurring indications may be performed as noted.  ABI Findings: +---------+------------------+-----+---------+--------+ Right    Rt Pressure (mmHg)IndexWaveform Comment  +---------+------------------+-----+---------+--------+ Brachial 125                    triphasic         +---------+------------------+-----+---------+--------+ PTA      89                0.68 triphasic         +---------+------------------+-----+---------+--------+ DP       91                0.70 biphasic          +---------+------------------+-----+---------+--------+ Great Toe75                0.58 Abnormal          +---------+------------------+-----+---------+--------+ +---------+------------------+-----+---------+------------------------------+ Left     Lt Pressure (mmHg)IndexWaveform Comment                        +---------+------------------+-----+---------+------------------------------+ Brachial 130                    triphasic                               +---------+------------------+-----+---------+------------------------------+ PTA      100               0.77 triphasic                               +---------+------------------+-----+---------+------------------------------+ DP       98                0.75 triphasic                               +---------+------------------+-----+---------+------------------------------+ Great Toe  Unable to obtain due to wound. +---------+------------------+-----+---------+------------------------------+ +-------+-----------+-----------+------------+------------+ ABI/TBIToday's ABIToday's TBIPrevious ABIPrevious TBI  +-------+-----------+-----------+------------+------------+ Right  0.70       0.58                                +-------+-----------+-----------+------------+------------+ Left   0.77                                           +-------+-----------+-----------+------------+------------+  Summary: Right: Resting right ankle-brachial index indicates moderate right lower extremity arterial disease. Left: Resting left ankle-brachial index indicates moderate left lower extremity arterial disease.  *See table(s) above for measurements and observations.  Electronically signed by Harold Barban MD on 09/30/2019 at 4:21:28 PM.   Final    ECHOCARDIOGRAM COMPLETE  Result Date: 09/30/2019    ECHOCARDIOGRAM REPORT   Patient Name:   Cameran Pettey. Date of Exam: 09/30/2019 Medical Rec #:  [id number]           Height:       73.0 in Accession #:    [id number]          Weight:       217.0 lb Date of Birth:  1926-01-19            BSA:          2.228 m Patient Age:    78 years            BP:           157/79 mmHg Patient Gender: M                   HR:           89 bpm. Exam Location:  Inpatient Procedure: 2D Echo, Cardiac Doppler and Color Doppler Indications:    CHF-Acute Diastolic 517.00 / F74.94  History:        Patient has prior history of Echocardiogram examinations, most                 recent 05/29/2012. CAD; Risk Factors:Hypertension, Dyslipidemia,                 Diabetes and Former Smoker. PAD.  Sonographer:    Vickie Epley RDCS Referring Phys: Tyrone [place]  1. Left ventricular ejection fraction, by estimation, is 40 to 45%. The left ventricle has mildly decreased function. The left ventricle demonstrates global hypokinesis. Left ventricular diastolic function could not be evaluated.  2. Right ventricular systolic function is normal. The right ventricular size is normal.  3. The mitral valve is normal in structure. Mild mitral valve regurgitation.  4. The aortic valve is tricuspid. Aortic  valve regurgitation is mild. Mild aortic valve sclerosis is present, with no evidence of aortic valve stenosis.  5. The inferior vena cava is normal in size with greater than 50% respiratory variability, suggesting right atrial pressure of 3 mmHg. Comparison(s): Prior images unable to be directly viewed, comparison made by report only. Conclusion(s)/Recommendation(s): Unable to view prior images, but EF is mild-moderately reduced compared to prior. There is significant dyssynchrony and global hypokinesis, no clear focal wall motion abnormalities. FINDINGS  Left Ventricle: Left ventricular ejection fraction, by estimation, is 40 to 45%. The left ventricle has mildly decreased function. The left ventricle demonstrates global hypokinesis. The left ventricular internal cavity size was  normal in size. There is  no left ventricular hypertrophy. Abnormal (paradoxical) septal motion, consistent with left bundle branch block. Left ventricular diastolic function could not be evaluated. Right Ventricle: The right ventricular size is normal. No increase in right ventricular wall thickness. Right ventricular systolic function is normal. Left Atrium: Left atrial size was normal in size. Right Atrium: Right atrial size was normal in size. Pericardium: There is no evidence of pericardial effusion. Mitral Valve: The mitral valve is normal in structure. Mild mitral valve regurgitation. Tricuspid Valve: The tricuspid valve is normal in structure. Tricuspid valve regurgitation is trivial. Aortic Valve: The aortic valve is tricuspid. Aortic valve regurgitation is mild. Mild aortic valve sclerosis is present, with no evidence of aortic valve stenosis. Pulmonic Valve: The pulmonic valve was not well visualized. Pulmonic valve regurgitation is not visualized. Aorta: The aortic root, ascending aorta and aortic arch are all structurally normal, with no evidence of dilitation or obstruction. Venous: The inferior vena cava is normal in size  with greater than 50% respiratory variability, suggesting right atrial pressure of 3 mmHg. IAS/Shunts: The atrial septum is grossly normal.  LEFT VENTRICLE PLAX 2D LVOT diam:     2.40 cm LV SV:         65 LV SV Index:   29 LVOT Area:     4.52 cm  LV Volumes (MOD) LV vol d, MOD A2C: 141.0 ml LV vol d, MOD A4C: 94.0 ml LV vol s, MOD A2C: 70.7 ml LV vol s, MOD A4C: 66.4 ml LV SV MOD A2C:     70.3 ml LV SV MOD A4C:     94.0 ml LV SV MOD BP:      49.6 ml RIGHT VENTRICLE TAPSE (M-mode): 1.6 cm LEFT ATRIUM             Index       RIGHT ATRIUM           Index LA Vol (A2C):   36.2 ml 16.25 ml/m RA Area:     11.30 cm LA Vol (A4C):   33.4 ml 14.99 ml/m RA Volume:   22.40 ml  10.06 ml/m LA Biplane Vol: 36.2 ml 16.25 ml/m  AORTIC VALVE LVOT Vmax:   93.80 cm/s LVOT Vmean:  63.700 cm/s LVOT VTI:    0.143 m  AORTA Ao Root diam: 3.50 cm  SHUNTS Systemic VTI:  0.14 m Systemic Diam: 2.40 cm Buford Dresser MD Electronically signed by Buford Dresser MD Signature Date/Time: 09/30/2019/6:50:16 PM    Final     Microbiology: Recent Results (from the past 240 hour(s))  Blood culture (routine x 2)     Status: None   Collection Time: 09/30/19  1:21 AM   Specimen: BLOOD RIGHT HAND  Result Value Ref Range Status   Specimen Description BLOOD RIGHT HAND  Final   Special Requests   Final    BOTTLES DRAWN AEROBIC AND ANAEROBIC Blood Culture adequate volume   Culture   Final    NO GROWTH 5 DAYS Performed at [facility], [address], [place], [place] [address]    Report Status 10/05/2019 FINAL  Final  Blood culture (routine x 2)     Status: None   Collection Time: 09/30/19  1:24 AM   Specimen: BLOOD  Result Value Ref Range Status   Specimen Description BLOOD LEFT ANTECUBITAL  Final   Special Requests   Final    BOTTLES DRAWN AEROBIC AND ANAEROBIC Blood Culture adequate volume   Culture  Final    NO GROWTH 5 DAYS Performed at [facility], [place] [address], [place], [place] [address]     Report Status 10/05/2019 FINAL  Final  SARS Coronavirus 2 by RT PCR (hospital order, performed in [facility] hospital lab) Nasopharyngeal Nasopharyngeal Swab     Status: None   Collection Time: 09/30/19  2:23 AM   Specimen: Nasopharyngeal Swab  Result Value Ref Range Status   SARS Coronavirus 2 NEGATIVE NEGATIVE Final    Comment: (NOTE) SARS-CoV-2 target nucleic acids are NOT DETECTED.  The SARS-CoV-2 RNA is generally detectable in upper and lower respiratory specimens during the acute phase of infection. The lowest concentration of SARS-CoV-2 viral copies this assay can detect is 250 copies / mL. A negative result does not preclude SARS-CoV-2 infection and should not be used as the sole basis for treatment or other patient management decisions.  A negative result may occur with improper specimen collection / handling, submission of specimen other than nasopharyngeal swab, presence of viral mutation(s) within the areas targeted by this assay, and inadequate number of viral copies (<250 copies / mL). A negative result must be combined with clinical observations, patient history, and epidemiological information.  Fact Sheet for Patients:   StrictlyIdeas.no  Fact Sheet for Healthcare Providers: BankingDealers.co.za  This test is not yet approved or  cleared by the [place] FDA and has been authorized for detection and/or diagnosis of SARS-CoV-2 by FDA under an Emergency Use Authorization (EUA).  This EUA will remain in effect (meaning this test can be used) for the duration of the COVID-19 declaration under Section 564(b)(1) of the Act, 21 U.S.C. section 360bbb-3(b)(1), unless the authorization is terminated or revoked sooner.  Performed at [facility], [place] [address], [place], [place] [address]   Surgical pcr screen     Status: None   Collection Time: 09/30/19  7:11 PM   Specimen: Nasal Mucosa; Nasal Swab  Result Value  Ref Range Status   MRSA, PCR NEGATIVE NEGATIVE Final   Staphylococcus aureus NEGATIVE NEGATIVE Final    Comment: (NOTE) The Xpert SA Assay (FDA approved for NASAL specimens in patients 57 years of age and older), is one component of a comprehensive surveillance program. It is not intended to diagnose infection nor to guide or monitor treatment. Performed at [facility], [place] [address] [place],  [address]   SARS Coronavirus 2 by RT PCR (hospital order, performed in [facility] hospital lab) Nasopharyngeal Nasopharyngeal Swab     Status: None   Collection Time: 10/08/19  5:19 PM   Specimen: Nasopharyngeal Swab  Result Value Ref Range Status   SARS Coronavirus 2 NEGATIVE NEGATIVE Final    Comment: (NOTE) SARS-CoV-2 target nucleic acids are NOT DETECTED.  The SARS-CoV-2 RNA is generally detectable in upper and lower respiratory specimens during the acute phase of infection. The lowest concentration of SARS-CoV-2 viral copies this assay can detect is 250 copies / mL. A negative result does not preclude SARS-CoV-2 infection and should not be used as the sole basis for treatment or other patient management decisions.  A negative result may occur with improper specimen collection / handling, submission of specimen other than nasopharyngeal swab, presence of viral mutation(s) within the areas targeted by this assay, and inadequate number of viral copies (<250 copies / mL). A negative result must be combined with clinical observations, patient history, and epidemiological information.  Fact Sheet for Patients:   StrictlyIdeas.no  Fact Sheet for Healthcare  Providers: BankingDealers.co.za  This test is not yet approved or  cleared by the [place] and has been authorized for detection and/or diagnosis of SARS-CoV-2 by FDA under an Emergency Use Authorization (EUA).  This EUA will remain in effect (meaning this test can  be used) for the duration of the COVID-19 declaration under Section 564(b)(1) of the Act, 21 U.S.C. section 360bbb-3(b)(1), unless the authorization is terminated or revoked sooner.  Performed at [facility], [place] [address] [place], [place] [address]      Labs: Basic Metabolic Panel: Recent Labs  Lab 10/03/19 0342 10/04/19 0749 10/06/19 0737  NA 137 139 137  K 4.5 4.3 4.2  CL 106 107 106  CO2 21* 24 23  GLUCOSE 93 106* 119*  BUN 25* 20 20  CREATININE 1.35* 1.35* 1.20  CALCIUM 9.1 9.4 9.4  MG 1.9 1.9  --    Liver Function Tests: Recent Labs  Lab 10/03/19 0342  AST 28  ALT 23  ALKPHOS 58  BILITOT 0.2*  PROT 6.9  ALBUMIN 3.1*   No results for input(s): LIPASE, AMYLASE in the last 168 hours. No results for input(s): AMMONIA in the last 168 hours. CBC: Recent Labs  Lab 10/03/19 0342 10/04/19 0749 10/06/19 0737  WBC 8.8 9.2 8.3  NEUTROABS 6.0 6.1 5.2  HGB 11.9* 12.0* 12.0*  HCT 36.2* 36.8* 36.8*  MCV 90.0 89.1 88.9  PLT 329 336 325   Cardiac Enzymes: No results for input(s): CKTOTAL, CKMB, CKMBINDEX, TROPONINI in the last 168 hours. BNP: BNP (last 3 results) Recent Labs    10/02/19 0249 10/03/19 0342 10/04/19 0749  BNP 108.5* 85.3 72.7    ProBNP (last 3 results) No results for input(s): PROBNP in the last 8760 hours.  CBG: Recent Labs  Lab 10/08/19 0735 10/08/19 1215 10/08/19 1617 10/08/19 2003 10/09/19 0913  GLUCAP 103* 157* 100* 115* 119*       Signed:  [name], MD [facility] 10/09/2019, 12:04 PM

## 2019-10-09 NOTE — Plan of Care (Signed)
  Problem: Safety: Goal: Ability to remain free from injury will improve Outcome: Progressing   

## 2019-10-09 NOTE — Progress Notes (Signed)
Discharge paperwork given to [facility] for receiving facility. Report given to receiving facility. Pt not in distress and tolerated well. 

## 2019-10-09 NOTE — TOC Transition Note (Signed)
Transition of Care [facility]) - CM/SW Discharge Note   Patient Details  Name: Peter James. MRN: [id number] Date of Birth: 1925-04-03  [facility]) CM/SW Contact:  Sharin Mons, RN Phone Number: [phone] 10/09/2019, 11:22 AM   Clinical Narrative:    Patient will DC to: [facility] SNF/ Rehab Anticipated DC date: 10/09/2019 Family notified: daughter Transport by: Corey Harold  Per MD patient ready for DC today. RN, patient, patient's family, and facility notified of DC. Discharge Summary and FL2 sent to facility. RN to call report prior to discharge ( [phone] ). DC packet on chart. Ambulance transport requested for patient.   NCM will sign off for now as intervention is no longer needed. Please consult [place] again if new needs arise.   Final next level of care: Skilled Nursing Facility Barriers to Discharge: No Barriers Identified   Patient Goals and CMS Choice        Discharge Placement                       Discharge Plan and Services                                     Social Determinants of Health (SDOH) Interventions     Readmission Risk Interventions No flowsheet data found.

## 2019-10-09 NOTE — Discharge Instructions (Signed)
Osteomyelitis, Adult  Bone infections (osteomyelitis) occur when bacteria or other germs get inside a bone. This can happen if you have an infection in another part of your body that spreads through your blood. Germs from your skin or from outside of your body can also cause this type of infection if you have a wound or a broken bone (fracture) that breaks the skin. Bone infections need to be treated quickly to prevent bone damage and to prevent the infection from spreading to other areas of your body. What are the causes? Most bone infections are caused by bacteria. They can also be caused by other germs, such as viruses and funguses. What increases the risk? You are more likely to develop this condition if you:  Recently had surgery, especially bone or joint surgery.  Have a long-term (chronic) disease, such as: ? Diabetes. ? HIV (human immunodeficiency virus). ? Rheumatoid arthritis. ? Sickle cell anemia. ? Kidney disease that requires dialysis.  Are aged 60 years or older.  Have a condition or take medicines that block or weaken your body's defense system (immune system).  Have a condition that reduces your blood flow.  Have an artificial joint.  Have had a joint or bone repaired with plates or screws (surgical hardware).  Use IV drugs.  Have a central line for IV access.  Have had trauma, such as stepping on a nail or a broken bone that came through the skin. What are the signs or symptoms? Symptoms vary depending on the type and location of your infection. Common symptoms of bone infections include:  Fever and chills.  Skin redness and warmth.  Swelling.  Pain and stiffness.  Drainage of fluid or pus near the infection. How is this diagnosed? This condition may be diagnosed based on:  Your symptoms and medical history.  A physical exam.  Tests, such as: ? A sample of tissue, fluid, or blood taken to be examined under a microscope. ? Pus or discharge swabbed  from a wound for testing to identify germs and to determine what type of medicine will kill them (culture and sensitivity). ? Blood tests.  Imaging studies. These may include: ? X-rays. ? MRI. ? CT scan. ? Bone scan. ? Ultrasound. How is this treated? Treatment for this condition depends on the cause and type of infection. Antibiotic medicines are usually the first treatment for a bone infection. This may be done in a hospital at first. You may have to continue IV antibiotics at home or take antibiotics by mouth for several weeks after that. Other treatments may include surgery to remove:  Dead or dying tissue from a bone.  An infected artificial joint.  Infected plates or screws that were used to repair a broken bone. Follow these instructions at home: Medicines   Take over-the-counter and prescription medicines only as told by your health care provider.  Take your antibiotic medicine as told by your health care provider. Do not stop taking the antibiotic even if you start to feel better.  Follow instructions from your health care provider about how to take IV antibiotics at home. You may need to have a nurse come to your home to give you the IV antibiotics. General instructions   Ask your health care provider if you have any restrictions on your activities.  If directed, put ice on the affected area: ? Put ice in a plastic bag. ? Place a towel between your skin and the bag. ? Leave the ice on for 20   minutes, 2-3 times a day.  Wash your hands often with soap and water. If soap and water are not available, use hand sanitizer.  Do not use any products that contain nicotine or tobacco, such as cigarettes and e-cigarettes. These can delay bone healing. If you need help quitting, ask your health care provider.  Keep all follow-up visits as told by your health care provider. This is important. Contact a health care provider if:  You develop a fever or chills.  You have  redness, warmth, pain, or swelling that returns after treatment. Get help right away if:  You have rapid breathing or you have trouble breathing.  You have chest pain.  You cannot drink fluids or make urine.  The affected area swells, changes color, or turns blue.  You have numbness or severe pain in the affected area. Summary  Bone infections (osteomyelitis) occur when bacteria or other germs get inside a bone.  You may be more likely to get this type of infection if you have a condition, such as diabetes, that lowers your ability to fight infection or increases your chances of getting an infection.  Most bone infections are caused by bacteria. They can also be caused by other germs, such as viruses and funguses.  Treatment for this condition usually starts with taking antibiotics. Further treatment depends on the cause and type of infection. This information is not intended to replace advice given to you by your health care provider. Make sure you discuss any questions you have with your health care provider. Document Revised: 03/22/2017 Document Reviewed: 03/15/2017 Elsevier Patient Education  [place] of Breath, Adult Shortness of breath means you have trouble breathing. Shortness of breath could be a sign of a medical problem. Follow these instructions at home:   Watch for any changes in your symptoms.  Do not use any products that contain nicotine or tobacco, such as cigarettes, e-cigarettes, and chewing tobacco.  Do not smoke. Smoking can cause shortness of breath. If you need help to quit smoking, ask your doctor.  Avoid things that can make it harder to breathe, such as: ? Mold. ? Dust. ? Air pollution. ? Chemical smells. ? Things that can cause allergy symptoms (allergens), if you have allergies.  Keep your living space clean. Use products that help remove mold and dust.  Rest as needed. Slowly return to your normal activities.  Take  over-the-counter and prescription medicines only as told by your doctor. This includes oxygen therapy and inhaled medicines.  Keep all follow-up visits as told by your doctor. This is important. Contact a doctor if:  Your condition does not get better as soon as expected.  You have a hard time doing your normal activities, even after you rest.  You have new symptoms. Get help right away if:  Your shortness of breath gets worse.  You have trouble breathing when you are resting.  You feel light-headed or you pass out (faint).  You have a cough that is not helped by medicines.  You cough up blood.  You have pain with breathing.  You have pain in your chest, arms, shoulders, or belly (abdomen).  You have a fever.  You cannot walk up stairs.  You cannot exercise the way you normally do. These symptoms may represent a serious problem that is an emergency. Do not wait to see if the symptoms will go away. Get medical help right away. Call your local emergency services (911 in the [place]  Do not drive yourself to the hospital. Summary  Shortness of breath is when you have trouble breathing enough air. It can be a sign of a medical problem.  Avoid things that make it hard for you to breathe, such as smoking, pollution, mold, and dust.  Watch for any changes in your symptoms. Contact your doctor if you do not get better or you get worse. This information is not intended to replace advice given to you by your health care provider. Make sure you discuss any questions you have with your health care provider. Document Revised: 08/06/2017 Document Reviewed: 08/06/2017 Elsevier Patient Education  [place].

## 2019-10-09 NOTE — Progress Notes (Addendum)
Occupational Therapy Treatment Patient Details Name: Peter James. MRN: [id number] DOB: 08-08-1925 Today's Date: 10/09/2019    History of present illness Pt is a 86 yo male admitted 09/29/19 with L great toe osteomyelitis requiring L great toe amputation on 10/01/19. PMHx: HTN, HLD, neuropathy, borderline DM.   OT comments  Pt plans to go to rehab for rehab prior to home  Follow Up Recommendations  SNF;Supervision/Assistance - 24 hour    Equipment Recommendations  Other (comment) (defer to next venue of care)       Precautions / Restrictions Precautions Precautions: Fall Required Braces or Orthoses: Other Brace Other Brace: post op shoe on L Restrictions Weight Bearing Restrictions: Yes LLE Weight Bearing: Non weight bearing Other Position/Activity Restrictions: pts son brought sneaker today (7/20) for R foot. shoe on R greatly assists with support, stability and balance.       Mobility Bed Mobility               General bed mobility comments: pt received in chair  Transfers Overall transfer level: Needs assistance Equipment used: Rolling walker (2 wheeled) Transfers: Sit to/from [facility] Sit to Stand: Min assist Stand pivot transfers: Min assist            Balance Overall balance assessment: Needs assistance Sitting-balance support: Single extremity supported;Feet supported Sitting balance-Leahy Scale: Good     Standing balance support: Bilateral upper extremity supported Standing balance-Leahy Scale: Poor Standing balance comment: reliant on UE support to safely stand without wt on LLE                           ADL either performed or assessed with clinical judgement   ADL Overall ADL's : Needs assistance/impaired     Grooming: Wash/dry hands;Wash/dry face;Set up;Sitting       Lower Body Bathing: Minimal assistance;Sit to/from stand;Cueing for safety;Cueing for sequencing   Upper Body Dressing : Set up;Sitting    Lower Body Dressing: Sit to/from stand;Cueing for safety;Cueing for sequencing;Cueing for compensatory techniques   Toilet Transfer: Minimal assistance;Ambulation;RW   Toileting- Clothing Manipulation and Hygiene: Minimal assistance;Sit to/from stand       Functional mobility during ADLs: Minimal assistance;Cueing for sequencing;Rolling walker       Vision Baseline Vision/History: No visual deficits Patient Visual Report: No change from baseline            Cognition Arousal/Alertness: Awake/alert Behavior During Therapy: WFL for tasks assessed/performed Overall Cognitive Status: Within Functional Limits for tasks assessed                                                     Pertinent Vitals/ Pain       Pain Assessment: No/denies pain         Frequency  Min 2X/week        Progress Toward Goals  OT Goals(current goals can now be found in the care plan section)  Progress towards OT goals: Progressing toward goals  Acute Rehab OT Goals Patient Stated Goal: agreeable to rehab prior to return home OT Goal Formulation: With patient Time For Goal Achievement: 10/15/19 Potential to Achieve Goals: Good  Plan Discharge plan remains appropriate;Frequency remains appropriate       AM-PAC OT "6 Clicks" Daily Activity     Outcome Measure  Help from another person eating meals?: None Help from another person taking care of personal grooming?: A Little Help from another person toileting, which includes using toliet, bedpan, or urinal?: A Little Help from another person bathing (including washing, rinsing, drying)?: A Little Help from another person to put on and taking off regular upper body clothing?: None Help from another person to put on and taking off regular lower body clothing?: None 6 Click Score: 21    End of Session Equipment Utilized During Treatment: Gait belt;Rolling walker;Other (comment) (post op shoe)  OT Visit Diagnosis:  Unsteadiness on feet (R26.81);Other abnormalities of gait and mobility (R26.89)   Activity Tolerance Patient tolerated treatment well   Patient Left in chair;with call bell/phone within reach   Nurse Communication Mobility status        Time: 1047-1100 OT Time Calculation (min): 13 min  Charges: OT General Charges $OT Visit: 1 Visit OT Treatments $Self Care/Home Management : 8-22 mins  Kari Baars, [place] [phone] Office- [place], Edwena Felty D 10/09/2019, 12:46 PM

## 2019-10-10 DIAGNOSIS — E785 Hyperlipidemia, unspecified: Secondary | ICD-10-CM | POA: Diagnosis not present

## 2019-10-10 DIAGNOSIS — S98112A Complete traumatic amputation of left great toe, initial encounter: Secondary | ICD-10-CM | POA: Diagnosis not present

## 2019-10-10 DIAGNOSIS — I10 Essential (primary) hypertension: Secondary | ICD-10-CM | POA: Diagnosis not present

## 2019-10-10 DIAGNOSIS — M869 Osteomyelitis, unspecified: Secondary | ICD-10-CM | POA: Diagnosis not present

## 2019-10-13 DIAGNOSIS — R69 Illness, unspecified: Secondary | ICD-10-CM | POA: Diagnosis not present

## 2019-10-13 DIAGNOSIS — M869 Osteomyelitis, unspecified: Secondary | ICD-10-CM | POA: Diagnosis not present

## 2019-10-13 DIAGNOSIS — I739 Peripheral vascular disease, unspecified: Secondary | ICD-10-CM | POA: Diagnosis not present

## 2019-10-13 DIAGNOSIS — E119 Type 2 diabetes mellitus without complications: Secondary | ICD-10-CM | POA: Diagnosis not present

## 2019-10-16 DIAGNOSIS — S98112A Complete traumatic amputation of left great toe, initial encounter: Secondary | ICD-10-CM | POA: Diagnosis not present

## 2019-10-16 DIAGNOSIS — R262 Difficulty in walking, not elsewhere classified: Secondary | ICD-10-CM | POA: Diagnosis not present

## 2019-10-16 DIAGNOSIS — R2689 Other abnormalities of gait and mobility: Secondary | ICD-10-CM | POA: Diagnosis not present

## 2019-10-17 ENCOUNTER — Encounter: Payer: Self-pay | Admitting: Physician Assistant

## 2019-10-17 ENCOUNTER — Ambulatory Visit (INDEPENDENT_AMBULATORY_CARE_PROVIDER_SITE_OTHER): Payer: Medicare HMO | Admitting: Physician Assistant

## 2019-10-17 VITALS — Ht 72.0 in | Wt 217.0 lb

## 2019-10-17 DIAGNOSIS — M869 Osteomyelitis, unspecified: Secondary | ICD-10-CM

## 2019-10-17 NOTE — Progress Notes (Signed)
Office Visit Note   Patient: Peter James.           Date of Birth: 15-Dec-1925           MRN: [id number] Visit Date: 10/17/2019              Requested by: [name], MD [address] [place],  [place] [address] PCP: [name], MD  Chief Complaint  Patient presents with  . Left Foot - Routine Post Op    10/01/19 left GT amputation at MTP joint      HPI: This is a pleasant gentleman who is 2-1/2 weeks status post left great toe amputation he is doing well.  He has been at a rehab facility as he lives on his own and he wants to make sure that he is fully ambulatory.  He does have family that lives close to his home  Assessment & Plan: Visit Diagnoses: No diagnosis found.  Plan: Sutures will be harvested today.  He may get the area wet with soap and water.  He may apply a dry clean sock daily.  He will heel weight-bear for the next week and then may begin weightbearing as tolerated in a postop shoe.  I have given him a prescription for a carbon plate to be put in his regular shoes.  Follow-up in 2 weeks  Follow-Up Instructions: No follow-ups on file.   Ortho Exam  Patient is alert, oriented, no adenopathy, well-dressed, normal affect, normal respiratory effort. Focused examination of his left foot demonstrates well-healed surgical incision.  He has no cellulitis no drainage well apposed wound edges.  He has no soft tissue swelling.    Imaging: No results found. No images are attached to the encounter.  Labs: Lab Results  Component Value Date   HGBA1C 6.9 (H) 09/30/2019   CRP 0.6 10/04/2019   CRP 0.8 10/03/2019   CRP 1.7 (H) 10/02/2019   REPTSTATUS 10/05/2019 FINAL 09/30/2019   CULT  09/30/2019    NO GROWTH 5 DAYS Performed at [facility], [place] [address], [place], [place] [id number]      Lab Results  Component Value Date   ALBUMIN 3.1 (L) 10/03/2019   ALBUMIN 2.9 (L) 10/02/2019   ALBUMIN 3.1 (L) 10/01/2019    Lab Results  Component Value Date   MG  1.9 10/04/2019   MG 1.9 10/03/2019   MG 1.9 10/02/2019   No results found for: VD25OH  No results found for: PREALBUMIN CBC EXTENDED Latest Ref Rng & Units 10/06/2019 10/04/2019 10/03/2019  WBC 4.0 - 10.5 K/uL 8.3 9.2 8.8  RBC 4.22 - 5.81 MIL/uL 4.14(L) 4.13(L) 4.02(L)  HGB 13.0 - 17.0 g/dL 12.0(L) 12.0(L) 11.9(L)  HCT 39 - 52 % 36.8(L) 36.8(L) 36.2(L)  PLT 150 - 400 K/uL 325 336 329  NEUTROABS 1.7 - 7.7 K/uL 5.2 6.1 6.0  LYMPHSABS 0.7 - 4.0 K/uL 1.9 1.9 2.0     Body mass index is 29.43 kg/m.  Orders:  No orders of the defined types were placed in this encounter.  No orders of the defined types were placed in this encounter.    Procedures: No procedures performed  Clinical Data: No additional findings.  ROS:  All other systems negative, except as noted in the HPI. Review of Systems  Objective: Vital Signs: Ht 6' (1.829 m)   Wt (!) 217 lb (98.4 kg)   BMI 29.43 kg/m   Specialty Comments:  No specialty comments available.  PMFS History:  Patient Active Problem List   Diagnosis Date Noted  . Osteomyelitis of great toe of left foot ([place]) 09/30/2019  . CKD (chronic kidney disease), stage III 09/30/2019  . Type 2 diabetes mellitus ([place])   . CAD (coronary atherosclerotic disease)   . Hyperlipidemia associated with type 2 diabetes mellitus ([place])   . Exertional dyspnea 05/20/2012  . PAD (peripheral artery disease) ([place])   . Hypertension associated with diabetes ([place])   . Valvular heart disease    Past Medical History:  Diagnosis Date  . Acute lower GI bleeding    Recurrent: 2011, 2012  . Bladder cancer ([place])    Transitional cell, 2001  . CAD (coronary atherosclerotic disease)    Nonobstructive, 05/2012  . Exertional dyspnea    EF 22-29%, grade 1 diastolic dysfunction, 09/9890  . HLD (hyperlipidemia)   . HTN (hypertension)   . Neuropathy   . Other insomnia   . PAD (peripheral artery disease) ([place])    ABIs, 2009: 0.89 right; 0.99 left  . Type 2 diabetes  mellitus ([place])   . Valvular heart disease   . Vertigo     Family History  Problem Relation Age of Onset  . Heart attack Mother   . Heart attack Father   . Heart attack Sister   . Heart attack Sister   . Heart attack Sister   . Heart attack Sister     Past Surgical History:  Procedure Laterality Date  . AMPUTATION Left 10/01/2019   Procedure: LEFT GREAT TOE AMPUTATION AT METATARSOPHALANGEAL JOINT;  Surgeon: [name], MD;  Location: [facility];  Service: Orthopedics;  Laterality: Left;  . CATARACT EXTRACTION     bi lateral  . COLONOSCOPY    . HEMORRHOID SURGERY    . INGUINAL HERNIA REPAIR     Social History   Occupational History  . Not on file  Tobacco Use  . Smoking status: Former Smoker    Quit date: 04/19/1972    Years since quitting: 47.5  . Smokeless tobacco: Never Used  Substance and Sexual Activity  . Alcohol use: Never  . Drug use: Never  . Sexual activity: Not on file

## 2019-10-21 DIAGNOSIS — I10 Essential (primary) hypertension: Secondary | ICD-10-CM | POA: Diagnosis not present

## 2019-10-21 DIAGNOSIS — M869 Osteomyelitis, unspecified: Secondary | ICD-10-CM | POA: Diagnosis not present

## 2019-10-21 DIAGNOSIS — S98112A Complete traumatic amputation of left great toe, initial encounter: Secondary | ICD-10-CM | POA: Diagnosis not present

## 2019-10-21 DIAGNOSIS — E785 Hyperlipidemia, unspecified: Secondary | ICD-10-CM | POA: Diagnosis not present

## 2019-10-21 DIAGNOSIS — N1831 Chronic kidney disease, stage 3a: Secondary | ICD-10-CM | POA: Diagnosis not present

## 2019-10-21 DIAGNOSIS — I739 Peripheral vascular disease, unspecified: Secondary | ICD-10-CM | POA: Diagnosis not present

## 2019-10-21 DIAGNOSIS — Z299 Encounter for prophylactic measures, unspecified: Secondary | ICD-10-CM | POA: Diagnosis not present

## 2019-10-21 DIAGNOSIS — J449 Chronic obstructive pulmonary disease, unspecified: Secondary | ICD-10-CM | POA: Diagnosis not present

## 2019-10-23 DIAGNOSIS — I10 Essential (primary) hypertension: Secondary | ICD-10-CM | POA: Diagnosis not present

## 2019-10-23 DIAGNOSIS — E785 Hyperlipidemia, unspecified: Secondary | ICD-10-CM | POA: Diagnosis not present

## 2019-10-23 DIAGNOSIS — S98112A Complete traumatic amputation of left great toe, initial encounter: Secondary | ICD-10-CM | POA: Diagnosis not present

## 2019-10-23 DIAGNOSIS — M869 Osteomyelitis, unspecified: Secondary | ICD-10-CM | POA: Diagnosis not present

## 2019-10-29 ENCOUNTER — Telehealth: Payer: Self-pay | Admitting: Physician Assistant

## 2019-10-29 NOTE — Telephone Encounter (Signed)
Pt is calling the office back from a message the scheduler left him. Pt states someone called him about an appt with Peter Bhagat PA-C on 8/16.  Pt states he can come to this appt, but will need to know our location and time of appt.  Endorsed to the pt our office location and that his appt is scheduled for next Monday 8/16 at 27 with Peter Bhagat PA-C.  Advised him to arrive 15 mins prior to this appt.  Pt verbalized understanding and agrees with this plan.  Pt was more than gracious for all the assistance provided.

## 2019-10-29 NOTE — Telephone Encounter (Signed)
Follow Up:   Pt said he received a call this morning, but did not know who it was.

## 2019-10-31 ENCOUNTER — Encounter: Payer: Self-pay | Admitting: Family

## 2019-10-31 ENCOUNTER — Ambulatory Visit (INDEPENDENT_AMBULATORY_CARE_PROVIDER_SITE_OTHER): Payer: Medicare HMO | Admitting: Family

## 2019-10-31 VITALS — Ht 72.0 in | Wt 217.0 lb

## 2019-10-31 DIAGNOSIS — Z89412 Acquired absence of left great toe: Secondary | ICD-10-CM

## 2019-10-31 DIAGNOSIS — M869 Osteomyelitis, unspecified: Secondary | ICD-10-CM

## 2019-11-02 NOTE — Progress Notes (Deleted)
Cardiology Office Note    Date:  11/02/2019   ID:  Peter James., DOB [birth year], MRN [id number]  PCP:  [name], MD  Cardiologist:  New  Chief Complaint: Reestablished cardiac care  History of Present Illness:   Peter James. is a 86 y.o. male with hx of non obstructive CAD, CKD III, DM, HTN, HLD and remote bladder cancer seen for hospital follow up.   Cath 05/2012 by Dr. Maisie Fus showed mild non obstructive CAD. Recommended medical therapy.   Recently admitted 09/2019 for osteomyelitis of great toe of left foot s/p left great toe amputation by Dr. Sharol Given 10/01/19. Reported DOE. Echo showed LVEF of 40-45% with global hypokinesis. Started coreg and advised to follow up with cardiology.   Here to reestablished cardiac care.     Past Medical History:  Diagnosis Date  . Acute lower GI bleeding    Recurrent: 2011, 2012  . Bladder cancer ([place])    Transitional cell, 2001  . CAD (coronary atherosclerotic disease)    Nonobstructive, 05/2012  . Exertional dyspnea    EF 50-93%, grade 1 diastolic dysfunction, 04/6710  . HLD (hyperlipidemia)   . HTN (hypertension)   . Neuropathy   . Other insomnia   . PAD (peripheral artery disease) ([place])    ABIs, 2009: 0.89 right; 0.99 left  . Type 2 diabetes mellitus ([place])   . Valvular heart disease   . Vertigo     Past Surgical History:  Procedure Laterality Date  . AMPUTATION Left 10/01/2019   Procedure: LEFT GREAT TOE AMPUTATION AT METATARSOPHALANGEAL JOINT;  Surgeon: [name], MD;  Location: [employer] [facility];  Service: Orthopedics;  Laterality: Left;  . CATARACT EXTRACTION     bi lateral  . COLONOSCOPY    . HEMORRHOID SURGERY    . INGUINAL HERNIA REPAIR      Current Medications: Prior to Admission medications   Medication Sig Start Date End Date Taking? Authorizing Provider  albuterol (VENTOLIN HFA) 108 (90 Base) MCG/ACT inhaler Inhale into the lungs every 6 (six) hours as needed for wheezing or shortness of breath.     [provider]  allopurinol (ZYLOPRIM) 300 MG tablet Take 300 mg by mouth daily.     [provider]  Alogliptin Benzoate 12.5 MG TABS Take 6.25 mg by mouth daily.    [provider]  amLODipine (NORVASC) 10 MG tablet Take 1 tablet (10 mg total) by mouth daily. 06/03/12   [name], [name], PA-C  aspirin EC 81 MG tablet Take 81 mg by mouth daily. Swallow whole.    [provider]  atorvastatin (LIPITOR) 10 MG tablet Take 1 tablet (10 mg total) by mouth every evening. 10/17/12   [name], [name], PA-C  carvedilol (COREG) 3.125 MG tablet Take 1 tablet (3.125 mg total) by mouth 2 (two) times daily with a meal. 10/09/19 11/08/19  [name], DO  cholecalciferol (VITAMIN D3) 25 MCG (1000 UNIT) tablet Take 1,000 Units by mouth daily.    [provider]  clotrimazole (LOTRIMIN) 1 % cream Apply 1 application topically 2 (two) times daily.    [provider]  nortriptyline (PAMELOR) 10 MG capsule Take 20 mg by mouth at bedtime.     [provider]  polyethylene glycol (MIRALAX / GLYCOLAX) 17 g packet Take 17 g by mouth daily as needed for mild constipation.    [provider]  sildenafil (VIAGRA) 100 MG tablet Take 100 mg by mouth daily as needed for  erectile dysfunction.    [provider]  tamsulosin (FLOMAX) 0.4 MG CAPS capsule Take 0.4 mg by mouth daily.     [provider]    Allergies:   Patient has no known allergies.   Social History   Socioeconomic History  . Marital status: Married    Spouse name: Not on file  . Number of children: Not on file  . Years of education: Not on file  . Highest education level: Not on file  Occupational History  . Not on file  Tobacco Use  . Smoking status: Former Smoker    Quit date: 04/19/1972    Years since quitting: 47.5  . Smokeless tobacco: Never Used  Substance and Sexual Activity  . Alcohol use: Never  . Drug use: Never  . Sexual activity: Not on file  Other  Topics Concern  . Not on file  Social History Narrative  . Not on file   Social Determinants of Health   Financial Resource Strain:   . Difficulty of Paying Living Expenses:   Food Insecurity:   . Worried About Charity fundraiser in the Last Year:   . Arboriculturist in the Last Year:   Transportation Needs:   . Film/video editor (Medical):   [name] Lack of Transportation (Non-Medical):   Physical Activity:   . Days of Exercise per Week:   . Minutes of Exercise per Session:   Stress:   . Feeling of Stress :   Social Connections:   . Frequency of Communication with Friends and Family:   . Frequency of Social Gatherings with Friends and Family:   . Attends Religious Services:   . Active Member of Clubs or Organizations:   . Attends Archivist Meetings:   [name] Marital Status:      Family History:  The patient's family history includes Heart attack in his father, mother, sister, sister, sister, and sister. ***  ROS:   Please see the history of present illness.    ROS All other systems reviewed and are negative.   PHYSICAL EXAM:   VS:  There were no vitals taken for this visit.   GEN: Well nourished, well developed, in no acute distress  HEENT: normal  Neck: no JVD, carotid bruits, or masses Cardiac: ***RRR; no murmurs, rubs, or gallops,no edema  Respiratory:  clear to auscultation bilaterally, normal work of breathing GI: soft, nontender, nondistended, + BS MS: no deformity or atrophy  Skin: warm and dry, no rash Neuro:  Alert and Oriented x 3, Strength and sensation are intact Psych: euthymic mood, full affect  Wt Readings from Last 3 Encounters:  10/31/19 217 lb (98.4 kg)  10/17/19 (!) 217 lb (98.4 kg)  10/01/19 217 lb (98.4 kg)      Studies/Labs Reviewed:   EKG:  EKG is ordered today.  The ekg ordered today demonstrates ***  Recent Labs: 10/03/2019: ALT 23 10/04/2019: B Natriuretic Peptide 72.7; Magnesium 1.9 10/06/2019: BUN 20; Creatinine, Ser 1.20;  Hemoglobin 12.0; Platelets 325; Potassium 4.2; Sodium 137   Lipid Panel No results found for: CHOL, TRIG, HDL, CHOLHDL, VLDL, LDLCALC, LDLDIRECT  Additional studies/ records that were reviewed today include:   Echocardiogram: 09/2019 1. Left ventricular ejection fraction, by estimation, is 40 to 45%. The  left ventricle has mildly decreased function. The left ventricle  demonstrates global hypokinesis. Left ventricular diastolic function could  not be evaluated.  2. Right ventricular systolic function is normal. The right ventricular  size  is normal.  3. The mitral valve is normal in structure. Mild mitral valve  regurgitation.  4. The aortic valve is tricuspid. Aortic valve regurgitation is mild.  Mild aortic valve sclerosis is present, with no evidence of aortic valve  stenosis.  5. The inferior vena cava is normal in size with greater than 50%  respiratory variability, suggesting right atrial pressure of 3 mmHg.   Cath 05/2012 Hemodynamic Findings: Ao:  134/67              LV: 134/14/17 RA:   5            RV: 38/6/9 PA:  39/14 (mean 25)      PCWP:  7 Fick Cardiac Output: 4.4 L/min Fick Cardiac Index: 2.0 L/min/m2 Central Aortic Saturation: 89% Pulmonary Artery Saturation: 59%   Angiographic Findings:  Left main: No obstructive disease noted.   Left Anterior Descending Artery: Large caliber vessel that courses to the apex. There are two small to moderate caliber diagonal branches. No obstructive disease noted.   Circumflex Artery: Moderate caliber dominant vessel with moderate caliber obtuse marginal branch and small caliber posterolateral branch. The OM branch has 20% stenosis. The posterolateral branch has a 20% stenosis.   Right Coronary Artery: Small to moderate caliber non-dominant vessel with no obstructive disease noted.   Left Ventricular Angiogram: Deferred.   Impression: 1. Mild non-obstructive CAD (known to have normal LV function by echo) 2.  Mild pulmonary HTN  Recommendations: Medical management of mild CAD. Would continue ASA and consider low dose statin.        Complications:  None; patient tolerated the procedure well.                  ASSESSMENT & PLAN:    1. ***    Medication Adjustments/Labs and Tests Ordered: Current medicines are reviewed at length with the patient today.  Concerns regarding medicines are outlined above.  Medication changes, Labs and Tests ordered today are listed in the Patient Instructions below. There are no Patient Instructions on file for this visit.   Jarrett Soho, [place]  11/02/2019 3:27 PM    [facility] [address], [place], [place]  [address] Phone: [phone]; Fax: [phone]

## 2019-11-03 ENCOUNTER — Ambulatory Visit: Payer: Medicare HMO | Admitting: Physician Assistant

## 2019-11-05 ENCOUNTER — Encounter: Payer: Self-pay | Admitting: Family

## 2019-11-05 DIAGNOSIS — Z89412 Acquired absence of left great toe: Secondary | ICD-10-CM | POA: Insufficient documentation

## 2019-11-05 NOTE — Progress Notes (Signed)
Post-Op Visit Note   Patient: Peter James.           Date of Birth: 1925/05/26           MRN: [id number] Visit Date: 10/31/2019 PCP: [name], MD  Chief Complaint:  Chief Complaint  Patient presents with  . Left Foot - Routine Post Op    10/01/19 left GT amputation     HPI:  HPI Patient is a 86 year old gentleman seen today status post left great toe amputation on July 14.  The incision is well-healed he has been nonweightbearing in a wheelchair using a postop shoe.  Wonders when he can advance his weightbearing. Ortho Exam Incision well-healed sutures harvested today without incident.  He will continue with dry dressings for a few more days may advance his weightbearing in regular shoewear.  Discussed possibility of custom orthotics with a spacer he will see how he can get around in regular shoewear first  Visit Diagnoses:  1. Osteomyelitis of great toe of left foot ([place])   2. Left great toe amputee ([place])     Plan: As above discussed shoewear and spacer.  He will advance his activities may begin weightbearing as he tolerates he will follow-up once more in 2 weeks  Follow-Up Instructions: Return in about 2 weeks (around 11/14/2019).   Imaging: No results found.  Orders:  No orders of the defined types were placed in this encounter.  No orders of the defined types were placed in this encounter.    PMFS History: Patient Active Problem List   Diagnosis Date Noted  . Left great toe amputee ([place]) 11/05/2019  . Osteomyelitis of great toe of left foot ([place]) 09/30/2019  . CKD (chronic kidney disease), stage III 09/30/2019  . Type 2 diabetes mellitus ([place])   . CAD (coronary atherosclerotic disease)   . Hyperlipidemia associated with type 2 diabetes mellitus ([place])   . Exertional dyspnea 05/20/2012  . PAD (peripheral artery disease) ([place])   . Hypertension associated with diabetes ([place])   . Valvular heart disease    Past Medical History:  Diagnosis Date  . Acute  lower GI bleeding    Recurrent: 2011, 2012  . Bladder cancer ([place])    Transitional cell, 2001  . CAD (coronary atherosclerotic disease)    Nonobstructive, 05/2012  . Exertional dyspnea    EF 16-38%, grade 1 diastolic dysfunction, 06/6657  . HLD (hyperlipidemia)   . HTN (hypertension)   . Neuropathy   . Other insomnia   . PAD (peripheral artery disease) ([place])    ABIs, 2009: 0.89 right; 0.99 left  . Type 2 diabetes mellitus ([place])   . Valvular heart disease   . Vertigo     Family History  Problem Relation Age of Onset  . Heart attack Mother   . Heart attack Father   . Heart attack Sister   . Heart attack Sister   . Heart attack Sister   . Heart attack Sister     Past Surgical History:  Procedure Laterality Date  . AMPUTATION Left 10/01/2019   Procedure: LEFT GREAT TOE AMPUTATION AT METATARSOPHALANGEAL JOINT;  Surgeon: [name], MD;  Location: [facility];  Service: Orthopedics;  Laterality: Left;  . CATARACT EXTRACTION     bi lateral  . COLONOSCOPY    . HEMORRHOID SURGERY    . INGUINAL HERNIA REPAIR     Social History   Occupational History  . Not on file  Tobacco Use  . Smoking status:  Former Smoker    Quit date: 04/19/1972    Years since quitting: 47.5  . Smokeless tobacco: Never Used  Substance and Sexual Activity  . Alcohol use: Never  . Drug use: Never  . Sexual activity: Not on file

## 2019-11-18 DIAGNOSIS — Z299 Encounter for prophylactic measures, unspecified: Secondary | ICD-10-CM | POA: Diagnosis not present

## 2019-11-18 DIAGNOSIS — I10 Essential (primary) hypertension: Secondary | ICD-10-CM | POA: Diagnosis not present

## 2019-11-18 DIAGNOSIS — J449 Chronic obstructive pulmonary disease, unspecified: Secondary | ICD-10-CM | POA: Diagnosis not present

## 2019-11-18 DIAGNOSIS — M25551 Pain in right hip: Secondary | ICD-10-CM | POA: Diagnosis not present

## 2019-11-18 DIAGNOSIS — E1165 Type 2 diabetes mellitus with hyperglycemia: Secondary | ICD-10-CM | POA: Diagnosis not present

## 2019-11-18 DIAGNOSIS — I739 Peripheral vascular disease, unspecified: Secondary | ICD-10-CM | POA: Diagnosis not present

## 2019-12-25 ENCOUNTER — Telehealth: Payer: Self-pay

## 2019-12-25 NOTE — Telephone Encounter (Signed)
Ria Clock with the [facility] clinic wanted a copy of pt's CT scan and last office note faxed to [phone]  [facility] # [phone] Ext [phone]

## 2019-12-26 NOTE — Telephone Encounter (Signed)
8/13 OV NOTE FAXED

## 2020-01-05 ENCOUNTER — Other Ambulatory Visit: Payer: Self-pay

## 2020-01-05 DIAGNOSIS — I739 Peripheral vascular disease, unspecified: Secondary | ICD-10-CM

## 2020-01-05 DIAGNOSIS — G40909 Epilepsy, unspecified, not intractable, without status epilepticus: Secondary | ICD-10-CM | POA: Insufficient documentation

## 2020-01-09 ENCOUNTER — Other Ambulatory Visit: Payer: Self-pay | Admitting: *Deleted

## 2020-01-09 ENCOUNTER — Other Ambulatory Visit: Payer: Self-pay

## 2020-01-09 ENCOUNTER — Ambulatory Visit (INDEPENDENT_AMBULATORY_CARE_PROVIDER_SITE_OTHER): Payer: Medicare Other | Admitting: Vascular Surgery

## 2020-01-09 ENCOUNTER — Encounter: Payer: Self-pay | Admitting: Vascular Surgery

## 2020-01-09 ENCOUNTER — Ambulatory Visit (HOSPITAL_COMMUNITY)
Admission: RE | Admit: 2020-01-09 | Discharge: 2020-01-09 | Disposition: A | Discharge status: Home / Self Care | Payer: Medicare Other | Source: Ambulatory Visit | Attending: Vascular Surgery | Admitting: Vascular Surgery

## 2020-01-09 ENCOUNTER — Encounter: Payer: Self-pay | Admitting: *Deleted

## 2020-01-09 VITALS — BP 142/83 | HR 83 | Temp 97.7°F | Resp 20 | Ht 72.0 in | Wt 209.0 lb

## 2020-01-09 DIAGNOSIS — I739 Peripheral vascular disease, unspecified: Secondary | ICD-10-CM | POA: Insufficient documentation

## 2020-01-09 NOTE — H&P (View-Only) (Signed)
Patient ID: Peter Furlong., male   DOB: Feb 10, 1926, 86 y.o.   MRN: [id number]  Reason for Consult: New Patient (Initial Visit)   Referred by Peter Chroman, MD  Subjective:     HPI:  Peter James. is a 86 y.o. male Peter James with history of hypertension, diabetes, hyperlipidemia and nonobstructive coronary artery disease.  He does have a history of a left great toe amputation.  Now has wounds on the right medial foot at the first metatarsal head and also has a wound on the second toe on the left.  He denies any history of vascular intervention.  He is a former smoker quit many years ago.  He does walk currently wearing a postop shoe on the right due to the wound.  He is followed by the Peter James for his wounds.  He walks with help of a cane.  He does not have any rest pain or claudication symptoms.  Past Medical History:  Diagnosis Date  . Acute lower GI bleeding    Recurrent: 2011, 2012  . Bladder cancer ([place])    Transitional cell, 2001  . CAD (coronary atherosclerotic disease)    Nonobstructive, 05/2012  . Exertional dyspnea    EF 40-98%, grade 1 diastolic dysfunction, 03/1912  . HLD (hyperlipidemia)   . HTN (hypertension)   . Neuropathy   . Other insomnia   . PAD (peripheral artery disease) ([place])    ABIs, 2009: 0.89 right; 0.99 left  . Type 2 diabetes mellitus ([place])   . Valvular heart disease   . Vertigo    Family History  Problem Relation Age of Onset  . Heart attack Mother   . Heart attack Father   . Heart attack Sister   . Heart attack Sister   . Heart attack Sister   . Heart attack Sister    Past Surgical History:  Procedure Laterality Date  . AMPUTATION Left 10/01/2019   Procedure: LEFT GREAT TOE AMPUTATION AT METATARSOPHALANGEAL JOINT;  Surgeon: [name], MD;  Location: [facility];  Service: Orthopedics;  Laterality: Left;  . CATARACT EXTRACTION     bi lateral  . COLONOSCOPY    . HEMORRHOID SURGERY    . INGUINAL HERNIA REPAIR      Short Social  History:  Social History   Tobacco Use  . Smoking status: Former Smoker    Quit date: 04/19/1972    Years since quitting: 47.7  . Smokeless tobacco: Never Used  Substance Use Topics  . Alcohol use: Never    No Known Allergies  Current Outpatient Medications  Medication Sig Dispense Refill  . albuterol (VENTOLIN HFA) 108 (90 Base) MCG/ACT inhaler Inhale into the lungs every 6 (six) hours as needed for wheezing or shortness of breath.    . allopurinol (ZYLOPRIM) 300 MG tablet Take 300 mg by mouth daily.     . Alogliptin Benzoate 12.5 MG TABS Take 6.25 mg by mouth daily.    [name] amLODipine (NORVASC) 10 MG tablet Take 1 tablet (10 mg total) by mouth daily. 90 tablet 3  . aspirin EC 81 MG tablet Take 81 mg by mouth daily. Swallow whole.    [name] atorvastatin (LIPITOR) 10 MG tablet Take 1 tablet (10 mg total) by mouth every evening. 30 tablet 6  . cholecalciferol (VITAMIN D3) 25 MCG (1000 UNIT) tablet Take 1,000 Units by mouth daily.    . clotrimazole (LOTRIMIN) 1 % cream Apply 1 application topically 2 (two) times daily.    [name]  nortriptyline (PAMELOR) 10 MG capsule Take 20 mg by mouth at bedtime.     . polyethylene glycol (MIRALAX / GLYCOLAX) 17 g packet Take 17 g by mouth daily as needed for mild constipation.    . sildenafil (VIAGRA) 100 MG tablet Take 100 mg by mouth daily as needed for erectile dysfunction.    . tamsulosin (FLOMAX) 0.4 MG CAPS capsule Take 0.4 mg by mouth daily.     . carvedilol (COREG) 3.125 MG tablet Take 1 tablet (3.125 mg total) by mouth 2 (two) times daily with a meal. 60 tablet 0   No current facility-administered medications for this visit.    Review of Systems  Constitutional:  Constitutional negative. HENT: HENT negative.  Eyes: Eyes negative.  Respiratory: Respiratory negative.  Cardiovascular: Cardiovascular negative.  GI: Gastrointestinal negative.  Musculoskeletal: Musculoskeletal negative.  Skin: Positive for wound.  Neurological: Neurological  negative. Hematologic: Hematologic/lymphatic negative.  Psychiatric: Psychiatric negative.        Objective:  Objective   Vitals:   01/09/20 1039  BP: (!) 142/83  Pulse: 83  Resp: 20  Temp: 97.7 F (36.5 C)  SpO2: 96%  Weight: 209 lb (94.8 kg)  Height: 6' (1.829 m)   Body mass index is 28.35 kg/m.  Physical Exam Constitutional:      Appearance: Normal appearance.  HENT:     Head: Normocephalic.     Nose:     Comments: Wearing a mask Eyes:     Pupils: Pupils are equal, round, and reactive to light.  Neck:     Vascular: No carotid bruit.  Cardiovascular:     Rate and Rhythm: Regular rhythm.     Pulses:          Femoral pulses are 2+ on the right side and 2+ on the left side.      Popliteal pulses are 0 on the right side and 0 on the left side.  Pulmonary:     Effort: Pulmonary effort is normal.  Abdominal:     General: Abdomen is flat.     Palpations: Abdomen is soft. There is no mass.  Musculoskeletal:        General: Normal range of motion.  Skin:    Comments: Right first metatarsal head 1 cm wound Left half centimeter wound second toe distally in the first toe has been amputated  Neurological:     Mental Status: He is alert.  Psychiatric:        Mood and Affect: Mood normal.        Behavior: Behavior normal.        Thought Content: Thought content normal.        Judgment: Judgment normal.     Data: +-------+-----------+-----------+------------+------------+  ABI/TBIToday's ABIToday's TBIPrevious ABIPrevious TBI  +-------+-----------+-----------+------------+------------+  Right 0.57    0.48    0.7     0.58      +-------+-----------+-----------+------------+------------+  Left  0.71    amp    0.77    wound      +-------+-----------+-----------+------------+------------+         Assessment/Plan:     86 year old male with bilateral foot wounds right appears somewhat worse than the left as the left  is only on the tip of the second toe.  Right ABI is lower.  Appears to have both SFA and probable tibial disease from pulse exam and decreased ABIs.  We will begin with angiography from the left common femoral approach.  I have asked him to continue his aspirin  and statin.  We discussed the specifics of the procedure as well as the risk and benefits he demonstrates good understanding we will get him set up on a Monday in the very near future.     Waynetta Sandy MD [facility]

## 2020-01-09 NOTE — Progress Notes (Signed)
Patient ID: Peter James., male   DOB: [birth year], 86 y.o.   MRN: [id number]  Reason for Consult: New Patient (Initial Visit)   Referred by [name], MD  Subjective:     HPI:  Peter James. is a 86 y.o. male [place] with history of hypertension, diabetes, hyperlipidemia and nonobstructive coronary artery disease.  He does have a history of a left great toe amputation.  Now has wounds on the right medial foot at the first metatarsal head and also has a wound on the second toe on the left.  He denies any history of vascular intervention.  He is a former smoker quit many years ago.  He does walk currently wearing a postop shoe on the right due to the wound.  [facility] for his wounds.  He walks with help of a cane.  He does not have any rest pain or claudication symptoms.  Past Medical History:  Diagnosis Date  . Acute lower GI bleeding    Recurrent: 2011, 2012  . Bladder cancer ([place])    Transitional cell, 2001  . CAD (coronary atherosclerotic disease)    Nonobstructive, 05/2012  . Exertional dyspnea    EF 11-94%, grade 1 diastolic dysfunction, 03/7406  . HLD (hyperlipidemia)   . HTN (hypertension)   . Neuropathy   . Other insomnia   . PAD (peripheral artery disease) ([place])    ABIs, 2009: 0.89 right; 0.99 left  . Type 2 diabetes mellitus ([place])   . Valvular heart disease   . Vertigo    Family History  Problem Relation Age of Onset  . Heart attack Mother   . Heart attack Father   . Heart attack Sister   . Heart attack Sister   . Heart attack Sister   . Heart attack Sister    Past Surgical History:  Procedure Laterality Date  . AMPUTATION Left 10/01/2019   Procedure: LEFT GREAT TOE AMPUTATION AT METATARSOPHALANGEAL JOINT;  Surgeon: [name], MD;  Location: [place];  Service: Orthopedics;  Laterality: Left;  . CATARACT EXTRACTION     bi lateral  . COLONOSCOPY    . HEMORRHOID SURGERY    . INGUINAL HERNIA REPAIR      Short Social  History:  Social History   Tobacco Use  . Smoking status: Former Smoker    Quit date: 04/19/1972    Years since quitting: 47.7  . Smokeless tobacco: Never Used  Substance Use Topics  . Alcohol use: Never    No Known Allergies  Current Outpatient Medications  Medication Sig Dispense Refill  . albuterol (VENTOLIN HFA) 108 (90 Base) MCG/ACT inhaler Inhale into the lungs every 6 (six) hours as needed for wheezing or shortness of breath.    . allopurinol (ZYLOPRIM) 300 MG tablet Take 300 mg by mouth daily.     . Alogliptin Benzoate 12.5 MG TABS Take 6.25 mg by mouth daily.    [name] amLODipine (NORVASC) 10 MG tablet Take 1 tablet (10 mg total) by mouth daily. 90 tablet 3  . aspirin EC 81 MG tablet Take 81 mg by mouth daily. Swallow whole.    [name] atorvastatin (LIPITOR) 10 MG tablet Take 1 tablet (10 mg total) by mouth every evening. 30 tablet 6  . cholecalciferol (VITAMIN D3) 25 MCG (1000 UNIT) tablet Take 1,000 Units by mouth daily.    . clotrimazole (LOTRIMIN) 1 % cream Apply 1 application topically 2 (two) times daily.    [name]  nortriptyline (PAMELOR) 10 MG capsule Take 20 mg by mouth at bedtime.     . polyethylene glycol (MIRALAX / GLYCOLAX) 17 g packet Take 17 g by mouth daily as needed for mild constipation.    . sildenafil (VIAGRA) 100 MG tablet Take 100 mg by mouth daily as needed for erectile dysfunction.    . tamsulosin (FLOMAX) 0.4 MG CAPS capsule Take 0.4 mg by mouth daily.     . carvedilol (COREG) 3.125 MG tablet Take 1 tablet (3.125 mg total) by mouth 2 (two) times daily with a meal. 60 tablet 0   No current facility-administered medications for this visit.    Review of Systems  Constitutional:  Constitutional negative. HENT: HENT negative.  Eyes: Eyes negative.  Respiratory: Respiratory negative.  Cardiovascular: Cardiovascular negative.  GI: Gastrointestinal negative.  Musculoskeletal: Musculoskeletal negative.  Skin: Positive for wound.  Neurological: Neurological  negative. Hematologic: Hematologic/lymphatic negative.  Psychiatric: Psychiatric negative.        Objective:  Objective   Vitals:   01/09/20 1039  BP: (!) 142/83  Pulse: 83  Resp: 20  Temp: 97.7 F (36.5 C)  SpO2: 96%  Weight: 209 lb (94.8 kg)  Height: 6' (1.829 m)   Body mass index is 28.35 kg/m.  Physical Exam Constitutional:      Appearance: Normal appearance.  HENT:     Head: Normocephalic.     Nose:     Comments: Wearing a mask Eyes:     Pupils: Pupils are equal, round, and reactive to light.  Neck:     Vascular: No carotid bruit.  Cardiovascular:     Rate and Rhythm: Regular rhythm.     Pulses:          Femoral pulses are 2+ on the right side and 2+ on the left side.      Popliteal pulses are 0 on the right side and 0 on the left side.  Pulmonary:     Effort: Pulmonary effort is normal.  Abdominal:     General: Abdomen is flat.     Palpations: Abdomen is soft. There is no mass.  Musculoskeletal:        General: Normal range of motion.  Skin:    Comments: Right first metatarsal head 1 cm wound Left half centimeter wound second toe distally in the first toe has been amputated  Neurological:     Mental Status: He is alert.  Psychiatric:        Mood and Affect: Mood normal.        Behavior: Behavior normal.        Thought Content: Thought content normal.        Judgment: Judgment normal.     Data: +-------+-----------+-----------+------------+------------+  ABI/TBIToday's ABIToday's TBIPrevious ABIPrevious TBI  +-------+-----------+-----------+------------+------------+  Right 0.57    0.48    0.7     0.58      +-------+-----------+-----------+------------+------------+  Left  0.71    amp    0.77    wound      +-------+-----------+-----------+------------+------------+         Assessment/Plan:     86 year old male with bilateral foot wounds right appears somewhat worse than the left as the left  is only on the tip of the second toe.  Right ABI is lower.  Appears to have both SFA and probable tibial disease from pulse exam and decreased ABIs.  We will begin with angiography from the left common femoral approach.  I have asked him to continue his aspirin  and statin.  We discussed the specifics of the procedure as well as the risk and benefits he demonstrates good understanding we will get him set up on a Monday in the very near future.     Waynetta Sandy MD [facility]

## 2020-01-16 ENCOUNTER — Other Ambulatory Visit (HOSPITAL_COMMUNITY)
Admission: RE | Admit: 2020-01-16 | Discharge: 2020-01-16 | Disposition: A | Discharge status: Home / Self Care | Payer: Medicare Other | Source: Ambulatory Visit | Attending: Vascular Surgery | Admitting: Vascular Surgery

## 2020-01-16 DIAGNOSIS — Z01812 Encounter for preprocedural laboratory examination: Secondary | ICD-10-CM | POA: Insufficient documentation

## 2020-01-16 DIAGNOSIS — Z20822 Contact with and (suspected) exposure to covid-19: Secondary | ICD-10-CM | POA: Diagnosis not present

## 2020-01-17 LAB — SARS CORONAVIRUS 2 (TAT 6-24 HRS): SARS Coronavirus 2: NEGATIVE

## 2020-01-19 ENCOUNTER — Encounter (HOSPITAL_COMMUNITY)
Admission: RE | Disposition: A | Discharge status: Home / Self Care | Payer: Self-pay | Source: Home / Self Care | Attending: Vascular Surgery

## 2020-01-19 ENCOUNTER — Other Ambulatory Visit: Payer: Self-pay

## 2020-01-19 ENCOUNTER — Observation Stay (HOSPITAL_COMMUNITY)
Admission: RE | Admit: 2020-01-19 | Discharge: 2020-01-20 | Disposition: A | Discharge status: Home / Self Care | Payer: Medicare Other | Attending: Vascular Surgery | Admitting: Vascular Surgery

## 2020-01-19 DIAGNOSIS — L97529 Non-pressure chronic ulcer of other part of left foot with unspecified severity: Secondary | ICD-10-CM | POA: Insufficient documentation

## 2020-01-19 DIAGNOSIS — Z79899 Other long term (current) drug therapy: Secondary | ICD-10-CM | POA: Diagnosis not present

## 2020-01-19 DIAGNOSIS — I70235 Atherosclerosis of native arteries of right leg with ulceration of other part of foot: Secondary | ICD-10-CM | POA: Insufficient documentation

## 2020-01-19 DIAGNOSIS — E1142 Type 2 diabetes mellitus with diabetic polyneuropathy: Secondary | ICD-10-CM | POA: Insufficient documentation

## 2020-01-19 DIAGNOSIS — E1140 Type 2 diabetes mellitus with diabetic neuropathy, unspecified: Secondary | ICD-10-CM | POA: Insufficient documentation

## 2020-01-19 DIAGNOSIS — E11621 Type 2 diabetes mellitus with foot ulcer: Principal | ICD-10-CM | POA: Insufficient documentation

## 2020-01-19 DIAGNOSIS — Z87891 Personal history of nicotine dependence: Secondary | ICD-10-CM | POA: Diagnosis not present

## 2020-01-19 DIAGNOSIS — Z7982 Long term (current) use of aspirin: Secondary | ICD-10-CM | POA: Diagnosis not present

## 2020-01-19 DIAGNOSIS — E1122 Type 2 diabetes mellitus with diabetic chronic kidney disease: Secondary | ICD-10-CM | POA: Diagnosis not present

## 2020-01-19 DIAGNOSIS — Z8551 Personal history of malignant neoplasm of bladder: Secondary | ICD-10-CM | POA: Insufficient documentation

## 2020-01-19 DIAGNOSIS — N183 Chronic kidney disease, stage 3 unspecified: Secondary | ICD-10-CM | POA: Diagnosis not present

## 2020-01-19 DIAGNOSIS — I1310 Hypertensive heart and chronic kidney disease without heart failure, with stage 1 through stage 4 chronic kidney disease, or unspecified chronic kidney disease: Secondary | ICD-10-CM | POA: Insufficient documentation

## 2020-01-19 DIAGNOSIS — Z89412 Acquired absence of left great toe: Secondary | ICD-10-CM | POA: Diagnosis not present

## 2020-01-19 DIAGNOSIS — I2510 Atherosclerotic heart disease of native coronary artery without angina pectoris: Secondary | ICD-10-CM | POA: Insufficient documentation

## 2020-01-19 DIAGNOSIS — Z8249 Family history of ischemic heart disease and other diseases of the circulatory system: Secondary | ICD-10-CM | POA: Diagnosis not present

## 2020-01-19 DIAGNOSIS — I739 Peripheral vascular disease, unspecified: Secondary | ICD-10-CM | POA: Diagnosis present

## 2020-01-19 DIAGNOSIS — I70245 Atherosclerosis of native arteries of left leg with ulceration of other part of foot: Secondary | ICD-10-CM | POA: Insufficient documentation

## 2020-01-19 DIAGNOSIS — I152 Hypertension secondary to endocrine disorders: Secondary | ICD-10-CM | POA: Diagnosis present

## 2020-01-19 DIAGNOSIS — E7849 Other hyperlipidemia: Secondary | ICD-10-CM | POA: Insufficient documentation

## 2020-01-19 DIAGNOSIS — E1159 Type 2 diabetes mellitus with other circulatory complications: Secondary | ICD-10-CM | POA: Diagnosis present

## 2020-01-19 DIAGNOSIS — L97519 Non-pressure chronic ulcer of other part of right foot with unspecified severity: Secondary | ICD-10-CM | POA: Insufficient documentation

## 2020-01-19 DIAGNOSIS — R001 Bradycardia, unspecified: Secondary | ICD-10-CM | POA: Diagnosis present

## 2020-01-19 HISTORY — PX: ABDOMINAL AORTOGRAM W/LOWER EXTREMITY: CATH118223

## 2020-01-19 HISTORY — PX: PERIPHERAL VASCULAR INTERVENTION: CATH118257

## 2020-01-19 LAB — BASIC METABOLIC PANEL
Anion gap: 9 (ref 5–15)
BUN: 33 mg/dL — ABNORMAL HIGH (ref 8–23)
CO2: 25 mmol/L (ref 22–32)
Calcium: 9.5 mg/dL (ref 8.9–10.3)
Chloride: 104 mmol/L (ref 98–111)
Creatinine, Ser: 1.95 mg/dL — ABNORMAL HIGH (ref 0.61–1.24)
GFR, Estimated: 32 mL/min — ABNORMAL LOW (ref >60)
Glucose, Bld: 109 mg/dL — ABNORMAL HIGH (ref 70–99)
Potassium: 4.6 mmol/L (ref 3.5–5.1)
Sodium: 138 mmol/L (ref 135–145)

## 2020-01-19 LAB — GLUCOSE, CAPILLARY: Glucose-Capillary: 79 mg/dL (ref 70–99)

## 2020-01-19 SURGERY — ABDOMINAL AORTOGRAM W/LOWER EXTREMITY
Anesthesia: LOCAL | Laterality: Right

## 2020-01-19 MED ORDER — MELATONIN 5 MG PO TABS
5.0000 mg | ORAL_TABLET | Freq: Once | ORAL | Status: AC
  Administered 2020-01-19: 5 mg via ORAL
  Filled 2020-01-19: qty 1

## 2020-01-19 MED ORDER — CLOPIDOGREL BISULFATE 300 MG PO TABS
ORAL_TABLET | ORAL | Status: DC | PRN
  Administered 2020-01-19: 300 mg via ORAL

## 2020-01-19 MED ORDER — DOCUSATE SODIUM 100 MG PO CAPS
100.0000 mg | ORAL_CAPSULE | Freq: Two times a day (BID) | ORAL | Status: DC
  Administered 2020-01-20: 100 mg via ORAL
  Filled 2020-01-19: qty 1

## 2020-01-19 MED ORDER — POTASSIUM CHLORIDE CRYS ER 20 MEQ PO TBCR
20.0000 meq | EXTENDED_RELEASE_TABLET | Freq: Once | ORAL | Status: DC
  Filled 2020-01-19: qty 2

## 2020-01-19 MED ORDER — IODIXANOL 320 MG/ML IV SOLN
INTRAVENOUS | Status: DC | PRN
  Administered 2020-01-19: 115 mL via INTRA_ARTERIAL

## 2020-01-19 MED ORDER — LABETALOL HCL 5 MG/ML IV SOLN: 10.0000 mg | INTRAVENOUS | Status: DC | PRN

## 2020-01-19 MED ORDER — OXYCODONE-ACETAMINOPHEN 5-325 MG PO TABS
1.0000 | ORAL_TABLET | ORAL | Status: DC | PRN
  Administered 2020-01-20: 1 via ORAL
  Filled 2020-01-19: qty 1

## 2020-01-19 MED ORDER — HEPARIN SODIUM (PORCINE) 1000 UNIT/ML IJ SOLN
INTRAMUSCULAR | Status: AC
  Filled 2020-01-19: qty 1

## 2020-01-19 MED ORDER — METOPROLOL TARTRATE 5 MG/5ML IV SOLN: 2.0000 mg | INTRAVENOUS | Status: DC | PRN

## 2020-01-19 MED ORDER — SODIUM CHLORIDE 0.9 % IV SOLN: INTRAVENOUS | Status: DC

## 2020-01-19 MED ORDER — ATORVASTATIN CALCIUM 10 MG PO TABS
10.0000 mg | ORAL_TABLET | Freq: Every evening | ORAL | Status: DC
  Administered 2020-01-19: 10 mg via ORAL
  Filled 2020-01-19: qty 1

## 2020-01-19 MED ORDER — CLOPIDOGREL BISULFATE 300 MG PO TABS
ORAL_TABLET | ORAL | Status: AC
  Filled 2020-01-19: qty 1

## 2020-01-19 MED ORDER — HEPARIN (PORCINE) IN NACL 1000-0.9 UT/500ML-% IV SOLN
INTRAVENOUS | Status: DC | PRN
  Administered 2020-01-19 (×2): 500 mL

## 2020-01-19 MED ORDER — CLOPIDOGREL BISULFATE 75 MG PO TABS: 300.0000 mg | ORAL_TABLET | Freq: Once | ORAL | Status: DC

## 2020-01-19 MED ORDER — POLYETHYLENE GLYCOL 3350 17 G PO PACK: 17.0000 g | PACK | Freq: Every day | ORAL | Status: DC | PRN

## 2020-01-19 MED ORDER — OXYCODONE HCL 5 MG PO TABS: 5.0000 mg | ORAL_TABLET | ORAL | Status: DC | PRN

## 2020-01-19 MED ORDER — CLOPIDOGREL BISULFATE 75 MG PO TABS: 75.0000 mg | ORAL_TABLET | Freq: Every day | ORAL | 3 refills | Status: AC

## 2020-01-19 MED ORDER — SODIUM CHLORIDE 0.9% FLUSH
3.0000 mL | Freq: Two times a day (BID) | INTRAVENOUS | Status: DC
  Administered 2020-01-19: 3 mL via INTRAVENOUS

## 2020-01-19 MED ORDER — LINAGLIPTIN 5 MG PO TABS
5.0000 mg | ORAL_TABLET | Freq: Every day | ORAL | Status: DC
  Administered 2020-01-20: 5 mg via ORAL
  Filled 2020-01-19: qty 1

## 2020-01-19 MED ORDER — TAMSULOSIN HCL 0.4 MG PO CAPS
0.4000 mg | ORAL_CAPSULE | Freq: Every day | ORAL | Status: DC
  Administered 2020-01-19 – 2020-01-20 (×2): 0.4 mg via ORAL
  Filled 2020-01-19 (×2): qty 1

## 2020-01-19 MED ORDER — VITAMIN D 25 MCG (1000 UNIT) PO TABS
1000.0000 [IU] | ORAL_TABLET | Freq: Every day | ORAL | Status: DC
  Administered 2020-01-19 – 2020-01-20 (×2): 1000 [IU] via ORAL
  Filled 2020-01-19 (×2): qty 1

## 2020-01-19 MED ORDER — HYDRALAZINE HCL 20 MG/ML IJ SOLN: 5.0000 mg | INTRAMUSCULAR | Status: DC | PRN

## 2020-01-19 MED ORDER — ALUM & MAG HYDROXIDE-SIMETH 200-200-20 MG/5ML PO SUSP: 15.0000 mL | ORAL | Status: DC | PRN

## 2020-01-19 MED ORDER — SODIUM CHLORIDE 0.9 % IV SOLN: INTRAVENOUS | Status: AC

## 2020-01-19 MED ORDER — NORTRIPTYLINE HCL 10 MG PO CAPS
10.0000 mg | ORAL_CAPSULE | Freq: Every day | ORAL | Status: DC
  Administered 2020-01-19: 10 mg via ORAL
  Filled 2020-01-19 (×3): qty 1

## 2020-01-19 MED ORDER — PHENOL 1.4 % MT LIQD: 1.0000 | OROMUCOSAL | Status: DC | PRN

## 2020-01-19 MED ORDER — ACETAMINOPHEN 325 MG PO TABS: 325.0000 mg | ORAL_TABLET | ORAL | Status: DC | PRN

## 2020-01-19 MED ORDER — CARVEDILOL 3.125 MG PO TABS
3.1250 mg | ORAL_TABLET | Freq: Every day | ORAL | Status: DC
  Administered 2020-01-20: 3.125 mg via ORAL
  Filled 2020-01-19: qty 1

## 2020-01-19 MED ORDER — ALOGLIPTIN BENZOATE 12.5 MG PO TABS: 6.2500 mg | ORAL_TABLET | Freq: Every day | ORAL | Status: DC

## 2020-01-19 MED ORDER — HEPARIN (PORCINE) IN NACL 1000-0.9 UT/500ML-% IV SOLN
INTRAVENOUS | Status: AC
  Filled 2020-01-19: qty 1000

## 2020-01-19 MED ORDER — LIDOCAINE HCL (PF) 1 % IJ SOLN
INTRAMUSCULAR | Status: DC | PRN
  Administered 2020-01-19: 15 mL via INTRADERMAL
  Administered 2020-01-19: 3 mL via INTRADERMAL

## 2020-01-19 MED ORDER — MORPHINE SULFATE (PF) 4 MG/ML IV SOLN: 2.0000 mg | INTRAVENOUS | Status: DC | PRN

## 2020-01-19 MED ORDER — GUAIFENESIN-DM 100-10 MG/5ML PO SYRP: 15.0000 mL | ORAL_SOLUTION | ORAL | Status: DC | PRN

## 2020-01-19 MED ORDER — ASPIRIN EC 81 MG PO TBEC
81.0000 mg | DELAYED_RELEASE_TABLET | Freq: Every day | ORAL | Status: DC
  Administered 2020-01-20: 81 mg via ORAL
  Filled 2020-01-19: qty 1

## 2020-01-19 MED ORDER — SODIUM CHLORIDE 0.9% FLUSH: 3.0000 mL | INTRAVENOUS | Status: DC | PRN

## 2020-01-19 MED ORDER — PANTOPRAZOLE SODIUM 40 MG PO TBEC
40.0000 mg | DELAYED_RELEASE_TABLET | Freq: Every day | ORAL | Status: DC
  Administered 2020-01-19 – 2020-01-20 (×2): 40 mg via ORAL
  Filled 2020-01-19 (×2): qty 1

## 2020-01-19 MED ORDER — HEPARIN SODIUM (PORCINE) 1000 UNIT/ML IJ SOLN
INTRAMUSCULAR | Status: DC | PRN
  Administered 2020-01-19: 10000 [IU] via INTRAVENOUS
  Administered 2020-01-19 (×2): 5000 [IU] via INTRAVENOUS

## 2020-01-19 MED ORDER — AMLODIPINE BESYLATE 10 MG PO TABS
10.0000 mg | ORAL_TABLET | Freq: Every day | ORAL | Status: DC
  Administered 2020-01-19 – 2020-01-20 (×2): 10 mg via ORAL
  Filled 2020-01-19 (×2): qty 1

## 2020-01-19 MED ORDER — ACETAMINOPHEN 325 MG PO TABS: 650.0000 mg | ORAL_TABLET | ORAL | Status: DC | PRN

## 2020-01-19 MED ORDER — SODIUM CHLORIDE 0.9 % IV SOLN: 250.0000 mL | INTRAVENOUS | Status: DC | PRN

## 2020-01-19 MED ORDER — CLOPIDOGREL BISULFATE 75 MG PO TABS
75.0000 mg | ORAL_TABLET | Freq: Every day | ORAL | Status: DC
  Administered 2020-01-20: 75 mg via ORAL
  Filled 2020-01-19: qty 1

## 2020-01-19 MED ORDER — ONDANSETRON HCL 4 MG/2ML IJ SOLN: 4.0000 mg | Freq: Four times a day (QID) | INTRAMUSCULAR | Status: DC | PRN

## 2020-01-19 MED ORDER — ACETAMINOPHEN 325 MG RE SUPP
325.0000 mg | RECTAL | Status: DC | PRN
  Filled 2020-01-19: qty 2

## 2020-01-19 MED ORDER — ALLOPURINOL 300 MG PO TABS
300.0000 mg | ORAL_TABLET | Freq: Every day | ORAL | Status: DC
  Administered 2020-01-19 – 2020-01-20 (×2): 300 mg via ORAL
  Filled 2020-01-19 (×3): qty 1

## 2020-01-19 MED ORDER — LIDOCAINE HCL (PF) 1 % IJ SOLN
INTRAMUSCULAR | Status: AC
  Filled 2020-01-19: qty 30

## 2020-01-19 SURGICAL SUPPLY — 39 items
BALL STERLING OTW 2.5X150X150 (BALLOONS) ×2
BALLN COYOTE OTW 3X220X150 (BALLOONS) ×3
BALLN COYOTE OTW 4X80X150 (BALLOONS) ×3
BALLN MUSTANG 4X100X135 (BALLOONS) ×3
BALLN STERLING OTW 2.5X150X150 (BALLOONS) ×2
BALLOON COYOTE OTW 3X220X150 (BALLOONS) ×2 IMPLANT
BALLOON COYOTE OTW 4X80X150 (BALLOONS) ×2 IMPLANT
BALLOON MUSTANG 4X100X135 (BALLOONS) ×2 IMPLANT
BALLOON STRLNG OTW 2.5X150X150 (BALLOONS) ×2 IMPLANT
CATH AURYON 4FR ATHEREC 0.9 (CATHETERS) ×3 IMPLANT
CATH CXI 2.6F 90 ANG (CATHETERS) ×3
CATH CXI SUPP 2.6F 150 ANG (CATHETERS) ×3 IMPLANT
CATH OMNI FLUSH 5F 65CM (CATHETERS) ×3 IMPLANT
CATH QUICKCROSS .018X135CM (MICROCATHETER) ×3 IMPLANT
CATH QUICKCROSS ANG SELECT (CATHETERS) ×3 IMPLANT
CATH SPRT ANG 90X2.3FR ACPT (CATHETERS) ×2 IMPLANT
CATH STRAIGHT 5FR 65CM (CATHETERS) ×3 IMPLANT
CATH TEMPO AQUA 5F 100CM (CATHETERS) ×3 IMPLANT
CLOSURE MYNX CONTROL 6F/7F (Vascular Products) ×3 IMPLANT
DEVICE ONE SNARE 10MM (MISCELLANEOUS) ×3 IMPLANT
FILTER CO2 0.2 MICRON (VASCULAR PRODUCTS) ×3 IMPLANT
GLIDEWIRE ADV .035X260CM (WIRE) ×3 IMPLANT
GLIDEWIRE ANGLED NITR .018X260 (WIRE) ×3 IMPLANT
KIT ENCORE 26 ADVANTAGE (KITS) ×3 IMPLANT
KIT MICROPUNCTURE NIT STIFF (SHEATH) ×3 IMPLANT
KIT PV (KITS) ×3 IMPLANT
RESERVOIR CO2 (VASCULAR PRODUCTS) ×3 IMPLANT
SET FLUSH CO2 (MISCELLANEOUS) ×3 IMPLANT
SHEATH MICROPUNCTURE PEDAL 5FR (SHEATH) ×3 IMPLANT
SHEATH PINNACLE 5F 10CM (SHEATH) ×3 IMPLANT
SHEATH PINNACLE 6F 10CM (SHEATH) ×3 IMPLANT
SHEATH PINNACLE ST 6F 65CM (SHEATH) ×3 IMPLANT
STENT TIGRIS 5X100X120 (Permanent Stent) ×3 IMPLANT
SYR MEDRAD MARK V 150ML (SYRINGE) ×3 IMPLANT
TRANSDUCER W/STOPCOCK (MISCELLANEOUS) ×3 IMPLANT
TRAY PV CATH (CUSTOM PROCEDURE TRAY) ×3 IMPLANT
WIRE BENTSON .035X145CM (WIRE) ×3 IMPLANT
WIRE G V18X300CM (WIRE) ×15 IMPLANT
WIRE SPARTACORE .014X300CM (WIRE) ×3 IMPLANT

## 2020-01-19 NOTE — Op Note (Signed)
Patient name: Peter James. MRN: [id number] DOB: May 31, 1925 Sex: male  01/19/2020 Pre-operative Diagnosis: critical bilateral lower extremity ischemia with wounds Post-operative diagnosis:  Same Surgeon:  Erlene Quan C. Donzetta Matters, MD Procedure Performed: 1.  Ultrasound-guided cannulation left common femoral artery 2.  CO2 aortogram and limited bilateral lower extremity angiography 3.  Selection of right popliteal artery and right tibial artery angiography with contrast 4.  Ultrasound-guided cannulation right anterior tibial artery 5.  Stent of right popliteal artery with 5 x 100 mm [place] 6.  Laser atherectomy of right popliteal artery and anterior tibial artery with 0.9 [place] 7.  Plain balloon angioplasty right anterior tibial artery with 3 mm and 2.5 mm balloons 8.  Minx device closure left common femoral artery   Indications: 86 year old male with bilateral lower extremity ulceration.  He has previous left great toe amputation which is healed.  He has a left second toe ulcer and he has a right first metatarsal ulceration.  He is now indicated for angiography possible intervention.  Findings: His aorta and iliac segments are free of flow-limiting stenosis via CO2.  Bilateral common femoral SFAs are free of flow-limiting stenosis.  We did not evaluate the left tibial arteries given his renal insufficiency.  On the right side he had TP trunk appeared to be peroneal artery which occludes in the mid calf.  Anterior tibial artery also occludes in the mid calf reconstitutes in the dorsalis pedis is on the foot.  After we were able to access retrograde we then performed laser arthrectomy and then primary balloon angioplasty followed by stenting of the popliteal artery.  We did have a dissection into the anterior tibial artery we had to rewire this antegrade balloon with 2.5 and 3 mm balloons.  At completion we had a nonflow limiting dissection at the takeoff of the intertibial artery we had very strong  signals distally at the anterior tibial artery and onto the dorsalis pedis artery.  Patient will need left lower extremity angiography for left second toe ulceration.   Procedure:  The patient was identified in the holding area and taken to room 8.  The patient was then placed supine on the table and prepped and draped in the usual sterile fashion.  A time out was called.  Ultrasound was used to evaluate the left common femoral artery.  This was noted be patent and compressible.  The area was anesthetized 1% lidocaine cannulated with micropuncture needle followed the wire sheath.  Images saved the permanent record.  We placed a Bentson wire followed by Omni catheter to the level of L1 performed CO2 angiography.  We also performed angiography limited the bilateral SFAs per we then crossed the bifurcation perform CO2 angiography down to the level of the knee.  We then selected the popliteal artery performed tibial angiography with contrast which demonstrated the above findings.  We then exchanged for a long 6 French sheath patient was fully heparinized.  We attempted to cross the intertibial artery in an antegrade fashion using quick cross select and V 18 as well as Glidewire.  We were unable to cross antegrade.  We then prepped out the right foot and ankle.  We used ultrasound to identify the right anterior tibial artery.  This was cannulated micropuncture needle followed by wire and sheath.  We then placed a CXI catheter retrograde and V 18.  We able to cross ultimately with a Glidewire retrograde.  We then snared an 014 wire we pulled through and through access.  We then performed laser arthrectomy the popliteal artery down the anterior tibial artery for the entirety.  We then performed primary balloon angioplasty with 3 mm balloon.  At completion there was significant dissection of the popliteal artery.  This was primarily stented.  To do this we did exchanged for an 035 wire then stented the popliteal.   Unfortunately was percent assistant dissection distally.  We ballooned this with the balloon inflated we removed our distal sheath.  We did obtain hemostasis with pressure there.  We then rewired antegrade down to the dorsalis pedis artery and then balloon angioplasty in the entire anterior tibial artery starting with 2.5 mm distally up to 3 mm more proximal.  Completion there was a nonflow limiting dissection at the intertibial artery takeoff.  We had very good flow distally onto the foot.  Satisfied with this and due to the length of the case and the patient's renal insufficiency we elected to stop.  He will need consideration of left lower extremity angiography.  We exchanged for short 6 French sheath over an 035 wire deployed a minx device.  He tolerated this procedure without any complication.     Contrast: 115cc  Margan Elias C. Donzetta Matters, MD [facility] [place] Office: [phone] Pager: [phone]

## 2020-01-19 NOTE — Progress Notes (Signed)
Called to see pt for oozing from left groin.  No significant hematoma but some oozing no pain or BP drop.  In light of age will admit for 23 hr obs to make sure no bleeding  Ruta Hinds, MD [facility] [place] Office: [phone]

## 2020-01-19 NOTE — Interval H&P Note (Signed)
History and Physical Interval Note:  01/19/2020 10:12 AM  Peter James.  has presented today for surgery, with the diagnosis of Peripheral Vascular Disease.  The various methods of treatment have been discussed with the patient and family. After consideration of risks, benefits and other options for treatment, the patient has consented to  Procedure(s): ABDOMINAL AORTOGRAM W/ Bilateral LOWER EXTREMITY Runoff (N/A) as a surgical intervention.  The patient's history has been reviewed, patient examined, no change in status, stable for surgery.  I have reviewed the patient's chart and labs.  Questions were answered to the patient's satisfaction.     Servando Snare

## 2020-01-19 NOTE — Progress Notes (Signed)
Up and walked and left groin with small amt firmness noted; oozing noted; pressure held x 67min and no further oozing noted and left groin feels more soft

## 2020-01-19 NOTE — Progress Notes (Signed)
Report called to RN for 6-e-15 and transferred via bed

## 2020-01-20 ENCOUNTER — Observation Stay (INDEPENDENT_AMBULATORY_CARE_PROVIDER_SITE_OTHER): Payer: Medicare Other

## 2020-01-20 ENCOUNTER — Other Ambulatory Visit: Payer: Self-pay | Admitting: Physician Assistant

## 2020-01-20 ENCOUNTER — Encounter (HOSPITAL_COMMUNITY): Payer: Self-pay | Admitting: Vascular Surgery

## 2020-01-20 DIAGNOSIS — E7849 Other hyperlipidemia: Secondary | ICD-10-CM | POA: Diagnosis not present

## 2020-01-20 DIAGNOSIS — E1140 Type 2 diabetes mellitus with diabetic neuropathy, unspecified: Secondary | ICD-10-CM | POA: Diagnosis not present

## 2020-01-20 DIAGNOSIS — I739 Peripheral vascular disease, unspecified: Secondary | ICD-10-CM | POA: Diagnosis not present

## 2020-01-20 DIAGNOSIS — R001 Bradycardia, unspecified: Secondary | ICD-10-CM | POA: Diagnosis not present

## 2020-01-20 DIAGNOSIS — E11621 Type 2 diabetes mellitus with foot ulcer: Secondary | ICD-10-CM | POA: Diagnosis not present

## 2020-01-20 DIAGNOSIS — I10 Essential (primary) hypertension: Secondary | ICD-10-CM | POA: Diagnosis not present

## 2020-01-20 DIAGNOSIS — Z89412 Acquired absence of left great toe: Secondary | ICD-10-CM | POA: Diagnosis not present

## 2020-01-20 HISTORY — DX: Bradycardia, unspecified: R00.1

## 2020-01-20 LAB — POCT I-STAT, CHEM 8
BUN: 54 mg/dL — ABNORMAL HIGH (ref 8–23)
Calcium, Ion: 1.13 mmol/L — ABNORMAL LOW (ref 1.15–1.40)
Chloride: 103 mmol/L (ref 98–111)
Creatinine, Ser: 2.1 mg/dL — ABNORMAL HIGH (ref 0.61–1.24)
Glucose, Bld: 108 mg/dL — ABNORMAL HIGH (ref 70–99)
HCT: 41 % (ref 39.0–52.0)
Hemoglobin: 13.9 g/dL (ref 13.0–17.0)
Potassium: 7 mmol/L (ref 3.5–5.1)
Sodium: 136 mmol/L (ref 135–145)
TCO2: 27 mmol/L (ref 22–32)

## 2020-01-20 LAB — BASIC METABOLIC PANEL
Anion gap: 8 (ref 5–15)
BUN: 28 mg/dL — ABNORMAL HIGH (ref 8–23)
CO2: 24 mmol/L (ref 22–32)
Calcium: 9.4 mg/dL (ref 8.9–10.3)
Chloride: 105 mmol/L (ref 98–111)
Creatinine, Ser: 1.7 mg/dL — ABNORMAL HIGH (ref 0.61–1.24)
GFR, Estimated: 37 mL/min — ABNORMAL LOW (ref >60)
Glucose, Bld: 109 mg/dL — ABNORMAL HIGH (ref 70–99)
Potassium: 5.1 mmol/L (ref 3.5–5.1)
Sodium: 137 mmol/L (ref 135–145)

## 2020-01-20 LAB — CBC
HCT: 37.4 % — ABNORMAL LOW (ref 39.0–52.0)
Hemoglobin: 12.3 g/dL — ABNORMAL LOW (ref 13.0–17.0)
MCH: 29.6 pg (ref 26.0–34.0)
MCHC: 32.9 g/dL (ref 30.0–36.0)
MCV: 89.9 fL (ref 80.0–100.0)
Platelets: 214 10*3/uL (ref 150–400)
RBC: 4.16 MIL/uL — ABNORMAL LOW (ref 4.22–5.81)
RDW: 15.9 % — ABNORMAL HIGH (ref 11.5–15.5)
WBC: 7.5 10*3/uL (ref 4.0–10.5)
nRBC: 0 % (ref 0.0–0.2)

## 2020-01-20 NOTE — Consult Note (Addendum)
Cardiology Consultation:   Patient ID: Peter James.; [id number]; 1926/01/25   Admit date: 01/19/2020 Date of Consult: 01/20/2020  Primary Care Provider: [name], MD Primary Cardiologist: No primary care provider on file. new (CM saw 2014) Primary Electrophysiologist:  None   Patient Profile:   Peter James. is a 86 y.o. male with a hx of non-obs CAD at cath 2014, LGIB 2012, DM, HTN, HLD, PAD, vertigo, who is being seen today for the evaluation of bradycardia at the request of Dr. Donzetta Matters.  History of Present Illness:   Peter James has a history of PAD.  He was evaluated by Dr. Donzetta Matters for lower extremity wounds and bilateral lower extremity ischemia.  He was scheduled for peripheral angiogram and came to the hospital for the procedure on 01/19/2020.  Procedure results are below.  He had Stent of right popliteal artery with 5 x 100 mm Tigris, Laser atherectomy of right popliteal artery and anterior tibial artery with 0.9 Auryon, and Plain balloon angioplasty right anterior tibial artery with 3 mm and 2.5 mm balloons.  He will need to lower extremity angiography the for left second toe ulceration.  He tolerated the procedure well.  His ECG from July shows sinus rhythm with first-degree AV block, PR interval 226 ms.  On telemetry in the hospital, he has had mostly 2-1 AV block with some conduction and episodes of wide-complex sinus rhythm.  No pauses greater than 2 seconds.  He does not appear to have chronotropic incompetence, but has not been out of bed.  Cardiology was asked to evaluate him.  Peter James has problems with vertigo.  Because of this, he has frequent episodes of dizziness, some of it is postural.  He says it will take the dizziness 15 to 20 minutes to resolve first thing in the morning.  In general, the dizziness is worse when he turns his head.  He has never passed out and does not think he well.  The dizziness and lightheaded feelings have never caused him to  fall.  He never has palpitations.  Even when his heart is currently skipping beats as it is doing frequently, he is not aware of it.  He has rare chest pain that is sharp and goes across his chest and leaves.  It is not exertional.  It has never lasted long enough that he had to take anything for it.  He does not have lower extremity edema.  He denies orthopnea or PND.  He checks his blood pressure on an infrequent basis at home, systolic blood pressure will be 160s-170s.  He thinks this is okay.  This is usually in the morning, about an hour after he takes his medication.  He has never noticed what his heart rate is.   Past Medical History:  Diagnosis Date   Acute lower GI bleeding    Recurrent: 2011, 2012   Bladder cancer ([place])    Transitional cell, 2001   Bradycardia 01/20/2020   CAD (coronary atherosclerotic disease)    Nonobstructive, 05/2012   Exertional dyspnea    EF 98-11%, grade 1 diastolic dysfunction, 11/1476   HLD (hyperlipidemia)    HTN (hypertension)    Neuropathy    Other insomnia    PAD (peripheral artery disease) ([place])    ABIs, 2009: 0.89 right; 0.99 left   Type 2 diabetes mellitus ([place])    Valvular heart disease    Vertigo     Past Surgical History:  Procedure Laterality Date   ABDOMINAL  AORTOGRAM W/LOWER EXTREMITY N/A 01/19/2020   Procedure: ABDOMINAL AORTOGRAM W/ Bilateral LOWER EXTREMITY [place];  Surgeon: Waynetta Sandy, MD;  Location: [facility];  Service: Cardiovascular;  Laterality: N/A;   AMPUTATION Left 10/01/2019   Procedure: LEFT GREAT TOE AMPUTATION AT METATARSOPHALANGEAL JOINT;  Surgeon: [name], MD;  Location: [facility];  Service: Orthopedics;  Laterality: Left;   CATARACT EXTRACTION     bi lateral   COLONOSCOPY     HEMORRHOID SURGERY     INGUINAL HERNIA REPAIR     PERIPHERAL VASCULAR INTERVENTION Right 01/19/2020   Procedure: PERIPHERAL VASCULAR INTERVENTION;  Surgeon: Waynetta Sandy, MD;  Location: [facility];  Service: Cardiovascular;  Laterality: Right;     Prior to Admission medications   Medication Sig Start Date End Date Taking? Authorizing Provider  allopurinol (ZYLOPRIM) 300 MG tablet Take 300 mg by mouth daily.    Yes [provider]  Alogliptin Benzoate 12.5 MG TABS Take 6.25 mg by mouth daily.   Yes [provider]  amLODipine (NORVASC) 10 MG tablet Take 1 tablet (10 mg total) by mouth daily. 06/03/12  Yes [name], [name], PA-C  aspirin EC 81 MG tablet Take 81 mg by mouth daily. Swallow whole.   Yes [provider]  atorvastatin (LIPITOR) 10 MG tablet Take 1 tablet (10 mg total) by mouth every evening. 10/17/12  Yes [name], [name], PA-C  carvedilol (COREG) 3.125 MG tablet Take 1 tablet (3.125 mg total) by mouth 2 (two) times daily with a meal. Patient taking differently: Take 3.125 mg by mouth daily.  10/09/19 01/14/20 Yes [name], DO  cholecalciferol (VITAMIN D3) 25 MCG (1000 UNIT) tablet Take 1,000 Units by mouth daily.   Yes [provider]  clotrimazole (LOTRIMIN) 1 % cream Apply 1 application topically daily as needed (pain).    Yes [provider]  nortriptyline (PAMELOR) 10 MG capsule Take 10 mg by mouth at bedtime.    Yes [provider]  polyethylene glycol (MIRALAX / GLYCOLAX) 17 g packet Take 17 g by mouth daily as needed for mild constipation.   Yes [provider]  sildenafil (VIAGRA) 100 MG tablet Take 100 mg by mouth daily as needed for erectile dysfunction.   Yes [provider]  tamsulosin (FLOMAX) 0.4 MG CAPS capsule Take 0.4 mg by mouth daily.    Yes [provider]  clopidogrel (PLAVIX) 75 MG tablet Take 1 tablet (75 mg total) by mouth daily. 01/19/20 01/18/21  Waynetta Sandy, MD    Inpatient Medications: Scheduled Meds:  allopurinol  300 mg Oral Daily   amLODipine  10 mg Oral Daily   aspirin EC  81 mg Oral Daily   atorvastatin  10 mg Oral QPM   carvedilol  3.125 mg  Oral Daily   cholecalciferol  1,000 Units Oral Daily   clopidogrel  300 mg Oral Once   Followed by   clopidogrel  75 mg Oral Q breakfast   docusate sodium  100 mg Oral BID   linagliptin  5 mg Oral Daily   nortriptyline  10 mg Oral QHS   pantoprazole  40 mg Oral Daily   potassium chloride  20-40 mEq Oral Once   sodium chloride flush  3 mL Intravenous Q12H   tamsulosin  0.4 mg Oral Daily   Continuous Infusions:  sodium chloride     sodium chloride 30 mL/hr at 01/20/20 0600   PRN Meds: sodium chloride, acetaminophen **OR** acetaminophen,  alum & mag hydroxide-simeth, guaiFENesin-dextromethorphan, hydrALAZINE, labetalol, metoprolol tartrate, morphine injection, ondansetron, oxyCODONE-acetaminophen, phenol, polyethylene glycol, sodium chloride flush  Allergies:   No Known Allergies  Social History:   Social History   Socioeconomic History   Marital status: Married    Spouse name: Not on file   Number of children: Not on file   Years of education: Not on file   Highest education level: Not on file  Occupational History   Not on file  Tobacco Use   Smoking status: Former Smoker    Quit date: 04/19/1972    Years since quitting: 47.7   Smokeless tobacco: Never Used  Vaping Use   Vaping Use: Never used  Substance and Sexual Activity   Alcohol use: Never   Drug use: Never   Sexual activity: Not on file  Other Topics Concern   Not on file  Social History Narrative   Not on file   Social Determinants of Health   Financial Resource Strain:    Difficulty of Paying Living Expenses: Not on file  Food Insecurity:    Worried About Charity fundraiser in the Last Year: Not on file   [employer] of Food in the Last Year: Not on file  Transportation Needs:    Lack of Transportation (Medical): Not on file   Lack of Transportation (Non-Medical): Not on file  Physical Activity:    Days of Exercise per Week: Not on file   Minutes of Exercise per Session: Not on file  Stress:    Feeling  of Stress : Not on file  Social Connections:    Frequency of Communication with Friends and Family: Not on file   Frequency of Social Gatherings with Friends and Family: Not on file   Attends Religious Services: Not on file   Active Member of Clubs or Organizations: Not on file   Attends Archivist Meetings: Not on file   Marital Status: Not on file  Intimate Partner Violence:    Fear of Current or Ex-Partner: Not on file   Emotionally Abused: Not on file   Physically Abused: Not on file   Sexually Abused: Not on file    Family History:   Family History  Problem Relation Age of Onset   Heart attack Mother    Heart attack Father    Heart attack Sister    Heart attack Sister    Heart attack Sister    Heart attack Sister    Family Status:  Family Status  Relation Name Status   Mother  (Not Specified)   Father  (Not Specified)   Sister  (Not Specified)   Sister  (Not Specified)   Sister  (Not Specified)   Sister  (Not Specified)    ROS:  Please see the history of present illness.  All other ROS reviewed and negative.     Physical Exam/Data:   Vitals:   01/20/20 0400 01/20/20 0750 01/20/20 0821 01/20/20 1145  BP: (!) 121/54 (!) 121/58  (!) 107/53  Pulse: (!) 44  71   Resp: 20 18  18   Temp: 97.8 F (36.6 C) 98.1 F (36.7 C)  98 F (36.7 C)  TempSrc: Oral Oral  Oral  SpO2: 97%     Weight:      Height:        Intake/Output Summary (Last 24 hours) at 01/20/2020 1256 Last data filed at 01/20/2020 0900 Gross per 24 hour  Intake 384.46 ml  Output 350 ml  Net 34.46 ml    Last 3 Weights 01/19/2020 01/09/2020 10/31/2019  Weight (lbs) 210 lb 209 lb 217 lb  Weight (kg) 95.255 kg 94.802 kg 98.431 kg     Body mass index is 28.48 kg/m.   General:  Well nourished, well developed, male in no acute distress HEENT: normal Lymph: no adenopathy Neck: JVD -not elevated Endocrine:  No thryomegaly Vascular: No carotid bruits; upper extremity pulses 2+  Cardiac:   normal S1, S2; slightly irregular rate and rhythm; soft murmur Lungs: Essentially clear bilaterally, no wheezing, rhonchi or rales  Abd: soft, nontender, no hepatomegaly  Ext: no edema Musculoskeletal:  No new deformities, BUE and BLE strength normal and equal Skin: warm and dry  Neuro:  CNs 2-12 intact, no focal abnormalities noted Psych:  Normal affect   EKG:  The EKG was personally reviewed and demonstrates: 11/02 ECG is 2:1 heart block on underlying sinus rhythm with first-degree AV block and PR interval of 226 ms.  Ventricular escape beats also seen, atrial rate is approximately 95 Telemetry:  Telemetry was personally reviewed and demonstrates: 2:1 heart block with episodes of wide-complex sinus rhythm, some PACs are conducted, no pauses greater than 2 seconds   CV studies:   ECHO: 09/30/2019  1. Left ventricular ejection fraction, by estimation, is 40 to 45%. The  left ventricle has mildly decreased function. The left ventricle  demonstrates global hypokinesis. Left ventricular diastolic function could  not be evaluated.   2. Right ventricular systolic function is normal. The right ventricular  size is normal.   3. The mitral valve is normal in structure. Mild mitral valve  regurgitation.   4. The aortic valve is tricuspid. Aortic valve regurgitation is mild.  Mild aortic valve sclerosis is present, with no evidence of aortic valve  stenosis.   5. The inferior vena cava is normal in size with greater than 50%  respiratory variability, suggesting right atrial pressure of 3 mmHg.  Comparison(s): Prior images unable to be directly viewed, comparison made  by report only.   Conclusion(s)/Recommendation(s): Unable to view prior images, but EF is  mild-moderately reduced compared to prior. There is significant  dyssynchrony and global hypokinesis, no clear focal wall motion  abnormalities.   PV CATH: 01/19/2020 Procedure Performed: 1.  Ultrasound-guided cannulation left common  femoral artery 2.  CO2 aortogram and limited bilateral lower extremity angiography 3.  Selection of right popliteal artery and right tibial artery angiography with contrast 4.  Ultrasound-guided cannulation right anterior tibial artery 5.  Stent of right popliteal artery with 5 x 100 mm Tigris 6.  Laser atherectomy of right popliteal artery and anterior tibial artery with 0.9 Auryon 7.  Plain balloon angioplasty right anterior tibial artery with 3 mm and 2.5 mm balloons 8.  Minx device closure left common femoral artery     Indications: 86 year old male with bilateral lower extremity ulceration.  He has previous left great toe amputation which is healed.  He has a left second toe ulcer and he has a right first metatarsal ulceration.  He is now indicated for angiography possible intervention.   Findings: His aorta and iliac segments are free of flow-limiting stenosis via CO2.  Bilateral common femoral SFAs are free of flow-limiting stenosis.  We did not evaluate the left tibial arteries given his renal insufficiency.  On the right side he had TP trunk appeared to be peroneal artery which occludes in the mid calf.  Anterior tibial artery also occludes in the mid calf reconstitutes in  the dorsalis pedis is on the foot.  After we were able to access retrograde we then performed laser arthrectomy and then primary balloon angioplasty followed by stenting of the popliteal artery.  We did have a dissection into the anterior tibial artery we had to rewire this antegrade balloon with 2.5 and 3 mm balloons.  At completion we had a nonflow limiting dissection at the takeoff of the intertibial artery we had very strong signals distally at the anterior tibial artery and onto the dorsalis pedis artery.   Patient will need left lower extremity angiography for left second toe ulceration.  VAS ABI w/out TBI 01/09/2020 +-------+-----------+-----------+------------+------------+  ABI/TBIToday's ABIToday's  TBIPrevious ABIPrevious TBI  +-------+-----------+-----------+------------+------------+  Right  0.57       0.48       0.7         0.58          +-------+-----------+-----------+------------+------------+  Left   0.71       amp        0.77        wound         +-------+-----------+-----------+------------+------------+   Previous ABI on 09/30/19.    Right ABIs and TBIs appear decreased. Left ABIs appear essentially  unchanged.     Summary:  Right: Resting right ankle-brachial index indicates moderate right lower  extremity arterial disease. The right toe-brachial index is abnormal. RT  great toe pressure = 73 mmHg.   Left: Resting left ankle-brachial index indicates moderate left lower  extremity arterial disease. The left toe-brachial index is abnormal.   CARDIAC CATH: 06/06/2012 Angiographic Findings:   Left main: No obstructive disease noted.    Left Anterior Descending Artery: Large caliber vessel that courses to the apex. There are two small to moderate caliber diagonal branches. No obstructive disease noted.    Circumflex Artery: Moderate caliber dominant vessel with moderate caliber obtuse marginal branch and small caliber posterolateral branch. The OM branch has 20% stenosis. The posterolateral branch has a 20% stenosis.    Right Coronary Artery: Small to moderate caliber non-dominant vessel with no obstructive disease noted.    Left Ventricular Angiogram: Deferred.    Impression: 1. Mild non-obstructive CAD (known to have normal LV function by echo) 2. Mild pulmonary HTN   Recommendations: Medical management of mild CAD. Would continue ASA and consider low dose statin.   Laboratory Data:   Chemistry Recent Labs  Lab 01/19/20 1031 01/19/20 1123 01/20/20 0227  NA 136 138 137  K 7.0* 4.6 5.1  CL 103 104 105  CO2  --  25 24  GLUCOSE 108* 109* 109*  BUN 54* 33* 28*  CREATININE 2.10* 1.95* 1.70*  CALCIUM  --  9.5 9.4  GFRNONAA  --  32* 37*   ANIONGAP  --  9 8    Lab Results  Component Value Date   ALT 23 10/03/2019   AST 28 10/03/2019   ALKPHOS 58 10/03/2019   BILITOT 0.2 (L) 10/03/2019   Hematology Recent Labs  Lab 01/19/20 1031 01/20/20 0227  WBC  --  7.5  RBC  --  4.16*  HGB 13.9 12.3*  HCT 41.0 37.4*  MCV  --  89.9  MCH  --  29.6  MCHC  --  32.9  RDW  --  15.9*  PLT  --  214   Cardiac Enzymes High Sensitivity Troponin:  No results for input(s): TROPONINIHS in the last 720 hours.    BNPNo results for input(s): BNP, PROBNP in the  last 168 hours.  DDimer No results for input(s): DDIMER in the last 168 hours. TSH: No results found for: TSH Lipids:No results found for: CHOL, HDL, LDLCALC, LDLDIRECT, TRIG, CHOLHDL HgbA1c: Lab Results  Component Value Date   HGBA1C 6.9 (H) 09/30/2019   Magnesium:  Magnesium  Date Value Ref Range Status  10/04/2019 1.9 1.7 - 2.4 mg/dL Final    Comment:    Performed at [facility], [address] [place], [place] [address]     Radiology/Studies:  PERIPHERAL VASCULAR CATHETERIZATION  Result Date: 01/19/2020 Patient name: Peter James. MRN: [id number] DOB: 08-30-1925 Sex: male 01/19/2020 Pre-operative Diagnosis: critical bilateral lower extremity ischemia with wounds Post-operative diagnosis:  Same Surgeon:  Erlene Quan C. Donzetta Matters, MD Procedure Performed: 1.  Ultrasound-guided cannulation left common femoral artery 2.  CO2 aortogram and limited bilateral lower extremity angiography 3.  Selection of right popliteal artery and right tibial artery angiography with contrast 4.  Ultrasound-guided cannulation right anterior tibial artery 5.  Stent of right popliteal artery with 5 x 100 mm Tigris 6.  Laser atherectomy of right popliteal artery and anterior tibial artery with 0.9 Auryon 7.  Plain balloon angioplasty right anterior tibial artery with 3 mm and 2.5 mm balloons 8.  Minx device closure left common femoral artery Indications: 86 year old male with bilateral lower  extremity ulceration.  He has previous left great toe amputation which is healed.  He has a left second toe ulcer and he has a right first metatarsal ulceration.  He is now indicated for angiography possible intervention. Findings: His aorta and iliac segments are free of flow-limiting stenosis via CO2.  Bilateral common femoral SFAs are free of flow-limiting stenosis.  We did not evaluate the left tibial arteries given his renal insufficiency.  On the right side he had TP trunk appeared to be peroneal artery which occludes in the mid calf.  Anterior tibial artery also occludes in the mid calf reconstitutes in the dorsalis pedis is on the foot.  After we were able to access retrograde we then performed laser arthrectomy and then primary balloon angioplasty followed by stenting of the popliteal artery.  We did have a dissection into the anterior tibial artery we had to rewire this antegrade balloon with 2.5 and 3 mm balloons.  At completion we had a nonflow limiting dissection at the takeoff of the intertibial artery we had very strong signals distally at the anterior tibial artery and onto the dorsalis pedis artery. Patient will need left lower extremity angiography for left second toe ulceration.  Procedure:  The patient was identified in the holding area and taken to room 8.  The patient was then placed supine on the table and prepped and draped in the usual sterile fashion.  A time out was called.  Ultrasound was used to evaluate the left common femoral artery.  This was noted be patent and compressible.  The area was anesthetized 1% lidocaine cannulated with micropuncture needle followed the wire sheath.  Images saved the permanent record.  We placed a Bentson wire followed by Omni catheter to the level of L1 performed CO2 angiography.  We also performed angiography limited the bilateral SFAs per we then crossed the bifurcation perform CO2 angiography down to the level of the knee.  We then selected the  popliteal artery performed tibial angiography with contrast which demonstrated the above findings.  We then exchanged for a long 6 French sheath patient was fully heparinized.  We attempted to cross the intertibial artery  in an antegrade fashion using quick cross select and V 18 as well as Glidewire.  We were unable to cross antegrade.  We then prepped out the right foot and ankle.  We used ultrasound to identify the right anterior tibial artery.  This was cannulated micropuncture needle followed by wire and sheath.  We then placed a CXI catheter retrograde and V 18.  We able to cross ultimately with a Glidewire retrograde.  We then snared an 014 wire we pulled through and through access.  We then performed laser arthrectomy the popliteal artery down the anterior tibial artery for the entirety.  We then performed primary balloon angioplasty with 3 mm balloon.  At completion there was significant dissection of the popliteal artery.  This was primarily stented.  To do this we did exchanged for an 035 wire then stented the popliteal.  Unfortunately was percent assistant dissection distally.  We ballooned this with the balloon inflated we removed our distal sheath.  We did obtain hemostasis with pressure there.  We then rewired antegrade down to the dorsalis pedis artery and then balloon angioplasty in the entire anterior tibial artery starting with 2.5 mm distally up to 3 mm more proximal.  Completion there was a nonflow limiting dissection at the intertibial artery takeoff.  We had very good flow distally onto the foot.  Satisfied with this and due to the length of the case and the patient's renal insufficiency we elected to stop.  He will need consideration of left lower extremity angiography.  We exchanged for short 6 French sheath over an 035 wire deployed a minx device.  He tolerated this procedure without any complication.  Contrast: 115cc Brandon C. Donzetta Matters, MD [facility] Office:  [phone] Pager: [phone]    Assessment and Plan:   1.  PAD: -Dr. Donzetta Matters did an intervention yesterday, he tolerated it well.  -From a PAD standpoint, he is ready for discharge.    2.  Bradycardia: -He is having 2-1 second-degree AV block on top of an underlying first-degree AV block. -Sometimes the second beat is conducted, and sometimes it isn't.  He is also having some episodes of sinus rhythm with a first-degree AV block only the complexes are wider and of a different morphology than his baseline. -He has been on low-dose beta blocker, carvedilol 3.125 mg twice daily. -Because of his history of vertigo, difficult to sort out if any sx related to the lower HR. -However, although heart rate drops below 50 upon occasion, it is not sustained less than 50. -In addition, sometimes he goes fast and the carvedilol may be helpful to moderate this -Can continue the beta-blocker to help moderate the faster heart rates. -Check ZIO to see if he is having any symptoms associated with the lower heart rates once he gets home and his activity level improves -Follow-up with EP  2.  Left ventricular dysfunction -His EF was 40-45% when it was checked in July when he was hospitalized for osteomyelitis in his foot and complained of shortness of breath, no WMA -No significant volume overload on exam -His activity level is poor, but he says he can walk 25 yards at a time. -There has been no recent change in this. -Continue to monitor for symptoms  3.  Hypertension: -His blood pressure today is improved, but it was high when he first came in -According to the patient, this is normal for him -He is encouraged to keep a blood pressure diary that includes  his heart rate and vary the times. -Bring this to follow-up appointments to help with blood pressure management  Otherwise, per Dr. Donzetta Matters Principal Problem:   PAD (peripheral artery disease) ([place]) Active Problems:   Hypertension associated with  diabetes ([place])   Bradycardia     For questions or updates, please contact [facility] Please consult [email or web address] for contact info under Cardiology/STEMI.   Signed, Rosaria Ferries, PA-C  01/20/2020 12:56 PM  Patient examined chart reviewed Discussed care with patient, daughter and PA. Exam with asymptomatic black male. SEM clear lungs Recent angiogram with PVD and poor circulation in feet. Telemetry review with NSR periods of short 2:1 block PAC/PVC;s. These are asymptomatic He is on low dose coreg. K has been somewhat high with CRF stage 3. I don't think he needs to stay in hospital any longer for w/u. Will arrange outpatient 14 day monitor to r/o high grade AV block but he is asymptomatic As baseline rhythm is fast would continue low dose coreg although this may need to be d/c pending results of monitor. He would benefit from outpatient EP evaluation and close monitoring of his CR/K. He is euvolemic with mildly decreased EF and not a candidate for ACE/ARB due to CRF. Given age and lack of chest pain do not think ischemic evaluation is warranted now   Jenkins Rouge MD [facility]

## 2020-01-20 NOTE — Progress Notes (Signed)
On telemetry pt appeared to be 2nd degree heart block, EKG obtained reading sinus rhythm with 1st degree A-V block with Premature ventricular complexes or Fusion complexes. Evaluated by charge nurse, still appears to be 2nd degree heart block type II. Pt asymptomatic but states that he has been having dyspnea with exertion for the last couple of weeks. [name], MD notified.   Elaina Hoops, RN

## 2020-01-20 NOTE — Progress Notes (Signed)
Zio patch placed onto patient.  All instructions and information reviewed with patient, they verbalize understanding with no questions. 

## 2020-01-20 NOTE — Progress Notes (Addendum)
[facility] [place]  Subjective  - States his right foot feels better and warmer.   Objective (!) 121/54 (!) 44 97.8 F (36.6 C) (Oral) 20 97%  Intake/Output Summary (Last 24 hours) at 01/20/2020 0730 Last data filed at 01/20/2020 0600 Gross per 24 hour  Intake 264.46 ml  Output 350 ml  Net -85.54 ml    Palpable PT pulse right LE Left groin soft without hematoma Heart new heart block on heart monitor, aymptomatic Lungs non labored breathing  Assessment/Planning: POD # 1  Procedure Performed: 1.  Ultrasound-guided cannulation left common femoral artery 2.  CO2 aortogram and limited bilateral lower extremity angiography 3.  Selection of right popliteal artery and right tibial artery angiography with contrast 4.  Ultrasound-guided cannulation right anterior tibial artery 5.  Stent of right popliteal artery with 5 x 100 mm [place] 6.  Laser atherectomy of right popliteal artery and anterior tibial artery with 0.9 [place] 7.  Plain balloon angioplasty right anterior tibial artery with 3 mm and 2.5 mm balloons 8.  Minx device closure left common femoral artery  New heart changes on heart monitor will get cardiology consult.  I have talked to Cards this am and they will see him for recommendations. Patent artrial flow to the right foot with palpable PT pulse F/U with DR. Donzetta Matters to discuss left LE plans and check on right LE  Stable for discharge home.  Home on ASA, Plavix and Statin   Roxy Horseman 01/20/2020 7:30 AM --  Laboratory Lab Results: Recent Labs    01/19/20 1031 01/20/20 0227  WBC  --  7.5  HGB 13.9 12.3*  HCT 41.0 37.4*  PLT  --  214   BMET Recent Labs    01/19/20 1123 01/20/20 0227  NA 138 137  K 4.6 5.1  CL 104 105  CO2 25 24  GLUCOSE 109* 109*  BUN 33* 28*  CREATININE 1.95* 1.70*  CALCIUM 9.5 9.4    COAG Lab Results  Component Value Date   INR 1.0 09/30/2019   No results found for: PTT  I have  independently interviewed and examined patient and agree with PA assessment and plan above.  Foot is warm and well-perfused left groin is soft.  We will get cardiology to evaluate their input much appreciated.  If okay from their standpoint can discharge later today and follow-up will be left lower extremity angiography with CO2 for left second toe ulceration.  Monnie Gudgel C. Donzetta Matters, MD [facility] [place] Office: [phone] Pager: [phone]

## 2020-01-20 NOTE — Discharge Instructions (Signed)
° °  [facility] [place] ° °Discharge Instructions ° °Lower Extremity Angiogram; Angioplasty/Stenting ° °Please refer to the following instructions for your post-procedure care. Your surgeon or physician assistant will discuss any changes with you. ° °Activity ° °Avoid lifting more than 8 pounds (1 gallons of milk) for 72 hours (3 days) after your procedure. You may walk as much as you can tolerate. It's OK to drive after 72 hours. ° °Bathing/Showering ° °You may shower the day after your procedure. If you have a bandage, you may remove it at 24- 48 hours. Clean your incision site with mild soap and water. Pat the area dry with a clean towel. ° °Diet ° °Resume your pre-procedure diet. There are no special food restrictions following this procedure. All patients with peripheral vascular disease should follow a low fat/low cholesterol diet. In order to heal from your surgery, it is CRITICAL to get adequate nutrition. Your body requires vitamins, minerals, and protein. Vegetables are the best source of vitamins and minerals. Vegetables also provide the perfect balance of protein. Processed food has little nutritional value, so try to avoid this. ° °Medications ° °Resume taking all of your medications unless your doctor tells you not to. If your incision is causing pain, you may take over-the-counter pain relievers such as acetaminophen (Tylenol) ° °Follow Up ° °Follow up will be arranged at the time of your procedure. You may have an office visit scheduled or may be scheduled for surgery. Ask your surgeon if you have any questions. ° °Please call us immediately for any of the following conditions: °•Severe or worsening pain your legs or feet at rest or with walking. °•Increased pain, redness, drainage at your groin puncture site. °•Fever of 101 degrees or higher. °•If you have any mild or slow bleeding from your puncture site: lie down, apply firm constant pressure over the area with a piece of  gauze or a clean wash cloth for 30 minutes- no peeking!, call 911 right away if you are still bleeding after 30 minutes, or if the bleeding is heavy and unmanageable. ° °Reduce your risk factors of vascular disease: ° °Stop smoking. If you would like help call [facility] at 1-800-QUIT-NOW (1-800-784-8669) or [place] at 336-586-4000. °Manage your cholesterol °Maintain a desired weight °Control your diabetes °Keep your blood pressure down ° °If you have any questions, please call the office at [phone] ° °

## 2020-01-21 NOTE — Discharge Summary (Signed)
Discharge Summary    Peter James. 08-27-1925 86 y.o. male  [id number]  Admission Date: 01/19/2020  Discharge Date: 01/20/2020  Physician: Servando Snare, MD  Admission Diagnosis: PAD (peripheral artery disease) ([place]) [I73.9]   HPI:   This is a 86 y.o. male with history of bilateral lower extremity ulceration.  Presented with left second toe ulcer and right first metatarsal ulceration.  Hospital Course:  The patient was admitted to the hospital and taken to the operating room on 01/19/2020 and underwent: CO2 aortogram and limited bilateral lower extremity angiography.  Stent placement of right popliteal artery, laser atherectomy of right popliteal artery and anterior tibial artery, balloon angioplasty of right anterior tibial artery.  The pt tolerated the procedure well and was transported to the PACU in good condition.   After the procedure, a small amount of bleeding was noted at the left groin puncture site.  The patient was admitted for overnight observation.  His vital signs remained stable and he remained afebrile.  On telemetry, the patient appeared to be in second-degree heart block and an EKG was performed.  This revealed sinus rhythm with first-degree AV block with premature ventricular complexes.  As concern remained for second-degree heart block, cardiology was consulted.  He was noted to have a 2-1 second-degree AV block on top of an underlying first-degree AV block.  He had a heart rate drops below 50 but not sustained less than 50.  Cardiology recommendations: Check ZIO to see if he is having any symptoms associated with the lower heart rates once he gets home and his activity level improves -Follow-up with EP He was stable for discharge home. The remainder of the hospital course consisted of increasing mobilization and increasing intake of solids without difficulty.  CBC    Component Value Date/Time   WBC 7.5 01/20/2020 0227   RBC 4.16 (L) 01/20/2020 0227    HGB 12.3 (L) 01/20/2020 0227   HCT 37.4 (L) 01/20/2020 0227   PLT 214 01/20/2020 0227   MCV 89.9 01/20/2020 0227   MCH 29.6 01/20/2020 0227   MCHC 32.9 01/20/2020 0227   RDW 15.9 (H) 01/20/2020 0227   LYMPHSABS 1.9 10/06/2019 0737   MONOABS 0.9 10/06/2019 0737   EOSABS 0.2 10/06/2019 0737   BASOSABS 0.1 10/06/2019 0737    BMET    Component Value Date/Time   NA 137 01/20/2020 0227   K 5.1 01/20/2020 0227   CL 105 01/20/2020 0227   CO2 24 01/20/2020 0227   GLUCOSE 109 (H) 01/20/2020 0227   BUN 28 (H) 01/20/2020 0227   CREATININE 1.70 (H) 01/20/2020 0227   CALCIUM 9.4 01/20/2020 0227   GFRNONAA 37 (L) 01/20/2020 0227   GFRAA >60 10/06/2019 0737      Discharge Instructions    Call MD for:  redness, tenderness, or signs of infection (pain, swelling, bleeding, redness, odor or green/yellow discharge around incision site)   Complete by: As directed    Call MD for:  severe or increased pain, loss or decreased feeling  in affected limb(s)   Complete by: As directed    Call MD for:  temperature >100.5   Complete by: As directed    Discharge patient   Complete by: As directed    Discharge disposition: 01-Home or Self Care   Discharge patient date: 01/20/2020   Resume previous diet   Complete by: As directed       Discharge Diagnosis:  PAD (peripheral artery disease) ([place]) [I73.9]  Secondary Diagnosis: Patient Active Problem  List   Diagnosis Date Noted  . Bradycardia 01/20/2020  . Epilepsy ([place]) 01/05/2020  . Left great toe amputee ([place]) 11/05/2019  . Osteomyelitis of great toe of left foot ([place]) 09/30/2019  . CKD (chronic kidney disease), stage III ([place]) 09/30/2019  . Type 2 diabetes mellitus ([place])   . Pain in limb 01/14/2013  . Enthesopathy 01/13/2013  . Hallux valgus, acquired 01/13/2013  . Onychomycosis due to dermatophyte 01/13/2013  . Peripheral neuropathy 01/13/2013  . CAD (coronary atherosclerotic disease)   . Hyperlipidemia associated with type 2 diabetes  mellitus ([place])   . Exertional dyspnea 05/20/2012  . PAD (peripheral artery disease) ([place])   . Hypertension associated with diabetes ([place])   . Valvular heart disease    Past Medical History:  Diagnosis Date  . Acute lower GI bleeding    Recurrent: 2011, 2012  . Bladder cancer ([place])    Transitional cell, 2001  . Bradycardia 01/20/2020  . CAD (coronary atherosclerotic disease)    Nonobstructive, 05/2012  . Exertional dyspnea    EF 82-42%, grade 1 diastolic dysfunction, 05/5359  . HLD (hyperlipidemia)   . HTN (hypertension)   . Neuropathy   . Other insomnia   . PAD (peripheral artery disease) ([place])    ABIs, 2009: 0.89 right; 0.99 left  . Type 2 diabetes mellitus ([place])   . Valvular heart disease   . Vertigo      Allergies as of 01/20/2020   No Known Allergies     Medication List    TAKE these medications   allopurinol 300 MG tablet Commonly known as: ZYLOPRIM Take 300 mg by mouth daily.   Alogliptin Benzoate 12.5 MG Tabs Take 6.25 mg by mouth daily.   amLODipine 10 MG tablet Commonly known as: NORVASC Take 1 tablet (10 mg total) by mouth daily.   aspirin EC 81 MG tablet Take 81 mg by mouth daily. Swallow whole.   atorvastatin 10 MG tablet Commonly known as: LIPITOR Take 1 tablet (10 mg total) by mouth every evening.   carvedilol 3.125 MG tablet Commonly known as: COREG Take 1 tablet (3.125 mg total) by mouth 2 (two) times daily with a meal. What changed: when to take this   cholecalciferol 25 MCG (1000 UNIT) tablet Commonly known as: VITAMIN D3 Take 1,000 Units by mouth daily.   clopidogrel 75 MG tablet Commonly known as: Plavix Take 1 tablet (75 mg total) by mouth daily.   clotrimazole 1 % cream Commonly known as: LOTRIMIN Apply 1 application topically daily as needed (pain).   nortriptyline 10 MG capsule Commonly known as: PAMELOR Take 10 mg by mouth at bedtime.   polyethylene glycol 17 g packet Commonly known as: MIRALAX / GLYCOLAX Take 17 g by  mouth daily as needed for mild constipation.   sildenafil 100 MG tablet Commonly known as: VIAGRA Take 100 mg by mouth daily as needed for erectile dysfunction.   tamsulosin 0.4 MG Caps capsule Commonly known as: FLOMAX Take 0.4 mg by mouth daily.         Instructions:  [facility]  Discharge Instructions  Lower Extremity Angiogram; Angioplasty/Stenting  Please refer to the following instructions for your post-procedure care. Your surgeon or physician assistant will discuss any changes with you.  Activity  Avoid lifting more than 8 pounds (1 gallons of milk) for 72 hours (3 days) after your procedure. You may walk as much as you can tolerate. It's OK to drive after 72 hours.  Bathing/Showering  You may shower the day after your procedure. If you have a bandage, you may remove it at 24- 48 hours. Clean your incision site with mild soap and water. Pat the area dry with a clean towel.  Diet  Resume your pre-procedure diet. There are no special food restrictions following this procedure. All patients with peripheral vascular disease should follow a low fat/low cholesterol diet. In order to heal from your surgery, it is CRITICAL to get adequate nutrition. Your body requires vitamins, minerals, and protein. Vegetables are the best source of vitamins and minerals. Vegetables also provide the perfect balance of protein. Processed food has little nutritional value, so try to avoid this.  Medications  Resume taking all of your medications unless your doctor tells you not to. If your incision is causing pain, you may take over-the-counter pain relievers such as acetaminophen (Tylenol)  Follow Up  Follow up will be arranged at the time of your procedure. You may have an office visit scheduled or may be scheduled for surgery. Ask your surgeon if you have any questions.  Please call us immediately for any of the following conditions: .Severe or  worsening pain your legs or feet at rest or with walking. .Increased pain, redness, drainage at your groin puncture site. .Fever of 101 degrees or higher. .If you have any mild or slow bleeding from your puncture site: lie down, apply firm constant pressure over the area with a piece of gauze or a clean wash cloth for 30 minutes- no peeking!, call 911 right away if you are still bleeding after 30 minutes, or if the bleeding is heavy and unmanageable.  Reduce your risk factors of vascular disease:  . Stop smoking. If you would like help call [facility] at 1-800-QUIT-NOW 820-762-0242) or [place] at 339-487-4306. . Manage your cholesterol . Maintain a desired weight . Control your diabetes . Keep your blood pressure down .  If you have any questions, please call the office at [phone]  Prescriptions given: none  Disposition: Home  Patient's condition: is Good  Follow up: 1. Dr. Glynn Octave in 4 weeks with LE duplex and ABIs   Risa Grill, PA-C [facility] [phone] 01/21/2020  3:30 PM

## 2020-01-23 ENCOUNTER — Telehealth: Payer: Self-pay

## 2020-01-23 ENCOUNTER — Other Ambulatory Visit: Payer: Self-pay

## 2020-01-23 NOTE — Telephone Encounter (Addendum)
Received [employer]-R[employer] alert report for 01/21/2020 7:58:03pm for complete heart block (41-56bpm) and 01/21/2020 at 8:15:01 for second degree AV block MobitzII (39-55bpm) Dr Blossom Hoops was notified by I-R[employer] on 01/21/2020 at 9:21.    Spoke with pt today who states he is unsure what he was doing at time of event and recalls no symptoms.  Alert taken to Dr Lovena Le, DOD and pt advised to continue monitoring.

## 2020-01-25 ENCOUNTER — Telehealth: Payer: Self-pay | Admitting: Internal Medicine

## 2020-01-25 ENCOUNTER — Telehealth: Payer: Self-pay | Admitting: Medical

## 2020-01-25 NOTE — Telephone Encounter (Signed)
[employer] paged regarding abnormal EKG.They reported high-degree AV block with a heart rate of 37bpm this morning at 7 AM. They did not record how long the episode was. They called the patient and he was asymptomatic. Otherwise heart monitor showed average heart rate of 50 with max rate of 94bpm. Requested a fax. Appears to show transient second degree Type 2. Reviewed with Dr. Domenic Polite. Appears he is on coreg, will stop this. Attempted to call patient twice with no answer. Left a message. Will let MD know. He has an appointment with EP 12/6.   Hannalee Castor Kathlen Mody, PA-C

## 2020-01-25 NOTE — Telephone Encounter (Signed)
Following up - Cadence was helping to divide and conquer OP calls earlier. Tried to reach out to pt again to relay instructions but no answer and VM full. We have [place] on file for daughter Colletta [place] who I was able to reach. She will check on her dad and relay instructions to stop carvedilol for now. She verbalized understanding and gratitude.

## 2020-01-25 NOTE — Telephone Encounter (Signed)
Received iRhythm notification of AV block lasting 1 minute this evening.   Reviewed rhythm personally, which showed AV Wenckebach with intermittent 3:1 high-grade AV block, though the sinus rate during these episodes was elevated with the first non-conducted p-wave occurring within the preceding T-wave. I spoke to the patient shortly after receiving the transmission and he was watching football during the episode and was feeling well at that time. He denies any recent dizziness or syncope/near syncope. He has carvedilol on his medication list but confirmed he is not taking it.  Dr. Rayann Heman notified and he was given instructions to call immediately if he were to experience any such symptoms. He expressed understanding.

## 2020-01-25 NOTE — Telephone Encounter (Signed)
Agree with stopping coreg should f/u with EP after monitor since his issue is AV block and ? Needing PPM

## 2020-01-26 ENCOUNTER — Telehealth: Payer: Self-pay | Admitting: Cardiovascular Disease

## 2020-01-26 NOTE — Telephone Encounter (Signed)
Patient called back. Patient stated he was eating a hot bowl of soup at the time of these episodes. Patient stated he has been having SOB the last couple of days and denies any chest pain. Patient stated his appointment with Dr. Rayann Heman has been moved up and he will see him this Thursday. Will wait to get fax from [facility] and have DOD sign.

## 2020-01-26 NOTE — Telephone Encounter (Signed)
Breanna from [facility] calling with an urgent notification on the patient's zio patch.

## 2020-01-26 NOTE — Telephone Encounter (Signed)
Called [employer] back. Today at 3:18 pm patient had a high rate AV Block (38 -111 bpm) for 32 seconds and then a Mobitz II (37 -111 bpm) for 24 seconds. Called patient left message for patient to call back.

## 2020-01-26 NOTE — Telephone Encounter (Signed)
Hi team, we see about moving this EP referral up? See chain below -abnormal monitor results so far. Scheduled to see [facility] 12/6 but noted to have high grade AV block on outpatient monitor per phone notes. I am out of the office until Wednesday- I cc'd Gracy Bruins here (EP scheduler) but will cc to triage team to see if they can help facilitate if Doylene Canning is not currently working. Thank you. Peter James

## 2020-01-26 NOTE — Telephone Encounter (Signed)
I think this was a [facility] patient seen by [employer]. I did his cath in 2014 but did not follow him. Probably best to go the EP route then f/u in the [place] office. Gerald Stabs

## 2020-01-26 NOTE — Telephone Encounter (Signed)
[facility] contacted patient and scheduled appointment for 01/29/20

## 2020-01-27 NOTE — Telephone Encounter (Addendum)
Left message to call back  Doctor of the day : MD Meda Coffee recommended to hold Carvedilol until further advisement at appointment with Dr. Rayann Heman on 01/29/20. IF any symptoms occured before then go to the ER for evaluation.

## 2020-01-27 NOTE — Telephone Encounter (Signed)
Received call from i[employer] to report that patient had a high grade AV block at 8:37am and 8:44am with heart rate 45 bpm. She reports that Mobitz-II was also present. They will fax over report. Patient is scheduled to see Dr. Rayann Heman 11/11.  Spoke with the patient who states that he was in his bathroom washing up at this time and denies any symptoms. He does report continued SOB with exertion.

## 2020-01-27 NOTE — Telephone Encounter (Signed)
Left message to call back  

## 2020-01-29 ENCOUNTER — Other Ambulatory Visit: Payer: Self-pay

## 2020-01-29 ENCOUNTER — Encounter: Payer: Self-pay | Admitting: *Deleted

## 2020-01-29 ENCOUNTER — Ambulatory Visit (INDEPENDENT_AMBULATORY_CARE_PROVIDER_SITE_OTHER): Payer: Medicare Other | Admitting: Internal Medicine

## 2020-01-29 ENCOUNTER — Telehealth: Payer: Self-pay | Admitting: Student

## 2020-01-29 ENCOUNTER — Telehealth: Payer: Self-pay | Admitting: Physician Assistant

## 2020-01-29 ENCOUNTER — Encounter: Payer: Self-pay | Admitting: Internal Medicine

## 2020-01-29 ENCOUNTER — Telehealth: Payer: Self-pay | Admitting: Internal Medicine

## 2020-01-29 VITALS — BP 132/66 | HR 74 | Ht 72.0 in | Wt 205.2 lb

## 2020-01-29 DIAGNOSIS — I453 Trifascicular block: Secondary | ICD-10-CM

## 2020-01-29 DIAGNOSIS — R001 Bradycardia, unspecified: Secondary | ICD-10-CM | POA: Diagnosis not present

## 2020-01-29 DIAGNOSIS — I519 Heart disease, unspecified: Secondary | ICD-10-CM

## 2020-01-29 DIAGNOSIS — I441 Atrioventricular block, second degree: Secondary | ICD-10-CM | POA: Diagnosis not present

## 2020-01-29 NOTE — Telephone Encounter (Signed)
   Received page from [facility] about complete heart block. Called and spoke with I[facility]. Patient had 60 seconds of complete heart block 49 to 75 bpm. Patient also noted to have AV block 2nd degree type 2 for a few seconds. Per chart review, we have received several calls for [facility] with similar reports. Patient saw Dr. Rayann Heman today and plan is for PPM implantation on 02/11/2020. Called and spoke with patient. He is completely asymptomatic and states he feels "great." Confirmed that patient is no longer taking Coreg. Advised patient that if he develops any lightheadedness, dizziness, near syncope, etc, he should be evaluated in the ED. Otherwise, can wait until 02/11/2020 for PPM. Patient voiced understanding and agreed. Discussed patient and plan with Dr. Domenic Polite. Will route note to Dr. Rayann Heman just so he is aware.  Darreld Mclean, PA-C 01/29/2020 8:13 PM

## 2020-01-29 NOTE — Telephone Encounter (Signed)
[employer] called to notify us of abnormal heart rhythm noted earlier this morning. This information was also called into the [place] and discussed at the pt's appointment with Dr. Rayann Heman today.

## 2020-01-29 NOTE — H&P (View-Only) (Signed)
Electrophysiology Office Note   Date:  01/29/2020   ID:  Peter Furlong., DOB May 11, 1925, MRN [id number]  PCP:  [name], MD  Cardiologist:  Dr Johnsie Cancel Primary Electrophysiologist: Thompson Grayer, MD    CC: bradycardia   History of Present Illness: Peter Doell. is a 86 y.o. male who presents today for electrophysiology evaluation.   He is referred by Dr Johnsie Cancel and Rosaria Ferries for EP consultation regarding bradycardia. The patient underwent peripheral angiogram 01/19/20 by Dr Donzetta Matters.  He was noted to have second degree heart block in the hospital.  He is not very active.  He had an event monitor placed which has documented 2:1 AV block.  His beta blocker was not stopped however a Zio was placed. He has occasional dizziness but denies presyncope or syncope.  He also has exertional SOB which is chronic. Today, he denies symptoms of chest pain,  lower extremity edema,  or neurologic sequela. The patient is tolerating medications without difficulties and is otherwise without complaint today.    Past Medical History:  Diagnosis Date  . Acute lower GI bleeding    Recurrent: 2011, 2012  . Bladder cancer ([place])    Transitional cell, 2001  . Bradycardia 01/20/2020  . CAD (coronary atherosclerotic disease)    Nonobstructive, 05/2012  . Exertional dyspnea    EF 77-93%, grade 1 diastolic dysfunction, 11/298  . HLD (hyperlipidemia)   . HTN (hypertension)   . Neuropathy   . Other insomnia   . PAD (peripheral artery disease) ([name])    ABIs, 2009: 0.89 right; 0.99 left  . Type 2 diabetes mellitus ([place])   . Valvular heart disease   . Vertigo    Past Surgical History:  Procedure Laterality Date  . ABDOMINAL AORTOGRAM W/LOWER EXTREMITY N/A 01/19/2020   Procedure: ABDOMINAL AORTOGRAM W/ Bilateral LOWER EXTREMITY Runoff;  Surgeon: Waynetta Sandy, MD;  Location: [facility];  Service: Cardiovascular;  Laterality: N/A;  . AMPUTATION Left 10/01/2019   Procedure: LEFT  GREAT TOE AMPUTATION AT METATARSOPHALANGEAL JOINT;  Surgeon: [name], MD;  Location: [facility];  Service: Orthopedics;  Laterality: Left;  . CATARACT EXTRACTION     bi lateral  . COLONOSCOPY    . HEMORRHOID SURGERY    . INGUINAL HERNIA REPAIR    . PERIPHERAL VASCULAR INTERVENTION Right 01/19/2020   Procedure: PERIPHERAL VASCULAR INTERVENTION;  Surgeon: Waynetta Sandy, MD;  Location: [facility];  Service: Cardiovascular;  Laterality: Right;     Current Outpatient Medications  Medication Sig Dispense Refill  . allopurinol (ZYLOPRIM) 300 MG tablet Take 300 mg by mouth daily.     . Alogliptin Benzoate 12.5 MG TABS Take 6.25 mg by mouth daily.    [name] amLODipine (NORVASC) 10 MG tablet Take 1 tablet (10 mg total) by mouth daily. 90 tablet 3  . aspirin EC 81 MG tablet Take 81 mg by mouth daily. Swallow whole.    [name] atorvastatin (LIPITOR) 10 MG tablet Take 1 tablet (10 mg total) by mouth every evening. 30 tablet 6  . cholecalciferol (VITAMIN D3) 25 MCG (1000 UNIT) tablet Take 1,000 Units by mouth daily.    . clopidogrel (PLAVIX) 75 MG tablet Take 1 tablet (75 mg total) by mouth daily. 30 tablet 3  . clotrimazole (LOTRIMIN) 1 % cream Apply 1 application topically daily as needed (pain).     . nortriptyline (PAMELOR) 10 MG capsule Take 10 mg by mouth at bedtime.     [name]  polyethylene glycol (MIRALAX / GLYCOLAX) 17 g packet Take 17 g by mouth daily as needed for mild constipation.    . sildenafil (VIAGRA) 100 MG tablet Take 100 mg by mouth daily as needed for erectile dysfunction.    . tamsulosin (FLOMAX) 0.4 MG CAPS capsule Take 0.4 mg by mouth daily.     . carvedilol (COREG) 3.125 MG tablet Take 1 tablet (3.125 mg total) by mouth 2 (two) times daily with a meal. (Patient taking differently: Take 3.125 mg by mouth daily. ) 60 tablet 0   No current facility-administered medications for this visit.    Allergies:   Patient has no known allergies.   Social History:  The patient   reports that he quit smoking about 47 years ago. He has never used smokeless tobacco. He reports that he does not drink alcohol and does not use drugs.   Family History:  The patient's family history includes Heart attack in his father, mother, sister, sister, sister, and sister.    ROS:  Please see the history of present illness.   All other systems are personally reviewed and negative.    PHYSICAL EXAM: VS:  BP 132/66   Pulse 74   Ht 6' (1.829 m)   Wt 205 lb 3.2 oz (93.1 kg)   SpO2 99%   BMI 27.83 kg/m  , BMI Body mass index is 27.83 kg/m. GEN: elderly, in no acute distress HEENT: normal Neck: no JVD  Cardiac: RRR  Respiratory:   normal work of breathing GI: soft  MS: diffuse atrophy Skin: warm and dry  Neuro:  Strength and sensation are intact Psych: euthymic mood, full affect  EKG:  EKG is ordered today. The ekg ordered today is personally reviewed and shows sinus rhythm with both left bundle branch block and RBBB observed (trifascicular block)   Recent Labs: 10/03/2019: ALT 23 10/04/2019: B Natriuretic Peptide 72.7; Magnesium 1.9 01/20/2020: BUN 28; Creatinine, Ser 1.70; Hemoglobin 12.3; Platelets 214; Potassium 5.1; Sodium 137  personally reviewed   Lipid Panel  No results found for: CHOL, TRIG, HDL, CHOLHDL, VLDL, LDLCALC, LDLDIRECT personally reviewed   Wt Readings from Last 3 Encounters:  01/29/20 205 lb 3.2 oz (93.1 kg)  01/19/20 210 lb (95.3 kg)  01/09/20 209 lb (94.8 kg)      Other studies personally reviewed: Additional studies/ records that were reviewed today include: echo (EF 40-45%), event monitor, recent hospital records  Review of the above records today demonstrates: as above   ASSESSMENT AND PLAN:  1.  Mobitz II second degree AV block The patient has symptomatic bradycardia. He has advanced degenerative conduction system disease with trifascicular block. No reversible causes are found.  I would therefore recommend pacemaker implantation at  this time.  Given EF < 50%, will plan CRT-P.  He is not an ICD candidate given relatively preserved EF and advanced age.  Risks, benefits, alternatives to pacemaker implantation were discussed in detail with the patient and his son in law (caregiver) today. The patient understands that the risks include but are not limited to bleeding, infection, pneumothorax, perforation, tamponade, vascular damage, renal failure, MI, stroke, death,  and lead dislodgement and wishes to proceed. We will therefore schedule the procedure at the next available time.   Discontinue coreg in the interim. He will eventually need to have this restarted for CHF management  2. Nonischemic CM/ chronic systolic dysfunction EF 69-62% Will plan CRT-P as above as he will V pace > 40% and I worry about worsening  CHF. He will need CHF optimization after device implant.    Current medicines are reviewed at length with the patient today.   The patient does not have concerns regarding his medicines.  The following changes were made today:  none  Labs/ tests ordered today include:  No orders of the defined types were placed in this encounter.    Army Fossa, MD  01/29/2020 10:56 AM     [facility] [address] [place] [address] [phone] (office) [phone] (fax)

## 2020-01-29 NOTE — Telephone Encounter (Signed)
The patient has an appointment with Dr. Rayann Heman today, 11/11, at 1015. See office note for details.

## 2020-01-29 NOTE — Telephone Encounter (Signed)
Ranvir with [employer] is calling to report an abnormal EKG

## 2020-01-29 NOTE — Patient Instructions (Addendum)
Medication Instructions:  Stop you Carvedilol  *If you need a refill on your cardiac medications before your next appointment, please call your pharmacy*  Lab Work: CBC, BMP  If you have labs (blood work) drawn today and your tests are completely normal, you will receive your results only by: [name] MyChart Message (if you have MyChart) OR . A paper copy in the mail If you have any lab test that is abnormal or we need to change your treatment, we will call you to review the results.  Testing/Procedures: None ordered.  Follow-Up: At [facility], you and your health needs are our priority.  As part of our continuing mission to provide you with exceptional heart care, we have created designated Provider Care Teams.  These Care Teams include your primary Cardiologist (physician) and Advanced Practice Providers (APPs -  Physician Assistants and Nurse Practitioners) who all work together to provide you with the care you need, when you need it.  We recommend signing up for the patient portal called "MyChart".  Sign up information is provided on this After Visit Summary.  MyChart is used to connect with patients for Virtual Visits (Telemedicine).  Patients are able to view lab/test results, encounter notes, upcoming appointments, etc.  Non-urgent messages can be sent to your provider as well.   To learn more about what you can do with MyChart, go to [email or web address].     Other Instructions:  Pacemaker Implantation, Adult Pacemaker implantation is a procedure to place a pacemaker inside your chest. A pacemaker is a small computer that sends electrical signals to the heart and helps your heart beat normally. A pacemaker also stores information about your heart rhythms. You may need pacemaker implantation if you:  Have a slow heartbeat (bradycardia).  Faint (syncope).  Have shortness of breath (dyspnea) due to heart problems. The pacemaker attaches to your heart through a wire, called a  lead. Sometimes just one lead is needed. Other times, there will be two leads. There are two types of pacemakers:  Transvenous pacemaker. This type is placed under the skin or muscle of your chest. The lead goes through a vein in the chest area to reach the inside of the heart.  Epicardial pacemaker. This type is placed under the skin or muscle of your chest or belly. The lead goes through your chest to the outside of the heart. Tell a health care provider about:  Any allergies you have.  All medicines you are taking, including vitamins, herbs, eye drops, creams, and over-the-counter medicines.  Any problems you or family members have had with anesthetic medicines.  Any blood or bone disorders you have.  Any surgeries you have had.  Any medical conditions you have.  Whether you are pregnant or may be pregnant. What are the risks? Generally, this is a safe procedure. However, problems may occur, including:  Infection.  Bleeding.  Failure of the pacemaker or the lead.  Collapse of a lung or bleeding into a lung.  Blood clot inside a blood vessel with a lead.  Damage to the heart.  Infection inside the heart (endocarditis).  Allergic reactions to medicines. What happens before the procedure? Staying hydrated Follow instructions from your health care provider about hydration, which may include:  Up to 2 hours before the procedure - you may continue to drink clear liquids, such as water, clear fruit juice, black coffee, and plain tea. Eating and drinking restrictions Follow instructions from your health care provider about eating and drinking, which may  include:  8 hours before the procedure - stop eating heavy meals or foods such as meat, fried foods, or fatty foods.  6 hours before the procedure - stop eating light meals or foods, such as toast or cereal.  6 hours before the procedure - stop drinking milk or drinks that contain milk.  2 hours before the procedure -  stop drinking clear liquids. Medicines  Ask your health care provider about: ? Changing or stopping your regular medicines. This is especially important if you are taking diabetes medicines or blood thinners. ? Taking medicines such as aspirin and ibuprofen. These medicines can thin your blood. Do not take these medicines before your procedure if your health care provider instructs you not to.  You may be given antibiotic medicine to help prevent infection. General instructions  You will have a heart evaluation. This may include an electrocardiogram (ECG), chest X-ray, and heart imaging (echocardiogram,  or echo) tests.  You will have blood tests.  Do not use any products that contain nicotine or tobacco, such as cigarettes and e-cigarettes. If you need help quitting, ask your health care provider.  Plan to have someone take you home from the hospital or clinic.  If you will be going home right after the procedure, plan to have someone with you for 24 hours.  Ask your health care provider how your surgical site will be marked or identified. What happens during the procedure?  To reduce your risk of infection: ? Your health care team will wash or sanitize their hands. ? Your skin will be washed with soap. ? Hair may be removed from the surgical area.  An IV tube will be inserted into one of your veins.  You will be given one or more of the following: ? A medicine to help you relax (sedative). ? A medicine to numb the area (local anesthetic). ? A medicine to make you fall asleep (general anesthetic).  If you are getting a transvenous pacemaker: ? An incision will be made in your upper chest. ? A pocket will be made for the pacemaker. It may be placed under the skin or between layers of muscle. ? The lead will be inserted into a blood vessel that returns to the heart. ? While X-rays are taken by an imaging machine (fluoroscopy), the lead will be advanced through the vein to the  inside of your heart. ? The other end of the lead will be tunneled under the skin and attached to the pacemaker.  If you are getting an epicardial pacemaker: ? An incision will be made near your ribs or breastbone (sternum) for the lead. ? The lead will be attached to the outside of your heart. ? Another incision will be made in your chest or upper belly to create a pocket for the pacemaker. ? The free end of the lead will be tunneled under the skin and attached to the pacemaker.  The transvenous or epicardial pacemaker will be tested. Imaging studies may be done to check the lead position.  The incisions will be closed with stitches (sutures), adhesive strips, or skin glue.  Bandages (dressing) will be placed over the incisions. The procedure may vary among health care providers and hospitals. What happens after the procedure?  Your blood pressure, heart rate, breathing rate, and blood oxygen level will be monitored until the medicines you were given have worn off.  You will be given antibiotics and pain medicine.  ECG and chest x-rays will be done.  You will wear a continuous type of ECG (Holter monitor) to check your heart rhythm.  Your health care provider will program the pacemaker.  Do not drive for 24 hours if you received a sedative. This information is not intended to replace advice given to you by your health care provider. Make sure you discuss any questions you have with your health care provider. Document Revised: 11/23/2017 Document Reviewed: 08/18/2015 Elsevier Patient Education  [place].

## 2020-01-29 NOTE — Telephone Encounter (Signed)
Pt seeing Dr Rayann Heman today at 10:15 am .Adonis Housekeeper

## 2020-01-29 NOTE — Progress Notes (Signed)
Electrophysiology Office Note   Date:  01/29/2020   ID:  Peter James., DOB 09/29/1925, MRN [id number]  PCP:  [name], MD  Cardiologist:  Dr Johnsie Cancel Primary Electrophysiologist: Thompson Grayer, MD    CC: bradycardia   History of Present Illness: Peter James. is a 86 y.o. male who presents today for electrophysiology evaluation.   He is referred by Dr Johnsie Cancel and Rosaria Ferries for EP consultation regarding bradycardia. The patient underwent peripheral angiogram 01/19/20 by Dr Donzetta Matters.  He was noted to have second degree heart block in the hospital.  He is not very active.  He had an event monitor placed which has documented 2:1 AV block.  His beta blocker was not stopped however a Zio was placed. He has occasional dizziness but denies presyncope or syncope.  He also has exertional SOB which is chronic. Today, he denies symptoms of chest pain,  lower extremity edema,  or neurologic sequela. The patient is tolerating medications without difficulties and is otherwise without complaint today.    Past Medical History:  Diagnosis Date  . Acute lower GI bleeding    Recurrent: 2011, 2012  . Bladder cancer ([place])    Transitional cell, 2001  . Bradycardia 01/20/2020  . CAD (coronary atherosclerotic disease)    Nonobstructive, 05/2012  . Exertional dyspnea    EF 27-25%, grade 1 diastolic dysfunction, 05/6642  . HLD (hyperlipidemia)   . HTN (hypertension)   . Neuropathy   . Other insomnia   . PAD (peripheral artery disease) ([place])    ABIs, 2009: 0.89 right; 0.99 left  . Type 2 diabetes mellitus ([place])   . Valvular heart disease   . Vertigo    Past Surgical History:  Procedure Laterality Date  . ABDOMINAL AORTOGRAM W/LOWER EXTREMITY N/A 01/19/2020   Procedure: ABDOMINAL AORTOGRAM W/ Bilateral LOWER EXTREMITY Runoff;  Surgeon: Waynetta Sandy, MD;  Location: [place] [facility];  Service: Cardiovascular;  Laterality: N/A;  . AMPUTATION Left 10/01/2019   Procedure: LEFT  GREAT TOE AMPUTATION AT METATARSOPHALANGEAL JOINT;  Surgeon: [name], MD;  Location: [facility];  Service: Orthopedics;  Laterality: Left;  . CATARACT EXTRACTION     bi lateral  . COLONOSCOPY    . HEMORRHOID SURGERY    . INGUINAL HERNIA REPAIR    . PERIPHERAL VASCULAR INTERVENTION Right 01/19/2020   Procedure: PERIPHERAL VASCULAR INTERVENTION;  Surgeon: Waynetta Sandy, MD;  Location: [place] [facility];  Service: Cardiovascular;  Laterality: Right;     Current Outpatient Medications  Medication Sig Dispense Refill  . allopurinol (ZYLOPRIM) 300 MG tablet Take 300 mg by mouth daily.     . Alogliptin Benzoate 12.5 MG TABS Take 6.25 mg by mouth daily.    [name] amLODipine (NORVASC) 10 MG tablet Take 1 tablet (10 mg total) by mouth daily. 90 tablet 3  . aspirin EC 81 MG tablet Take 81 mg by mouth daily. Swallow whole.    [name] atorvastatin (LIPITOR) 10 MG tablet Take 1 tablet (10 mg total) by mouth every evening. 30 tablet 6  . cholecalciferol (VITAMIN D3) 25 MCG (1000 UNIT) tablet Take 1,000 Units by mouth daily.    . clopidogrel (PLAVIX) 75 MG tablet Take 1 tablet (75 mg total) by mouth daily. 30 tablet 3  . clotrimazole (LOTRIMIN) 1 % cream Apply 1 application topically daily as needed (pain).     . nortriptyline (PAMELOR) 10 MG capsule Take 10 mg by mouth at bedtime.     [name]  polyethylene glycol (MIRALAX / GLYCOLAX) 17 g packet Take 17 g by mouth daily as needed for mild constipation.    . sildenafil (VIAGRA) 100 MG tablet Take 100 mg by mouth daily as needed for erectile dysfunction.    . tamsulosin (FLOMAX) 0.4 MG CAPS capsule Take 0.4 mg by mouth daily.     . carvedilol (COREG) 3.125 MG tablet Take 1 tablet (3.125 mg total) by mouth 2 (two) times daily with a meal. (Patient taking differently: Take 3.125 mg by mouth daily. ) 60 tablet 0   No current facility-administered medications for this visit.    Allergies:   Patient has no known allergies.   Social History:  The patient   reports that he quit smoking about 47 years ago. He has never used smokeless tobacco. He reports that he does not drink alcohol and does not use drugs.   Family History:  The patient's family history includes Heart attack in his father, mother, sister, sister, sister, and sister.    ROS:  Please see the history of present illness.   All other systems are personally reviewed and negative.    PHYSICAL EXAM: VS:  BP 132/66   Pulse 74   Ht 6' (1.829 m)   Wt 205 lb 3.2 oz (93.1 kg)   SpO2 99%   BMI 27.83 kg/m  , BMI Body mass index is 27.83 kg/m. GEN: elderly, in no acute distress HEENT: normal Neck: no JVD  Cardiac: RRR  Respiratory:   normal work of breathing GI: soft  MS: diffuse atrophy Skin: warm and dry  Neuro:  Strength and sensation are intact Psych: euthymic mood, full affect  EKG:  EKG is ordered today. The ekg ordered today is personally reviewed and shows sinus rhythm with both left bundle branch block and RBBB observed (trifascicular block)   Recent Labs: 10/03/2019: ALT 23 10/04/2019: B Natriuretic Peptide 72.7; Magnesium 1.9 01/20/2020: BUN 28; Creatinine, Ser 1.70; Hemoglobin 12.3; Platelets 214; Potassium 5.1; Sodium 137  personally reviewed   Lipid Panel  No results found for: CHOL, TRIG, HDL, CHOLHDL, VLDL, LDLCALC, LDLDIRECT personally reviewed   Wt Readings from Last 3 Encounters:  01/29/20 205 lb 3.2 oz (93.1 kg)  01/19/20 210 lb (95.3 kg)  01/09/20 209 lb (94.8 kg)      Other studies personally reviewed: Additional studies/ records that were reviewed today include: echo (EF 40-45%), event monitor, recent hospital records  Review of the above records today demonstrates: as above   ASSESSMENT AND PLAN:  1.  Mobitz II second degree AV block The patient has symptomatic bradycardia. He has advanced degenerative conduction system disease with trifascicular block. No reversible causes are found.  I would therefore recommend pacemaker implantation at  this time.  Given EF < 50%, will plan CRT-P.  He is not an ICD candidate given relatively preserved EF and advanced age.  Risks, benefits, alternatives to pacemaker implantation were discussed in detail with the patient and his son in law (caregiver) today. The patient understands that the risks include but are not limited to bleeding, infection, pneumothorax, perforation, tamponade, vascular damage, renal failure, MI, stroke, death,  and lead dislodgement and wishes to proceed. We will therefore schedule the procedure at the next available time.   Discontinue coreg in the interim. He will eventually need to have this restarted for CHF management  2. Nonischemic CM/ chronic systolic dysfunction EF 23-53% Will plan CRT-P as above as he will V pace > 40% and I worry about worsening  CHF. He will need CHF optimization after device implant.    Current medicines are reviewed at length with the patient today.   The patient does not have concerns regarding his medicines.  The following changes were made today:  none  Labs/ tests ordered today include:  No orders of the defined types were placed in this encounter.    Army Fossa, MD  01/29/2020 10:56 AM     [facility] [address] [place] [address] [phone] (office) [phone] (fax)

## 2020-01-29 NOTE — Telephone Encounter (Signed)
[employer] called reporting this am at 8:34 am 90 second heart block 50-78 bpm./cy

## 2020-01-30 ENCOUNTER — Telehealth: Payer: Self-pay | Admitting: Internal Medicine

## 2020-01-30 ENCOUNTER — Telehealth: Payer: Self-pay | Admitting: *Deleted

## 2020-01-30 LAB — CBC WITH DIFFERENTIAL/PLATELET
Basophils Absolute: 0 10*3/uL (ref 0.0–0.2)
Basos: 0 %
EOS (ABSOLUTE): 0 10*3/uL (ref 0.0–0.4)
Eos: 0 %
Hematocrit: 39.8 % (ref 37.5–51.0)
Hemoglobin: 13 g/dL (ref 13.0–17.7)
Immature Grans (Abs): 0.1 10*3/uL (ref 0.0–0.1)
Immature Granulocytes: 1 %
Lymphocytes Absolute: 1.8 10*3/uL (ref 0.7–3.1)
Lymphs: 11 %
MCH: 29.3 pg (ref 26.6–33.0)
MCHC: 32.7 g/dL (ref 31.5–35.7)
MCV: 90 fL (ref 79–97)
Monocytes Absolute: 1 10*3/uL — ABNORMAL HIGH (ref 0.1–0.9)
Monocytes: 7 %
Neutrophils Absolute: 12.8 10*3/uL — ABNORMAL HIGH (ref 1.4–7.0)
Neutrophils: 81 %
Platelets: 415 10*3/uL (ref 150–450)
RBC: 4.43 x10E6/uL (ref 4.14–5.80)
RDW: 15.2 % (ref 11.6–15.4)
WBC: 15.7 10*3/uL — ABNORMAL HIGH (ref 3.4–10.8)

## 2020-01-30 LAB — BASIC METABOLIC PANEL
BUN/Creatinine Ratio: 27 — ABNORMAL HIGH (ref 10–24)
BUN: 49 mg/dL — ABNORMAL HIGH (ref 10–36)
CO2: 22 mmol/L (ref 20–29)
Calcium: 10.4 mg/dL — ABNORMAL HIGH (ref 8.6–10.2)
Chloride: 101 mmol/L (ref 96–106)
Creatinine, Ser: 1.79 mg/dL — ABNORMAL HIGH (ref 0.76–1.27)
GFR calc Af Amer: 37 mL/min/{1.73_m2} — ABNORMAL LOW (ref >59)
GFR calc non Af Amer: 32 mL/min/{1.73_m2} — ABNORMAL LOW (ref >59)
Glucose: 105 mg/dL — ABNORMAL HIGH (ref 65–99)
Potassium: 5.2 mmol/L (ref 3.5–5.2)
Sodium: 139 mmol/L (ref 134–144)

## 2020-01-30 NOTE — Telephone Encounter (Signed)
Received critical call from [employer]. Reported episode of CHB at 1:51 pm, HR 44-70 bpm, lasting 18 sec. Pt reports asymptomatic, no issues. Will continue to monitor.

## 2020-01-30 NOTE — Telephone Encounter (Signed)
Melissa with [employer] is calling to report an urgent notification.

## 2020-01-30 NOTE — Telephone Encounter (Signed)
[facility] called in to report:  Otila Kluver, RN took report)  CHB at 8:07 am, 14.5 seconds, HR 49-110 bpm Followed up with pt who states he was asymptomatic, didn't feel anything at that time. Pt is scheduled for PPM implant  11/24. Aware to go to ED if symptoms begin. Patient verbalized understanding and agreeable to plan.

## 2020-02-06 ENCOUNTER — Other Ambulatory Visit (HOSPITAL_COMMUNITY): Payer: Medicare Other

## 2020-02-09 ENCOUNTER — Other Ambulatory Visit (HOSPITAL_COMMUNITY)
Admission: RE | Admit: 2020-02-09 | Discharge: 2020-02-09 | Disposition: A | Discharge status: Home / Self Care | Payer: Medicare Other | Source: Ambulatory Visit | Attending: Internal Medicine | Admitting: Internal Medicine

## 2020-02-09 ENCOUNTER — Encounter (HOSPITAL_COMMUNITY): Payer: Medicare Other

## 2020-02-09 ENCOUNTER — Other Ambulatory Visit (HOSPITAL_COMMUNITY): Payer: Medicare Other

## 2020-02-09 DIAGNOSIS — Z01812 Encounter for preprocedural laboratory examination: Secondary | ICD-10-CM | POA: Diagnosis present

## 2020-02-09 DIAGNOSIS — Z20822 Contact with and (suspected) exposure to covid-19: Secondary | ICD-10-CM | POA: Insufficient documentation

## 2020-02-09 LAB — SARS CORONAVIRUS 2 (TAT 6-24 HRS): SARS Coronavirus 2: NEGATIVE

## 2020-02-10 NOTE — Progress Notes (Signed)
Instructed patient on the following items: Arrival time 1230 Nothing to eat or drink after midnight No meds AM of procedure Responsible person to drive you home and stay with you for 24 hrs Wash with special soap night before and morning of procedure  

## 2020-02-11 ENCOUNTER — Ambulatory Visit (HOSPITAL_COMMUNITY)
Admission: RE | Admit: 2020-02-11 | Discharge: 2020-02-12 | Disposition: A | Discharge status: Home / Self Care | Payer: No Typology Code available for payment source | Attending: Internal Medicine | Admitting: Internal Medicine

## 2020-02-11 ENCOUNTER — Other Ambulatory Visit: Payer: Self-pay

## 2020-02-11 ENCOUNTER — Encounter (HOSPITAL_COMMUNITY): Payer: Self-pay | Admitting: Internal Medicine

## 2020-02-11 ENCOUNTER — Encounter (HOSPITAL_COMMUNITY)
Admission: RE | Disposition: A | Discharge status: Home / Self Care | Payer: Self-pay | Source: Home / Self Care | Attending: Internal Medicine

## 2020-02-11 DIAGNOSIS — N1832 Chronic kidney disease, stage 3b: Secondary | ICD-10-CM | POA: Diagnosis not present

## 2020-02-11 DIAGNOSIS — I129 Hypertensive chronic kidney disease with stage 1 through stage 4 chronic kidney disease, or unspecified chronic kidney disease: Secondary | ICD-10-CM | POA: Insufficient documentation

## 2020-02-11 DIAGNOSIS — Z79899 Other long term (current) drug therapy: Secondary | ICD-10-CM | POA: Diagnosis not present

## 2020-02-11 DIAGNOSIS — R001 Bradycardia, unspecified: Secondary | ICD-10-CM | POA: Diagnosis not present

## 2020-02-11 DIAGNOSIS — Z87891 Personal history of nicotine dependence: Secondary | ICD-10-CM | POA: Diagnosis not present

## 2020-02-11 DIAGNOSIS — I441 Atrioventricular block, second degree: Secondary | ICD-10-CM | POA: Insufficient documentation

## 2020-02-11 DIAGNOSIS — I428 Other cardiomyopathies: Secondary | ICD-10-CM | POA: Insufficient documentation

## 2020-02-11 DIAGNOSIS — Z8249 Family history of ischemic heart disease and other diseases of the circulatory system: Secondary | ICD-10-CM | POA: Insufficient documentation

## 2020-02-11 DIAGNOSIS — Z7902 Long term (current) use of antithrombotics/antiplatelets: Secondary | ICD-10-CM | POA: Insufficient documentation

## 2020-02-11 DIAGNOSIS — Z7982 Long term (current) use of aspirin: Secondary | ICD-10-CM | POA: Insufficient documentation

## 2020-02-11 DIAGNOSIS — Z959 Presence of cardiac and vascular implant and graft, unspecified: Secondary | ICD-10-CM

## 2020-02-11 DIAGNOSIS — R0602 Shortness of breath: Secondary | ICD-10-CM | POA: Diagnosis not present

## 2020-02-11 HISTORY — PX: BIV PACEMAKER INSERTION CRT-P: EP1199

## 2020-02-11 LAB — GLUCOSE, CAPILLARY: Glucose-Capillary: 85 mg/dL (ref 70–99)

## 2020-02-11 SURGERY — BIV PACEMAKER INSERTION CRT-P

## 2020-02-11 MED ORDER — HEPARIN (PORCINE) IN NACL 1000-0.9 UT/500ML-% IV SOLN
INTRAVENOUS | Status: DC | PRN
  Administered 2020-02-11: 500 mL

## 2020-02-11 MED ORDER — CARVEDILOL 3.125 MG PO TABS
3.1250 mg | ORAL_TABLET | Freq: Two times a day (BID) | ORAL | Status: DC
  Administered 2020-02-11 – 2020-02-12 (×2): 3.125 mg via ORAL
  Filled 2020-02-11 (×2): qty 1

## 2020-02-11 MED ORDER — LIDOCAINE HCL (PF) 1 % IJ SOLN
INTRAMUSCULAR | Status: AC
  Filled 2020-02-11: qty 60

## 2020-02-11 MED ORDER — LIDOCAINE HCL (PF) 1 % IJ SOLN
INTRAMUSCULAR | Status: DC | PRN
  Administered 2020-02-11: 60 mL

## 2020-02-11 MED ORDER — SODIUM CHLORIDE 0.9 % IV SOLN: 250.0000 mL | INTRAVENOUS | Status: DC | PRN

## 2020-02-11 MED ORDER — CEFAZOLIN SODIUM-DEXTROSE 2-4 GM/100ML-% IV SOLN
2.0000 g | INTRAVENOUS | Status: AC
  Administered 2020-02-11: 2 g via INTRAVENOUS

## 2020-02-11 MED ORDER — SODIUM CHLORIDE 0.9% FLUSH: 3.0000 mL | INTRAVENOUS | Status: DC | PRN

## 2020-02-11 MED ORDER — TAMSULOSIN HCL 0.4 MG PO CAPS
0.4000 mg | ORAL_CAPSULE | Freq: Every day | ORAL | Status: DC
  Administered 2020-02-11 – 2020-02-12 (×2): 0.4 mg via ORAL
  Filled 2020-02-11 (×2): qty 1

## 2020-02-11 MED ORDER — CEFAZOLIN SODIUM-DEXTROSE 1-4 GM/50ML-% IV SOLN
1.0000 g | Freq: Two times a day (BID) | INTRAVENOUS | Status: DC
  Administered 2020-02-12: 1 g via INTRAVENOUS
  Filled 2020-02-11 (×3): qty 50

## 2020-02-11 MED ORDER — SODIUM CHLORIDE 0.9 % IV SOLN: INTRAVENOUS | Status: DC

## 2020-02-11 MED ORDER — IOHEXOL 350 MG/ML SOLN
INTRAVENOUS | Status: DC | PRN
  Administered 2020-02-11: 15 mL

## 2020-02-11 MED ORDER — ALBUTEROL SULFATE (2.5 MG/3ML) 0.083% IN NEBU: 3.0000 mL | INHALATION_SOLUTION | Freq: Four times a day (QID) | RESPIRATORY_TRACT | Status: DC | PRN

## 2020-02-11 MED ORDER — HYDROCODONE-ACETAMINOPHEN 5-325 MG PO TABS: 1.0000 | ORAL_TABLET | ORAL | Status: DC | PRN

## 2020-02-11 MED ORDER — CHLORHEXIDINE GLUCONATE 4 % EX LIQD: 4.0000 "application " | Freq: Once | CUTANEOUS | Status: DC

## 2020-02-11 MED ORDER — ALLOPURINOL 300 MG PO TABS
300.0000 mg | ORAL_TABLET | Freq: Every day | ORAL | Status: DC
  Administered 2020-02-11 – 2020-02-12 (×2): 300 mg via ORAL
  Filled 2020-02-11 (×2): qty 1

## 2020-02-11 MED ORDER — ALOGLIPTIN BENZOATE 12.5 MG PO TABS: 6.2500 mg | ORAL_TABLET | Freq: Every day | ORAL | Status: DC

## 2020-02-11 MED ORDER — ASPIRIN EC 81 MG PO TBEC
81.0000 mg | DELAYED_RELEASE_TABLET | Freq: Every day | ORAL | Status: DC
  Administered 2020-02-12: 81 mg via ORAL
  Filled 2020-02-11: qty 1

## 2020-02-11 MED ORDER — NORTRIPTYLINE HCL 10 MG PO CAPS
10.0000 mg | ORAL_CAPSULE | Freq: Every day | ORAL | Status: DC
  Administered 2020-02-11: 10 mg via ORAL
  Filled 2020-02-11 (×2): qty 1

## 2020-02-11 MED ORDER — CEFAZOLIN SODIUM-DEXTROSE 2-4 GM/100ML-% IV SOLN
INTRAVENOUS | Status: AC
  Filled 2020-02-11: qty 100

## 2020-02-11 MED ORDER — AMLODIPINE BESYLATE 10 MG PO TABS
10.0000 mg | ORAL_TABLET | Freq: Every day | ORAL | Status: DC
  Administered 2020-02-11 – 2020-02-12 (×2): 10 mg via ORAL
  Filled 2020-02-11 (×2): qty 1

## 2020-02-11 MED ORDER — ACETAMINOPHEN 325 MG PO TABS: 325.0000 mg | ORAL_TABLET | ORAL | Status: DC | PRN

## 2020-02-11 MED ORDER — SODIUM CHLORIDE 0.9% FLUSH
3.0000 mL | Freq: Two times a day (BID) | INTRAVENOUS | Status: DC
  Administered 2020-02-11: 3 mL via INTRAVENOUS

## 2020-02-11 MED ORDER — SODIUM CHLORIDE 0.9 % IV SOLN
80.0000 mg | INTRAVENOUS | Status: AC
  Administered 2020-02-11: 80 mg
  Filled 2020-02-11: qty 2

## 2020-02-11 MED ORDER — CLOPIDOGREL BISULFATE 75 MG PO TABS
75.0000 mg | ORAL_TABLET | Freq: Every day | ORAL | Status: DC
  Administered 2020-02-12: 75 mg via ORAL
  Filled 2020-02-11: qty 1

## 2020-02-11 MED ORDER — HEPARIN (PORCINE) IN NACL 1000-0.9 UT/500ML-% IV SOLN
INTRAVENOUS | Status: AC
  Filled 2020-02-11: qty 500

## 2020-02-11 MED ORDER — SODIUM CHLORIDE 0.9 % IV SOLN
INTRAVENOUS | Status: AC
  Filled 2020-02-11: qty 2

## 2020-02-11 MED ORDER — ONDANSETRON HCL 4 MG/2ML IJ SOLN: 4.0000 mg | Freq: Four times a day (QID) | INTRAMUSCULAR | Status: DC | PRN

## 2020-02-11 SURGICAL SUPPLY — 16 items
ADAPTER SEALING SSA-EW-09 (ADAPTER) ×2 IMPLANT
CABLE SURGICAL S-101-97-12 (CABLE) ×2 IMPLANT
CATH ATTAIN COM SURV 6250V-MB2 (CATHETERS) ×2 IMPLANT
CATH CPS DIRECT 135 DS2C020 (CATHETERS) ×2 IMPLANT
CATH CPS QUART CN DS2N029-65 (CATHETERS) ×2 IMPLANT
CATH JOSEPH QUAD ALLRED 6F REP (CATHETERS) ×2 IMPLANT
CATH QUAD JOSEPHSON 5FR (CATHETERS) ×2 IMPLANT
KIT MICROPUNCTURE NIT STIFF (SHEATH) ×2 IMPLANT
LEAD TENDRIL MRI 52CM LPA1200M (Lead) ×2 IMPLANT
LEAD TENDRIL MRI 58CM LPA1200M (Lead) ×2 IMPLANT
PACEMAKER ASSURITY DR-RF (Pacemaker) ×2 IMPLANT
PAD PRO RADIOLUCENT 2001M-C (PAD) ×2 IMPLANT
SHEATH 8FR PRELUDE SNAP 13 (SHEATH) ×4 IMPLANT
SHEATH 9.5FR PRELUDE SNAP 13 (SHEATH) ×2 IMPLANT
TRAY PACEMAKER INSERTION (PACKS) ×2 IMPLANT
WIRE HI TORQ VERSACORE-J 145CM (WIRE) ×2 IMPLANT

## 2020-02-11 NOTE — Interval H&P Note (Signed)
History and Physical Interval Note:  02/11/2020 12:51 PM  Peter James.  has presented today for surgery, with the diagnosis of bradycardiac.  The various methods of treatment have been discussed with the patient and family. After consideration of risks, benefits and other options for treatment, the patient has consented to  Procedure(s): BIV PACEMAKER INSERTION CRT-P (N/A) as a surgical intervention.  The patient's history has been reviewed, patient examined, no change in status, stable for surgery.  I have reviewed the patient's chart and labs.  Questions were answered to the patient's satisfaction.    Risks, benefits, alternatives to biv pacemaker implantation were discussed in detail with the patient today. The patient understands that the risks include but are not limited to bleeding, infection, pneumothorax, perforation, tamponade, vascular damage, renal failure, MI, stroke, death,  and lead dislodgement and wishes to proceed.    Thompson Grayer

## 2020-02-11 NOTE — Progress Notes (Signed)
PHARMACY NOTE:  ANTIMICROBIAL RENAL DOSAGE ADJUSTMENT  Current antimicrobial regimen includes a mismatch between antimicrobial dosage and estimated renal function.  As per policy approved by the Pharmacy & Therapeutics and Medical Executive Committees, the antimicrobial dosage will be adjusted accordingly.  Current antimicrobial dosage:  Ancef 1gm IV Q6H x 3 doses  Indication: surgical prophylaxis  Renal Function:  Estimated Creatinine Clearance: 29.8 mL/min (A) (by C-G formula based on SCr of 1.79 mg/dL (H)). []      On intermittent HD, scheduled: []      On CRRT    Antimicrobial dosage has been changed to:  Ancef 1gm IV Q12H x 2 doses   Cortana Vanderford D. Mina Marble, PharmD, BCPS, [place] 02/11/2020, 4:21 PM

## 2020-02-12 ENCOUNTER — Ambulatory Visit (HOSPITAL_COMMUNITY): Payer: No Typology Code available for payment source

## 2020-02-12 DIAGNOSIS — I441 Atrioventricular block, second degree: Secondary | ICD-10-CM

## 2020-02-12 DIAGNOSIS — I428 Other cardiomyopathies: Secondary | ICD-10-CM | POA: Diagnosis not present

## 2020-02-12 DIAGNOSIS — R001 Bradycardia, unspecified: Secondary | ICD-10-CM | POA: Diagnosis not present

## 2020-02-12 DIAGNOSIS — I129 Hypertensive chronic kidney disease with stage 1 through stage 4 chronic kidney disease, or unspecified chronic kidney disease: Secondary | ICD-10-CM | POA: Diagnosis not present

## 2020-02-12 NOTE — Discharge Summary (Signed)
ELECTROPHYSIOLOGY PROCEDURE DISCHARGE SUMMARY    Patient ID: Peter Bunn.,  MRN: [id number], DOB/AGE: Jul 05, 1925 86 y.o.  Admit date: 02/11/2020 Discharge date: 02/12/2020  PCP:  [name], MD       Cardiologist:  Dr Johnsie Cancel Primary Electrophysiologist: Thompson Grayer, MD       Primary Discharge Diagnosis:  Mobitz II second degree AV block  Secondary Discharge Diagnosis:  Hypertension Chronic renal insufficiency (Stage IIIb)  No Known Allergies   Procedures This Admission:  1.  Implantation of a St Jude Medical Assurity MRI conditional  dual-chamber pacemaker for symptomatic mobitz II second degree AV block on 02/11/20  by Dr Rayann Heman.  There were no immediate post procedure complications. 2.  CXR on 02/12/20 demonstrated no pneumothorax status post device implantation.   Brief HPI: Peter Vallery. is a 86 y.o. male was referred to electrophysiology in the outpatient setting for consideration of PPM implantation.  Past medical history includes HTN and stage IIIb renal failure.   Risks, benefits, and alternatives to PPM implantation were reviewed with the patient who wished to proceed.   Hospital Course:  The patient was admitted and underwent implantation of a dual chamber pacemaker with details as outlined above. He was monitored on telemetry overnight which demonstrated sinus rhythm with V pacing.  Left chest was without hematoma or ecchymosis.  The device was interrogated and found to be functioning normally.  CXR was obtained and demonstrated no pneumothorax status post device implantation.  Wound care, arm mobility, and restrictions were reviewed with the patient.  The patient was examined and considered stable for discharge to home.    Physical Exam: Vitals:   02/11/20 2036 02/11/20 2349 02/12/20 0429 02/12/20 0829  BP: (!) 155/63 136/80 (!) 143/73 128/76  Pulse: 79 78 86 90  Resp: 18 18 18    Temp: 98.6 F (37 C) 99.1 F (37.3 C) 99.1 F (37.3 C)    TempSrc: Oral Oral Oral   SpO2: 96% 100% 98% 100%  Weight:      Height:        GEN- The patient is elderly appearing, alert and oriented x 3 today.  HEENT: normocephalic, atraumatic; sclera clear, conjunctiva pink; hearing intact; oropharynx clear; neck supple, no JVP Lungs-   normal work of breathing.  Heart- Regular rate and rhythm  GI- soft  Extremities- no clubbing, cyanosis, or edema  MS- no significant deformity or atrophy Skin- warm and dry, no rash or lesion, left chest without hematoma/ecchymosis Psych- euthymic mood, full affect Neuro- strength and sensation are intact   Labs:   Lab Results  Component Value Date   WBC 15.7 (H) 01/29/2020   HGB 13.0 01/29/2020   HCT 39.8 01/29/2020   MCV 90 01/29/2020   PLT 415 01/29/2020   No results for input(s): NA, K, CL, CO2, BUN, CREATININE, CALCIUM, PROT, BILITOT, ALKPHOS, ALT, AST, GLUCOSE in the last 168 hours.  Invalid input(s): LABALBU  Discharge Medications:  Allergies as of 02/12/2020   No Known Allergies     Medication List    TAKE these medications   albuterol 108 (90 Base) MCG/ACT inhaler Commonly known as: VENTOLIN HFA Inhale 2 puffs into the lungs every 6 (six) hours as needed for wheezing or shortness of breath.   allopurinol 300 MG tablet Commonly known as: ZYLOPRIM Take 300 mg by mouth daily.   Alogliptin Benzoate 12.5 MG Tabs Take 6.25 mg by mouth daily.   amLODipine 10 MG tablet Commonly known as: NORVASC  Take 1 tablet (10 mg total) by mouth daily.   aspirin EC 81 MG tablet Take 81 mg by mouth daily. Swallow whole.   atorvastatin 10 MG tablet Commonly known as: LIPITOR Take 1 tablet (10 mg total) by mouth every evening.   carvedilol 3.125 MG tablet Commonly known as: COREG Take 3.125 mg by mouth 2 (two) times daily with a meal.   cholecalciferol 25 MCG (1000 UNIT) tablet Commonly known as: VITAMIN D3 Take 1,000 Units by mouth daily.   clopidogrel 75 MG tablet Commonly known as:  Plavix Take 1 tablet (75 mg total) by mouth daily.   clotrimazole 1 % cream Commonly known as: LOTRIMIN Apply 1 application topically once a week.   nortriptyline 10 MG capsule Commonly known as: PAMELOR Take 10 mg by mouth at bedtime.   polyethylene glycol 17 g packet Commonly known as: MIRALAX / GLYCOLAX Take 17 g by mouth daily as needed for mild constipation.   sildenafil 100 MG tablet Commonly known as: VIAGRA Take 100 mg by mouth daily as needed for erectile dysfunction.   tamsulosin 0.4 MG Caps capsule Commonly known as: FLOMAX Take 0.4 mg by mouth daily.       Disposition:    Follow-up Information    [facility] Follow up.   Specialty: Cardiology Why: 02/24/2020 @ 12:00PM (noon), wound check visit Contact information: [address] [place]       Thompson Grayer, MD Follow up.   Specialty: Cardiology Why: 05/17/2020 @ 2:15PM Contact information: [place] [address] [phone]               Duration of Discharge Encounter: Greater than 30 minutes including physician time.  Army Fossa MD 02/12/2020 10:04 AM

## 2020-02-12 NOTE — Discharge Instructions (Signed)
    Supplemental Discharge Instructions for  Pacemaker Patients   Activity No heavy lifting or vigorous activity with your left/right arm for 6 to 8 weeks.  Do not raise your left/right arm above your head for one week.  Gradually raise your affected arm as drawn below.             02/15/20                   02/16/2020              02/17/2020            02/18/2020 __  NO DRIVING for  1 week   ; you may begin driving on  70/11/6436   .  WOUND CARE - Keep the wound area clean and dry.  Do not get this area wet , no showersuntil cleared to at your wound check visit The tape/steri-strips on your wound will fall off; do not pull them off.  No bandage is needed on the site.  DO  NOT apply any creams, oils, or ointments to the wound area. - If you notice any drainage or discharge from the wound, any swelling or bruising at the site, or you develop a fever > 101? F after you are discharged home, call the office at once.  Special Instructions - You are still able to use cellular telephones; use the ear opposite the side where you have your pacemaker/defibrillator.  Avoid carrying your cellular phone near your device. - When traveling through airports, show security personnel your identification card to avoid being screened in the metal detectors.  Ask the security personnel to use the hand wand. - Avoid electrical appliances that are in poor condition or are not properly grounded. - Microwave ovens are safe to be near or to operate.

## 2020-02-13 ENCOUNTER — Encounter (HOSPITAL_COMMUNITY): Payer: Self-pay | Admitting: Internal Medicine

## 2020-02-20 ENCOUNTER — Encounter (HOSPITAL_COMMUNITY): Payer: Medicare Other

## 2020-02-20 ENCOUNTER — Other Ambulatory Visit (HOSPITAL_COMMUNITY)
Admission: RE | Admit: 2020-02-20 | Discharge: 2020-02-20 | Disposition: A | Discharge status: Home / Self Care | Payer: No Typology Code available for payment source | Source: Ambulatory Visit | Attending: Vascular Surgery | Admitting: Vascular Surgery

## 2020-02-20 DIAGNOSIS — Z01812 Encounter for preprocedural laboratory examination: Secondary | ICD-10-CM | POA: Insufficient documentation

## 2020-02-20 DIAGNOSIS — Z20822 Contact with and (suspected) exposure to covid-19: Secondary | ICD-10-CM | POA: Insufficient documentation

## 2020-02-20 LAB — SARS CORONAVIRUS 2 (TAT 6-24 HRS): SARS Coronavirus 2: NEGATIVE

## 2020-02-23 ENCOUNTER — Institutional Professional Consult (permissible substitution): Payer: Medicare Other | Admitting: Internal Medicine

## 2020-02-23 ENCOUNTER — Encounter (HOSPITAL_COMMUNITY)
Admission: RE | Disposition: A | Discharge status: Home Health | Payer: Self-pay | Source: Home / Self Care | Attending: Vascular Surgery

## 2020-02-23 ENCOUNTER — Inpatient Hospital Stay (HOSPITAL_COMMUNITY)
Admission: RE | Admit: 2020-02-23 | Discharge: 2020-02-26 | DRG: 272 | Disposition: A | Discharge status: Home Health | Payer: No Typology Code available for payment source | Attending: Vascular Surgery | Admitting: Vascular Surgery

## 2020-02-23 DIAGNOSIS — Z950 Presence of cardiac pacemaker: Secondary | ICD-10-CM | POA: Diagnosis not present

## 2020-02-23 DIAGNOSIS — E785 Hyperlipidemia, unspecified: Secondary | ICD-10-CM | POA: Diagnosis present

## 2020-02-23 DIAGNOSIS — E1140 Type 2 diabetes mellitus with diabetic neuropathy, unspecified: Secondary | ICD-10-CM | POA: Diagnosis present

## 2020-02-23 DIAGNOSIS — E1152 Type 2 diabetes mellitus with diabetic peripheral angiopathy with gangrene: Principal | ICD-10-CM | POA: Diagnosis present

## 2020-02-23 DIAGNOSIS — Z89412 Acquired absence of left great toe: Secondary | ICD-10-CM

## 2020-02-23 DIAGNOSIS — I998 Other disorder of circulatory system: Secondary | ICD-10-CM

## 2020-02-23 DIAGNOSIS — R0609 Other forms of dyspnea: Secondary | ICD-10-CM

## 2020-02-23 DIAGNOSIS — J449 Chronic obstructive pulmonary disease, unspecified: Secondary | ICD-10-CM | POA: Diagnosis present

## 2020-02-23 DIAGNOSIS — Z7982 Long term (current) use of aspirin: Secondary | ICD-10-CM | POA: Diagnosis not present

## 2020-02-23 DIAGNOSIS — I2510 Atherosclerotic heart disease of native coronary artery without angina pectoris: Secondary | ICD-10-CM | POA: Diagnosis present

## 2020-02-23 DIAGNOSIS — Z8551 Personal history of malignant neoplasm of bladder: Secondary | ICD-10-CM

## 2020-02-23 DIAGNOSIS — Z87891 Personal history of nicotine dependence: Secondary | ICD-10-CM | POA: Diagnosis not present

## 2020-02-23 DIAGNOSIS — I739 Peripheral vascular disease, unspecified: Secondary | ICD-10-CM | POA: Diagnosis present

## 2020-02-23 DIAGNOSIS — Z8249 Family history of ischemic heart disease and other diseases of the circulatory system: Secondary | ICD-10-CM | POA: Diagnosis not present

## 2020-02-23 DIAGNOSIS — G4709 Other insomnia: Secondary | ICD-10-CM | POA: Diagnosis present

## 2020-02-23 DIAGNOSIS — Z9841 Cataract extraction status, right eye: Secondary | ICD-10-CM | POA: Diagnosis not present

## 2020-02-23 DIAGNOSIS — Z9842 Cataract extraction status, left eye: Secondary | ICD-10-CM | POA: Diagnosis not present

## 2020-02-23 DIAGNOSIS — Z79899 Other long term (current) drug therapy: Secondary | ICD-10-CM

## 2020-02-23 DIAGNOSIS — R0600 Dyspnea, unspecified: Secondary | ICD-10-CM

## 2020-02-23 DIAGNOSIS — Z7984 Long term (current) use of oral hypoglycemic drugs: Secondary | ICD-10-CM | POA: Diagnosis not present

## 2020-02-23 DIAGNOSIS — I96 Gangrene, not elsewhere classified: Secondary | ICD-10-CM | POA: Diagnosis not present

## 2020-02-23 DIAGNOSIS — I10 Essential (primary) hypertension: Secondary | ICD-10-CM | POA: Diagnosis present

## 2020-02-23 DIAGNOSIS — Z20822 Contact with and (suspected) exposure to covid-19: Secondary | ICD-10-CM | POA: Diagnosis present

## 2020-02-23 HISTORY — PX: PERIPHERAL VASCULAR BALLOON ANGIOPLASTY: CATH118281

## 2020-02-23 HISTORY — PX: PERIPHERAL VASCULAR ATHERECTOMY: CATH118256

## 2020-02-23 HISTORY — PX: LOWER EXTREMITY ANGIOGRAPHY: CATH118251

## 2020-02-23 LAB — CBC
HCT: 35 % — ABNORMAL LOW (ref 39.0–52.0)
Hemoglobin: 11.4 g/dL — ABNORMAL LOW (ref 13.0–17.0)
MCH: 29.1 pg (ref 26.0–34.0)
MCHC: 32.6 g/dL (ref 30.0–36.0)
MCV: 89.3 fL (ref 80.0–100.0)
Platelets: 423 10*3/uL — ABNORMAL HIGH (ref 150–400)
RBC: 3.92 MIL/uL — ABNORMAL LOW (ref 4.22–5.81)
RDW: 15.9 % — ABNORMAL HIGH (ref 11.5–15.5)
WBC: 20.4 10*3/uL — ABNORMAL HIGH (ref 4.0–10.5)
nRBC: 0 % (ref 0.0–0.2)

## 2020-02-23 LAB — POCT I-STAT, CHEM 8
BUN: 39 mg/dL — ABNORMAL HIGH (ref 8–23)
Calcium, Ion: 1.23 mmol/L (ref 1.15–1.40)
Chloride: 103 mmol/L (ref 98–111)
Creatinine, Ser: 1.8 mg/dL — ABNORMAL HIGH (ref 0.61–1.24)
Glucose, Bld: 125 mg/dL — ABNORMAL HIGH (ref 70–99)
HCT: 37 % — ABNORMAL LOW (ref 39.0–52.0)
Hemoglobin: 12.6 g/dL — ABNORMAL LOW (ref 13.0–17.0)
Potassium: 3.9 mmol/L (ref 3.5–5.1)
Sodium: 137 mmol/L (ref 135–145)
TCO2: 23 mmol/L (ref 22–32)

## 2020-02-23 LAB — CREATININE, SERUM
Creatinine, Ser: 1.54 mg/dL — ABNORMAL HIGH (ref 0.61–1.24)
GFR, Estimated: 42 mL/min — ABNORMAL LOW (ref >60)

## 2020-02-23 LAB — SURGICAL PCR SCREEN
MRSA, PCR: NEGATIVE
Staphylococcus aureus: NEGATIVE

## 2020-02-23 SURGERY — PERIPHERAL VASCULAR BALLOON ANGIOPLASTY
Anesthesia: LOCAL | Laterality: Left

## 2020-02-23 MED ORDER — LABETALOL HCL 5 MG/ML IV SOLN: 10.0000 mg | INTRAVENOUS | Status: DC | PRN

## 2020-02-23 MED ORDER — HEPARIN SODIUM (PORCINE) 1000 UNIT/ML IJ SOLN
INTRAMUSCULAR | Status: DC | PRN
  Administered 2020-02-23: 10000 [IU] via INTRAVENOUS

## 2020-02-23 MED ORDER — ONDANSETRON HCL 4 MG/2ML IJ SOLN: 4.0000 mg | Freq: Four times a day (QID) | INTRAMUSCULAR | Status: DC | PRN

## 2020-02-23 MED ORDER — HEPARIN SODIUM (PORCINE) 5000 UNIT/ML IJ SOLN
5000.0000 [IU] | Freq: Three times a day (TID) | INTRAMUSCULAR | Status: DC
  Filled 2020-02-23: qty 1

## 2020-02-23 MED ORDER — ATORVASTATIN CALCIUM 10 MG PO TABS
10.0000 mg | ORAL_TABLET | Freq: Every evening | ORAL | Status: DC
  Administered 2020-02-23 – 2020-02-25 (×3): 10 mg via ORAL
  Filled 2020-02-23 (×3): qty 1

## 2020-02-23 MED ORDER — ALBUTEROL SULFATE HFA 108 (90 BASE) MCG/ACT IN AERS: 2.0000 | INHALATION_SPRAY | Freq: Four times a day (QID) | RESPIRATORY_TRACT | Status: DC | PRN

## 2020-02-23 MED ORDER — FENTANYL CITRATE (PF) 100 MCG/2ML IJ SOLN
INTRAMUSCULAR | Status: AC
  Filled 2020-02-23: qty 2

## 2020-02-23 MED ORDER — HEPARIN (PORCINE) IN NACL 1000-0.9 UT/500ML-% IV SOLN
INTRAVENOUS | Status: AC
  Filled 2020-02-23: qty 1000

## 2020-02-23 MED ORDER — IODIXANOL 320 MG/ML IV SOLN
INTRAVENOUS | Status: DC | PRN
  Administered 2020-02-23: 30 mL

## 2020-02-23 MED ORDER — SODIUM CHLORIDE 0.9 % IV SOLN: INTRAVENOUS | Status: AC

## 2020-02-23 MED ORDER — HEPARIN SODIUM (PORCINE) 5000 UNIT/ML IJ SOLN
5000.0000 [IU] | Freq: Three times a day (TID) | INTRAMUSCULAR | Status: DC
  Administered 2020-02-23 – 2020-02-26 (×7): 5000 [IU] via SUBCUTANEOUS
  Filled 2020-02-23 (×6): qty 1

## 2020-02-23 MED ORDER — VITAMIN D 25 MCG (1000 UNIT) PO TABS
1000.0000 [IU] | ORAL_TABLET | Freq: Every day | ORAL | Status: DC
  Administered 2020-02-23 – 2020-02-26 (×4): 1000 [IU] via ORAL
  Filled 2020-02-23 (×4): qty 1

## 2020-02-23 MED ORDER — SODIUM CHLORIDE 0.9 % IV SOLN: 250.0000 mL | INTRAVENOUS | Status: DC | PRN

## 2020-02-23 MED ORDER — LIDOCAINE HCL (PF) 1 % IJ SOLN
INTRAMUSCULAR | Status: AC
  Filled 2020-02-23: qty 30

## 2020-02-23 MED ORDER — CARVEDILOL 3.125 MG PO TABS
3.1250 mg | ORAL_TABLET | Freq: Two times a day (BID) | ORAL | Status: DC
  Administered 2020-02-23 – 2020-02-26 (×6): 3.125 mg via ORAL
  Filled 2020-02-23 (×6): qty 1

## 2020-02-23 MED ORDER — AMLODIPINE BESYLATE 10 MG PO TABS
10.0000 mg | ORAL_TABLET | Freq: Every day | ORAL | Status: DC
  Administered 2020-02-23 – 2020-02-26 (×4): 10 mg via ORAL
  Filled 2020-02-23 (×4): qty 1

## 2020-02-23 MED ORDER — HEPARIN (PORCINE) IN NACL 1000-0.9 UT/500ML-% IV SOLN
INTRAVENOUS | Status: DC | PRN
  Administered 2020-02-23: 500 mL

## 2020-02-23 MED ORDER — ACETAMINOPHEN 325 MG PO TABS
650.0000 mg | ORAL_TABLET | ORAL | Status: DC | PRN
  Administered 2020-02-23 – 2020-02-25 (×2): 650 mg via ORAL
  Filled 2020-02-23 (×2): qty 2

## 2020-02-23 MED ORDER — POLYETHYLENE GLYCOL 3350 17 G PO PACK: 17.0000 g | PACK | Freq: Every day | ORAL | Status: DC | PRN

## 2020-02-23 MED ORDER — SODIUM CHLORIDE 0.9% FLUSH
3.0000 mL | Freq: Two times a day (BID) | INTRAVENOUS | Status: DC
  Administered 2020-02-23 – 2020-02-26 (×6): 3 mL via INTRAVENOUS

## 2020-02-23 MED ORDER — FENTANYL CITRATE (PF) 100 MCG/2ML IJ SOLN
INTRAMUSCULAR | Status: DC | PRN
  Administered 2020-02-23: 50 ug via INTRAVENOUS

## 2020-02-23 MED ORDER — SODIUM CHLORIDE 0.9% FLUSH: 3.0000 mL | INTRAVENOUS | Status: DC | PRN

## 2020-02-23 MED ORDER — LIDOCAINE HCL (PF) 1 % IJ SOLN
INTRAMUSCULAR | Status: DC | PRN
  Administered 2020-02-23: 15 mL

## 2020-02-23 MED ORDER — OXYCODONE HCL 5 MG PO TABS: 5.0000 mg | ORAL_TABLET | ORAL | Status: DC | PRN

## 2020-02-23 MED ORDER — ASPIRIN EC 81 MG PO TBEC
81.0000 mg | DELAYED_RELEASE_TABLET | Freq: Every day | ORAL | Status: DC
  Administered 2020-02-23 – 2020-02-26 (×4): 81 mg via ORAL
  Filled 2020-02-23 (×4): qty 1

## 2020-02-23 MED ORDER — ALLOPURINOL 300 MG PO TABS
300.0000 mg | ORAL_TABLET | Freq: Every day | ORAL | Status: DC
  Administered 2020-02-23 – 2020-02-26 (×4): 300 mg via ORAL
  Filled 2020-02-23 (×4): qty 1

## 2020-02-23 MED ORDER — TAMSULOSIN HCL 0.4 MG PO CAPS
0.4000 mg | ORAL_CAPSULE | Freq: Every day | ORAL | Status: DC
  Administered 2020-02-23 – 2020-02-26 (×4): 0.4 mg via ORAL
  Filled 2020-02-23 (×4): qty 1

## 2020-02-23 MED ORDER — MORPHINE SULFATE (PF) 2 MG/ML IV SOLN: 2.0000 mg | INTRAVENOUS | Status: DC | PRN

## 2020-02-23 MED ORDER — SODIUM CHLORIDE 0.9 % IV SOLN: INTRAVENOUS | Status: DC

## 2020-02-23 MED ORDER — NORTRIPTYLINE HCL 10 MG PO CAPS
10.0000 mg | ORAL_CAPSULE | Freq: Every day | ORAL | Status: DC
  Administered 2020-02-23 – 2020-02-25 (×3): 10 mg via ORAL
  Filled 2020-02-23 (×4): qty 1

## 2020-02-23 MED ORDER — HEPARIN SODIUM (PORCINE) 1000 UNIT/ML IJ SOLN
INTRAMUSCULAR | Status: AC
  Filled 2020-02-23: qty 1

## 2020-02-23 MED ORDER — ALOGLIPTIN BENZOATE 12.5 MG PO TABS: 6.2500 mg | ORAL_TABLET | Freq: Every day | ORAL | Status: DC

## 2020-02-23 MED ORDER — SILDENAFIL CITRATE 100 MG PO TABS: 100.0000 mg | ORAL_TABLET | Freq: Every day | ORAL | Status: DC | PRN

## 2020-02-23 MED ORDER — CLOPIDOGREL BISULFATE 75 MG PO TABS
75.0000 mg | ORAL_TABLET | Freq: Every day | ORAL | Status: DC
  Administered 2020-02-23 – 2020-02-26 (×4): 75 mg via ORAL
  Filled 2020-02-23 (×4): qty 1

## 2020-02-23 MED ORDER — HYDRALAZINE HCL 20 MG/ML IJ SOLN: 5.0000 mg | INTRAMUSCULAR | Status: DC | PRN

## 2020-02-23 MED ORDER — ALBUTEROL SULFATE HFA 108 (90 BASE) MCG/ACT IN AERS
2.0000 | INHALATION_SPRAY | Freq: Four times a day (QID) | RESPIRATORY_TRACT | Status: DC | PRN
  Filled 2020-02-23: qty 6.7

## 2020-02-23 SURGICAL SUPPLY — 21 items
BALLN COYOTE OTW 2.5X100X150 (BALLOONS) ×3
BALLOON COYOTE OTW 2.5X100X150 (BALLOONS) ×2 IMPLANT
CATH AURYON 4FR ATHEREC 0.9 (CATHETERS) ×3 IMPLANT
CATH NAVICROSS ANGLED 135CM (MICROCATHETER) ×3 IMPLANT
CATH OMNI FLUSH 5F 65CM (CATHETERS) ×3 IMPLANT
CATH QUICKCROSS .018X135CM (MICROCATHETER) ×3 IMPLANT
CATH TEMPO AQUA 5F 100CM (CATHETERS) ×3 IMPLANT
CLOSURE MYNX CONTROL 6F/7F (Vascular Products) ×3 IMPLANT
GLIDEWIRE ADV .035X260CM (WIRE) ×3 IMPLANT
KIT ENCORE 26 ADVANTAGE (KITS) ×3 IMPLANT
KIT MICROPUNCTURE NIT STIFF (SHEATH) ×3 IMPLANT
KIT PV (KITS) ×3 IMPLANT
SHEATH PINNACLE 5F 10CM (SHEATH) ×3 IMPLANT
SHEATH PINNACLE 6F 10CM (SHEATH) ×3 IMPLANT
SHEATH PINNACLE ST 6F 65CM (SHEATH) ×3 IMPLANT
SHEATH PROBE COVER 6X72 (BAG) ×3 IMPLANT
TRANSDUCER W/STOPCOCK (MISCELLANEOUS) ×3 IMPLANT
TRAY PV CATH (CUSTOM PROCEDURE TRAY) ×3 IMPLANT
WIRE BENTSON .035X145CM (WIRE) ×3 IMPLANT
WIRE G V18X300CM (WIRE) ×9 IMPLANT
WIRE SPARTACORE .014X300CM (WIRE) ×3 IMPLANT

## 2020-02-23 NOTE — Progress Notes (Signed)
Pt arrived from cath lab to 4E room 12 after LLE angiogram/arthrectomy w/balloon angioplasty w/Dr. Donzetta Matters.  Telemetry monitor applied and CCMD notified.  CHG bath and skin assessment performed.  Vital signs being sequenced.  Pt oriented to unit and room to include call light and phone. Will continue to monitor.

## 2020-02-23 NOTE — Anesthesia Preprocedure Evaluation (Addendum)
Anesthesia Evaluation  Patient identified by MRN, date of birth, ID band Patient awake    Reviewed: Allergy & Precautions, NPO status , Patient's Chart, lab work & pertinent test results, reviewed documented beta blocker date and time   History of Anesthesia Complications Negative for: history of anesthetic complications  Airway Mallampati: I  TM Distance: >3 FB Neck ROM: Full    Dental  (+) Edentulous Upper, Missing, Dental Advisory Given   Pulmonary shortness of breath, COPD,  COPD inhaler, former smoker,  02/20/2020 SARS coronavirus neg   breath sounds clear to auscultation       Cardiovascular hypertension, Pt. on medications and Pt. on home beta blockers (-) angina+ CAD (non-obstructive) and + Peripheral Vascular Disease  + pacemaker  Rhythm:Irregular Rate:Normal  09/2019 ECHO: EF 40-45%, mild MR   Neuro/Psych neg Seizures negative neurological ROS     GI/Hepatic negative GI ROS, Neg liver ROS,   Endo/Other  diabetes (glu 157), Oral Hypoglycemic Agents  Renal/GU Renal InsufficiencyRenal disease (creat 1.54)     Musculoskeletal   Abdominal   Peds  Hematology  (+) Blood dyscrasia (Hb 10.9), anemia ,   Anesthesia Other Findings   Reproductive/Obstetrics                            Anesthesia Physical Anesthesia Plan  ASA: III  Anesthesia Plan: MAC and Regional   Post-op Pain Management:    Induction:   PONV Risk Score and Plan: 1 and Ondansetron  Airway Management Planned: Natural Airway and Simple Face Mask  Additional Equipment: None  Intra-op Plan:   Post-operative Plan:   Informed Consent: I have reviewed the patients History and Physical, chart, labs and discussed the procedure including the risks, benefits and alternatives for the proposed anesthesia with the patient or authorized representative who has indicated his/her understanding and acceptance.     Dental  advisory given  Plan Discussed with: CRNA and Surgeon  Anesthesia Plan Comments: (Plan routine monitors, ankle block with sedation)       Anesthesia Quick Evaluation

## 2020-02-23 NOTE — Op Note (Signed)
    Patient name: Peter James. MRN: [id number] DOB: 1925/08/30 Sex: male  02/23/2020 Pre-operative Diagnosis: critical left lower extremity ischemia Post-operative diagnosis:  Same Surgeon:  Erlene Quan C. Donzetta Matters, MD Procedure Performed: 1.  Ultrasound-guided cannulation right common femoral artery 2.  Selection of left popliteal artery and left lower extremity angiogram 3.  Laser arthrectomy of left peroneal artery with 0.9 mm [place]on 4.  Plain balloon angioplasty left peroneal artery with 2.5 mm balloon 5.  Selection of left anterior tibial artery 6.  Mynx device closure right common femoral artery   Indications: 86 year old male has a history of bilateral lower extremity critical limb ischemia.  He is undergone revascularization of his right lower extremity is now indicated for the left.  Findings: Below the popliteal artery on the left there was no identifiable posterior tibial artery.  Peroneal artery was initially large and then was fully occluded for approximately 8 cm reconstituted distally.  After intervention there is no residual stenosis or dissection and peroneal arteries is dominant runoff.  There was a long segment greater than 15 cm occlusion of the anterior tibial artery.  I attempted to cross this but could not.  He will undergo left second and possibly third toe amputation tomorrow.  If he does not heal these he could have retrograde access of the left anterior tibial artery.   Procedure:  The patient was identified in the holding area and taken to room 8.  The patient was then placed supine on the table and prepped and draped in the usual sterile fashion.  A time out was called.  Ultrasound was used to evaluate the right common femoral artery.  This was noted to be soft and patent and the areas anesthetized 1% lidocaine cannulated with micropuncture needle followed the wire sheath.  A Bentson wire was placed followed by 5 [place] sheath.  Images saved the permanent record.  We  used Omni catheter Glidewire bandage across the bifurcation and placed a straight catheter to the level of popliteal artery and performed left lower extremity angiography.  With the above findings we then placed a long 6 French sheath patient was fully heparinized.  I used a V 18 wire and quick cross catheter across into the distal peroneal artery.  We then performed laser arthrectomy followed by plain balloon angioplasty.  Completion demonstrated no residual stenosis or dissection.  I then pulled back I used an angled catheter to select the intertibial artery.  I attempted to cross this in antegrade fashion but had no ability given the long segment occlusion.  I elected to exchange for short 6 French sheath deployed a minx device.  He tolerated procedure well any complication peer  Contrast: 30cc  Teodoro Jeffreys C. Donzetta Matters, MD [facility] Office: [phone] Pager: [phone]

## 2020-02-23 NOTE — H&P (Signed)
HPI:  Peter James. is a 86 y.o. male [place] with history of hypertension, diabetes, hyperlipidemia and nonobstructive coronary artery disease.  He does have a history of a left great toe amputation.  Now has wounds on the right medial foot at the first metatarsal head and also has a wound on the second toe on the left.  He denies any history of vascular intervention.  He is a former smoker quit many years ago.  He does walk currently wearing a postop shoe on the right due to the wound.  He is followed by the [facility] for his wounds.  He walks with help of a cane.  He does not have any rest pain or claudication symptoms.      Past Medical History:  Diagnosis Date  . Acute lower GI bleeding    Recurrent: 2011, 2012  . Bladder cancer ([place])    Transitional cell, 2001  . CAD (coronary atherosclerotic disease)    Nonobstructive, 05/2012  . Exertional dyspnea    EF 18-56%, grade 1 diastolic dysfunction, 05/1495  . HLD (hyperlipidemia)   . HTN (hypertension)   . Neuropathy   . Other insomnia   . PAD (peripheral artery disease) ([place])    ABIs, 2009: 0.89 right; 0.99 left  . Type 2 diabetes mellitus ([place])   . Valvular heart disease   . Vertigo         Family History  Problem Relation Age of Onset  . Heart attack Mother   . Heart attack Father   . Heart attack Sister   . Heart attack Sister   . Heart attack Sister   . Heart attack Sister         Past Surgical History:  Procedure Laterality Date  . AMPUTATION Left 10/01/2019   Procedure: LEFT GREAT TOE AMPUTATION AT METATARSOPHALANGEAL JOINT;  Surgeon: [name], MD;  Location: [facility];  Service: Orthopedics;  Laterality: Left;  . CATARACT EXTRACTION     bi lateral  . COLONOSCOPY    . HEMORRHOID SURGERY    . INGUINAL HERNIA REPAIR      Short Social History:  Social History        Tobacco Use  . Smoking status: Former Smoker    Quit date: 04/19/1972    Years  since quitting: 47.7  . Smokeless tobacco: Never Used  Substance Use Topics  . Alcohol use: Never    No Known Allergies  Current Outpatient Medications  Medication Sig Dispense Refill  . albuterol (VENTOLIN HFA) 108 (90 Base) MCG/ACT inhaler Inhale into the lungs every 6 (six) hours as needed for wheezing or shortness of breath.    . allopurinol (ZYLOPRIM) 300 MG tablet Take 300 mg by mouth daily.     . Alogliptin Benzoate 12.5 MG TABS Take 6.25 mg by mouth daily.    [name] amLODipine (NORVASC) 10 MG tablet Take 1 tablet (10 mg total) by mouth daily. 90 tablet 3  . aspirin EC 81 MG tablet Take 81 mg by mouth daily. Swallow whole.    [name] atorvastatin (LIPITOR) 10 MG tablet Take 1 tablet (10 mg total) by mouth every evening. 30 tablet 6  . cholecalciferol (VITAMIN D3) 25 MCG (1000 UNIT) tablet Take 1,000 Units by mouth daily.    . clotrimazole (LOTRIMIN) 1 % cream Apply 1 application topically 2 (two) times daily.    . nortriptyline (PAMELOR) 10 MG capsule Take 20 mg by mouth at bedtime.     [name]  polyethylene glycol (MIRALAX / GLYCOLAX) 17 g packet Take 17 g by mouth daily as needed for mild constipation.    . sildenafil (VIAGRA) 100 MG tablet Take 100 mg by mouth daily as needed for erectile dysfunction.    . tamsulosin (FLOMAX) 0.4 MG CAPS capsule Take 0.4 mg by mouth daily.     . carvedilol (COREG) 3.125 MG tablet Take 1 tablet (3.125 mg total) by mouth 2 (two) times daily with a meal. 60 tablet 0   No current facility-administered medications for this visit.    Review of Systems  Constitutional:  Constitutional negative. HENT: HENT negative.  Eyes: Eyes negative.  Respiratory: Respiratory negative.  Cardiovascular: Cardiovascular negative.  GI: Gastrointestinal negative.  Musculoskeletal: Musculoskeletal negative.  Skin: Positive for wound.  Neurological: Neurological negative. Hematologic: Hematologic/lymphatic negative.  Psychiatric: Psychiatric negative.         Objective:   Vitals:   02/23/20 0713  BP: 139/71  Pulse: 97  Temp: 98.3 F (36.8 C)     Physical Exam Constitutional:      Appearance: Normal appearance.  HENT:     Head: Normocephalic.     Nose:     Comments: Wearing a mask Eyes:     Pupils: Pupils are equal, round, and reactive to light.  Neck:     Vascular: No carotid bruit.  Cardiovascular:     Rate and Rhythm: Regular rhythm.     Pulses:          Femoral pulses are 2+ on the right side and 2+ on the left side.      Popliteal pulses are 0 on the right side and 0 on the left side.  Pulmonary:     Effort: Pulmonary effort is normal.  Abdominal:     General: Abdomen is flat.     Palpations: Abdomen is soft. There is no mass.  Musculoskeletal:        General: Normal range of motion.  Skin:    Comments: Right first metatarsal head 1 cm wound Left half centimeter wound second toe distally in the first toe has been amputated  Neurological:     Mental Status: He is alert.  Psychiatric:        Mood and Affect: Mood normal.        Behavior: Behavior normal.        Thought Content: Thought content normal.        Judgment: Judgment normal.     Data: +-------+-----------+-----------+------------+------------+  ABI/TBIToday's ABIToday's TBIPrevious ABIPrevious TBI  +-------+-----------+-----------+------------+------------+  Right 0.57    0.48    0.7     0.58      +-------+-----------+-----------+------------+------------+  Left  0.71    amp    0.77    wound      +-------+-----------+-----------+------------+------------+         Assessment/Plan:    86 year old male with bilateral foot wounds right appears somewhat worse than the left as the left is only on the tip of the second toe.  Right ABI is lower.  Appears to have both SFA and probable tibial disease from pulse exam and decreased ABIs.  We will begin with angiogram with CO2  today.     Waynetta Sandy MD [facility]

## 2020-02-24 ENCOUNTER — Encounter (HOSPITAL_COMMUNITY)
Admission: RE | Disposition: A | Discharge status: Home Health | Payer: Self-pay | Source: Home / Self Care | Attending: Vascular Surgery

## 2020-02-24 ENCOUNTER — Inpatient Hospital Stay (HOSPITAL_COMMUNITY): Payer: No Typology Code available for payment source | Admitting: Anesthesiology

## 2020-02-24 ENCOUNTER — Ambulatory Visit: Payer: Medicare Other

## 2020-02-24 ENCOUNTER — Encounter (HOSPITAL_COMMUNITY): Payer: Self-pay | Admitting: Vascular Surgery

## 2020-02-24 DIAGNOSIS — I96 Gangrene, not elsewhere classified: Secondary | ICD-10-CM

## 2020-02-24 HISTORY — PX: AMPUTATION: SHX166

## 2020-02-24 LAB — BASIC METABOLIC PANEL
Anion gap: 10 (ref 5–15)
BUN: 27 mg/dL — ABNORMAL HIGH (ref 8–23)
CO2: 25 mmol/L (ref 22–32)
Calcium: 8.9 mg/dL (ref 8.9–10.3)
Chloride: 104 mmol/L (ref 98–111)
Creatinine, Ser: 1.63 mg/dL — ABNORMAL HIGH (ref 0.61–1.24)
GFR, Estimated: 39 mL/min — ABNORMAL LOW (ref >60)
Glucose, Bld: 157 mg/dL — ABNORMAL HIGH (ref 70–99)
Potassium: 4.3 mmol/L (ref 3.5–5.1)
Sodium: 139 mmol/L (ref 135–145)

## 2020-02-24 LAB — CBC
HCT: 32.1 % — ABNORMAL LOW (ref 39.0–52.0)
Hemoglobin: 10.9 g/dL — ABNORMAL LOW (ref 13.0–17.0)
MCH: 29.7 pg (ref 26.0–34.0)
MCHC: 34 g/dL (ref 30.0–36.0)
MCV: 87.5 fL (ref 80.0–100.0)
Platelets: 441 10*3/uL — ABNORMAL HIGH (ref 150–400)
RBC: 3.67 MIL/uL — ABNORMAL LOW (ref 4.22–5.81)
RDW: 15.9 % — ABNORMAL HIGH (ref 11.5–15.5)
WBC: 20 10*3/uL — ABNORMAL HIGH (ref 4.0–10.5)
nRBC: 0 % (ref 0.0–0.2)

## 2020-02-24 SURGERY — AMPUTATION DIGIT
Anesthesia: Monitor Anesthesia Care | Laterality: Left

## 2020-02-24 MED ORDER — OXYCODONE HCL 5 MG PO TABS: 5.0000 mg | ORAL_TABLET | Freq: Once | ORAL | Status: DC | PRN

## 2020-02-24 MED ORDER — OXYCODONE HCL 5 MG/5ML PO SOLN: 5.0000 mg | Freq: Once | ORAL | Status: DC | PRN

## 2020-02-24 MED ORDER — ONDANSETRON HCL 4 MG/2ML IJ SOLN
INTRAMUSCULAR | Status: DC | PRN
  Administered 2020-02-24: 4 mg via INTRAVENOUS

## 2020-02-24 MED ORDER — PROPOFOL 10 MG/ML IV BOLUS
INTRAVENOUS | Status: AC
  Filled 2020-02-24: qty 20

## 2020-02-24 MED ORDER — ONDANSETRON HCL 4 MG/2ML IJ SOLN
INTRAMUSCULAR | Status: AC
  Filled 2020-02-24: qty 2

## 2020-02-24 MED ORDER — PROPOFOL 500 MG/50ML IV EMUL
INTRAVENOUS | Status: DC | PRN
  Administered 2020-02-24: 30 ug/kg/min via INTRAVENOUS

## 2020-02-24 MED ORDER — FENTANYL CITRATE (PF) 250 MCG/5ML IJ SOLN
INTRAMUSCULAR | Status: AC
  Filled 2020-02-24: qty 5

## 2020-02-24 MED ORDER — MIDAZOLAM HCL 2 MG/2ML IJ SOLN
INTRAMUSCULAR | Status: AC
  Filled 2020-02-24: qty 2

## 2020-02-24 MED ORDER — CEFAZOLIN SODIUM-DEXTROSE 2-3 GM-%(50ML) IV SOLR
INTRAVENOUS | Status: DC | PRN
  Administered 2020-02-24: 2 g via INTRAVENOUS

## 2020-02-24 MED ORDER — LACTATED RINGERS IV SOLN: INTRAVENOUS | Status: DC | PRN

## 2020-02-24 MED ORDER — ROPIVACAINE HCL 5 MG/ML IJ SOLN
INTRAMUSCULAR | Status: DC | PRN
  Administered 2020-02-24: 40 mL via EPIDURAL

## 2020-02-24 MED ORDER — FENTANYL CITRATE (PF) 250 MCG/5ML IJ SOLN
INTRAMUSCULAR | Status: DC | PRN
  Administered 2020-02-24 (×2): 50 ug via INTRAVENOUS

## 2020-02-24 MED ORDER — FENTANYL CITRATE (PF) 100 MCG/2ML IJ SOLN: 25.0000 ug | INTRAMUSCULAR | Status: DC | PRN

## 2020-02-24 MED ORDER — 0.9 % SODIUM CHLORIDE (POUR BTL) OPTIME
TOPICAL | Status: DC | PRN
  Administered 2020-02-24: 1000 mL

## 2020-02-24 SURGICAL SUPPLY — 30 items
BLADE AVERAGE 25X9 (BLADE) IMPLANT
BLADE SAW SGTL 81X20 HD (BLADE) IMPLANT
BNDG ELASTIC 4X5.8 VLCR STR LF (GAUZE/BANDAGES/DRESSINGS) ×2 IMPLANT
BNDG GAUZE ELAST 4 BULKY (GAUZE/BANDAGES/DRESSINGS) ×2 IMPLANT
CANISTER SUCT 3000ML PPV (MISCELLANEOUS) ×2 IMPLANT
COVER SURGICAL LIGHT HANDLE (MISCELLANEOUS) ×2 IMPLANT
COVER WAND RF STERILE (DRAPES) IMPLANT
DRAPE EXTREMITY T 121X128X90 (DISPOSABLE) ×2 IMPLANT
DRAPE HALF SHEET 40X57 (DRAPES) IMPLANT
ELECT REM PT RETURN 9FT ADLT (ELECTROSURGICAL) ×2
ELECTRODE REM PT RTRN 9FT ADLT (ELECTROSURGICAL) ×1 IMPLANT
GAUZE SPONGE 4X4 12PLY STRL (GAUZE/BANDAGES/DRESSINGS) IMPLANT
GAUZE SPONGE 4X4 12PLY STRL LF (GAUZE/BANDAGES/DRESSINGS) ×2 IMPLANT
GLOVE BIO SURGEON STRL SZ7.5 (GLOVE) ×2 IMPLANT
GLOVE SURG UNDER POLY LF SZ6.5 (GLOVE) ×4 IMPLANT
GOWN STRL REUS W/ TWL LRG LVL3 (GOWN DISPOSABLE) ×2 IMPLANT
GOWN STRL REUS W/ TWL XL LVL3 (GOWN DISPOSABLE) ×1 IMPLANT
GOWN STRL REUS W/TWL LRG LVL3 (GOWN DISPOSABLE) ×2
GOWN STRL REUS W/TWL XL LVL3 (GOWN DISPOSABLE) ×2
KIT BASIN OR (CUSTOM PROCEDURE TRAY) ×2 IMPLANT
KIT TURNOVER KIT B (KITS) ×2 IMPLANT
NEEDLE HYPO 25GX1X1/2 BEV (NEEDLE) IMPLANT
NS IRRIG 1000ML POUR BTL (IV SOLUTION) ×2 IMPLANT
PACK GENERAL/GYN (CUSTOM PROCEDURE TRAY) ×2 IMPLANT
PAD ARMBOARD 7.5X6 YLW CONV (MISCELLANEOUS) ×4 IMPLANT
SUT ETHILON 3 0 PS 1 (SUTURE) ×2 IMPLANT
SYR CONTROL 10ML LL (SYRINGE) IMPLANT
TOWEL GREEN STERILE (TOWEL DISPOSABLE) ×2 IMPLANT
UNDERPAD 30X36 HEAVY ABSORB (UNDERPADS AND DIAPERS) ×2 IMPLANT
WATER STERILE IRR 1000ML POUR (IV SOLUTION) ×2 IMPLANT

## 2020-02-24 NOTE — Anesthesia Procedure Notes (Signed)
Anesthesia Regional Block: Ankle block   Pre-Anesthetic Checklist: ,, timeout performed, Correct Patient, Correct Site, Correct Laterality, Correct Procedure, Correct Position, site marked, Risks and benefits discussed,  Surgical consent,  Pre-op evaluation,  At surgeon's request and post-op pain management  Laterality: Left and Lower  Prep: chloraprep       Needles:  Injection technique: Single-shot  Needle Type: Quincke     Needle Length: 4cm  Needle Gauge: 25     Additional Needles:   (perineural infiltration)  Narrative:  Start time: 02/24/2020 7:15 AM End time: 02/24/2020 7:21 AM Injection made incrementally with aspirations every 5 mL.  Performed by: Personally  Anesthesiologist: Annye Asa, MD  Additional Notes: Pt identified in Holding room.  Monitors applied. Working IV access confirmed. Sterile prep L ankle.  #25ga Quincke perineural infiltration of local around deep and sup peroneal, saph, post tib, sural nerves.  40cc 0.5% Ropivacaine  injected incrementally.  Patient asymptomatic, VSS, no heme aspirated, tolerated well.  Jenita Seashore, MD

## 2020-02-24 NOTE — Progress Notes (Signed)
Mobility Specialist - Progress Note   02/24/20 1545  Mobility  Activity Contraindicated/medical hold   Instructed by RN to hold mobility on pt until after he has PT eval.   Pricilla Handler Mobility Specialist Mobility Specialist Phone: [phone]

## 2020-02-24 NOTE — Transfer of Care (Signed)
Immediate Anesthesia Transfer of Care Note  Patient: Peter James.  Procedure(s) Performed: LEFT SECOND TOE AMPUTATION (Left )  Patient Location: PACU  Anesthesia Type:MAC combined with regional for post-op pain  Level of Consciousness: drowsy and patient cooperative  Airway & Oxygen Therapy: Patient Spontanous Breathing  Post-op Assessment: Report given to RN and Post -op Vital signs reviewed and stable  Post vital signs: Reviewed and stable  Last Vitals:  Vitals Value Taken Time  BP 124/66 02/24/20 0816  Temp    Pulse 92 02/24/20 0817  Resp 27 02/24/20 0817  SpO2 91 % 02/24/20 0817  Vitals shown include unvalidated device data.  Last Pain:  Vitals:   02/24/20 0531  TempSrc: Oral  PainSc:          Complications: No complications documented.

## 2020-02-24 NOTE — Progress Notes (Signed)
Orthopedic Tech Progress Note Patient Details:  Peter James. [birth year] [id number] Fitted shoe to patient and left in room for when PT comes or patient needs to get up. RN called and stated patient needed darco shoe Ortho Devices Type of Ortho Device: Darco shoe Ortho Device/Splint Location: LLE Ortho Device/Splint Interventions: Ordered, Adjustment   Post Interventions Patient Tolerated: Well Instructions Provided: Care of device, Adjustment of device, Poper ambulation with device   [name] 02/24/2020, 1:10 PM

## 2020-02-24 NOTE — Progress Notes (Signed)
  Progress Note    02/24/2020 7:30 AM Day of Surgery  Subjective:  No overnight issues  Vitals:   02/24/20 0402 02/24/20 0531  BP: (!) 125/58   Pulse: 86   Resp: 20   Temp: 100.3 F (37.9 C) 99.9 F (37.7 C)  SpO2: 97%     Physical Exam: aaox3 Non labored breathing Left toes with bandage in place  CBC    Component Value Date/Time   WBC 20.0 (H) 02/24/2020 0201   RBC 3.67 (L) 02/24/2020 0201   HGB 10.9 (L) 02/24/2020 0201   HGB 13.0 01/29/2020 1153   HCT 32.1 (L) 02/24/2020 0201   HCT 39.8 01/29/2020 1153   PLT 441 (H) 02/24/2020 0201   PLT 415 01/29/2020 1153   MCV 87.5 02/24/2020 0201   MCV 90 01/29/2020 1153   MCH 29.7 02/24/2020 0201   MCHC 34.0 02/24/2020 0201   RDW 15.9 (H) 02/24/2020 0201   RDW 15.2 01/29/2020 1153   LYMPHSABS 1.8 01/29/2020 1153   MONOABS 0.9 10/06/2019 0737   EOSABS 0.0 01/29/2020 1153   BASOSABS 0.0 01/29/2020 1153    BMET    Component Value Date/Time   NA 139 02/24/2020 0201   NA 139 01/29/2020 1153   K 4.3 02/24/2020 0201   CL 104 02/24/2020 0201   CO2 25 02/24/2020 0201   GLUCOSE 157 (H) 02/24/2020 0201   BUN 27 (H) 02/24/2020 0201   BUN 49 (H) 01/29/2020 1153   CREATININE 1.63 (H) 02/24/2020 0201   CALCIUM 8.9 02/24/2020 0201   GFRNONAA 39 (L) 02/24/2020 0201   GFRAA 37 (L) 01/29/2020 1153    INR    Component Value Date/Time   INR 1.0 09/30/2019 1412     Intake/Output Summary (Last 24 hours) at 02/24/2020 0730 Last data filed at 02/23/2020 2306 Gross per 24 hour  Intake 240 ml  Output 725 ml  Net -485 ml     Assessment:  86 y.o. male is s/p left peroneal pta  Plan: OR today for left 2nd possible 3rd toe amputation   Neve Branscomb C. Donzetta Matters, MD [facility] [place] Office: [phone] Pager: [phone]  02/24/2020 7:30 AM

## 2020-02-24 NOTE — Op Note (Signed)
    Patient name: Peter James. MRN: [id number] DOB: [birth year] Sex: male  02/24/2020 Pre-operative Diagnosis: left 2nd toe gangrene Post-operative diagnosis:  Same Surgeon:  Erlene Quan C. Donzetta Matters, MD Procedure Performed:  Left 2nd toe amputation  Indications:   86 year old male with history of bilateral lower extremity wounds. More recently has a second toe bone exposure with dry gangrene. He is indicated for toe amputation after left peroneal revascularization yesterday in the Cath Lab.  Findings: There was adequate bleeding good capillary bleeding in the wound bed. Third toe appeared healthy.   Procedure:  The patient was identified in the holding area and taken to the operating was placed supine on the operative table. Preoperative block of been placed in the left ankle. He was sterilely prepped and draped in the left foot in the usual fashion antibiotics were ministered a timeout was called. A tennis racquet type incision was made around the second toe. I dissected back resected the entire second toe remove the joint capsule of the second metatarsal. There was good bleeding in the wound bed. I thoroughly irrigated hemostasis and closed with interrupted nylon suture at the skin level. He tolerated procedure without any complication. A sterile dressing was placed   EBL: 10cc  Adreyan Carbajal C. Donzetta Matters, MD [facility] [place] Office: [phone] Pager: [phone]

## 2020-02-24 NOTE — Progress Notes (Signed)
Patient back to room from PACU. Vital signs obtained. Alert and oriented to room and call light. Call bell within reach.  Era Bumpers, RN

## 2020-02-24 NOTE — Anesthesia Postprocedure Evaluation (Signed)
Anesthesia Post Note  Patient: Peter James.  Procedure(s) Performed: LEFT SECOND TOE AMPUTATION (Left )     Patient location during evaluation: PACU Anesthesia Type: Regional and MAC Level of consciousness: awake and alert, patient cooperative and oriented Pain management: pain level controlled Vital Signs Assessment: post-procedure vital signs reviewed and stable Respiratory status: spontaneous breathing, nonlabored ventilation and respiratory function stable Cardiovascular status: blood pressure returned to baseline and stable Postop Assessment: no apparent nausea or vomiting Anesthetic complications: no   No complications documented.  Last Vitals:  Vitals:   02/24/20 0846 02/24/20 0908  BP: 132/66 (!) 129/57  Pulse: 85 93  Resp: (!) 24 20  Temp: 36.9 C 36.8 C  SpO2: 99% 98%    Last Pain:  Vitals:   02/24/20 0908  TempSrc: Oral  PainSc:                  Azaria Stegman,E. Makenley Shimp

## 2020-02-25 ENCOUNTER — Encounter (HOSPITAL_COMMUNITY): Payer: Self-pay | Admitting: Vascular Surgery

## 2020-02-25 ENCOUNTER — Other Ambulatory Visit: Payer: Self-pay

## 2020-02-25 DIAGNOSIS — I739 Peripheral vascular disease, unspecified: Secondary | ICD-10-CM

## 2020-02-25 MED ORDER — LOPERAMIDE HCL 2 MG PO CAPS
2.0000 mg | ORAL_CAPSULE | ORAL | Status: DC | PRN
  Administered 2020-02-25: 2 mg via ORAL
  Filled 2020-02-25: qty 1

## 2020-02-25 NOTE — Progress Notes (Signed)
Mobility Specialist: Progress Note   02/25/20 1505  Mobility  Activity Ambulated in hall  Level of Assistance Minimal assist, patient does 75% or more  Assistive Device Front wheel walker  Distance Ambulated (ft) 90 ft  Mobility Response Tolerated well  Mobility performed by Mobility specialist  $Mobility charge 1 Mobility   Pre-Mobility: 89 HR, 128/74 BP Post-Mobility: 101 HR, 139/68 BP  Pt stopped to take one standing rest break halfway through ambulation due to feeling SOB. Rest break lasted 1 minute. Pt back to bed with call bell at his side.   [facility] Kaniesha Barile Mobility Specialist

## 2020-02-25 NOTE — Progress Notes (Addendum)
  Progress Note    02/25/2020 7:47 AM 1 Day Post-Op  Subjective:  No pain; ambulated yesterday with darco shoe   Vitals:   02/25/20 0406 02/25/20 0700  BP: 112/63 122/61  Pulse: 92 81  Resp: 20 20  Temp: 100.1 F (37.8 C) 100 F (37.8 C)  SpO2: 91% 93%   Physical Exam: Lungs:  Non labored Incisions:  R groin soft without palpable hematoma Extremities:  L peroneal brisk by doppler; dressing left in place L foot Neurologic: A&O  CBC    Component Value Date/Time   WBC 20.0 (H) 02/24/2020 0201   RBC 3.67 (L) 02/24/2020 0201   HGB 10.9 (L) 02/24/2020 0201   HGB 13.0 01/29/2020 1153   HCT 32.1 (L) 02/24/2020 0201   HCT 39.8 01/29/2020 1153   PLT 441 (H) 02/24/2020 0201   PLT 415 01/29/2020 1153   MCV 87.5 02/24/2020 0201   MCV 90 01/29/2020 1153   MCH 29.7 02/24/2020 0201   MCHC 34.0 02/24/2020 0201   RDW 15.9 (H) 02/24/2020 0201   RDW 15.2 01/29/2020 1153   LYMPHSABS 1.8 01/29/2020 1153   MONOABS 0.9 10/06/2019 0737   EOSABS 0.0 01/29/2020 1153   BASOSABS 0.0 01/29/2020 1153    BMET    Component Value Date/Time   NA 139 02/24/2020 0201   NA 139 01/29/2020 1153   K 4.3 02/24/2020 0201   CL 104 02/24/2020 0201   CO2 25 02/24/2020 0201   GLUCOSE 157 (H) 02/24/2020 0201   BUN 27 (H) 02/24/2020 0201   BUN 49 (H) 01/29/2020 1153   CREATININE 1.63 (H) 02/24/2020 0201   CALCIUM 8.9 02/24/2020 0201   GFRNONAA 39 (L) 02/24/2020 0201   GFRAA 37 (L) 01/29/2020 1153    INR    Component Value Date/Time   INR 1.0 09/30/2019 1412     Intake/Output Summary (Last 24 hours) at 02/25/2020 0747 Last data filed at 02/25/2020 1224 Gross per 24 hour  Intake 470 ml  Output 175 ml  Net 295 ml     Assessment/Plan:  86 y.o. male is s/p L peroneal atherectomy and angioplasty with subseuqent 2nd toe amp 1 Day Post-Op   Brisk L peroneal signal by doppler Heel weightbearing; continue ambulating with darco Dressing change tomorrow Continue asa, plavix, statin Patient  lives alone; he is more comfortable with discharge home tomorrow   Dagoberto Ligas, PA-C [facility] [phone] 02/25/2020 7:47 AM   I have independently interviewed and examined patient and agree with PA assessment and plan above.   Euriah Matlack C. Donzetta Matters, MD [facility] [place] Office: [phone] Pager: [phone]

## 2020-02-25 NOTE — Evaluation (Signed)
Occupational Therapy Evaluation Patient Details Name: Peter James. MRN: [id number] DOB: [birth year] Today's Date: 02/25/2020    History of Present Illness 86 year old male with history of bilateral lower extremity wounds. More recently has a second toe bone exposure with dry gangrene. He is indicated for toe amputation.  PMH:  hypertension, diabetes, hyperlipidemia and nonobstructive coronary artery disease, left great toe amputation.   Clinical Impression   Patient admitted for the above diagnosis and subsequent procedure.  He presents with the deficits listed below.  PTA he lived alone with daily check ins form his daughter.  He is able to care for his meds and ADL, but the daughter and son in law assist with meals, community mobility and home management.  Currently he is Min A with lower body ADL, Mod I with toileting and supervision with mobility at RW level.  Patient hopes to return home, and is open to [facility] services.  OT will continue to follow in the acute setting.      Follow Up Recommendations  Home health OT;Supervision - Intermittent    Equipment Recommendations  None recommended by OT    Recommendations for Other Services       Precautions / Restrictions Precautions Precautions: Fall Precaution Comments: Weight bearing through heel. Required Braces or Orthoses: Other Brace Other Brace: Darco Shoe Restrictions Other Position/Activity Restrictions: Surgical shoe with ambulation      Mobility Bed Mobility Overal bed mobility: Needs Assistance Bed Mobility: Rolling;Supine to Sit;Sit to Supine Rolling: Modified independent (Device/Increase time)   Supine to sit: Supervision Sit to supine: Supervision        Transfers Overall transfer level: Needs assistance   Transfers: Sit to/from Stand;Stand Pivot Transfers Sit to Stand: Supervision;From elevated surface Stand pivot transfers: Modified independent (Device/Increase time)       General transfer comment:  patient needs to gain momentum stand from a sit    Balance Overall balance assessment: Mild deficits observed, not formally tested                                         ADL either performed or assessed with clinical judgement   ADL Overall ADL's : Needs assistance/impaired Eating/Feeding: Independent   Grooming: Wash/dry hands;Wash/dry face;Oral care;Supervision/safety;Standing   Upper Body Bathing: Set up;Sitting   Lower Body Bathing: Supervison/ safety;Sit to/from stand   Upper Body Dressing : Set up;Sitting   Lower Body Dressing: Minimal assistance;Sit to/from stand   Toilet Transfer: Modified Independent;RW;Ambulation   Toileting- Water quality scientist and Hygiene: Independent;Sit to/from stand       Functional mobility during ADLs: Rolling walker;Supervision/safety       Vision Baseline Vision/History: Wears glasses Patient Visual Report: No change from baseline                  Pertinent Vitals/Pain Pain Assessment: No/denies pain     Hand Dominance Right   Extremity/Trunk Assessment Upper Extremity Assessment Upper Extremity Assessment: Overall WFL for tasks assessed   Lower Extremity Assessment Lower Extremity Assessment: Defer to PT evaluation   Cervical / Trunk Assessment Cervical / Trunk Assessment: Normal   Communication Communication Communication: HOH   Cognition Arousal/Alertness: Awake/alert Behavior During Therapy: WFL for tasks assessed/performed Overall Cognitive Status: Within Functional Limits for tasks assessed  General Comments   VSS               Home Living Family/patient expects to be discharged to:: Private residence Living Arrangements: Alone Available Help at Discharge: Family;Available PRN/intermittently Type of Home: House Home Access: Stairs to enter [employer] of Steps: 2 side door. Entrance Stairs-Rails: Can reach  both;Left;Right Home Layout: One level     Bathroom Shower/Tub: Occupational psychologist: Handicapped height Bathroom Accessibility: Yes How Accessible: Accessible via walker Home Equipment: [place] - 2 wheels;Cane - single point;Hand held shower head;Grab bars - tub/shower;Shower seat          Prior Functioning/Environment Level of Independence: Independent with assistive device(s)        Comments: Drives locally.  Cares for his own meds.  Able to bathe and dress himself.  Daughter stops by daily: assists with groceries, meals, home management.  Son in law takes him to his MD appointments.  Had progressed to a [facility] with mobility in and out of the home.        OT Problem List: Decreased activity tolerance;Impaired balance (sitting and/or standing)      OT Treatment/Interventions: Self-care/ADL training;Therapeutic exercise;DME and/or AE instruction;Therapeutic activities    OT Goals(Current goals can be found in the care plan section) Acute Rehab OT Goals Patient Stated Goal: I'd like to go home Friday.  My family will be there. OT Goal Formulation: With patient Time For Goal Achievement: 03/10/20 Potential to Achieve Goals: Good ADL Goals Pt Will Perform Grooming: with modified independence;standing Pt Will Perform Lower Body Bathing: with modified independence;sit to/from stand Pt Will Perform Lower Body Dressing: with modified independence;sit to/from stand Pt Will Transfer to Toilet: with modified independence;ambulating;regular height toilet  OT Frequency: Min 2X/week   Barriers to D/C:    none noted       Co-evaluation              AM-PAC OT "6 Clicks" Daily Activity     Outcome Measure Help from another person eating meals?: None Help from another person taking care of personal grooming?: A Little Help from another person toileting, which includes using toliet, bedpan, or urinal?: A Little Help from another person bathing (including washing,  rinsing, drying)?: A Little Help from another person to put on and taking off regular upper body clothing?: None Help from another person to put on and taking off regular lower body clothing?: A Little 6 Click Score: 20   End of Session Equipment Utilized During Treatment: Gait belt;Rolling walker Nurse Communication: Other (comment) (patient with loose stool)  Activity Tolerance: Patient tolerated treatment well Patient left: in bed;with call bell/phone within reach;with nursing/sitter in room  OT Visit Diagnosis: Unsteadiness on feet (R26.81)                Time: 0037-0488 OT Time Calculation (min): 22 min Charges:  OT General Charges $OT Visit: 1 Visit OT Evaluation $OT Eval Moderate Complexity: 1 Mod  02/25/2020  Rich, OTR/L  Acute Rehabilitation Services  Office:  [phone]   Metta Clines 02/25/2020, 4:44 PM

## 2020-02-26 MED ORDER — TRAMADOL HCL 50 MG PO TABS
50.0000 mg | ORAL_TABLET | Freq: Four times a day (QID) | ORAL | 0 refills | Status: DC | PRN
Start: 2020-02-26 — End: 2020-06-08

## 2020-02-26 NOTE — Discharge Summary (Signed)
[facility] Discharge Summary   Patient ID:  Peter James. MRN: [id number] DOB/AGE: September 09, 1925 86 y.o.  Admit date: 02/23/2020 Discharge date: 02/27/2020 Date of Surgery: 02/24/2020 Surgeon: Surgeon(s): Waynetta Sandy, MD  Admission Diagnosis: PAD (peripheral artery disease) [facility]) [I73.9]  Discharge Diagnoses:  PAD (peripheral artery disease) ([place]) [I73.9]  Secondary Diagnoses: Past Medical History:  Diagnosis Date  . Acute lower GI bleeding    Recurrent: 2011, 2012  . Bladder cancer ([place])    Transitional cell, 2001  . Bradycardia 01/20/2020  . CAD (coronary atherosclerotic disease)    Nonobstructive, 05/2012  . Exertional dyspnea    EF 78-46%, grade 1 diastolic dysfunction, 11/6293  . HLD (hyperlipidemia)   . HTN (hypertension)   . Neuropathy   . Other insomnia   . PAD (peripheral artery disease) ([place])    ABIs, 2009: 0.89 right; 0.99 left  . Type 2 diabetes mellitus ([place])   . Valvular heart disease   . Vertigo     Procedure(s): LEFT SECOND TOE AMPUTATION 1.  Ultrasound-guided cannulation right common femoral artery 2.  Selection of left popliteal artery and left lower extremity angiogram 3.  Laser arthrectomy of left peroneal artery with 0.9 mm A[place]on 4.  Plain balloon angioplasty left peroneal artery with 2.5 mm balloon 5.  Selection of left anterior tibial artery 6.  Mynx device closure right common femoral artery  Discharged Condition: good  HPI: Peter Jamesis a 86 y.o.maleK[place] War veteran with history of hypertension, diabetes, hyperlipidemia and nonobstructive coronary artery disease. He does have a history of a left great toe amputation. Now has wounds on the right medial foot at the first metatarsal head and also has a wound on the second toe on the left. He denies any history of vascular intervention. He is a former smoker quit many years ago. He does walk currently wearing a postop shoe on the right due to  the wound. He is followed by the [facility] for his wounds. He walks with help of a cane. He does not have any rest pain or claudication symptoms.   86 year old male with bilateral foot wounds right appears somewhat worse than the left as the left is only on the tip of the second toe. Right ABI is lower. Appears to have both SFA and probable tibial disease from pulse exam and decreased ABIs. He has been scheduled for angiogram with CO2.   Hospital Course:  Peter James. is a 86 y.o. male is S/P  Procedure(s): 1.  Ultrasound-guided cannulation right common femoral artery 2.  Selection of left popliteal artery and left lower extremity angiogram 3.  Laser arthrectomy of left peroneal artery with 0.9 mm Auryon 4.  Plain balloon angioplasty left peroneal artery with 2.5 mm balloon 5.  Selection of left anterior tibial artery 6.  Mynx device closure right common femoral artery  LEFT SECOND TOE AMPUTATION Extubated: POD # 0 Post-op wounds clean, dry, intact or healing well Pt. Ambulating, voiding and taking PO diet without difficulty. Pt pain controlled with PO pain meds. Labs as below Complications:none  Patient tolerated the procedures well. Lower extremities well perfused and warm. Left leg with brisk peroneal signal. Left 2nd toe amputation site sutures intact and healing well. No pain in left leg or foot. Ambulated well with Darco shoe. PT/ OT arranged for home health services as well as home health aid to assist patient with wound care as he lives alone. He otherwise was instructed to resume his home  medications. He will remain on Aspirin, Statin and plavix. PDMP was reviewed and patient was sent pain medication for short term post operative pain medication to his pharmacy. He has follow up   arranged on 03/05/20  Consults: None   Significant Diagnostic Studies: CBC Lab Results  Component Value Date   WBC 20.0 (H) 02/24/2020   HGB 10.9 (L) 02/24/2020   HCT 32.1 (L) 02/24/2020    MCV 87.5 02/24/2020   PLT 441 (H) 02/24/2020    BMET    Component Value Date/Time   NA 139 02/24/2020 0201   NA 139 01/29/2020 1153   K 4.3 02/24/2020 0201   CL 104 02/24/2020 0201   CO2 25 02/24/2020 0201   GLUCOSE 157 (H) 02/24/2020 0201   BUN 27 (H) 02/24/2020 0201   BUN 49 (H) 01/29/2020 1153   CREATININE 1.63 (H) 02/24/2020 0201   CALCIUM 8.9 02/24/2020 0201   GFRNONAA 39 (L) 02/24/2020 0201   GFRAA 37 (L) 01/29/2020 1153   COAG Lab Results  Component Value Date   INR 1.0 09/30/2019     Disposition:  Discharge to :Home Discharge Instructions    Discharge patient   Complete by: As directed    Discharge disposition: 01-Home or Self Care   Discharge patient date: 02/26/2020   Discharge wound care:   Complete by: As directed    Clean 2nd toe amputation site with mild soap and water. Dry thoroughly. Can leave open to air or cover with light dressing. Do not apply any lotions, neosporin or topical medications to incision line     Allergies as of 02/26/2020   No Known Allergies     Medication List    TAKE these medications   albuterol 108 (90 Base) MCG/ACT inhaler Commonly known as: VENTOLIN HFA Inhale 2 puffs into the lungs every 6 (six) hours as needed for wheezing or shortness of breath.   allopurinol 300 MG tablet Commonly known as: ZYLOPRIM Take 300 mg by mouth daily.   Alogliptin Benzoate 12.5 MG Tabs Take 6.25 mg by mouth daily.   amLODipine 10 MG tablet Commonly known as: NORVASC Take 1 tablet (10 mg total) by mouth daily.   aspirin EC 81 MG tablet Take 81 mg by mouth daily. Swallow whole.   atorvastatin 10 MG tablet Commonly known as: LIPITOR Take 1 tablet (10 mg total) by mouth every evening.   carvedilol 3.125 MG tablet Commonly known as: COREG Take 3.125 mg by mouth 2 (two) times daily with a meal.   cholecalciferol 25 MCG (1000 UNIT) tablet Commonly known as: VITAMIN D3 Take 1,000 Units by mouth daily.   clopidogrel 75 MG  tablet Commonly known as: Plavix Take 1 tablet (75 mg total) by mouth daily.   clotrimazole 1 % cream Commonly known as: LOTRIMIN Apply 1 application topically once a week.   nortriptyline 10 MG capsule Commonly known as: PAMELOR Take 10 mg by mouth at bedtime.   polyethylene glycol 17 g packet Commonly known as: MIRALAX / GLYCOLAX Take 17 g by mouth daily as needed for mild constipation.   sildenafil 100 MG tablet Commonly known as: VIAGRA Take 100 mg by mouth daily as needed for erectile dysfunction.   tamsulosin 0.4 MG Caps capsule Commonly known as: FLOMAX Take 0.4 mg by mouth daily.   traMADol 50 MG tablet Commonly known as: Ultram Take 1 tablet (50 mg total) by mouth every 6 (six) hours as needed.  Discharge Care Instructions  (From admission, onward)         Start     Ordered   02/26/20 0000  Discharge wound care:       Comments: Clean 2nd toe amputation site with mild soap and water. Dry thoroughly. Can leave open to air or cover with light dressing. Do not apply any lotions, neosporin or topical medications to incision line   02/26/20 1312         Verbal and written Discharge instructions given to the patient. Wound care per Discharge AVS  Follow-up Information    Waynetta Sandy, MD Follow up.   Specialties: Vascular Surgery, Cardiology Why: Has follow up on 03/05/20 Contact information: [place] [address] [phone]               Signed: Karoline Caldwell 02/27/2020, 11:58 AM

## 2020-02-26 NOTE — TOC Transition Note (Signed)
Transition of Care (TOC) - CM/SW Discharge Note Marvetta Gibbons RN, BSN Transitions of Care Unit 4E- RN Case Manager See Treatment Team for direct phone #    Patient Details  Name: Seraphim Trow. MRN: [id number] Date of Birth: 09/13/1925  Transition of Care [facility]) CM/SW Contact:  Dawayne Patricia, RN Phone Number: 02/26/2020, 4:05 PM   Clinical Narrative:    Pt stable for transition home today, spoke with pt and daughter at the bedside for [facility] needs- per daughter pt lives independently with family checking in on him, has needed DME at home, list provided for [facility] choice Per CMS guidelines from medicare.gov website with star ratings (copy placed in shadow chart) per daughter they would like either [facility] or [facility] if not able at accept then no preference after that and defer to this writer to secure. Address, phone # and PCP all confirmed in epic- daughter Colletta [place] request to be first contact- her # also confirmed in epic.   Pt also goes to the [facility] and as a PCP there also.   Calls made to [facility]- unable to accept due to staffing [facility]- do not service [place] at this time [facility]- can accept- however have a delay in start of care until next week and are unable to provide aide.  [facility]- able to accept referral for PT/OT/aide- they will contact daughter for start of care within 48 hr of discharge.  Daughter called and updated on [facility] service provider.    Final next level of care: [place] Barriers to Discharge: No Barriers Identified   Patient Goals and CMS Choice Patient states their goals for this hospitalization and ongoing recovery are:: get home CMS Medicare.gov Compare Post Acute Care list provided to:: Patient Choice offered to / list presented to : [place]  Discharge Placement               Home with [facility]        Discharge Plan and Services   Discharge Planning Services: CM Consult Post Acute Care Choice: Home Health           DME Arranged: N/A DME Agency: NA       HH Arranged: PT,OT,Nurse's Aide [place] Agency: [place] Date [facility] Agency Contacted: 02/26/20 Time Terral: 1604 Representative spoke with at [place]: [place] ([place]) Interventions     Readmission Risk Interventions No flowsheet data found.

## 2020-02-26 NOTE — Progress Notes (Signed)
Order received to discharge patient.  Telemetry monitor removed and CCMD notified.  PIV access removed without difficulty.  Discharge instructions, follow up, medications and instructions for their use discussed with patient and family member.

## 2020-02-26 NOTE — Discharge Instructions (Signed)
  [facility] [place]  Discharge Instructions  Lower Extremity Angiogram; Angioplasty/Stenting  Please refer to the following instructions for your post-procedure care. Your surgeon or physician assistant will discuss any changes with you.  Activity  Avoid lifting more than 8 pounds (1 gallons of milk) for 5 days after your procedure. You may walk as much as you can tolerate. It's OK to drive after 72 hours.  Bathing/Showering  You may shower the day after your procedure. If you have a bandage, you may remove it at 24- 48 hours. Clean your incision site with mild soap and water. Pat the area dry with a clean towel.  Diet  Resume your pre-procedure diet. There are no special food restrictions following this procedure. All patients with peripheral vascular disease should follow a low fat/low cholesterol diet. In order to heal from your surgery, it is CRITICAL to get adequate nutrition. Your body requires vitamins, minerals, and protein. Vegetables are the best source of vitamins and minerals. Vegetables also provide the perfect balance of protein. Processed food has little nutritional value, so try to avoid this.  Medications  Resume taking all of your medications unless your doctor tells you not to. If your incision is causing pain, you may take over-the-counter pain relievers such as acetaminophen (Tylenol)  Follow Up  Follow up will be arranged at the time of your procedure. You may have an office visit scheduled or may be scheduled for surgery. Ask your surgeon if you have any questions.  Please call us immediately for any of the following conditions: .Severe or worsening pain your legs or feet at rest or with walking. .Increased pain, redness, drainage at your groin puncture site. .Fever of 101 degrees or higher. .If you have any mild or slow bleeding from your puncture site: lie down, apply firm constant pressure over the area with a piece of gauze or a  clean wash cloth for 30 minutes- no peeking!, call 911 right away if you are still bleeding after 30 minutes, or if the bleeding is heavy and unmanageable.  Reduce your risk factors of vascular disease:  . Stop smoking. If you would like help call [facility] at 1-800-QUIT-NOW (1-800-784-8669) or [place] at 336-586-4000. . Manage your cholesterol . Maintain a desired weight . Control your diabetes . Keep your blood pressure down .  If you have any questions, please call the office at [phone] 

## 2020-02-26 NOTE — Evaluation (Signed)
Physical Therapy Evaluation Patient Details Name: Peter James. MRN: [id number] DOB: 01-02-1926 Today's Date: 02/26/2020   History of Present Illness  86 year old male with history of bilateral lower extremity wounds. More recently has a second toe bone exposure with dry gangrene. He is indicated for toe amputation.  PMH:  hypertension, diabetes, hyperlipidemia and nonobstructive coronary artery disease, left great toe amputation.  Clinical Impression  PTA pt living alone in single story home with 2 steps to enter. Pt reports independence, sometimes walking with cane. Son and daughter in-law live nearby and assist with iADLs as needed. Pt is limited in safe mobility today by incontinence of stool, in presence of decreased balance due to [place], and decreased strength and endurance due to fatigue from diarrhea. Pt is supervision for bed mobility and transfers and min guard for ambulation in room. Pt reports his family will stay with him over the weekend and as needed after that. PT recommending HHPT at discharge. PT will continue to follow acutely.     Follow Up Recommendations Home health PT;Supervision/Assistance - 24 hour    Equipment Recommendations  None recommended by PT (has needed equipment at home)       Precautions / Restrictions Precautions Precautions: Fall Precaution Comments: Weight bearing through heel. Required Braces or Orthoses: Other Brace Other Brace: Darco Shoe Restrictions Weight Bearing Restrictions: No Other Position/Activity Restrictions: Surgical shoe with ambulation      Mobility  Bed Mobility Overal bed mobility: Needs Assistance Bed Mobility: Supine to Sit     Supine to sit: Supervision     General bed mobility comments: supervision for safety, good core strength able to pivot both LE together to floor and bring trunk up simultaneously    Transfers Overall transfer level: Needs assistance   Transfers: Sit to/from Stand Sit to Stand:  Supervision;From elevated surface         General transfer comment: supervision for safety, good hand placement for power up from low bed surface, as well as from [facility] over toilet  Ambulation/Gait Ambulation/Gait assistance: Min guard Gait Distance (Feet): 18 Feet Assistive device: Rolling walker (2 wheeled) Gait Pattern/deviations: Step-to pattern;Trunk flexed;Antalgic;Decreased step length - right;Decreased stance time - left;Decreased weight shift to left Gait velocity: slowed Gait velocity interpretation: <1.8 ft/sec, indicate of risk for recurrent falls General Gait Details: min guard for safety, vc for proximity to RW, especially with getting on toilet, washing hands and sitting in recliner, pt tends to move awary from walker, cuing for safety even when pt has urgency of stool or urine         Balance Overall balance assessment: Mild deficits observed, not formally tested                                           Pertinent Vitals/Pain Pain Assessment: Faces Faces Pain Scale: Hurts little more Pain Location: L foot with ambulation Pain Descriptors / Indicators: Tender;Sore;Aching Pain Intervention(s): Limited activity within patient's tolerance;Monitored during session;Repositioned    Home Living Family/patient expects to be discharged to:: Private residence Living Arrangements: Alone Available Help at Discharge: Family;Available PRN/intermittently Type of Home: House Home Access: Stairs to enter Entrance Stairs-Rails: Can reach both;Left;Right Entrance Stairs-Number of Steps: 2 side door. Home Layout: One level Home Equipment: Walker - 2 wheels;Cane - single point;Hand held shower head;Grab bars - tub/shower;Shower seat      Prior Function Level of  Independence: Independent with assistive device(s)         Comments: Drives locally.  Cares for his own meds.  Able to bathe and dress himself.  Daughter stops by daily: assists with groceries,  meals, home management.  Son in law takes him to his MD appointments.  Had progressed to a [facility] with mobility in and out of the home.     Hand Dominance   Dominant Hand: Right    Extremity/Trunk Assessment   Upper Extremity Assessment Upper Extremity Assessment: Defer to OT evaluation    Lower Extremity Assessment Lower Extremity Assessment: LLE deficits/detail;RLE deficits/detail RLE Deficits / Details: ROM and strength WFL, ulcer on bunion covered with clean dry dressing RLE Sensation: WNL LLE Deficits / Details: ROM and Strength WFL, L great toe previously amputated, 2nd toe incision stitches intact, no drainage noted LLE Sensation: WNL    Cervical / Trunk Assessment Cervical / Trunk Assessment: Normal  Communication   Communication: HOH  Cognition Arousal/Alertness: Awake/alert Behavior During Therapy: WFL for tasks assessed/performed Overall Cognitive Status: Within Functional Limits for tasks assessed                                        General Comments General comments (skin integrity, edema, etc.): VSS on RA, pt incontinent of stool with ambulation to bathroom        Assessment/Plan    PT Assessment Patient needs continued PT services  PT Problem List Decreased activity tolerance;Decreased balance;Decreased mobility;Decreased safety awareness;Pain;Decreased skin integrity       PT Treatment Interventions DME instruction;Gait training;Stair training;Functional mobility training;Therapeutic activities;Therapeutic exercise;Balance training;Cognitive remediation;Patient/family education    PT Goals (Current goals can be found in the Care Plan section)  Acute Rehab PT Goals Patient Stated Goal: I'd like to go home Friday.  My family will be there. PT Goal Formulation: With patient Time For Goal Achievement: 03/11/20 Potential to Achieve Goals: Good    Frequency Min 3X/week    AM-PAC PT "6 Clicks" Mobility  Outcome Measure Help needed  turning from your back to your side while in a flat bed without using bedrails?: None Help needed moving from lying on your back to sitting on the side of a flat bed without using bedrails?: None Help needed moving to and from a bed to a chair (including a wheelchair)?: A Little Help needed standing up from a chair using your arms (e.g., wheelchair or bedside chair)?: A Little Help needed to walk in hospital room?: A Little Help needed climbing 3-5 steps with a railing? : A Lot 6 Click Score: 19    End of Session   Activity Tolerance: Treatment limited secondary to medical complications (Comment) (incontinence of stool) Patient left: in chair;with call bell/phone within reach Nurse Communication: Mobility status PT Visit Diagnosis: Unsteadiness on feet (R26.81);Other abnormalities of gait and mobility (R26.89);Difficulty in walking, not elsewhere classified (R26.2);Pain Pain - Right/Left: Left Pain - part of body: Ankle and joints of foot    Time: 0910-0940 PT Time Calculation (min) (ACUTE ONLY): 30 min   Charges:   PT Evaluation $PT Eval Moderate Complexity: 1 Mod PT Treatments $Gait Training: 8-22 mins        Brinleigh Tew B. Migdalia Dk PT, DPT Acute Rehabilitation Services Pager [phone] Office [phone]   [place] 02/26/2020, 9:59 AM

## 2020-02-26 NOTE — Progress Notes (Addendum)
  Progress Note    02/26/2020 7:57 AM 2 Days Post-Op  Subjective:  States he is doing great. No pain in left foot. Ready to go home but just concerned about wound care as he lives alone   Vitals:   02/25/20 2316 02/26/20 0329  BP: 111/60 133/70  Pulse: 85 90  Resp: 18 20  Temp: 99.6 F (37.6 C) 99 F (37.2 C)  SpO2: 92% 100%   Physical Exam: Cardiac:  regular Lungs:  Non labored Incisions:  Left 2nd toe amputation looking good. Sutures intact. No drainage. No tenderness   Extremities:  Bilateral lower extremities well perfused and warm. Doppler Pero and Dp signal left foot Neurologic: alert and oriented  CBC    Component Value Date/Time   WBC 20.0 (H) 02/24/2020 0201   RBC 3.67 (L) 02/24/2020 0201   HGB 10.9 (L) 02/24/2020 0201   HGB 13.0 01/29/2020 1153   HCT 32.1 (L) 02/24/2020 0201   HCT 39.8 01/29/2020 1153   PLT 441 (H) 02/24/2020 0201   PLT 415 01/29/2020 1153   MCV 87.5 02/24/2020 0201   MCV 90 01/29/2020 1153   MCH 29.7 02/24/2020 0201   MCHC 34.0 02/24/2020 0201   RDW 15.9 (H) 02/24/2020 0201   RDW 15.2 01/29/2020 1153   LYMPHSABS 1.8 01/29/2020 1153   MONOABS 0.9 10/06/2019 0737   EOSABS 0.0 01/29/2020 1153   BASOSABS 0.0 01/29/2020 1153    BMET    Component Value Date/Time   NA 139 02/24/2020 0201   NA 139 01/29/2020 1153   K 4.3 02/24/2020 0201   CL 104 02/24/2020 0201   CO2 25 02/24/2020 0201   GLUCOSE 157 (H) 02/24/2020 0201   BUN 27 (H) 02/24/2020 0201   BUN 49 (H) 01/29/2020 1153   CREATININE 1.63 (H) 02/24/2020 0201   CALCIUM 8.9 02/24/2020 0201   GFRNONAA 39 (L) 02/24/2020 0201   GFRAA 37 (L) 01/29/2020 1153    INR    Component Value Date/Time   INR 1.0 09/30/2019 1412    No intake or output data in the 24 hours ending 02/26/20 0757   Assessment/Plan:  86 y.o. male is s/p L peroneal atherectomy and angioplasty with 2nd toe amp 2 Days Post-Op. Brisk L peroneal and DP doppler signal. Left foot is warm. Left 2nd toe  amputation looking good. No pain in left foot. Ambulating with Darco Shoe. OT recommending [place] OT. Waiting on PT recommendations. Patient also requesting [facility] services for wound assistance as he lives at home alone. I have ordered this. He will go home Asa, Plavix, statin. He is otherwise stable for discharge home today. He has follow up arranged with Dr. Donzetta Matters on 03/05/20    Karoline Caldwell, PA-C [facility] [phone] 02/26/2020 7:57 AM   I have independently interviewed and examined patient and agree with PA assessment and plan above.   Davier Tramell C. Donzetta Matters, MD [facility] [place] Office: [phone] Pager: [phone]

## 2020-02-26 NOTE — Progress Notes (Signed)
Asked to check patients wound as he had missed device clinic wound check 2/2 admission .  Steri-strips removed. Wound well healed with no drainage or bleeding.   Device interrogation by industry shows stable device function. Auto capture turned off as was falsely reading high threshold. AMS noted, appears brief episodes of intermittent PACs.  Legrand Como [address]" [place], PA-C  02/26/2020 2:02 PM

## 2020-02-26 NOTE — Progress Notes (Signed)
Mobility Specialist: Progress Note   02/26/20 1239  Mobility  Activity Ambulated in hall  Level of Assistance Contact guard assist, steadying assist  Assistive Device Front wheel walker  Distance Ambulated (ft) 160 ft  Mobility Response Tolerated well  Mobility performed by Mobility specialist  $Mobility charge 1 Mobility   Pre-Mobility: 89 HR, 128/54 BP Post-Mobility: 90 HR, 160/70 BP  Pt stopped to take one standing rest break lasting one minute halfway through ambulation due to feeling SOB. Pt expressed difficulty walking with Darco shoe during ambulation. Pt would occasionally pause when leading with his L foot to stabilize himself before stepping forward. Pt back to bed with call bell by his side.   [facility] Keiarra Charon Mobility Specialist

## 2020-03-05 ENCOUNTER — Other Ambulatory Visit: Payer: Self-pay

## 2020-03-05 ENCOUNTER — Ambulatory Visit (INDEPENDENT_AMBULATORY_CARE_PROVIDER_SITE_OTHER)
Admission: RE | Admit: 2020-03-05 | Discharge: 2020-03-05 | Disposition: A | Discharge status: Home / Self Care | Payer: No Typology Code available for payment source | Source: Ambulatory Visit | Attending: Physician Assistant | Admitting: Physician Assistant

## 2020-03-05 ENCOUNTER — Ambulatory Visit (HOSPITAL_COMMUNITY)
Admission: RE | Admit: 2020-03-05 | Discharge: 2020-03-05 | Disposition: A | Discharge status: Home / Self Care | Payer: No Typology Code available for payment source | Source: Ambulatory Visit | Attending: Physician Assistant | Admitting: Physician Assistant

## 2020-03-05 ENCOUNTER — Ambulatory Visit (INDEPENDENT_AMBULATORY_CARE_PROVIDER_SITE_OTHER): Payer: Medicare Other | Admitting: Physician Assistant

## 2020-03-05 ENCOUNTER — Telehealth: Payer: Self-pay

## 2020-03-05 VITALS — BP 140/77 | HR 100 | Temp 97.6°F | Resp 20 | Ht 73.0 in | Wt 192.9 lb

## 2020-03-05 DIAGNOSIS — I739 Peripheral vascular disease, unspecified: Secondary | ICD-10-CM

## 2020-03-05 DIAGNOSIS — S91301D Unspecified open wound, right foot, subsequent encounter: Secondary | ICD-10-CM

## 2020-03-05 NOTE — Telephone Encounter (Signed)
Gave verbal orders to [name] from OT - Patient will have HH/OT 1 time a week for 4 weeks

## 2020-03-05 NOTE — Progress Notes (Signed)
Office Note     CC:  follow up Requesting Provider:  [name], MD  HPI: Peter James. is a 86 y.o. (03-08-1926) male who presents for one month follow-up s/p right and left aortography. He presented with bilateral foot wounds. He underwent left second toe amputation on 02/24/2020.  He ambulates with rolling walker.  He denies claudication or rest pain.  His son accompanies him today.  His pre-procedure ABIs were 0.57 on the right and 0.71 on the left. His right TP was 73.  01/19/2020: right popliteal artery [place] stent; right popliteal and ATA laser atherectomy; plain balloon angioplasty of right ATA  02/23/2020: left peroneal artery laser atherectomy and plain balloon angioplasty   He is s/p left great toe amputation by Dr. Sharol Given on 10/01/2019. He is compliant with aspirin, Plavix and statin Former smoker DM-2 (no meds); Hypertension in good control   Past Medical History:  Diagnosis Date   Acute lower GI bleeding    Recurrent: 2011, 2012   Bladder cancer ([place])    Transitional cell, 2001   Bradycardia 01/20/2020   CAD (coronary atherosclerotic disease)    Nonobstructive, 05/2012   Exertional dyspnea    EF 25-36%, grade 1 diastolic dysfunction, 08/4401   HLD (hyperlipidemia)    HTN (hypertension)    Neuropathy    Other insomnia    PAD (peripheral artery disease) ([place])    ABIs, 2009: 0.89 right; 0.99 left   Type 2 diabetes mellitus ([place])    Valvular heart disease    Vertigo     Past Surgical History:  Procedure Laterality Date   ABDOMINAL AORTOGRAM W/LOWER EXTREMITY N/A 01/19/2020   Procedure: ABDOMINAL AORTOGRAM W/ Bilateral LOWER EXTREMITY [place];  Surgeon: Waynetta Sandy, MD;  Location: [place] [facility];  Service: Cardiovascular;  Laterality: N/A;   AMPUTATION Left 10/01/2019   Procedure: LEFT GREAT TOE AMPUTATION AT METATARSOPHALANGEAL JOINT;  Surgeon: [name], MD;  Location: [facility];  Service: Orthopedics;  Laterality: Left;    AMPUTATION Left 02/24/2020   Procedure: LEFT SECOND TOE AMPUTATION;  Surgeon: Waynetta Sandy, MD;  Location: [place];  Service: Vascular;  Laterality: Left;   BIV PACEMAKER INSERTION CRT-P N/A 02/11/2020   Procedure: BIV PACEMAKER INSERTION CRT-P;  Surgeon: Thompson Grayer, MD;  Location: [place] [facility];  Service: Cardiovascular;  Laterality: N/A;   CATARACT EXTRACTION     bi lateral   COLONOSCOPY     HEMORRHOID SURGERY     INGUINAL HERNIA REPAIR     LOWER EXTREMITY ANGIOGRAPHY  02/23/2020   Procedure: Lower Extremity Angiography;  Surgeon: Waynetta Sandy, MD;  Location: [place] [facility];  Service: Cardiovascular;;   PERIPHERAL VASCULAR ATHERECTOMY Left 02/23/2020   Procedure: PERIPHERAL VASCULAR ATHERECTOMY;  Surgeon: Waynetta Sandy, MD;  Location: [place] [facility];  Service: Cardiovascular;  Laterality: Left;   PERIPHERAL VASCULAR BALLOON ANGIOPLASTY  02/23/2020   Procedure: PERIPHERAL VASCULAR BALLOON ANGIOPLASTY;  Surgeon: Waynetta Sandy, MD;  Location: [place] [facility];  Service: Cardiovascular;;  left Peroneal    PERIPHERAL VASCULAR INTERVENTION Right 01/19/2020   Procedure: PERIPHERAL VASCULAR INTERVENTION;  Surgeon: Waynetta Sandy, MD;  Location: [place] [facility];  Service: Cardiovascular;  Laterality: Right;    Social History   Socioeconomic History   Marital status: Married    Spouse name: Not on file   Number of children: Not on file   Years of education: Not on file   Highest education level: Not on  file  Occupational History   Not on file  Tobacco Use   Smoking status: Former Smoker    Quit date: 04/19/1972    Years since quitting: 47.9   Smokeless tobacco: Never Used  Vaping Use   Vaping Use: Never used  Substance and Sexual Activity   Alcohol use: Never   Drug use: Never   Sexual activity: Not on file  Other Topics Concern   Not on file  Social History Narrative   Not on file    Social Determinants of Health   Financial Resource Strain: Not on file  Food Insecurity: Not on file  Transportation Needs: Not on file  Physical Activity: Not on file  Stress: Not on file  Social Connections: Not on file  Intimate Partner Violence: Not on file   Family History  Problem Relation Age of Onset   Heart attack Mother    Heart attack Father    Heart attack Sister    Heart attack Sister    Heart attack Sister    Heart attack Sister     Current Outpatient Medications  Medication Sig Dispense Refill   albuterol (VENTOLIN HFA) 108 (90 Base) MCG/ACT inhaler Inhale 2 puffs into the lungs every 6 (six) hours as needed for wheezing or shortness of breath.     allopurinol (ZYLOPRIM) 300 MG tablet Take 300 mg by mouth daily.      Alogliptin Benzoate 12.5 MG TABS Take 6.25 mg by mouth daily.     amLODipine (NORVASC) 10 MG tablet Take 1 tablet (10 mg total) by mouth daily. 90 tablet 3   aspirin EC 81 MG tablet Take 81 mg by mouth daily. Swallow whole.     atorvastatin (LIPITOR) 10 MG tablet Take 1 tablet (10 mg total) by mouth every evening. 30 tablet 6   carvedilol (COREG) 3.125 MG tablet Take 3.125 mg by mouth 2 (two) times daily with a meal.     cholecalciferol (VITAMIN D3) 25 MCG (1000 UNIT) tablet Take 1,000 Units by mouth daily.     clopidogrel (PLAVIX) 75 MG tablet Take 1 tablet (75 mg total) by mouth daily. 30 tablet 3   clotrimazole (LOTRIMIN) 1 % cream Apply 1 application topically once a week.      nortriptyline (PAMELOR) 10 MG capsule Take 10 mg by mouth at bedtime.      polyethylene glycol (MIRALAX / GLYCOLAX) 17 g packet Take 17 g by mouth daily as needed for mild constipation.     sildenafil (VIAGRA) 100 MG tablet Take 100 mg by mouth daily as needed for erectile dysfunction.     tamsulosin (FLOMAX) 0.4 MG CAPS capsule Take 0.4 mg by mouth daily.      traMADol (ULTRAM) 50 MG tablet Take 1 tablet (50 mg total) by mouth every 6 (six) hours as  needed. 8 tablet 0   No current facility-administered medications for this visit.    No Known Allergies   REVIEW OF SYSTEMS:   [X]  denotes positive finding, [ ]  denotes negative finding Cardiac  Comments:  Chest pain or chest pressure:    Shortness of breath upon exertion:    Short of breath when lying flat:    Irregular heart rhythm:        Vascular    Pain in calf, thigh, or hip brought on by ambulation:    Pain in feet at night that wakes you up from your sleep:     Blood clot in your veins:    Leg  swelling:         Pulmonary    Oxygen at home:    Productive cough:     Wheezing:         Neurologic    Sudden weakness in arms or legs:     Sudden numbness in arms or legs:     Sudden onset of difficulty speaking or slurred speech:    Temporary loss of vision in one eye:     Problems with dizziness:         Gastrointestinal    Blood in stool:     Vomited blood:         Genitourinary    Burning when urinating:     Blood in urine:        Psychiatric    Major depression:         Hematologic    Bleeding problems:    Problems with blood clotting too easily:        Skin    Rashes or ulcers:        Constitutional    Fever or chills:      PHYSICAL EXAMINATION:  Vitals:   03/05/20 1015  BP: 140/77  Pulse: 100  Resp: 20  Temp: 97.6 F (36.4 C)  SpO2: 97%   General:  WDWN in NAD; vital signs documented above Gait: Not observed HENT: WNL, normocephalic Pulmonary: normal non-labored breathing , without Rales, rhonchi,  wheezing Cardiac: regular HR, without  Murmurs without carotid bruit Abdomen: soft, NT, no masses Skin: without rashes Vascular Exam/Pulses: 2+ radial pulses bilaterally.  2+ right femoral pulse 1+ left femoral pulse. Extremities: without acute ischemic changes, without Gangrene , without cellulitis; with open wounds; both feet are warm and well perfused.  Mild nonpitting edema.  His left toe amputation site is healing without signs of  infection.  Full-thickness approximately 1/2 cm ulcer overlying the medial right first metatarsal head.  This was probed with sterile cotton-tipped applicator.  No significant tunneling.  Clear serous drainage. Musculoskeletal: no muscle wasting or atrophy  Neurologic: A&O X 3;  No focal weakness or paresthesias are detected Psychiatric:  The pt has Normal affect.          Non-Invasive Vascular Imaging:   03/05/2020 Right lower extremity duplex: A focal velocity elevation of 180 cm/s was obtained at prox-mid SFA with post stenotic turbulence with a VR of 1.6. Findings are characteristic of  30-49% stenosis.  Triphasic waveforms through the level of the popliteal artery.  Monophasic waveforms of the distal anterior tibial artery, posterior tibial artery and peroneal artery.  Triphasic waveform through patent right popliteal artery stent.  Left lower extremity duplex: No evidence of stenosis in the left lower extremity arterial system.   ABI/TBI Today's ABI Today's TBI Previous ABI Previous TBI   +-------+-----------+-----------+------------+------------+   Right  0.75     0.39     0.57     0.48       +-------+-----------+-----------+------------+------------+   Left   0.77            0.71       Monophasic waveforms. Right great toe pressure 55   ASSESSMENT/PLAN:: 86 y.o. male here for follow up for bilateral lower extremity aortogram with intervention.  Improvement noted in right ABI.  His left foot amputation site is healing without signs of infection.  Nylon sutures removed today.  Full-thickness right medial foot wound with clear serous drainage.  No evidence of infection or cellulitis.  Will  obtain home health referral for right foot wound dressings.  Would recommend alginate dressing or similar for exudative wound.  Follow-up in the office in 1 month to inspect this wound and amputation site.  Follow-up in 6 months with bilateral lower extremity  duplex and ABIs.  I discussed with the patient and his son calling our office should they have concerns regarding changes in wounds.  Barbie Banner, PA-C [facility] [phone]  Clinic MD:   Donzetta Matters

## 2020-03-09 ENCOUNTER — Other Ambulatory Visit: Payer: Self-pay

## 2020-03-09 DIAGNOSIS — I739 Peripheral vascular disease, unspecified: Secondary | ICD-10-CM

## 2020-03-15 ENCOUNTER — Telehealth: Payer: Self-pay

## 2020-03-15 NOTE — Telephone Encounter (Signed)
Called Mr. Pokorski to f/u from our conversation on 03/10/20 in regards to his [facility] nurse giving me a call for updated wound care orders. He asked "Did you not receive a call" I stated no, he then asked me to call his daughter and she would be able to give me to give the name of the agency as he did not know or know anyone to contact. Before I could call his daughter I received a voice mail from Mr. Castilla and called him back, he gave me Nunzio Cobbs., RN's phone number and stated he had just got some info out of the mail box with her name and number on it. Called and left a message to return my call.Returned [employer] phone call - She stated that a nurse did go out on 03/04/20 and do an eval only and "everything looked good" nurse has not been back out since. Lovie Macadamia wound care orders from pt's last OV, stated she would put the orders in and they would contact the pt and began care again.

## 2020-04-02 ENCOUNTER — Other Ambulatory Visit: Payer: Self-pay

## 2020-04-02 ENCOUNTER — Ambulatory Visit (INDEPENDENT_AMBULATORY_CARE_PROVIDER_SITE_OTHER): Payer: No Typology Code available for payment source | Admitting: Physician Assistant

## 2020-04-02 VITALS — BP 111/73 | HR 79 | Temp 97.7°F | Resp 20 | Ht 73.0 in | Wt 195.0 lb

## 2020-04-02 DIAGNOSIS — I739 Peripheral vascular disease, unspecified: Secondary | ICD-10-CM | POA: Diagnosis not present

## 2020-04-02 DIAGNOSIS — S91301D Unspecified open wound, right foot, subsequent encounter: Secondary | ICD-10-CM | POA: Diagnosis not present

## 2020-04-02 MED ORDER — SULFAMETHOXAZOLE-TRIMETHOPRIM 400-80 MG PO TABS: 1.0000 | ORAL_TABLET | Freq: Two times a day (BID) | ORAL | 0 refills | Status: DC

## 2020-04-02 NOTE — Progress Notes (Signed)
POST OPERATIVE OFFICE NOTE    CC:  F/u for left foot wound  HPI:  This is a 87 y.o. male who is s/p right and left aortography. He presented with bilateral foot wounds. He underwent left second toe amputation on 02/24/2020.  Previous left great toe amputation by Dr. Sharol Given on 10/01/2019. He ambulates with rolling walker.  He denies claudication or rest pain.  His son accompanies him today. His amputation site was healing nicely, but he had a full-thickness right wound that was draining  colorless, thin fluid and home health was arranged. He denies fever, chills or malaise.  01/19/2020: right popliteal artery Tigris stent; right popliteal and ATA laser atherectomy; plain balloon angioplasty of right ATA  02/23/2020: left peroneal artery laser atherectomy and plain balloon angioplasty    No Known Allergies  Current Outpatient Medications  Medication Sig Dispense Refill  . albuterol (VENTOLIN HFA) 108 (90 Base) MCG/ACT inhaler Inhale 2 puffs into the lungs every 6 (six) hours as needed for wheezing or shortness of breath.    . allopurinol (ZYLOPRIM) 300 MG tablet Take 300 mg by mouth daily.     . Alogliptin Benzoate 12.5 MG TABS Take 6.25 mg by mouth daily.    [name] amLODipine (NORVASC) 10 MG tablet Take 1 tablet (10 mg total) by mouth daily. 90 tablet 3  . aspirin EC 81 MG tablet Take 81 mg by mouth daily. Swallow whole.    [name] atorvastatin (LIPITOR) 10 MG tablet Take 1 tablet (10 mg total) by mouth every evening. 30 tablet 6  . carvedilol (COREG) 3.125 MG tablet Take 3.125 mg by mouth 2 (two) times daily with a meal.    . cholecalciferol (VITAMIN D3) 25 MCG (1000 UNIT) tablet Take 1,000 Units by mouth daily.    . clopidogrel (PLAVIX) 75 MG tablet Take 1 tablet (75 mg total) by mouth daily. 30 tablet 3  . clotrimazole (LOTRIMIN) 1 % cream Apply 1 application topically once a week.    . nortriptyline (PAMELOR) 10 MG capsule Take 10 mg by mouth at bedtime.     . polyethylene glycol (MIRALAX /  GLYCOLAX) 17 g packet Take 17 g by mouth daily as needed for mild constipation.    . sildenafil (VIAGRA) 100 MG tablet Take 100 mg by mouth daily as needed for erectile dysfunction.    . tamsulosin (FLOMAX) 0.4 MG CAPS capsule Take 0.4 mg by mouth daily.     . traMADol (ULTRAM) 50 MG tablet Take 1 tablet (50 mg total) by mouth every 6 (six) hours as needed. 8 tablet 0   No current facility-administered medications for this visit.     ROS:  See HPI  Vitals:   04/02/20 0917  BP: 111/73  Pulse: 79  Resp: 20  Temp: 97.7 F (36.5 C)  SpO2: 99%   Physical Exam:  General appearance: WD, WN in NAD Cardiac: RRR Respiratory: nonlabored Incision:  Left foot incision well healed Extremities: both feet are warm with intact sensation and motor function. Small area of skin loss over IP joint of left third toe.  Positive bone test of overlying wound of  right 1st metatarsal head. Still with clear drainage Neuro: alert and oriented times 4  LEFT FOOT    RIGHT FOOT    Assessment/Plan:  This is a 87 y.o. male with chronic left foot wound and exposed bone. Dr. Donzetta Matters examined patient and explained nature of wound and presumption of likely bone infection of head of 1st metatarsal.  He  explained that very likely patient would need 1st great toe/ray amputation.  Patient opted for two weeks of antibiotics and re-assess for any improvement prior to proceeding with amputation.  Bactrim DS BID times 14 days.  Prescription for [facility] bilateral foot orthoses.  Patient requests changing his home health care agency.  He does not recall the name of the new agency, but provided fax number for referral: [phone]. He will need continued wound care for right foot as well as monitoring new left 3rd toe skin breakdown.  Follow-up with Dr Donzetta Matters in two weeks. Patient and his son are strongly urged to call our office should the appearance of the wound or drainage changes prior to next visit or if he develops  new symptoms, fever, chills or malaise.  Risa Grill, PA-C [facility] [phone]  Clinic MD:  Donzetta Matters

## 2020-04-16 ENCOUNTER — Other Ambulatory Visit: Payer: Self-pay

## 2020-04-16 ENCOUNTER — Encounter: Payer: Self-pay | Admitting: Vascular Surgery

## 2020-04-16 ENCOUNTER — Ambulatory Visit (INDEPENDENT_AMBULATORY_CARE_PROVIDER_SITE_OTHER): Payer: Non-veteran care | Admitting: Vascular Surgery

## 2020-04-16 VITALS — BP 130/81 | HR 83 | Temp 97.0°F | Resp 18 | Ht 73.0 in | Wt 191.0 lb

## 2020-04-16 DIAGNOSIS — I739 Peripheral vascular disease, unspecified: Secondary | ICD-10-CM

## 2020-04-16 DIAGNOSIS — S91301D Unspecified open wound, right foot, subsequent encounter: Secondary | ICD-10-CM | POA: Diagnosis not present

## 2020-04-16 NOTE — Progress Notes (Signed)
Patient ID: [name]., male   DOB: [birth year], 87 y.o.   MRN: [id number]  Reason for Consult: PAD and Wound Check   Referred by [name], MD  Subjective:     HPI:  Yasmin Dibello. is a 87 y.o. male with history of bilateral lower extremity endovascular interventions.  We have been following a wound on his right great toe and the metatarsal phalangeal joint.  Patient denies any fevers or chills.  Overall he has continued walking he has not had any wound care he does have a home health nurse who has been helping.  Patient has no acute issues related to today's visit.  He is on aspirin, Plavix and a statin.  Past Medical History:  Diagnosis Date  . Acute lower GI bleeding    Recurrent: 2011, 2012  . Bladder cancer ([place])    Transitional cell, 2001  . Bradycardia 01/20/2020  . CAD (coronary atherosclerotic disease)    Nonobstructive, 05/2012  . Exertional dyspnea    EF 67-34%, grade 1 diastolic dysfunction, 03/9377  . HLD (hyperlipidemia)   . HTN (hypertension)   . Neuropathy   . Other insomnia   . PAD (peripheral artery disease) ([place])    ABIs, 2009: 0.89 right; 0.99 left  . Type 2 diabetes mellitus ([place])   . Valvular heart disease   . Vertigo    Family History  Problem Relation Age of Onset  . Heart attack Mother   . Heart attack Father   . Heart attack Sister   . Heart attack Sister   . Heart attack Sister   . Heart attack Sister    Past Surgical History:  Procedure Laterality Date  . ABDOMINAL AORTOGRAM W/LOWER EXTREMITY N/A 01/19/2020   Procedure: ABDOMINAL AORTOGRAM W/ Bilateral LOWER EXTREMITY [place];  Surgeon: Waynetta Sandy, MD;  Location: [facility];  Service: Cardiovascular;  Laterality: N/A;  . AMPUTATION Left 10/01/2019   Procedure: LEFT GREAT TOE AMPUTATION AT METATARSOPHALANGEAL JOINT;  Surgeon: [name], MD;  Location: [facility];  Service: Orthopedics;  Laterality: Left;  . AMPUTATION Left 02/24/2020   Procedure: LEFT  SECOND TOE AMPUTATION;  Surgeon: Waynetta Sandy, MD;  Location: [place];  Service: Vascular;  Laterality: Left;  . BIV PACEMAKER INSERTION CRT-P N/A 02/11/2020   Procedure: BIV PACEMAKER INSERTION CRT-P;  Surgeon: Thompson Grayer, MD;  Location: [place];  Service: Cardiovascular;  Laterality: N/A;  . CATARACT EXTRACTION     bi lateral  . COLONOSCOPY    . HEMORRHOID SURGERY    . INGUINAL HERNIA REPAIR    . LOWER EXTREMITY ANGIOGRAPHY  02/23/2020   Procedure: Lower Extremity Angiography;  Surgeon: Waynetta Sandy, MD;  Location: [facility];  Service: Cardiovascular;;  . PERIPHERAL VASCULAR ATHERECTOMY Left 02/23/2020   Procedure: PERIPHERAL VASCULAR ATHERECTOMY;  Surgeon: Waynetta Sandy, MD;  Location: [facility];  Service: Cardiovascular;  Laterality: Left;  . PERIPHERAL VASCULAR BALLOON ANGIOPLASTY  02/23/2020   Procedure: PERIPHERAL VASCULAR BALLOON ANGIOPLASTY;  Surgeon: Waynetta Sandy, MD;  Location: [facility];  Service: Cardiovascular;;  left Peroneal   . PERIPHERAL VASCULAR INTERVENTION Right 01/19/2020   Procedure: PERIPHERAL VASCULAR INTERVENTION;  Surgeon: Waynetta Sandy, MD;  Location: [facility] [facility];  Service: Cardiovascular;  Laterality: Right;    Short Social History:  Social History   Tobacco Use  . Smoking status: Former Smoker    Quit date: 04/19/1972    Years  since quitting: 48.0  . Smokeless tobacco: Never Used  Substance Use Topics  . Alcohol use: Never    No Known Allergies  Current Outpatient Medications  Medication Sig Dispense Refill  . albuterol (VENTOLIN HFA) 108 (90 Base) MCG/ACT inhaler Inhale 2 puffs into the lungs every 6 (six) hours as needed for wheezing or shortness of breath.    . allopurinol (ZYLOPRIM) 300 MG tablet Take 300 mg by mouth daily.     . Alogliptin Benzoate 12.5 MG TABS Take 6.25 mg by mouth daily.    [name] amLODipine (NORVASC) 10 MG tablet Take 1 tablet  (10 mg total) by mouth daily. 90 tablet 3  . aspirin EC 81 MG tablet Take 81 mg by mouth daily. Swallow whole.    [name] atorvastatin (LIPITOR) 10 MG tablet Take 1 tablet (10 mg total) by mouth every evening. 30 tablet 6  . carvedilol (COREG) 3.125 MG tablet Take 3.125 mg by mouth 2 (two) times daily with a meal.    . cholecalciferol (VITAMIN D3) 25 MCG (1000 UNIT) tablet Take 1,000 Units by mouth daily.    . clopidogrel (PLAVIX) 75 MG tablet Take 1 tablet (75 mg total) by mouth daily. 30 tablet 3  . clotrimazole (LOTRIMIN) 1 % cream Apply 1 application topically once a week.    . nortriptyline (PAMELOR) 10 MG capsule Take 10 mg by mouth at bedtime.     . polyethylene glycol (MIRALAX / GLYCOLAX) 17 g packet Take 17 g by mouth daily as needed for mild constipation.    . sildenafil (VIAGRA) 100 MG tablet Take 100 mg by mouth daily as needed for erectile dysfunction.    . tamsulosin (FLOMAX) 0.4 MG CAPS capsule Take 0.4 mg by mouth daily.     . traMADol (ULTRAM) 50 MG tablet Take 1 tablet (50 mg total) by mouth every 6 (six) hours as needed. 8 tablet 0  . sulfamethoxazole-trimethoprim (BACTRIM) 400-80 MG tablet Take 1 tablet by mouth 2 (two) times daily. (Patient not taking: Reported on 04/16/2020) 28 tablet 0   No current facility-administered medications for this visit.    Review of Systems  Constitutional:  Constitutional negative. HENT: HENT negative.  Eyes: Eyes negative.  Respiratory: Respiratory negative.  Cardiovascular: Cardiovascular negative.  GI: Gastrointestinal negative.  Skin: Positive for wound.  Neurological: Neurological negative. Hematologic: Hematologic/lymphatic negative.  Psychiatric: Psychiatric negative.        Objective:  Objective   Vitals:   04/16/20 1402  BP: 130/81  Pulse: 83  Resp: 18  Temp: (!) 97 F (36.1 C)  TempSrc: Temporal  SpO2: 95%  Weight: 191 lb (86.6 kg)  Height: 6\' 1"  (1.854 m)   Body mass index is 25.2 kg/m.  Physical Exam HENT:      Head: Normocephalic.     Nose:     Comments: Wearing a mask Eyes:     Pupils: Pupils are equal, round, and reactive to light.  Cardiovascular:     Rate and Rhythm: Normal rate.     Pulses:          Popliteal pulses are 0 on the right side and 0 on the left side.     Comments: monophasic right anterior tibial can be traced onto the foot distally Left peroneal signal is strongest Pulmonary:     Effort: Pulmonary effort is normal.  Abdominal:     General: Abdomen is flat.     Palpations: Abdomen is soft.  Musculoskeletal:  General: No swelling. Normal range of motion.     Cervical back: Normal range of motion.  Skin:    Capillary Refill: Capillary refill takes less than 2 seconds.  Neurological:     General: No focal deficit present.     Mental Status: He is alert.  Psychiatric:        Mood and Affect: Mood normal.        Behavior: Behavior normal.        Thought Content: Thought content normal.        Judgment: Judgment normal.           Assessment/Plan:     87 year old male with the above-noted issues now following a wound on the right foot.  Does not currently appear infected although has been slow to heal.  We will refer to the wound care center.  He will follow-up in 6 months with repeat studies.  All questions were answered today.     Waynetta Sandy MD [facility]

## 2020-04-19 ENCOUNTER — Other Ambulatory Visit: Payer: Self-pay

## 2020-04-19 ENCOUNTER — Telehealth: Payer: Self-pay | Admitting: Emergency Medicine

## 2020-04-19 DIAGNOSIS — E1151 Type 2 diabetes mellitus with diabetic peripheral angiopathy without gangrene: Secondary | ICD-10-CM | POA: Diagnosis not present

## 2020-04-19 DIAGNOSIS — E785 Hyperlipidemia, unspecified: Secondary | ICD-10-CM | POA: Diagnosis not present

## 2020-04-19 DIAGNOSIS — Z4781 Encounter for orthopedic aftercare following surgical amputation: Secondary | ICD-10-CM | POA: Diagnosis not present

## 2020-04-19 DIAGNOSIS — I2510 Atherosclerotic heart disease of native coronary artery without angina pectoris: Secondary | ICD-10-CM | POA: Diagnosis not present

## 2020-04-19 DIAGNOSIS — I739 Peripheral vascular disease, unspecified: Secondary | ICD-10-CM

## 2020-04-19 DIAGNOSIS — I110 Hypertensive heart disease with heart failure: Secondary | ICD-10-CM | POA: Diagnosis not present

## 2020-04-19 DIAGNOSIS — E11621 Type 2 diabetes mellitus with foot ulcer: Secondary | ICD-10-CM | POA: Diagnosis not present

## 2020-04-19 DIAGNOSIS — L97512 Non-pressure chronic ulcer of other part of right foot with fat layer exposed: Secondary | ICD-10-CM | POA: Diagnosis not present

## 2020-04-19 DIAGNOSIS — I5030 Unspecified diastolic (congestive) heart failure: Secondary | ICD-10-CM | POA: Diagnosis not present

## 2020-04-19 DIAGNOSIS — E1140 Type 2 diabetes mellitus with diabetic neuropathy, unspecified: Secondary | ICD-10-CM | POA: Diagnosis not present

## 2020-04-19 NOTE — Telephone Encounter (Signed)
LMOM to call device clinic. # and office hours provided.  Need to confirm patient will come in for 05/17/20 appointment with Dr Rayann Heman. Patient was no show for wound check after implant and 1st remote transmission just received.

## 2020-04-20 NOTE — Telephone Encounter (Signed)
Spoke with patients daughter and she confirmed they will be at the appointment 05/17/2020

## 2020-04-22 DIAGNOSIS — I110 Hypertensive heart disease with heart failure: Secondary | ICD-10-CM | POA: Diagnosis not present

## 2020-04-22 DIAGNOSIS — I2510 Atherosclerotic heart disease of native coronary artery without angina pectoris: Secondary | ICD-10-CM | POA: Diagnosis not present

## 2020-04-22 DIAGNOSIS — I5030 Unspecified diastolic (congestive) heart failure: Secondary | ICD-10-CM | POA: Diagnosis not present

## 2020-04-22 DIAGNOSIS — L97512 Non-pressure chronic ulcer of other part of right foot with fat layer exposed: Secondary | ICD-10-CM | POA: Diagnosis not present

## 2020-04-22 DIAGNOSIS — E1151 Type 2 diabetes mellitus with diabetic peripheral angiopathy without gangrene: Secondary | ICD-10-CM | POA: Diagnosis not present

## 2020-04-22 DIAGNOSIS — Z4781 Encounter for orthopedic aftercare following surgical amputation: Secondary | ICD-10-CM | POA: Diagnosis not present

## 2020-04-22 DIAGNOSIS — E1140 Type 2 diabetes mellitus with diabetic neuropathy, unspecified: Secondary | ICD-10-CM | POA: Diagnosis not present

## 2020-04-22 DIAGNOSIS — E785 Hyperlipidemia, unspecified: Secondary | ICD-10-CM | POA: Diagnosis not present

## 2020-04-22 DIAGNOSIS — E11621 Type 2 diabetes mellitus with foot ulcer: Secondary | ICD-10-CM | POA: Diagnosis not present

## 2020-04-27 DIAGNOSIS — I2510 Atherosclerotic heart disease of native coronary artery without angina pectoris: Secondary | ICD-10-CM | POA: Diagnosis not present

## 2020-04-27 DIAGNOSIS — I110 Hypertensive heart disease with heart failure: Secondary | ICD-10-CM | POA: Diagnosis not present

## 2020-04-27 DIAGNOSIS — E11621 Type 2 diabetes mellitus with foot ulcer: Secondary | ICD-10-CM | POA: Diagnosis not present

## 2020-04-27 DIAGNOSIS — E785 Hyperlipidemia, unspecified: Secondary | ICD-10-CM | POA: Diagnosis not present

## 2020-04-27 DIAGNOSIS — Z4781 Encounter for orthopedic aftercare following surgical amputation: Secondary | ICD-10-CM | POA: Diagnosis not present

## 2020-04-27 DIAGNOSIS — E1151 Type 2 diabetes mellitus with diabetic peripheral angiopathy without gangrene: Secondary | ICD-10-CM | POA: Diagnosis not present

## 2020-04-27 DIAGNOSIS — I5030 Unspecified diastolic (congestive) heart failure: Secondary | ICD-10-CM | POA: Diagnosis not present

## 2020-04-27 DIAGNOSIS — L97512 Non-pressure chronic ulcer of other part of right foot with fat layer exposed: Secondary | ICD-10-CM | POA: Diagnosis not present

## 2020-04-27 DIAGNOSIS — E1140 Type 2 diabetes mellitus with diabetic neuropathy, unspecified: Secondary | ICD-10-CM | POA: Diagnosis not present

## 2020-04-29 DIAGNOSIS — L97512 Non-pressure chronic ulcer of other part of right foot with fat layer exposed: Secondary | ICD-10-CM | POA: Diagnosis not present

## 2020-04-29 DIAGNOSIS — Z4781 Encounter for orthopedic aftercare following surgical amputation: Secondary | ICD-10-CM | POA: Diagnosis not present

## 2020-04-29 DIAGNOSIS — E1140 Type 2 diabetes mellitus with diabetic neuropathy, unspecified: Secondary | ICD-10-CM | POA: Diagnosis not present

## 2020-04-29 DIAGNOSIS — E11621 Type 2 diabetes mellitus with foot ulcer: Secondary | ICD-10-CM | POA: Diagnosis not present

## 2020-04-29 DIAGNOSIS — I5030 Unspecified diastolic (congestive) heart failure: Secondary | ICD-10-CM | POA: Diagnosis not present

## 2020-04-29 DIAGNOSIS — I110 Hypertensive heart disease with heart failure: Secondary | ICD-10-CM | POA: Diagnosis not present

## 2020-04-29 DIAGNOSIS — E1151 Type 2 diabetes mellitus with diabetic peripheral angiopathy without gangrene: Secondary | ICD-10-CM | POA: Diagnosis not present

## 2020-04-29 DIAGNOSIS — I2510 Atherosclerotic heart disease of native coronary artery without angina pectoris: Secondary | ICD-10-CM | POA: Diagnosis not present

## 2020-04-29 DIAGNOSIS — E785 Hyperlipidemia, unspecified: Secondary | ICD-10-CM | POA: Diagnosis not present

## 2020-05-04 DIAGNOSIS — L97512 Non-pressure chronic ulcer of other part of right foot with fat layer exposed: Secondary | ICD-10-CM | POA: Diagnosis not present

## 2020-05-04 DIAGNOSIS — Z4781 Encounter for orthopedic aftercare following surgical amputation: Secondary | ICD-10-CM | POA: Diagnosis not present

## 2020-05-04 DIAGNOSIS — E1151 Type 2 diabetes mellitus with diabetic peripheral angiopathy without gangrene: Secondary | ICD-10-CM | POA: Diagnosis not present

## 2020-05-04 DIAGNOSIS — I110 Hypertensive heart disease with heart failure: Secondary | ICD-10-CM | POA: Diagnosis not present

## 2020-05-04 DIAGNOSIS — E1140 Type 2 diabetes mellitus with diabetic neuropathy, unspecified: Secondary | ICD-10-CM | POA: Diagnosis not present

## 2020-05-04 DIAGNOSIS — E11621 Type 2 diabetes mellitus with foot ulcer: Secondary | ICD-10-CM | POA: Diagnosis not present

## 2020-05-04 DIAGNOSIS — E785 Hyperlipidemia, unspecified: Secondary | ICD-10-CM | POA: Diagnosis not present

## 2020-05-04 DIAGNOSIS — I2510 Atherosclerotic heart disease of native coronary artery without angina pectoris: Secondary | ICD-10-CM | POA: Diagnosis not present

## 2020-05-04 DIAGNOSIS — I5030 Unspecified diastolic (congestive) heart failure: Secondary | ICD-10-CM | POA: Diagnosis not present

## 2020-05-06 DIAGNOSIS — I110 Hypertensive heart disease with heart failure: Secondary | ICD-10-CM | POA: Diagnosis not present

## 2020-05-06 DIAGNOSIS — I2510 Atherosclerotic heart disease of native coronary artery without angina pectoris: Secondary | ICD-10-CM | POA: Diagnosis not present

## 2020-05-06 DIAGNOSIS — Z4781 Encounter for orthopedic aftercare following surgical amputation: Secondary | ICD-10-CM | POA: Diagnosis not present

## 2020-05-06 DIAGNOSIS — E11621 Type 2 diabetes mellitus with foot ulcer: Secondary | ICD-10-CM | POA: Diagnosis not present

## 2020-05-06 DIAGNOSIS — I5030 Unspecified diastolic (congestive) heart failure: Secondary | ICD-10-CM | POA: Diagnosis not present

## 2020-05-06 DIAGNOSIS — E1151 Type 2 diabetes mellitus with diabetic peripheral angiopathy without gangrene: Secondary | ICD-10-CM | POA: Diagnosis not present

## 2020-05-06 DIAGNOSIS — E785 Hyperlipidemia, unspecified: Secondary | ICD-10-CM | POA: Diagnosis not present

## 2020-05-06 DIAGNOSIS — L97512 Non-pressure chronic ulcer of other part of right foot with fat layer exposed: Secondary | ICD-10-CM | POA: Diagnosis not present

## 2020-05-06 DIAGNOSIS — E1140 Type 2 diabetes mellitus with diabetic neuropathy, unspecified: Secondary | ICD-10-CM | POA: Diagnosis not present

## 2020-05-10 ENCOUNTER — Other Ambulatory Visit (HOSPITAL_COMMUNITY)
Admission: RE | Admit: 2020-05-10 | Discharge: 2020-05-10 | Disposition: A | Discharge status: Home / Self Care | Payer: Medicare HMO | Attending: Internal Medicine | Admitting: Internal Medicine

## 2020-05-10 ENCOUNTER — Ambulatory Visit (HOSPITAL_COMMUNITY)
Admission: RE | Admit: 2020-05-10 | Discharge: 2020-05-10 | Disposition: A | Discharge status: Home / Self Care | Payer: Medicare HMO | Source: Ambulatory Visit | Attending: Internal Medicine | Admitting: Internal Medicine

## 2020-05-10 ENCOUNTER — Encounter (HOSPITAL_BASED_OUTPATIENT_CLINIC_OR_DEPARTMENT_OTHER): Payer: Medicare HMO | Attending: Internal Medicine | Admitting: Internal Medicine

## 2020-05-10 ENCOUNTER — Other Ambulatory Visit: Payer: Self-pay

## 2020-05-10 ENCOUNTER — Other Ambulatory Visit (HOSPITAL_COMMUNITY): Payer: Self-pay

## 2020-05-10 DIAGNOSIS — L97509 Non-pressure chronic ulcer of other part of unspecified foot with unspecified severity: Secondary | ICD-10-CM | POA: Insufficient documentation

## 2020-05-10 DIAGNOSIS — M869 Osteomyelitis, unspecified: Secondary | ICD-10-CM | POA: Insufficient documentation

## 2020-05-10 DIAGNOSIS — L97514 Non-pressure chronic ulcer of other part of right foot with necrosis of bone: Secondary | ICD-10-CM | POA: Insufficient documentation

## 2020-05-10 DIAGNOSIS — E11621 Type 2 diabetes mellitus with foot ulcer: Secondary | ICD-10-CM | POA: Insufficient documentation

## 2020-05-10 DIAGNOSIS — E1169 Type 2 diabetes mellitus with other specified complication: Secondary | ICD-10-CM | POA: Insufficient documentation

## 2020-05-10 DIAGNOSIS — Z950 Presence of cardiac pacemaker: Secondary | ICD-10-CM | POA: Insufficient documentation

## 2020-05-10 DIAGNOSIS — E1140 Type 2 diabetes mellitus with diabetic neuropathy, unspecified: Secondary | ICD-10-CM | POA: Diagnosis not present

## 2020-05-10 DIAGNOSIS — Z89412 Acquired absence of left great toe: Secondary | ICD-10-CM | POA: Diagnosis not present

## 2020-05-10 DIAGNOSIS — E1151 Type 2 diabetes mellitus with diabetic peripheral angiopathy without gangrene: Secondary | ICD-10-CM | POA: Insufficient documentation

## 2020-05-10 DIAGNOSIS — L97519 Non-pressure chronic ulcer of other part of right foot with unspecified severity: Secondary | ICD-10-CM | POA: Diagnosis not present

## 2020-05-10 DIAGNOSIS — L97518 Non-pressure chronic ulcer of other part of right foot with other specified severity: Secondary | ICD-10-CM | POA: Diagnosis not present

## 2020-05-10 DIAGNOSIS — Z888 Allergy status to other drugs, medicaments and biological substances status: Secondary | ICD-10-CM | POA: Insufficient documentation

## 2020-05-11 NOTE — Progress Notes (Signed)
Peter James, Peter James ([id number]) Visit Report for 05/10/2020 Abuse/Suicide Risk Screen Details Patient Name: Date of Service: Peter James 05/10/2020 1:15 PM Medical Record Number: [id number] Patient Account Number: [id number] Date of Birth/Sex: Treating RN: 1925-08-23 (87 y.o. Peter James, Peter James Primary Care Provider: Jerene James Other Clinician: Referring Provider: Treating Provider/Extender: Peter James Weeks in Treatment: 0 Abuse/Suicide Risk Screen Items Answer ABUSE RISK SCREEN: Has anyone close to you tried to hurt or harm you recentlyo No Do you feel uncomfortable with anyone in your familyo No Has anyone forced you do things that you didnt want to doo No Electronic Signature(s) Signed: 05/11/2020 5:43:17 PM By: Peter Hammock RN Entered By: Peter James on 05/10/2020 13:13:35 -------------------------------------------------------------------------------- Activities of Daily Living Details Patient Name: Date of Service: Peter James 05/10/2020 1:15 PM Medical Record Number: [id number] Patient Account Number: [id number] Date of Birth/Sex: Treating RN: 1925-07-19 (87 y.o. Peter James Primary Care Provider: Jerene James Other Clinician: Referring Provider: Treating Provider/Extender: Peter James Weeks in Treatment: 0 Activities of Daily Living Items Answer Activities of Daily Living (Please select one for each item) Drive Automobile Completely Able T Medications ake Completely Able Use T elephone Completely Able Care for Appearance Completely Able Use T oilet Completely Able Bath / Shower Completely Able Dress Self Completely Able Feed Self Completely Able Walk Completely Able Get In / Out Bed Completely Able Housework Completely Able Prepare Meals Completely Peter James for Self Completely Able Electronic Signature(s) Signed: 05/11/2020 5:43:17 PM By: Peter Hammock RN Entered  By: Peter James on 05/10/2020 13:14:46 -------------------------------------------------------------------------------- Education Screening Details Patient Name: Date of Service: Peter James 05/10/2020 1:15 PM Medical Record Number: [id number] Patient Account Number: [id number] Date of Birth/Sex: Treating RN: 12-18-1925 (87 y.o. Peter James, Peter James Primary Care Provider: Jerene James Other Clinician: Referring Provider: Treating Provider/Extender: Peter James Weeks in Treatment: 0 Primary Learner Assessed: Patient Learning Preferences/Education Level/Primary Language Learning Preference: Explanation, Demonstration, Communication Board, Printed Material Highest Education Level: College or Above Preferred Language: English Cognitive Barrier Language Barrier: No Translator Needed: No Memory Deficit: No Emotional Barrier: No Cultural/Religious Beliefs Affecting Medical Care: No Physical Barrier Impaired Vision: Yes Glasses Impaired Hearing: No Decreased Hand dexterity: No Knowledge/Comprehension Knowledge Level: High Comprehension Level: High Ability to understand written instructions: High Ability to understand verbal instructions: High Motivation Anxiety Level: Calm Cooperation: Cooperative Education Importance: Denies Need Interest in Health Problems: Asks Questions Perception: Coherent Willingness to Engage in Self-Management High Activities: Readiness to Engage in Self-Management High Activities: Electronic Signature(s) Signed: 05/11/2020 5:43:17 PM By: Peter Hammock RN Entered By: Peter James on 05/10/2020 13:15:23 -------------------------------------------------------------------------------- Fall Risk Assessment Details Patient Name: Date of Service: Peter James 05/10/2020 1:15 PM Medical Record Number: [id number] Patient Account Number: [id number] Date of Birth/Sex: Treating RN: Apr 08, 1925 (87 y.o. Peter James,  Peter James Primary Care Provider: Jerene James Other Clinician: Referring Provider: Treating Provider/Extender: Peter James Weeks in Treatment: 0 Fall Risk Assessment Items Have you had 2 or more falls in the last 12 monthso 0 No Have you had any fall that resulted in injury in the last 12 monthso 0 No FALLS RISK SCREEN History of falling - immediate or within 3 months 0 No Secondary diagnosis (Do you have 2 or more medical diagnoseso) 0 No Ambulatory aid None/bed rest/wheelchair/nurse 0 No Crutches/cane/walker 0 No Furniture 0 No Intravenous therapy Access/Saline/Heparin Lock 0 No Gait/Transferring Normal/ bed rest/ wheelchair 0 No  Weak (short steps with or without shuffle, stooped but able to lift head while walking, may seek 0 No support from furniture) Impaired (short steps with shuffle, may have difficulty arising from chair, head down, impaired 0 No balance) Mental Status Oriented to own ability 0 No Electronic Signature(s) Signed: 05/11/2020 5:43:17 PM By: Peter Hammock RN Entered By: Peter James on 05/10/2020 13:13:44 -------------------------------------------------------------------------------- Foot Assessment Details Patient Name: Date of Service: Peter James 05/10/2020 1:15 PM Medical Record Number: [id number] Patient Account Number: [id number] Date of Birth/Sex: Treating RN: 25-Dec-1925 (87 y.o. Peter James Primary Care Provider: Jerene James Other Clinician: Referring Provider: Treating Provider/Extender: Peter James Weeks in Treatment: 0 Foot Assessment Items Site Locations + = Sensation present, - = Sensation absent, C = Callus, U = Ulcer R = Redness, W = Warmth, M = Maceration, PU = Pre-ulcerative lesion F = Fissure, S = Swelling, D = Dryness Assessment Right: Left: Other Deformity: No No Prior Foot Ulcer: No No Prior Amputation: No No Charcot Joint: No No Ambulatory Status: Ambulatory With  Help Assistance Device: Cane Gait: Steady Electronic Signature(s) Signed: 05/11/2020 5:43:17 PM By: Peter Hammock RN Entered By: Peter James on 05/10/2020 13:47:23 -------------------------------------------------------------------------------- Nutrition Risk Screening Details Patient Name: Date of Service: Peter James 05/10/2020 1:15 PM Medical Record Number: [id number] Patient Account Number: [id number] Date of Birth/Sex: Treating RN: Nov 19, 1925 (87 y.o. Peter James, Peter James Primary Care Provider: Jerene James Other Clinician: Referring Provider: Treating Provider/Extender: Ernestina Patches, [name] Weeks in Treatment: 0 Height (in): 73 Weight (lbs): 217 Body Mass Index (BMI): 28.6 Nutrition Risk Screening Items Score Screening NUTRITION RISK SCREEN: I have an illness or condition that made me change the kind and/or amount of food I eat 0 No I eat fewer than two meals per day 0 No I eat few fruits and vegetables, or milk products 0 No I have three or more drinks of beer, liquor or wine almost every day 0 No I have tooth or mouth problems that make it hard for me to eat 0 No I don't always have enough money to buy the food I need 0 No I eat alone most of the time 0 No I take three or more different prescribed or over-the-counter drugs a day 0 No Without wanting to, I have lost or gained 10 pounds in the last six months 0 No I am not always physically able to shop, cook and/or feed myself 0 No Nutrition Protocols Good Risk Protocol 0 No interventions needed Moderate Risk Protocol High Risk Proctocol Risk Level: Good Risk Score: 0 Electronic Signature(s) Signed: 05/11/2020 5:43:17 PM By: Peter Hammock RN Entered By: Peter James on 05/10/2020 13:13:53

## 2020-05-11 NOTE — Progress Notes (Signed)
Peter James ([id number]) Visit Report for 05/10/2020 Chief Complaint Document Details Patient Name: Date of Service: Peter James 05/10/2020 1:15 PM Medical Record Number: [id number] Patient Account Number: [id number] Date of Birth/Sex: Treating RN: 10/16/1925 (87 y.o. Peter James Primary Care Provider: Jerene James Other Clinician: Referring Provider: Treating Provider/Extender: Peter James, Peter James Weeks in Treatment: 0 Information Obtained from: Patient Chief Complaint 05/10/2020; patient is here for review of a wound on the right medial first met head Electronic Signature(s) Signed: 05/10/2020 5:55:48 PM By: Peter James Entered By: Peter James on 05/10/2020 14:13:55 -------------------------------------------------------------------------------- HPI Details Patient Name: Date of Service: Peter James 05/10/2020 1:15 PM Medical Record Number: [id number] Patient Account Number: [id number] Date of Birth/Sex: Treating RN: 25-Apr-1925 (87 y.o. Peter James Primary Care Provider: Jerene James Other Clinician: Referring Provider: Treating Provider/Extender: Peter James Weeks in Treatment: 0 History of Present Illness HPI Description: ADMISSION 05/10/2020 This is a independent 87 year old man who has had an area on his right medial first metatarsal head according to him for roughly 3 months. This started as a skin tear that scab he removed the scab and inside an area on this since. He is a type II diabetic also had an area on his left second toe and right first metatarsal head in November so this wound may have been present longer than not. He has undergone extensive arterial review including a stent in the right popliteal laser atherectomy of the right popliteal and anterior tibial arteries on 01/19/2020. On 02/23/2020 he had a laser atherectomy of the left peroneal angioplasty of the left peroneal and amputation of the left second toe.  Follow-up noninvasive studies on 03/05/2020 showed an ABI on the right of 0.75 with a TBI of 0.39. On the left his ABI was 0.77, his left great toe was amputated. He is not experiencing any pain. ALSO he had a right knee skin tear within the last week or 2. He is not really dressing either 1 of these areas Past medical; pacemaker, type 2 diabetes with PAD, osteomyelitis of the left first toe amputated on 09/29/2019, hypertension hypercholesterolemia, COPD Electronic Signature(s) Signed: 05/10/2020 5:55:48 PM By: Peter James Entered By: Peter James on 05/10/2020 14:17:25 -------------------------------------------------------------------------------- Physical Exam Details Patient Name: Date of Service: Peter James 05/10/2020 1:15 PM Medical Record Number: [id number] Patient Account Number: [id number] Date of Birth/Sex: Treating RN: 1926/01/22 (87 y.o. Peter James Primary Care Provider: Jerene James Other Clinician: Referring Provider: Treating Provider/Extender: Peter James Weeks in Treatment: 0 Constitutional Patient is hypertensive.. Supine Blood Pressure is within target range for patient.. Pulse regular and within target range for patient.[name] Respirations regular, non- labored and within target range.[name] Appears in no distress. Cardiovascular Popliteal pulses are palpable. Pedal pulses not palpable on the right. Notes Wound exam; right medial first metatarsal head. This area looks small and healthy however there is considerable tunneling around this wound. Both medially and laterally. This also probes to bone. Is not obvious on a superficial glance. There is no obvious surrounding infection no tenderness. Electronic Signature(s) Signed: 05/10/2020 5:55:48 PM By: Peter James Entered By: Peter James on 05/10/2020 14:26:03 -------------------------------------------------------------------------------- Physician Orders Details Patient Name: Date  of Service: Peter James 05/10/2020 1:15 PM Medical Record Number: [id number] Patient Account Number: [id number] Date of Birth/Sex: Treating RN: 21-Sep-1925 (87 y.o. Peter James Primary Care Provider: Jerene James Other Clinician: Referring Provider: Treating Provider/Extender: Peter James,  [name] Weeks in Treatment: 0 Verbal / Phone Orders: No Diagnosis Coding Follow-up Appointments Return Appointment in 1 week. Bathing/ Shower/ Hygiene May shower with protection but do not get wound dressing(s) wet. Off-Loading Open toe surgical shoe to: - right foot Home Health New wound care orders this week; continue Home Health for wound care. May utilize formulary equivalent dressing for wound treatment orders unless otherwise specified. - Change primary dressing to silver alginate Dressing changes to be completed by [place] on Monday / Wednesday / Friday except when patient has scheduled visit at [place] clinic will change on Monday, home health to change on Wednesday and Friday Other [facility] Orders/Instructions: - [facility] Wound Treatment Wound #1 - Foot Wound Laterality: Right, Medial Cleanser: Wound Cleanser ([place]) 3 x Per Week/7 Days Discharge Instructions: Cleanse the wound with wound cleanser or normal saline prior to applying a clean dressing using gauze sponges, not tissue or cotton balls. Prim Dressing: KerraCel Ag Gelling Fiber Dressing, 2x2 in (silver alginate) (Home Health) 3 x Per Week/7 Days ary Discharge Instructions: lightly pack into tunneling/undermining Secondary Dressing: Woven Gauze Sponges 2x2 in (Home Health) 3 x Per Week/7 Days Discharge Instructions: Apply over primary dressing as directed. Secondary Dressing: Optifoam Non-Adhesive Dressing, 4x4 in (Home Health) 3 x Per Week/7 Days Discharge Instructions: Foam donut Secured With: Kerlix Roll Sterile, 4.5x3.1 (in/yd) ([place]) 3 x Per Week/7 Days Discharge  Instructions: Secure with Kerlix as directed. Secured With: Paper Tape, 2x10 (in/yd) (Home Health) 3 x Per Week/7 Days Discharge Instructions: Secure dressing with tape as directed. Wound #2 - Knee Wound Laterality: Right Cleanser: Wound Cleanser (Home Health) 1 x Per Day/7 Days Discharge Instructions: Cleanse the wound with wound cleanser or normal saline prior to applying a clean dressing using gauze sponges, not tissue or cotton balls. Topical: Triple antibiotic ointment 1 x Per Day/7 Days Discharge Instructions: Apply to right knee daily Secondary Dressing: ComfortFoam Border, 4x4 in (silicone border) ([place]) 1 x Per Day/7 Days Discharge Instructions: Apply over primary dressing as directed. Laboratory naerobe culture (MICRO) - Right medial foot Bacteria identified in Unspecified specimen by A LOINC Code: 169-6 Convenience Name: Anerobic culture Radiology X-ray, right foot, complete view - Non healing wound on right medial foot. ICD: E11.621 Electronic Signature(s) Signed: 05/10/2020 5:41:15 PM By: Levan Hurst RN, BSN Signed: 05/10/2020 5:55:48 PM By: Peter James Entered By: Levan Hurst on 05/10/2020 14:19:41 Prescription 05/10/2020 -------------------------------------------------------------------------------- Collene Leyden James Patient Name: Provider: 1925/12/06 [id number] Date of Birth: NPI#: Jerilynn Mages [id number] Sex: DEA #: [phone] [id number] Phone #: License #: [place] Patient Address: [address] [place], [place] [address] [place], [place] [address] [phone] Allergies No Known Allergies Provider's Orders X-ray, right foot, complete view - Non healing wound on right medial foot. ICD: E11.621 Hand Signature: Date(s): Electronic Signature(s) Signed: 05/10/2020 5:41:15 PM By: Levan Hurst RN, BSN Signed: 05/10/2020 5:55:48 PM By: Peter James Entered By: Levan Hurst on 05/10/2020 14:19:41 -------------------------------------------------------------------------------- Problem List Details Patient Name: Date of Service: Peter James 05/10/2020 1:15 PM Medical Record Number: [id number] Patient Account Number: [id number] Date of Birth/Sex: Treating RN: 10/07/1925 (87 y.o. Peter James Primary Care Provider: Jerene James Other Clinician: Referring Provider: Treating Provider/Extender: Peter James Weeks in Treatment: 0 Active Problems ICD-10 Encounter Code Description Active Date MDM Diagnosis E11.621 Type 2 diabetes mellitus with foot ulcer 05/10/2020 No Yes  E11.51 Type 2 diabetes mellitus with diabetic peripheral angiopathy without gangrene 05/10/2020 No Yes L97.514 Non-pressure chronic ulcer of other part of right foot with necrosis of bone 05/10/2020 No Yes S80.211D Abrasion, right knee, subsequent encounter 05/10/2020 No Yes Inactive Problems Resolved Problems Electronic Signature(s) Signed: 05/10/2020 5:55:48 PM By: Peter James Entered By: Peter James on 05/10/2020 14:12:40 -------------------------------------------------------------------------------- Progress Note Details Patient Name: Date of Service: Peter James 05/10/2020 1:15 PM Medical Record Number: [id number] Patient Account Number: [id number] Date of Birth/Sex: Treating RN: 1926-03-20 (87 y.o. Peter James Primary Care Provider: Jerene James Other Clinician: Referring Provider: Treating Provider/Extender: Peter James, Peter James Weeks in Treatment: 0 Subjective Chief Complaint Information obtained from Patient 05/10/2020; patient is here for review of a wound on the right medial first met head History of Present Illness (HPI) ADMISSION 05/10/2020 This is a independent 87 year old man who has had an area on his right medial first metatarsal head according to him for roughly 3 months. This started as a skin tear that scab he  removed the scab and inside an area on this since. He is a type II diabetic also had an area on his left second toe and right first metatarsal head in November so this wound may have been present longer than not. He has undergone extensive arterial review including a stent in the right popliteal laser atherectomy of the right popliteal and anterior tibial arteries on 01/19/2020. On 02/23/2020 he had a laser atherectomy of the left peroneal angioplasty of the left peroneal and amputation of the left second toe. Follow-up noninvasive studies on 03/05/2020 showed an ABI on the right of 0.75 with a TBI of 0.39. On the left his ABI was 0.77, his left great toe was amputated. He is not experiencing any pain. ALSO he had a right knee skin tear within the last week or 2. He is not really dressing either 1 of these areas Past medical; pacemaker, type 2 diabetes with PAD, osteomyelitis of the left first toe amputated on 09/29/2019, hypertension hypercholesterolemia, COPD Patient History Information obtained from Patient. Allergies No Known Allergies Family History Heart Disease - Mother,Father,Siblings, No family history of Cancer, Diabetes, Hereditary Spherocytosis, Hypertension, Kidney Disease, Lung Disease, Seizures, Stroke, Thyroid Problems, Tuberculosis. Social History Former smoker, Marital Status - Widowed, Alcohol Use - Never, Drug Use - No History, Caffeine Use - Rarely - coffee. Medical History Eyes Denies history of Cataracts, Glaucoma, Optic Neuritis Ear/Nose/Mouth/Throat Denies history of Chronic sinus problems/congestion, Middle ear problems Hematologic/Lymphatic Denies history of Anemia, Hemophilia, Human Immunodeficiency Virus, Lymphedema, Sickle Cell Disease Respiratory Denies history of Aspiration, Asthma, Chronic Obstructive Pulmonary Disease (COPD), Pneumothorax, Sleep Apnea, Tuberculosis Cardiovascular Patient has history of Coronary Artery Disease, Hypertension, Peripheral  Arterial Disease Denies history of Angina, Arrhythmia, Congestive Heart Failure, Deep Vein Thrombosis, Hypotension, Myocardial Infarction, Peripheral Venous Disease, Phlebitis, Vasculitis Gastrointestinal Denies history of Cirrhosis , Colitis, Crohnoos, Hepatitis A, Hepatitis B, Hepatitis C Endocrine Patient has history of Type II Diabetes Denies history of Type I Diabetes Genitourinary Denies history of End Stage Renal Disease Immunological Denies history of Lupus Erythematosus, Raynaudoos, Scleroderma Integumentary (Skin) Denies history of History of Burn Musculoskeletal Denies history of Gout, Rheumatoid Arthritis, Osteoarthritis, Osteomyelitis Neurologic Patient has history of Neuropathy Denies history of Dementia, Quadriplegia, Paraplegia, Seizure Disorder Oncologic Denies history of Received Chemotherapy, Received Radiation Psychiatric Denies history of Anorexia/bulimia, Confinement Anxiety Patient is treated with Oral Agents. Blood sugar is tested. Hospitalization/Surgery History - left great toe amputation. - left 2nd toe  amputation. - cataract surgery. - hermorrhoid surgery. - hernia repair. - peripheral vascular atherectomy. - peripheral vascular balloon angioplasty. - peripheral vasculAR intervention. Medical A Surgical History Notes nd Respiratory exertional dyspnea Cardiovascular hx of bradycardia, hyperlipidemia Neurologic hx of vertigo Review of Systems (ROS) Constitutional Symptoms (General Health) Denies complaints or symptoms of Fatigue, Fever, Chills, Marked Weight Change. Eyes Complains or has symptoms of Glasses / Contacts. Denies complaints or symptoms of Dry Eyes, Vision Changes. Ear/Nose/Mouth/Throat Denies complaints or symptoms of Chronic sinus problems or rhinitis. Respiratory Denies complaints or symptoms of Chronic or frequent coughs, Shortness of Breath. Cardiovascular Denies complaints or symptoms of Chest pain. Gastrointestinal Denies  complaints or symptoms of Frequent diarrhea, Nausea, Vomiting. Endocrine Denies complaints or symptoms of Heat/cold intolerance. Genitourinary Denies complaints or symptoms of Frequent urination. Musculoskeletal Denies complaints or symptoms of Muscle Pain, Muscle Weakness. Neurologic Denies complaints or symptoms of Numbness/parasthesias. Psychiatric Denies complaints or symptoms of Claustrophobia, Suicidal. Objective Constitutional Patient is hypertensive.. Supine Blood Pressure is within target range for patient.. Pulse regular and within target range for patient.[name] Respirations regular, non- labored and within target range.[name] Appears in no distress. Vitals Time Taken: 1:13 PM, Height: 73 in, Source: Stated, Weight: 217 lbs, Source: Stated, BMI: 28.6, Temperature: 97.8 F, Pulse: 87 bpm, Respiratory Rate: 17 breaths/min, Blood Pressure: 157/84 mmHg. Cardiovascular Popliteal pulses are palpable. Pedal pulses not palpable on the right. General Notes: Wound exam; right medial first metatarsal head. This area looks small and healthy however there is considerable tunneling around this wound. Both medially and laterally. This also probes to bone. Is not obvious on a superficial glance. There is no obvious surrounding infection no tenderness. Integumentary (Hair, Skin) Wound #1 status is Open. Original cause of wound was Blister. The date acquired was: 02/18/2020. The wound is located on the Right,Medial Foot. The wound measures 0.6cm length x 0.2cm width x 0.8cm depth; 0.094cm^2 area and 0.075cm^3 volume. Tunneling has been noted at 12:00 with a maximum distance of 2cm. There is additional tunneling and at 5:00 with a maximum distance of 1.8cm. Undermining begins at 12:00 and ends at 12:00 with a maximum distance of 1.4cm. There is a medium amount of serosanguineous drainage noted. The wound margin is distinct with the outline attached to the wound base. There is large (67-100%) red, pink  granulation within the wound bed. There is a small (1-33%) amount of necrotic tissue within the wound bed including Adherent Slough. Wound #2 status is Open. Original cause of wound was Skin T ear/Laceration. The date acquired was: 04/30/2020. The wound is located on the Right Knee. The wound measures 1.9cm length x 1.5cm width x 0.1cm depth; 2.238cm^2 area and 0.224cm^3 volume. There is no tunneling or undermining noted. There is a medium amount of serosanguineous drainage noted. The wound margin is distinct with the outline attached to the wound base. There is large (67-100%) red, pink granulation within the wound bed. There is no necrotic tissue within the wound bed. Assessment Active Problems ICD-10 Type 2 diabetes mellitus with foot ulcer Type 2 diabetes mellitus with diabetic peripheral angiopathy without gangrene Non-pressure chronic ulcer of other part of right foot with necrosis of bone Abrasion, right knee, subsequent encounter Plan Follow-up Appointments: Return Appointment in 1 week. Bathing/ Shower/ Hygiene: May shower with protection but do not get wound dressing(s) wet. Off-Loading: Open toe surgical shoe to: - right foot Home Health: New wound care orders this week; continue Home Health for wound care. May utilize formulary equivalent dressing for wound  treatment orders unless otherwise specified. - Change primary dressing to silver alginate Dressing changes to be completed by [place] on Monday / Wednesday / Friday except when patient has scheduled visit at [facility] clinic will change on Monday, home health to change on Wednesday and Friday Other [facility] ordered were: Anerobic culture - Right medial foot Radiology ordered were: X-ray, right foot, complete view - Non healing wound on right medial foot. ICD: E11.621 WOUND #1: - Foot Wound Laterality: Right, Medial Cleanser: Wound Cleanser (Home Health) 3 x  Per Week/7 Days Discharge Instructions: Cleanse the wound with wound cleanser or normal saline prior to applying a clean dressing using gauze sponges, not tissue or cotton balls. Prim Dressing: KerraCel Ag Gelling Fiber Dressing, 2x2 in (silver alginate) (Home Health) 3 x Per Week/7 Days ary Discharge Instructions: lightly pack into tunneling/undermining Secondary Dressing: Woven Gauze Sponges 2x2 in (Home Health) 3 x Per Week/7 Days Discharge Instructions: Apply over primary dressing as directed. Secondary Dressing: Optifoam Non-Adhesive Dressing, 4x4 in (Home Health) 3 x Per Week/7 Days Discharge Instructions: Foam donut Secured With: Kerlix Roll Sterile, 4.5x3.1 (in/yd) ([place]) 3 x Per Week/7 Days Discharge Instructions: Secure with Kerlix as directed. Secured With: Paper T ape, 2x10 (in/yd) (Home Health) 3 x Per Week/7 Days Discharge Instructions: Secure dressing with tape as directed. WOUND #2: - Knee Wound Laterality: Right Cleanser: Wound Cleanser (Home Health) 1 x Per Day/7 Days Discharge Instructions: Cleanse the wound with wound cleanser or normal saline prior to applying a clean dressing using gauze sponges, not tissue or cotton balls. Topical: Triple antibiotic ointment 1 x Per Day/7 Days Discharge Instructions: Apply to right knee daily Secondary Dressing: ComfortFoam Border, 4x4 in (silicone border) ([place]) 1 x Per Day/7 Days Discharge Instructions: Apply over primary dressing as directed. 1. Deep probing wound to bone with considerable amount of tunneling around this. 2. I have done a culture/swab culture no empiric antibiotics 3. He will need an x-ray of this area as one has not been done since July 2021. 4. I am concerned that there may be underlying osteomyelitis here. It is not obvious that this is acutely ischemic but certainly his PAD may be playing a role in the nonhealing of this area. He is not really been dressing this area 5. He will need a surgical  shoe I have spent 35 minutes in review of this patient's past medical history, face-to-face evaluation and preparation of this record Electronic Signature(s) Signed: 05/10/2020 5:55:48 PM By: Peter James Entered By: Peter James on 05/10/2020 14:29:23 -------------------------------------------------------------------------------- HxROS Details Patient Name: Date of Service: Peter James 05/10/2020 1:15 PM Medical Record Number: [id number] Patient Account Number: [id number] Date of Birth/Sex: Treating RN: May 01, 1925 (87 y.o. [name] Primary Care Provider: Jerene James Other Clinician: Referring Provider: Treating Provider/Extender: Peter James Weeks in Treatment: 0 Information Obtained From Patient Constitutional Symptoms (General Health) Complaints and Symptoms: Negative for: Fatigue; Fever; Chills; Marked Weight Change Eyes Complaints and Symptoms: Positive for: Glasses / Contacts Negative for: Dry Eyes; Vision Changes Medical History: Negative for: Cataracts; Glaucoma; Optic Neuritis Ear/Nose/Mouth/Throat Complaints and Symptoms: Negative for: Chronic sinus problems or rhinitis Medical History: Negative for: Chronic sinus problems/congestion; Middle ear problems Respiratory Complaints and Symptoms: Negative for: Chronic or frequent coughs; Shortness of Breath Medical History: Negative for: Aspiration; Asthma; Chronic Obstructive Pulmonary Disease (COPD); Pneumothorax; Sleep Apnea; Tuberculosis Past Medical History Notes: exertional dyspnea Cardiovascular Complaints and  Symptoms: Negative for: Chest pain Medical History: Positive for: Coronary Artery Disease; Hypertension; Peripheral Arterial Disease Negative for: Angina; Arrhythmia; Congestive Heart Failure; Deep Vein Thrombosis; Hypotension; Myocardial Infarction; Peripheral Venous Disease; Phlebitis; Vasculitis Past Medical History Notes: hx of bradycardia,  hyperlipidemia Gastrointestinal Complaints and Symptoms: Negative for: Frequent diarrhea; Nausea; Vomiting Medical History: Negative for: Cirrhosis ; Colitis; Crohns; Hepatitis A; Hepatitis B; Hepatitis C Endocrine Complaints and Symptoms: Negative for: Heat/cold intolerance Medical History: Positive for: Type II Diabetes Negative for: Type I Diabetes Time with diabetes: 5 year Treated with: Oral agents Blood sugar tested every day: Yes Tested : Genitourinary Complaints and Symptoms: Negative for: Frequent urination Medical History: Negative for: End Stage Renal Disease Musculoskeletal Complaints and Symptoms: Negative for: Muscle Pain; Muscle Weakness Medical History: Negative for: Gout; Rheumatoid Arthritis; Osteoarthritis; Osteomyelitis Neurologic Complaints and Symptoms: Negative for: Numbness/parasthesias Medical History: Positive for: Neuropathy Negative for: Dementia; Quadriplegia; Paraplegia; Seizure Disorder Past Medical History Notes: hx of vertigo Psychiatric Complaints and Symptoms: Negative for: Claustrophobia; Suicidal Medical History: Negative for: Anorexia/bulimia; Confinement Anxiety Hematologic/Lymphatic Medical History: Negative for: Anemia; Hemophilia; Human Immunodeficiency Virus; Lymphedema; Sickle Cell Disease Immunological Medical History: Negative for: Lupus Erythematosus; Raynauds; Scleroderma Integumentary (Skin) Medical History: Negative for: History of Burn Oncologic Medical History: Negative for: Received Chemotherapy; Received Radiation Immunizations Pneumococcal Vaccine: Received Pneumococcal Vaccination: Yes Immunization Notes: pt. doesn't remember last tetanus shot Implantable Devices None Hospitalization / Surgery History Type of Hospitalization/Surgery left great toe amputation left 2nd toe amputation cataract surgery hermorrhoid surgery hernia repair peripheral vascular atherectomy peripheral vascular balloon  angioplasty peripheral vasculAR intervention Family and Social History Cancer: No; Diabetes: No; Heart Disease: Yes - Mother,Father,Siblings; Hereditary Spherocytosis: No; Hypertension: No; Kidney Disease: No; Lung Disease: No; Seizures: No; Stroke: No; Thyroid Problems: No; Tuberculosis: No; Former smoker; Marital Status - Widowed; Alcohol Use: Never; Drug Use: No History; Caffeine Use: Rarely - coffee; Financial Concerns: No; Food, Clothing or Shelter Needs: No; Support System Lacking: No; Transportation Concerns: No Engineer, maintenance) Signed: 05/10/2020 5:55:48 PM By: Peter James Signed: 05/11/2020 5:43:17 PM By: Rhae Hammock RN Entered By: Rhae Hammock on 05/10/2020 13:27:53 -------------------------------------------------------------------------------- SuperBill Details Patient Name: Date of Service: Peter James 05/10/2020 Medical Record Number: [id number] Patient Account Number: [id number] Date of Birth/Sex: Treating RN: 1925-06-19 (87 y.o. Peter James Primary Care Provider: Jerene James Other Clinician: Referring Provider: Treating Provider/Extender: Peter James Weeks in Treatment: 0 Diagnosis Coding ICD-10 Codes Code Description E11.621 Type 2 diabetes mellitus with foot ulcer E11.51 Type 2 diabetes mellitus with diabetic peripheral angiopathy without gangrene L97.514 Non-pressure chronic ulcer of other part of right foot with necrosis of bone S80.211D Abrasion, right knee, subsequent encounter Facility Procedures CPT4 Code: [id number] Description: [phone] - WOUND CARE VISIT-LEV 5 EST PT Modifier: Quantity: 1 Physician Procedures : CPT4 Code Description Modifier [id number] WC PHYS LEVEL 3 NEW PT ICD-10 Diagnosis Description E11.621 Type 2 diabetes mellitus with foot ulcer E11.51 Type 2 diabetes mellitus with diabetic peripheral angiopathy without gangrene L97.514 Non-pressure  chronic ulcer of other part of right foot with  necrosis of bone S80.211D Abrasion, right knee, subsequent encounter Quantity: 1 Electronic Signature(s) Signed: 05/10/2020 5:55:48 PM By: Peter James Entered By: Peter James on 05/10/2020 14:30:02

## 2020-05-11 NOTE — Progress Notes (Signed)
Peter, James ([id number]) Visit Report for 05/10/2020 Allergy List Details Patient Name: Date of Service: Peter James 05/10/2020 1:15 PM Medical Record Number: [id number] Patient Account Number: [id number] Date of Birth/Sex: Treating RN: 1925-07-05 (87 y.o. [name] Primary Care Provider: [name] Other Clinician: Referring Provider: Treating Provider/Extender: Ernestina Patches, [name] Weeks in Treatment: 0 Allergies Active Allergies No Known Allergies Allergy Notes Electronic Signature(s) Signed: 05/11/2020 5:43:17 PM By: Rhae Hammock RN Entered By: Rhae Hammock on 05/10/2020 13:13:23 -------------------------------------------------------------------------------- Arrival Information Details Patient Name: Date of Service: [name] 05/10/2020 1:15 PM Medical Record Number: [id number] Patient Account Number: [id number] Date of Birth/Sex: Treating RN: Jul 25, 1925 (87 y.o. [name] Primary Care Provider: [name] Other Clinician: Referring Provider: Treating Provider/Extender: Greta Doom Weeks in Treatment: 0 Visit Information Patient Arrived: [name] Time: 13:12 Accompanied By: family Transfer Assistance: None Patient Identification Verified: Yes Secondary Verification Process Completed: Yes Patient Requires Transmission-Based Precautions: No Patient Has Alerts: Yes Patient Alerts: ABI's 12/21: R:0.75 L0.77 Electronic Signature(s) Signed: 05/11/2020 5:43:17 PM By: Rhae Hammock RN Entered By: Rhae Hammock on 05/10/2020 13:45:36 -------------------------------------------------------------------------------- Clinic Level of Care Assessment Details Patient Name: Date of Service: Peter James 05/10/2020 1:15 PM Medical Record Number: [id number] Patient Account Number: [id number] Date of Birth/Sex: Treating RN: [birth year] (87 y.o. [name] Primary Care Provider: [name]  Other Clinician: Referring Provider: Treating Provider/Extender: Greta Doom Weeks in Treatment: 0 Clinic Level of Care Assessment Items TOOL 2 Quantity Score X- 1 0 Use when only an EandM is performed on the INITIAL visit ASSESSMENTS - Nursing Assessment / Reassessment X- 1 20 General Physical Exam (combine w/ comprehensive assessment (listed just below) when performed on new pt. evals) X- 1 25 Comprehensive Assessment (HX, ROS, Risk Assessments, Wounds Hx, etc.) ASSESSMENTS - Wound and Skin A ssessment / Reassessment []  - 0 Simple Wound Assessment / Reassessment - one wound X- 2 5 Complex Wound Assessment / Reassessment - multiple wounds []  - 0 Dermatologic / Skin Assessment (not related to wound area) ASSESSMENTS - Ostomy and/or Continence Assessment and Care []  - 0 Incontinence Assessment and Management []  - 0 Ostomy Care Assessment and Management (repouching, etc.) PROCESS - Coordination of Care X - Simple Patient / Family Education for ongoing care 1 15 []  - 0 Complex (extensive) Patient / Family Education for ongoing care X- 1 10 Staff obtains Programmer, systems, Records, T Results / Process Orders est X- 1 10 Staff telephones HHA, Nursing Homes / Clarify orders / etc []  - 0 Routine Transfer to another Facility (non-emergent condition) []  - 0 Routine Hospital Admission (non-emergent condition) X- 1 15 New Admissions / Biomedical engineer / Ordering NPWT Apligraf, etc. , []  - 0 Emergency Hospital Admission (emergent condition) X- 1 10 Simple Discharge Coordination []  - 0 Complex (extensive) Discharge Coordination PROCESS - Special Needs []  - 0 Pediatric / Minor Patient Management []  - 0 Isolation Patient Management []  - 0 Hearing / Language / Visual special needs []  - 0 Assessment of Community assistance (transportation, D/C planning, etc.) []  - 0 Additional assistance / Altered mentation []  - 0 Support Surface(s) Assessment (bed, cushion,  seat, etc.) INTERVENTIONS - Wound Cleansing / Measurement X- 1 5 Wound Imaging (photographs - any number of wounds) []  - 0 Wound Tracing (instead of photographs) []  - 0 Simple Wound Measurement - one wound X- 2 5 Complex Wound Measurement - multiple wounds []  - 0 Simple Wound Cleansing -  one wound X- 2 5 Complex Wound Cleansing - multiple wounds INTERVENTIONS - Wound Dressings X - Small Wound Dressing one or multiple wounds 2 10 []  - 0 Medium Wound Dressing one or multiple wounds []  - 0 Large Wound Dressing one or multiple wounds []  - 0 Application of Medications - injection INTERVENTIONS - Miscellaneous []  - 0 External ear exam X- 1 5 Specimen Collection (cultures, biopsies, blood, body fluids, etc.) X- 1 5 Specimen(s) / Culture(s) sent or taken to Lab for analysis []  - 0 Patient Transfer (multiple staff / Harrel Lemon Lift / Similar devices) []  - 0 Simple Staple / Suture removal (25 or less) []  - 0 Complex Staple / Suture removal (26 or more) []  - 0 Hypo / Hyperglycemic Management (close monitor of Blood Glucose) []  - 0 Ankle / Brachial Index (ABI) - do not check if billed separately Has the patient been seen at the hospital within the last three years: Yes Total Score: 170 Level Of Care: New/Established - Level 5 Electronic Signature(s) Signed: 05/10/2020 5:41:15 PM By: Levan Hurst RN, BSN Entered By: Levan Hurst on 05/10/2020 14:26:32 -------------------------------------------------------------------------------- Encounter Discharge Information Details Patient Name: Date of Service: [name] 05/10/2020 1:15 PM Medical Record Number: [id number] Patient Account Number: [id number] Date of Birth/Sex: Treating RN: Apr 18, 1925 (87 y.o. [name] Primary Care Provider: [name] Other Clinician: Referring Provider: Treating Provider/Extender: Greta Doom Weeks in Treatment: 0 Encounter Discharge Information Items Discharge  Condition: Stable Ambulatory Status: Cane Discharge Destination: Home Transportation: Private Auto Accompanied By: wife Schedule Follow-up Appointment: Yes Clinical Summary of Care: Notes surgical shoe. Electronic Signature(s) Signed: 05/10/2020 6:14:41 PM By: Deon Pilling Entered By: Deon Pilling on 05/10/2020 17:45:23 -------------------------------------------------------------------------------- Lower Extremity Assessment Details Patient Name: Date of Service: Peter James 05/10/2020 1:15 PM Medical Record Number: [id number] Patient Account Number: [id number] Date of Birth/Sex: Treating RN: 07/23/1925 (87 y.o. [name] Primary Care Provider: [name] Other Clinician: Referring Provider: Treating Provider/Extender: Greta Doom Weeks in Treatment: 0 Edema Assessment Assessed: Shirlyn Goltz: No] [Right: Yes] Edema: [Left: N] [Right: o] Calf Left: Right: Point of Measurement: 42 cm From Medial Instep 36 cm Ankle Left: Right: Point of Measurement: 23 cm From Medial Instep 23 cm Knee To Floor Left: Right: From Medial Instep 46 cm Vascular Assessment Pulses: Dorsalis Pedis Palpable: [Right:Yes] Posterior Tibial Palpable: [Right:Yes] Electronic Signature(s) Signed: 05/11/2020 5:43:17 PM By: Rhae Hammock RN Entered By: Rhae Hammock on 05/10/2020 13:44:58 -------------------------------------------------------------------------------- Multi Wound Chart Details Patient Name: Date of Service: [name] 05/10/2020 1:15 PM Medical Record Number: [id number] Patient Account Number: [id number] Date of Birth/Sex: Treating RN: 03/24/1925 (87 y.o. [name] Primary Care Provider: [name] Other Clinician: Referring Provider: Treating Provider/Extender: Greta Doom Weeks in Treatment: 0 Vital Signs Height(in): 73 Pulse(bpm): 49 Weight(lbs): 217 Blood Pressure(mmHg): 157/84 Body Mass Index(BMI):  29 Temperature(F): 97.8 Respiratory Rate(breaths/min): 17 Photos: [1:No Photos Right, Medial Foot] [2:No Photos Right Knee] [N/A:N/A N/A] Wound Location: [1:Blister] [2:Skin Tear/Laceration] [N/A:N/A] Wounding Event: [1:Diabetic Wound/Ulcer of the Lower] [2:Skin Tear] [N/A:N/A] Primary Etiology: [1:Extremity Coronary Artery Disease,] [2:Coronary Artery Disease,] [N/A:N/A] Comorbid History: [1:Hypertension, Peripheral Arterial Disease, Type II Diabetes, Neuropathy 02/18/2020] [2:Hypertension, Peripheral Arterial Disease, Type II Diabetes, Neuropathy 04/30/2020] [N/A:N/A] Date Acquired: [1:0] [2:0] [N/A:N/A] Weeks of Treatment: [1:Open] [2:Open] [N/A:N/A] Wound Status: [1:0.6x0.2x0.8] [2:1.9x1.5x0.1] [N/A:N/A] Measurements L x W x D (cm) [1:0.094] [2:2.238] [N/A:N/A] A (cm) : rea [1:0.075] [2:0.224] [N/A:N/A] Volume (cm) : [1:12] Position  1 (o'clock): [1:2] Maximum Distance 1 (cm): [1:5] Position 2 (o'clock): [1:1.8] Maximum Distance 2 (cm): [1:12] Starting Position 1 (o'clock): [1:12] Ending Position 1 (o'clock): [1:1.4] Maximum Distance 1 (cm): [1:Yes] [2:No] [N/A:N/A] Tunneling: [1:Yes] [2:No] [N/A:N/A] Undermining: [1:Grade 2] [2:Full Thickness Without Exposed] [N/A:N/A] Classification: [1:Medium] [2:Support Structures Medium] [N/A:N/A] Exudate Amount: [1:Serosanguineous] [2:Serosanguineous] [N/A:N/A] Exudate Type: [1:red, brown] [2:red, brown] [N/A:N/A] Exudate Color: [1:Distinct, outline attached] [2:Distinct, outline attached] [N/A:N/A] Wound Margin: [1:Large (67-100%)] [2:Large (67-100%)] [N/A:N/A] Granulation Amount: [1:Red, Pink] [2:Red, Pink] [N/A:N/A] Granulation Quality: [1:Small (1-33%)] [2:None Present (0%)] [N/A:N/A] Necrotic Amount: [1:Fascia: No] [2:Fascia: No] [N/A:N/A] Exposed Structures: [1:Fat Layer (Subcutaneous Tissue): No Fat Layer (Subcutaneous Tissue): No Tendon: No Muscle: No Joint: No Bone: No Small (1-33%)] [2:Tendon: No Muscle: No Joint: No Bone: No  Small (1-33%)] [N/A:N/A] Treatment Notes Electronic Signature(s) Signed: 05/10/2020 5:41:15 PM By: Levan Hurst RN, BSN Signed: 05/10/2020 5:55:48 PM By: Linton Ham MD Entered By: Linton Ham on 05/10/2020 14:13:27 -------------------------------------------------------------------------------- Multi-Disciplinary Care Plan Details Patient Name: Date of Service: Peter James 05/10/2020 1:15 PM Medical Record Number: [id number] Patient Account Number: [id number] Date of Birth/Sex: Treating RN: Aug 31, 1925 (87 y.o. [name] Primary Care Provider: [name] Other Clinician: Referring Provider: Treating Provider/Extender: Greta Doom Weeks in Treatment: 0 Multidisciplinary Care Plan reviewed with physician Active Inactive Nutrition Nursing Diagnoses: Impaired glucose control: actual or potential Potential for alteratiion in Nutrition/Potential for imbalanced nutrition Goals: Patient/caregiver agrees to and verbalizes understanding of need to use nutritional supplements and/or vitamins as prescribed Date Initiated: 05/10/2020 Target Resolution Date: 06/11/2020 Goal Status: Active Patient/caregiver will maintain therapeutic glucose control Date Initiated: 05/10/2020 Target Resolution Date: 06/11/2020 Goal Status: Active Interventions: Assess HgA1c results as ordered upon admission and as needed Assess patient nutrition upon admission and as needed per policy Provide education on elevated blood sugars and impact on wound healing Provide education on nutrition Notes: Wound/Skin Impairment Nursing Diagnoses: Impaired tissue integrity Knowledge deficit related to ulceration/compromised skin integrity Goals: Patient/caregiver will verbalize understanding of skin care regimen Date Initiated: 05/10/2020 Target Resolution Date: 06/11/2020 Goal Status: Active Ulcer/skin breakdown will have a volume reduction of 30% by week 4 Date Initiated:  05/10/2020 Target Resolution Date: 06/11/2020 Goal Status: Active Interventions: Assess patient/caregiver ability to obtain necessary supplies Assess patient/caregiver ability to perform ulcer/skin care regimen upon admission and as needed Assess ulceration(s) every visit Provide education on ulcer and skin care Notes: Electronic Signature(s) Signed: 05/10/2020 5:41:15 PM By: Levan Hurst RN, BSN Entered By: Levan Hurst on 05/10/2020 14:03:14 -------------------------------------------------------------------------------- Pain Assessment Details Patient Name: Date of Service: Peter James 05/10/2020 1:15 PM Medical Record Number: [id number] Patient Account Number: [id number] Date of Birth/Sex: Treating RN: [birth year] (87 y.o. [name] Primary Care Provider: [name] Other Clinician: Referring Provider: Treating Provider/Extender: Greta Doom Weeks in Treatment: 0 Active Problems Location of Pain Severity and Description of Pain Patient Has Paino No Site Locations Pain Management and Medication Current Pain Management: Electronic Signature(s) Signed: 05/11/2020 5:43:17 PM By: Rhae Hammock RN Entered By: Rhae Hammock on 05/10/2020 13:14:02 -------------------------------------------------------------------------------- Patient/Caregiver Education Details Patient Name: Date of Service: [name] 2/21/2022andnbsp1:15 PM Medical Record Number: [id number] Patient Account Number: [id number] Date of Birth/Gender: Treating RN: [birth year] (87 y.o. [name] Primary Care Physician: [name] Other Clinician: Referring Physician: Treating Physician/Extender: Samul Dada in Treatment: 0 Education Assessment Education Provided To: Patient Education Topics Provided Elevated Blood Sugar/ Impact on Healing: Methods: Explain/Verbal Responses: State  content correctly Nutrition: Methods:  Explain/Verbal Responses: State content correctly Wound/Skin Impairment: Methods: Explain/Verbal Responses: State content correctly Electronic Signature(s) Signed: 05/10/2020 5:41:15 PM By: Levan Hurst RN, BSN Entered By: Levan Hurst on 05/10/2020 14:25:28 -------------------------------------------------------------------------------- Wound Assessment Details Patient Name: Date of Service: [name] 05/10/2020 1:15 PM Medical Record Number: [id number] Patient Account Number: [id number] Date of Birth/Sex: Treating RN: 17-Aug-1925 (87 y.o. [name], [place] Primary Care Provider: [name] Other Clinician: Referring Provider: Treating Provider/Extender: Greta Doom Weeks in Treatment: 0 Wound Status Wound Number: 1 Primary Diabetic Wound/Ulcer of the Lower Extremity Etiology: Wound Location: Right, Medial Foot Wound Open Wounding Event: Blister Status: Date Acquired: 02/18/2020 Comorbid Coronary Artery Disease, Hypertension, Peripheral Arterial Weeks Of Treatment: 0 History: Disease, Type II Diabetes, Neuropathy Clustered Wound: No Wound Measurements Length: (cm) 0.6 Width: (cm) 0.2 Depth: (cm) 0.8 Area: (cm) 0.094 Volume: (cm) 0.075 % Reduction in Area: % Reduction in Volume: Epithelialization: Small (1-33%) Tunneling: Yes Location 1 Position (o'clock): 12 Maximum Distance: (cm) 2 Location 2 Position (o'clock): 5 Maximum Distance: (cm) 1.8 Undermining: Yes Starting Position (o'clock): 12 Ending Position (o'clock): 12 Maximum Distance: (cm) 1.4 Wound Description Classification: Grade 2 Wound Margin: Distinct, outline attached Exudate Amount: Medium Exudate Type: Serosanguineous Exudate Color: red, brown Foul Odor After Cleansing: No Slough/Fibrino Yes Wound Bed Granulation Amount: Large (67-100%) Exposed Structure Granulation Quality: Red, Pink Fascia Exposed: No Necrotic Amount: Small (1-33%) Fat Layer (Subcutaneous  Tissue) Exposed: No Necrotic Quality: Adherent Slough Tendon Exposed: No Muscle Exposed: No Joint Exposed: No Bone Exposed: No Treatment Notes Wound #1 (Foot) Wound Laterality: Right, Medial Cleanser Wound Cleanser Discharge Instruction: Cleanse the wound with wound cleanser or normal saline prior to applying a clean dressing using gauze sponges, not tissue or cotton balls. Peri-Wound Care Topical Primary Dressing KerraCel Ag Gelling Fiber Dressing, 2x2 in (silver alginate) Discharge Instruction: lightly pack into tunneling/undermining Secondary Dressing Woven Gauze Sponges 2x2 in Discharge Instruction: Apply over primary dressing as directed. Optifoam Non-Adhesive Dressing, 4x4 in Discharge Instruction: Foam donut Secured With [employer] Sterile, 4.5x3.1 (in/yd) Discharge Instruction: Secure with Kerlix as directed. Paper Tape, 2x10 (in/yd) Discharge Instruction: Secure dressing with tape as directed. Compression Wrap Compression Stockings Add-Ons Notes explained the new orders, dressings, frequency of change, and when to return to wound center. patient and wife in agreement. Electronic Signature(s) Signed: 05/11/2020 5:43:17 PM By: Rhae Hammock RN Entered By: Rhae Hammock on 05/10/2020 13:52:14 -------------------------------------------------------------------------------- Wound Assessment Details Patient Name: Date of Service: Peter James 05/10/2020 1:15 PM Medical Record Number: [id number] Patient Account Number: [id number] Date of Birth/Sex: Treating RN: [birth year] (87 y.o. [name], [name] Primary Care Provider: [name] Other Clinician: Referring Provider: Treating Provider/Extender: Greta Doom Weeks in Treatment: 0 Wound Status Wound Number: 2 Primary Skin Tear Etiology: Wound Location: Right Knee Wound Open Wounding Event: Skin Tear/Laceration Status: Date Acquired: 04/30/2020 Comorbid Coronary Artery Disease,  Hypertension, Peripheral Arterial Weeks Of Treatment: 0 History: Disease, Type II Diabetes, Neuropathy Clustered Wound: No Wound Measurements Length: (cm) 1.9 Width: (cm) 1.5 Depth: (cm) 0.1 Area: (cm) 2.238 Volume: (cm) 0.224 % Reduction in Area: % Reduction in Volume: Epithelialization: Small (1-33%) Tunneling: No Undermining: No Wound Description Classification: Full Thickness Without Exposed Support Structures Wound Margin: Distinct, outline attached Exudate Amount: Medium Exudate Type: Serosanguineous Exudate Color: red, brown Foul Odor After Cleansing: No Slough/Fibrino No Wound Bed Granulation Amount: Large (67-100%) Exposed Structure Granulation Quality: Red, Pink Fascia Exposed: No Necrotic Amount: None Present (  0%) Fat Layer (Subcutaneous Tissue) Exposed: No Tendon Exposed: No Muscle Exposed: No Joint Exposed: No Bone Exposed: No Treatment Notes Wound #2 (Knee) Wound Laterality: Right Cleanser Wound Cleanser Discharge Instruction: Cleanse the wound with wound cleanser or normal saline prior to applying a clean dressing using gauze sponges, not tissue or cotton balls. Peri-Wound Care Topical Triple antibiotic ointment Discharge Instruction: Apply to right knee daily Primary Dressing Secondary Dressing ComfortFoam Border, 4x4 in (silicone border) Discharge Instruction: Apply over primary dressing as directed. Secured With Compression Wrap Compression Stockings Add-Ons Notes explained the new orders, dressings, frequency of change, and when to return to wound center. patient and wife in agreement. Electronic Signature(s) Signed: 05/11/2020 5:43:17 PM By: Rhae Hammock RN Entered By: Rhae Hammock on 05/10/2020 13:53:25 -------------------------------------------------------------------------------- Vitals Details Patient Name: Date of Service: [name] 05/10/2020 1:15 PM Medical Record Number: [id number] Patient Account Number:  [id number] Date of Birth/Sex: Treating RN: [birth year] (87 y.o. [name], [name] Primary Care Provider: [name] Other Clinician: Referring Provider: Treating Provider/Extender: Greta Doom Weeks in Treatment: 0 Vital Signs Time Taken: 13:13 Temperature (F): 97.8 Height (in): 73 Pulse (bpm): 87 Source: Stated Respiratory Rate (breaths/min): 17 Weight (lbs): 217 Blood Pressure (mmHg): 157/84 Source: Stated Reference Range: 80 - 120 mg / dl Body Mass Index (BMI): 28.6 Electronic Signature(s) Signed: 05/11/2020 5:43:17 PM By: Rhae Hammock RN Entered By: Rhae Hammock on 05/10/2020 13:13:06

## 2020-05-12 ENCOUNTER — Ambulatory Visit (INDEPENDENT_AMBULATORY_CARE_PROVIDER_SITE_OTHER): Payer: Medicare HMO

## 2020-05-12 DIAGNOSIS — I110 Hypertensive heart disease with heart failure: Secondary | ICD-10-CM | POA: Diagnosis not present

## 2020-05-12 DIAGNOSIS — E11621 Type 2 diabetes mellitus with foot ulcer: Secondary | ICD-10-CM | POA: Diagnosis not present

## 2020-05-12 DIAGNOSIS — L97512 Non-pressure chronic ulcer of other part of right foot with fat layer exposed: Secondary | ICD-10-CM | POA: Diagnosis not present

## 2020-05-12 DIAGNOSIS — Z4781 Encounter for orthopedic aftercare following surgical amputation: Secondary | ICD-10-CM | POA: Diagnosis not present

## 2020-05-12 DIAGNOSIS — I441 Atrioventricular block, second degree: Secondary | ICD-10-CM | POA: Diagnosis not present

## 2020-05-12 DIAGNOSIS — I2510 Atherosclerotic heart disease of native coronary artery without angina pectoris: Secondary | ICD-10-CM | POA: Diagnosis not present

## 2020-05-12 DIAGNOSIS — E1151 Type 2 diabetes mellitus with diabetic peripheral angiopathy without gangrene: Secondary | ICD-10-CM | POA: Diagnosis not present

## 2020-05-12 DIAGNOSIS — E785 Hyperlipidemia, unspecified: Secondary | ICD-10-CM | POA: Diagnosis not present

## 2020-05-12 DIAGNOSIS — I5030 Unspecified diastolic (congestive) heart failure: Secondary | ICD-10-CM | POA: Diagnosis not present

## 2020-05-12 DIAGNOSIS — E1140 Type 2 diabetes mellitus with diabetic neuropathy, unspecified: Secondary | ICD-10-CM | POA: Diagnosis not present

## 2020-05-13 ENCOUNTER — Telehealth: Payer: Self-pay | Admitting: Emergency Medicine

## 2020-05-13 LAB — CUP PACEART REMOTE DEVICE CHECK
Battery Remaining Longevity: 66 mo
Battery Remaining Percentage: 95.5 %
Battery Voltage: 3.01 V
Brady Statistic AP VP Percent: 5.4 %
Brady Statistic AP VS Percent: 1 %
Brady Statistic AS VP Percent: 94 %
Brady Statistic AS VS Percent: 1 %
Brady Statistic RA Percent Paced: 6.2 %
Brady Statistic RV Percent Paced: 99 %
Date Time Interrogation Session: 20220223020016
Implantable Lead Implant Date: 20211124
Implantable Lead Implant Date: 20211124
Implantable Lead Location: 753859
Implantable Lead Location: 753860
Implantable Pulse Generator Implant Date: 20211124
Lead Channel Impedance Value: 450 Ohm
Lead Channel Impedance Value: 580 Ohm
Lead Channel Pacing Threshold Amplitude: 0.5 V
Lead Channel Pacing Threshold Amplitude: 0.75 V
Lead Channel Pacing Threshold Pulse Width: 0.5 ms
Lead Channel Pacing Threshold Pulse Width: 0.5 ms
Lead Channel Sensing Intrinsic Amplitude: 1.4 mV
Lead Channel Sensing Intrinsic Amplitude: 12 mV
Lead Channel Setting Pacing Amplitude: 3.5 V
Lead Channel Setting Pacing Amplitude: 3.5 V
Lead Channel Setting Pacing Pulse Width: 0.5 ms
Lead Channel Setting Sensing Sensitivity: 4 mV
Pulse Gen Model: 2272
Pulse Gen Serial Number: 3875505

## 2020-05-13 NOTE — Telephone Encounter (Signed)
Left message on patient's answering machine for an appointment reminder on Monday 05/17/2020 with Dr. Rayann Heman.

## 2020-05-13 NOTE — Telephone Encounter (Signed)
Left appointment reminder on patient's answering machine for Monday 05/17/2020.

## 2020-05-14 DIAGNOSIS — E1140 Type 2 diabetes mellitus with diabetic neuropathy, unspecified: Secondary | ICD-10-CM | POA: Diagnosis not present

## 2020-05-14 DIAGNOSIS — E11621 Type 2 diabetes mellitus with foot ulcer: Secondary | ICD-10-CM | POA: Diagnosis not present

## 2020-05-14 DIAGNOSIS — I5030 Unspecified diastolic (congestive) heart failure: Secondary | ICD-10-CM | POA: Diagnosis not present

## 2020-05-14 DIAGNOSIS — E1151 Type 2 diabetes mellitus with diabetic peripheral angiopathy without gangrene: Secondary | ICD-10-CM | POA: Diagnosis not present

## 2020-05-14 DIAGNOSIS — Z4781 Encounter for orthopedic aftercare following surgical amputation: Secondary | ICD-10-CM | POA: Diagnosis not present

## 2020-05-14 DIAGNOSIS — L97512 Non-pressure chronic ulcer of other part of right foot with fat layer exposed: Secondary | ICD-10-CM | POA: Diagnosis not present

## 2020-05-14 DIAGNOSIS — I110 Hypertensive heart disease with heart failure: Secondary | ICD-10-CM | POA: Diagnosis not present

## 2020-05-14 DIAGNOSIS — E785 Hyperlipidemia, unspecified: Secondary | ICD-10-CM | POA: Diagnosis not present

## 2020-05-14 DIAGNOSIS — I2510 Atherosclerotic heart disease of native coronary artery without angina pectoris: Secondary | ICD-10-CM | POA: Diagnosis not present

## 2020-05-15 LAB — AEROBIC/ANAEROBIC CULTURE W GRAM STAIN (SURGICAL/DEEP WOUND): Gram Stain: NONE SEEN

## 2020-05-17 ENCOUNTER — Other Ambulatory Visit (HOSPITAL_COMMUNITY): Payer: Self-pay | Admitting: Internal Medicine

## 2020-05-17 ENCOUNTER — Ambulatory Visit (INDEPENDENT_AMBULATORY_CARE_PROVIDER_SITE_OTHER): Payer: Medicare HMO | Admitting: Internal Medicine

## 2020-05-17 ENCOUNTER — Encounter: Payer: Self-pay | Admitting: Internal Medicine

## 2020-05-17 ENCOUNTER — Other Ambulatory Visit: Payer: Self-pay

## 2020-05-17 ENCOUNTER — Encounter (HOSPITAL_BASED_OUTPATIENT_CLINIC_OR_DEPARTMENT_OTHER): Payer: Medicare HMO | Admitting: Internal Medicine

## 2020-05-17 ENCOUNTER — Other Ambulatory Visit: Payer: Self-pay | Admitting: Internal Medicine

## 2020-05-17 VITALS — BP 114/68 | HR 80 | Ht 73.0 in | Wt 194.0 lb

## 2020-05-17 DIAGNOSIS — Z888 Allergy status to other drugs, medicaments and biological substances status: Secondary | ICD-10-CM | POA: Diagnosis not present

## 2020-05-17 DIAGNOSIS — L97514 Non-pressure chronic ulcer of other part of right foot with necrosis of bone: Secondary | ICD-10-CM | POA: Diagnosis not present

## 2020-05-17 DIAGNOSIS — R001 Bradycardia, unspecified: Secondary | ICD-10-CM

## 2020-05-17 DIAGNOSIS — D6869 Other thrombophilia: Secondary | ICD-10-CM | POA: Diagnosis not present

## 2020-05-17 DIAGNOSIS — E1140 Type 2 diabetes mellitus with diabetic neuropathy, unspecified: Secondary | ICD-10-CM | POA: Diagnosis not present

## 2020-05-17 DIAGNOSIS — I442 Atrioventricular block, complete: Secondary | ICD-10-CM | POA: Diagnosis not present

## 2020-05-17 DIAGNOSIS — Z950 Presence of cardiac pacemaker: Secondary | ICD-10-CM | POA: Diagnosis not present

## 2020-05-17 DIAGNOSIS — E11621 Type 2 diabetes mellitus with foot ulcer: Secondary | ICD-10-CM | POA: Diagnosis not present

## 2020-05-17 DIAGNOSIS — L97519 Non-pressure chronic ulcer of other part of right foot with unspecified severity: Secondary | ICD-10-CM

## 2020-05-17 DIAGNOSIS — L97812 Non-pressure chronic ulcer of other part of right lower leg with fat layer exposed: Secondary | ICD-10-CM | POA: Diagnosis not present

## 2020-05-17 DIAGNOSIS — E13621 Other specified diabetes mellitus with foot ulcer: Secondary | ICD-10-CM

## 2020-05-17 DIAGNOSIS — I519 Heart disease, unspecified: Secondary | ICD-10-CM

## 2020-05-17 DIAGNOSIS — I480 Paroxysmal atrial fibrillation: Secondary | ICD-10-CM

## 2020-05-17 DIAGNOSIS — Z89412 Acquired absence of left great toe: Secondary | ICD-10-CM | POA: Diagnosis not present

## 2020-05-17 DIAGNOSIS — E1151 Type 2 diabetes mellitus with diabetic peripheral angiopathy without gangrene: Secondary | ICD-10-CM | POA: Diagnosis not present

## 2020-05-17 DIAGNOSIS — L97522 Non-pressure chronic ulcer of other part of left foot with fat layer exposed: Secondary | ICD-10-CM | POA: Diagnosis not present

## 2020-05-17 DIAGNOSIS — L97512 Non-pressure chronic ulcer of other part of right foot with fat layer exposed: Secondary | ICD-10-CM | POA: Diagnosis not present

## 2020-05-17 NOTE — Patient Instructions (Addendum)
Medication Instructions:  Your physician recommends that you continue on your current medications as directed. Please refer to the Current Medication list given to you today.  Labwork: None ordered.  Testing/Procedures: None ordered.  Follow-Up: Your physician wants you to follow-up in: 6 months with    Tommye Standard, PA-C     You will receive a reminder letter in the mail two months in advance. If you don't receive a letter, please call our office to schedule the follow-up appointment.  Remote monitoring is used to monitor your Pacemaker from home. This monitoring reduces the number of office visits required to check your device to one time per year. It allows [place] to keep an eye on the functioning of your device to ensure it is working properly. You are scheduled for a device check from home on 08/12/19. You may send your transmission at any time that day. If you have a wireless device, the transmission will be sent automatically. After your physician reviews your transmission, you will receive a postcard with your next transmission date.  Any Other Special Instructions Will Be Listed Below (If Applicable).  If you need a refill on your cardiac medications before your next appointment, please call your pharmacy.

## 2020-05-17 NOTE — Progress Notes (Signed)
Peter James, Peter James ([id number]) Visit Report for 05/17/2020 HPI Details Patient Name: Date of Service: Peter James 05/17/2020 10:00 A M Medical Record Number: [id number] Patient Account Number: [id number] Date of Birth/Sex: Treating RN: Mar 06, 1926 (87 y.o. Peter James Primary Care Provider: Jerene James Other Clinician: Referring Provider: Treating Provider/Extender: Peter James Weeks in Treatment: 1 History of Present Illness HPI Description: ADMISSION 05/10/2020 This is a independent 87 year old man who has had an area on his right medial first metatarsal head according to him for roughly 3 months. This started as a skin tear that scab he removed the scab and inside an area on this since. He is a type II diabetic also had an area on his left second toe and right first metatarsal head in November so this wound may have been present longer than not. He has undergone extensive arterial review including a stent in the right popliteal laser atherectomy of the right popliteal and anterior tibial arteries on 01/19/2020. On 02/23/2020 he had a laser atherectomy of the left peroneal angioplasty of the left peroneal and amputation of the left second toe. Follow-up noninvasive studies on 03/05/2020 showed an ABI on the right of 0.75 with a TBI of 0.39. On the left his ABI was 0.77, his left great toe was amputated. He is not experiencing any pain. ALSO he had a right knee skin tear within the last week or 2. He is not really dressing either 1 of these areas Past medical; pacemaker, type 2 diabetes with PAD, osteomyelitis of the left first toe amputated on 09/29/2019, hypertension hypercholesterolemia, COPD 2/28; this is a 87 year old man who arrives I believe with his son. He is a type II diabetic. He has a wound on the medial part of the first metatarsal head. Culture I did last time showed acid Enterobacter as well as rare Enterococcus. Will be giving him Augmentin to cover both of  these for 10 days. His x-ray did not show osteomyelitis although they did comment on possible cortical erosion along the medial aspect of the head of the first metatarsal. He will definitely need an MRI. He comes into the clinic today with a new wound on the left third toe dorsal aspect over the PIP this also has exposed bone Electronic Signature(s) Signed: 05/17/2020 6:21:54 PM By: Peter Ham MD Entered By: Peter James on 05/17/2020 18:10:06 -------------------------------------------------------------------------------- Physical Exam Details Patient Name: Date of Service: Peter Peter James 05/17/2020 10:00 A M Medical Record Number: [id number] Patient Account Number: [id number] Date of Birth/Sex: Treating RN: 07-07-1925 (87 y.o. Peter James Primary Care Provider: Jerene James Other Clinician: Referring Provider: Treating Provider/Extender: Peter James Weeks in Treatment: 1 Constitutional Sitting or standing Blood Pressure is within target range for patient.. Pulse regular and within target range for patient.[name] Respirations regular, non-labored and within target range.. Temperature is normal and within the target range for the patient.[name] Appears in no distress. Cardiovascular Faint dorsalis pedis pulses. Notes Wound exam; right first metatarsal head this easily probes to bone that feels necrotic. He has a new area on the dorsal third toe PIP which is hammered there is exposed bone here as well Electronic Signature(s) Signed: 05/17/2020 6:21:54 PM By: Peter Ham MD Entered By: Peter James on 05/17/2020 18:11:05 -------------------------------------------------------------------------------- Physician Orders Details Patient Name: Date of Service: Peter Peter James 05/17/2020 10:00 [place] Record Number: [id number] Patient Account Number: [id number] Date of Birth/Sex: Treating RN: 08/12/1925 (87 y.o. Peter James Primary  Care Provider:  Jerene James Other Clinician: Referring Provider: Treating Provider/Extender: Peter James Weeks in Treatment: 1 Verbal / Phone Orders: No Diagnosis Coding ICD-10 Coding Code Description E11.621 Type 2 diabetes mellitus with foot ulcer E11.51 Type 2 diabetes mellitus with diabetic peripheral angiopathy without gangrene L97.514 Non-pressure chronic ulcer of other part of right foot with necrosis of bone S80.211D Abrasion, right knee, subsequent encounter Follow-up Appointments Return Appointment in 1 week. Bathing/ Shower/ Hygiene May shower with protection but do not get wound dressing(s) wet. Off-Loading Open toe surgical shoe to: - right foot Home Health No change in wound care orders this week; continue Home Health for wound care. May utilize formulary equivalent dressing for wound treatment orders unless otherwise specified. Dressing changes to be completed by [place] on Monday / Wednesday / Friday except when patient has scheduled visit at [facility] will change on Monday, home health to change on Wednesday and Friday Other [place] Orders/Instructions: - [place] Wound Treatment Wound #1 - Foot Wound Laterality: Right, Medial Cleanser: Wound Cleanser ([place]) 3 x Per Week/7 Days Discharge Instructions: Cleanse the wound with wound cleanser or normal saline prior to applying a clean dressing using gauze sponges, not tissue or cotton balls. Prim Dressing: KerraCel Ag Gelling Fiber Dressing, 2x2 in (silver alginate) (Home Health) 3 x Per Week/7 Days ary Discharge Instructions: lightly pack into tunneling/undermining Secondary Dressing: Woven Gauze Sponges 2x2 in (Home Health) 3 x Per Week/7 Days Discharge Instructions: Apply over primary dressing as directed. Secondary Dressing: Optifoam Non-Adhesive Dressing, 4x4 in (Home Health) 3 x Per Week/7 Days Discharge Instructions: Foam donut Secured With: Kerlix Roll Sterile, 4.5x3.1  (in/yd) ([place]) 3 x Per Week/7 Days Discharge Instructions: Secure with Kerlix as directed. Secured With: Paper Tape, 2x10 (in/yd) (Home Health) 3 x Per Week/7 Days Discharge Instructions: Secure dressing with tape as directed. Wound #3 - T Third oe Wound Laterality: Left Cleanser: Wound Cleanser (Home Health) 3 x Per Week/7 Days Discharge Instructions: Cleanse the wound with wound cleanser or normal saline prior to applying a clean dressing using gauze sponges, not tissue or cotton balls. Prim Dressing: KerraCel Ag Gelling Fiber Dressing, 2x2 in (silver alginate) (Home Health) 3 x Per Week/7 Days ary Discharge Instructions: Apply silver alginate to wound bed as instructed Secondary Dressing: Woven Gauze Sponges 2x2 in ([place]) 3 x Per Week/7 Days Discharge Instructions: Apply over primary dressing as directed. Secured With: [employer], 4.5x3.1 (in/yd) (Home Health) 3 x Per Week/7 Days Discharge Instructions: Secure with Kerlix as directed. Secured With: Paper Tape, 2x10 (in/yd) (Home Health) 3 x Per Week/7 Days Discharge Instructions: Secure dressing with tape as directed. Radiology Computed Tomography (CT) Scan , right foot without contrast - ***URGENT*** - Non healing wound on right medial foot, rule out Osteomyelitis - (ICD10 E11.621 - Type 2 diabetes mellitus with foot ulcer) Patient Medications llergies: No Known Allergies A Notifications Medication Indication Start End wound infection 05/17/2020 Augmentin DOSE oral 875 mg-125 mg tablet - 1 tablet oral bid for 10 days Electronic Signature(s) Signed: 05/17/2020 11:03:11 AM By: Peter Ham MD Entered By: Peter James on 05/17/2020 11:03:10 Prescription 05/17/2020 -------------------------------------------------------------------------------- Collene Leyden MD Patient Name: Provider: September 04, 1925 [id number] Date of Birth: NPI#[place] Sex: DEA #: [phone] [id number] Phone  #: License #: [place] Patient Address: [address] [place], [place] [address] [place], [place] [address] [phone] Allergies  No Known Allergies Provider's Orders Computed Tomography (CT) Scan , right foot without contrast - ICD10: E11.621 - ***URGENT*** - Non healing wound on right medial foot, rule out Osteomyelitis Hand Signature: Date(s): Electronic Signature(s) Signed: 05/17/2020 6:21:54 PM By: Peter Ham MD Entered By: Peter James on 05/17/2020 11:03:11 -------------------------------------------------------------------------------- Problem List Details Patient Name: Date of Service: Elnora Morrison James 05/17/2020 10:00 A M Medical Record Number: [id number] Patient Account Number: [id number] Date of Birth/Sex: Treating RN: 08/05/1925 (87 y.o. Peter James Primary Care Provider: Other Clinician: Jerene James Referring Provider: Treating Provider/Extender: Peter James Weeks in Treatment: 1 Active Problems ICD-10 Encounter Code Description Active Date MDM Diagnosis E11.621 Type 2 diabetes mellitus with foot ulcer 05/10/2020 No Yes E11.51 Type 2 diabetes mellitus with diabetic peripheral angiopathy without gangrene 05/10/2020 No Yes L97.514 Non-pressure chronic ulcer of other part of right foot with necrosis of bone 05/10/2020 No Yes L97.518 Non-pressure chronic ulcer of other part of right foot with other specified 05/17/2020 No Yes severity S80.211D Abrasion, right knee, subsequent encounter 05/10/2020 No Yes Inactive Problems Resolved Problems Electronic Signature(s) Signed: 05/17/2020 6:21:54 PM By: Peter Ham MD Previous Signature: 05/17/2020 5:56:44 PM Version By: Levan Hurst RN, BSN Entered By: Peter James on 05/17/2020 18:07:34 -------------------------------------------------------------------------------- Progress Note Details Patient Name: Date of Service: Peter  Peter James 05/17/2020 10:00 A M Medical Record Number: [id number] Patient Account Number: [id number] Date of Birth/Sex: Treating RN: [birth year] (87 y.o. Peter James Primary Care Provider: Jerene James Other Clinician: Referring Provider: Treating Provider/Extender: Peter James Weeks in Treatment: 1 Subjective History of Present Illness (HPI) ADMISSION 05/10/2020 This is a independent 87 year old man who has had an area on his right medial first metatarsal head according to him for roughly 3 months. This started as a skin tear that scab he removed the scab and inside an area on this since. He is a type II diabetic also had an area on his left second toe and right first metatarsal head in November so this wound may have been present longer than not. He has undergone extensive arterial review including a stent in the right popliteal laser atherectomy of the right popliteal and anterior tibial arteries on 01/19/2020. On 02/23/2020 he had a laser atherectomy of the left peroneal angioplasty of the left peroneal and amputation of the left second toe. Follow-up noninvasive studies on 03/05/2020 showed an ABI on the right of 0.75 with a TBI of 0.39. On the left his ABI was 0.77, his left great toe was amputated. He is not experiencing any pain. ALSO he had a right knee skin tear within the last week or 2. He is not really dressing either 1 of these areas Past medical; pacemaker, type 2 diabetes with PAD, osteomyelitis of the left first toe amputated on 09/29/2019, hypertension hypercholesterolemia, COPD 2/28; this is a 87 year old man who arrives I believe with his son. He is a type II diabetic. He has a wound on the medial part of the first metatarsal head. Culture I did last time showed acid Enterobacter as well as rare Enterococcus. Will be giving him Augmentin to cover both of these for 10 days. His x-ray did not show osteomyelitis although they did comment on possible cortical  erosion along the medial aspect of the head of the first metatarsal. He will definitely need an MRI. He comes into the clinic today with a new wound on the left third toe dorsal aspect over the PIP this also has exposed  bone Objective Constitutional Sitting or standing Blood Pressure is within target range for patient.. Pulse regular and within target range for patient.[name] Respirations regular, non-labored and within target range.. Temperature is normal and within the target range for the patient.[name] Appears in no distress. Vitals Time Taken: 10:23 AM, Height: 73 in, Weight: 217 lbs, BMI: 28.6, Temperature: 97.8 F, Pulse: 80 bpm, Respiratory Rate: 17 breaths/min, Blood Pressure: 105/64 mmHg. Cardiovascular Faint dorsalis pedis pulses. General Notes: Wound exam; right first metatarsal head this easily probes to bone that feels necrotic. ooHe has a new area on the dorsal third toe PIP which is hammered there is exposed bone here as well Integumentary (Hair, Skin) Wound #1 status is Open. Original cause of wound was Blister. The date acquired was: 02/18/2020. The wound has been in treatment 1 weeks. The wound is located on the Right,Medial Foot. The wound measures 0.7cm length x 0.5cm width x 0.6cm depth; 0.275cm^2 area and 0.165cm^3 volume. There is bone and Fat Layer (Subcutaneous Tissue) exposed. There is no tunneling noted, however, there is undermining starting at 6:00 and ending at 12:00 with a maximum distance of 0.3cm. There is a medium amount of purulent drainage noted. The wound margin is well defined and not attached to the wound base. There is large (67-100%) red, pink granulation within the wound bed. There is a small (1-33%) amount of necrotic tissue within the wound bed including Adherent Slough. Wound #2 status is Healed - Epithelialized. Original cause of wound was Skin Tear/Laceration. The date acquired was: 04/30/2020. The wound has been in treatment 1 weeks. The wound is located on  the Right Knee. The wound measures 0cm length x 0cm width x 0cm depth; 0cm^2 area and 0cm^3 volume. There is no tunneling or undermining noted. There is a none present amount of drainage noted. The wound margin is indistinct and nonvisible. There is no granulation within the wound bed. There is no necrotic tissue within the wound bed. Wound #3 status is Open. Original cause of wound was Gradually Appeared. The date acquired was: 05/17/2020. The wound is located on the Left T Third. The oe wound measures 0.3cm length x 0.4cm width x 0.1cm depth; 0.094cm^2 area and 0.009cm^3 volume. There is Fat Layer (Subcutaneous Tissue) exposed. There is no tunneling or undermining noted. There is a small amount of serosanguineous drainage noted. The wound margin is flat and intact. There is medium (34-66%) pink granulation within the wound bed. There is no necrotic tissue within the wound bed. Assessment Active Problems ICD-10 Type 2 diabetes mellitus with foot ulcer Type 2 diabetes mellitus with diabetic peripheral angiopathy without gangrene Non-pressure chronic ulcer of other part of right foot with necrosis of bone Non-pressure chronic ulcer of other part of right foot with other specified severity Abrasion, right knee, subsequent encounter Plan Follow-up Appointments: Return Appointment in 1 week. Bathing/ Shower/ Hygiene: May shower with protection but do not get wound dressing(s) wet. Off-Loading: Open toe surgical shoe to: - right foot Home Health: No change in wound care orders this week; continue Home Health for wound care. May utilize formulary equivalent dressing for wound treatment orders unless otherwise specified. Dressing changes to be completed by [place] on Monday / Wednesday / Friday except when patient has scheduled visit at [facility] clinic will change on Monday, home health to change on Wednesday and Friday Other [place] [facility] ordered were: Computed Tomography (CT) Scan , right foot without contrast - ***URGENT*** -  Non healing wound on right medial foot, rule out Osteomyelitis The following medication(s) was prescribed: Augmentin oral 875 mg-125 mg tablet 1 tablet oral bid for 10 days for wound infection starting 05/17/2020 WOUND #1: - Foot Wound Laterality: Right, Medial Cleanser: Wound Cleanser (Home Health) 3 x Per Week/7 Days Discharge Instructions: Cleanse the wound with wound cleanser or normal saline prior to applying a clean dressing using gauze sponges, not tissue or cotton balls. Prim Dressing: KerraCel Ag Gelling Fiber Dressing, 2x2 in (silver alginate) (Home Health) 3 x Per Week/7 Days ary Discharge Instructions: lightly pack into tunneling/undermining Secondary Dressing: Woven Gauze Sponges 2x2 in (Home Health) 3 x Per Week/7 Days Discharge Instructions: Apply over primary dressing as directed. Secondary Dressing: Optifoam Non-Adhesive Dressing, 4x4 in (Home Health) 3 x Per Week/7 Days Discharge Instructions: Foam donut Secured With: Kerlix Roll Sterile, 4.5x3.1 (in/yd) ([place]) 3 x Per Week/7 Days Discharge Instructions: Secure with Kerlix as directed. Secured With: Paper T ape, 2x10 (in/yd) (Home Health) 3 x Per Week/7 Days Discharge Instructions: Secure dressing with tape as directed. WOUND #3: - T Third Wound Laterality: Left oe Cleanser: Wound Cleanser (Home Health) 3 x Per Week/7 Days Discharge Instructions: Cleanse the wound with wound cleanser or normal saline prior to applying a clean dressing using gauze sponges, not tissue or cotton balls. Prim Dressing: KerraCel Ag Gelling Fiber Dressing, 2x2 in (silver alginate) (Home Health) 3 x Per Week/7 Days ary Discharge Instructions: Apply silver alginate to wound bed as instructed Secondary Dressing: Woven Gauze Sponges 2x2 in ([place]) 3 x Per Week/7 Days Discharge Instructions: Apply over primary dressing as  directed. Secured With: [employer], 4.5x3.1 (in/yd) (Home Health) 3 x Per Week/7 Days Discharge Instructions: Secure with Kerlix as directed. Secured With: Paper T ape, 2x10 (in/yd) (Home Health) 3 x Per Week/7 Days Discharge Instructions: Secure dressing with tape as directed. 1. Augmentin 875 twice daily for 10 days in response to the cultured organisms last time 2. He is going to need an MRI of the right foot and we have ordered this. I suspect he has underlying osteomyelitis. It would be reasonably easy to get a bone culture here as well as pathology await the MRI however 3. He is already been revascularized I will need to research where we are with this. 4. He has a new wound on the left dorsal third toe 5. Applying silver alginate to both wound areas Electronic Signature(s) Signed: 05/17/2020 6:21:54 PM By: Peter Ham MD Entered By: Peter James on 05/17/2020 18:14:18 -------------------------------------------------------------------------------- SuperBill Details Patient Name: Date of Service: Lamonte Sakai Hester James 05/17/2020 Medical Record Number: [id number] Patient Account Number: [id number] Date of Birth/Sex: Treating RN: [birth year] (87 y.o. Peter James Primary Care Provider: Jerene James Other Clinician: Referring Provider: Treating Provider/Extender: Peter James Weeks in Treatment: 1 Diagnosis Coding ICD-10 Codes Code Description E11.621 Type 2 diabetes mellitus with foot ulcer E11.51 Type 2 diabetes mellitus with diabetic peripheral angiopathy without gangrene L97.514 Non-pressure chronic ulcer of other part of right foot with necrosis of bone L97.518 Non-pressure chronic ulcer of other part of right foot with other specified severity S80.211D Abrasion, right knee, subsequent encounter Facility Procedures CPT4 Code: [id number] Description: 99214 - WOUND CARE VISIT-LEV 4 EST PT Modifier: Quantity: 1 Physician Procedures Electronic  Signature(s) Signed: 05/17/2020 6:21:54 PM By: Peter Ham MD Previous Signature: 05/17/2020 5:56:44 PM Version By: Levan Hurst RN, BSN Entered By: Peter James on 05/17/2020 18:15:24

## 2020-05-17 NOTE — Progress Notes (Signed)
PCP: Peter Chroman, MD   Primary EP:  Peter James. is a 87 y.o. male who presents today for routine electrophysiology followup.  Since his pacemaker implant, the patient reports doing reasonably well.  His dizziness and fatigue are much better.  His SOB is stable.  Today, he denies symptoms of palpitations, chest pain,  lower extremity edema, dizziness, presyncope, or syncope.  The patient is otherwise without complaint today.   Past Medical History:  Diagnosis Date  . Acute lower GI bleeding    Recurrent: 2011, 2012  . Bladder cancer ([place])    Transitional cell, 2001  . Bradycardia 01/20/2020  . CAD (coronary atherosclerotic disease)    Nonobstructive, 05/2012  . Exertional dyspnea    EF 67-12%, grade 1 diastolic dysfunction, 06/5807  . HLD (hyperlipidemia)   . HTN (hypertension)   . Neuropathy   . Other insomnia   . PAD (peripheral artery disease) ([place])    ABIs, 2009: 0.89 right; 0.99 left  . Type 2 diabetes mellitus ([place])   . Valvular heart disease   . Vertigo    Past Surgical History:  Procedure Laterality Date  . ABDOMINAL AORTOGRAM W/LOWER EXTREMITY N/A 01/19/2020   Procedure: ABDOMINAL AORTOGRAM W/ Bilateral LOWER EXTREMITY [place];  Surgeon: Peter Sandy, MD;  Location: [place] [facility];  Service: Cardiovascular;  Laterality: N/A;  . AMPUTATION Left 10/01/2019   Procedure: LEFT GREAT TOE AMPUTATION AT METATARSOPHALANGEAL JOINT;  Surgeon: Peter Minion, MD;  Location: [place];  Service: Orthopedics;  Laterality: Left;  . AMPUTATION Left 02/24/2020   Procedure: LEFT SECOND TOE AMPUTATION;  Surgeon: Peter Sandy, MD;  Location: [place];  Service: Vascular;  Laterality: Left;  . BIV PACEMAKER INSERTION CRT-P N/A 02/11/2020   Procedure: BIV PACEMAKER INSERTION CRT-P;  Surgeon: Peter Grayer, MD;  Location: [place] [facility];  Service: Cardiovascular;  Laterality: N/A;  . CATARACT EXTRACTION     bi lateral  . COLONOSCOPY    .  HEMORRHOID SURGERY    . INGUINAL HERNIA REPAIR    . LOWER EXTREMITY ANGIOGRAPHY  02/23/2020   Procedure: Lower Extremity Angiography;  Surgeon: Peter Sandy, MD;  Location: [place] [facility];  Service: Cardiovascular;;  . PERIPHERAL VASCULAR ATHERECTOMY Left 02/23/2020   Procedure: PERIPHERAL VASCULAR ATHERECTOMY;  Surgeon: Peter Sandy, MD;  Location: [place] [facility];  Service: Cardiovascular;  Laterality: Left;  . PERIPHERAL VASCULAR BALLOON ANGIOPLASTY  02/23/2020   Procedure: PERIPHERAL VASCULAR BALLOON ANGIOPLASTY;  Surgeon: Peter Sandy, MD;  Location: [place] [facility];  Service: Cardiovascular;;  left Peroneal   . PERIPHERAL VASCULAR INTERVENTION Right 01/19/2020   Procedure: PERIPHERAL VASCULAR INTERVENTION;  Surgeon: Peter Sandy, MD;  Location: [place] [facility];  Service: Cardiovascular;  Laterality: Right;    ROS- all systems are reviewed and negative except as per HPI above  Current Outpatient Medications  Medication Sig Dispense Refill  . albuterol (VENTOLIN HFA) 108 (90 Base) MCG/ACT inhaler Inhale 2 puffs into the lungs every 6 (six) hours as needed for wheezing or shortness of breath.    . allopurinol (ZYLOPRIM) 300 MG tablet Take 300 mg by mouth daily.     . Alogliptin Benzoate 12.5 MG TABS Take 6.25 mg by mouth daily.    [name] amLODipine (NORVASC) 10 MG tablet Take 1 tablet (10 mg total) by mouth daily. 90 tablet 3  . aspirin EC 81 MG tablet Take 81 mg by mouth daily. Swallow whole.    [name]  atorvastatin (LIPITOR) 10 MG tablet Take 1 tablet (10 mg total) by mouth every evening. 30 tablet 6  . carvedilol (COREG) 3.125 MG tablet Take 3.125 mg by mouth 2 (two) times daily with a meal.    . cholecalciferol (VITAMIN D3) 25 MCG (1000 UNIT) tablet Take 1,000 Units by mouth daily.    . clopidogrel (PLAVIX) 75 MG tablet Take 1 tablet (75 mg total) by mouth daily. 30 tablet 3  . clotrimazole (LOTRIMIN) 1 % cream Apply 1 application  topically once a week.    . nortriptyline (PAMELOR) 10 MG capsule Take 10 mg by mouth at bedtime.     . polyethylene glycol (MIRALAX / GLYCOLAX) 17 g packet Take 17 g by mouth daily as needed for mild constipation.    . sildenafil (VIAGRA) 100 MG tablet Take 100 mg by mouth daily as needed for erectile dysfunction.    . sulfamethoxazole-trimethoprim (BACTRIM) 400-80 MG tablet Take 1 tablet by mouth 2 (two) times daily. 28 tablet 0  . tamsulosin (FLOMAX) 0.4 MG CAPS capsule Take 0.4 mg by mouth daily.     . traMADol (ULTRAM) 50 MG tablet Take 1 tablet (50 mg total) by mouth every 6 (six) hours as needed. 8 tablet 0   No current facility-administered medications for this visit.    Physical Exam: Vitals:   05/17/20 1436  BP: 114/68  Pulse: 80  SpO2: 99%  Weight: 194 lb (88 kg)  Height: 6\' 1"  (1.854 m)    GEN- The patient is elderly appearing, alert and oriented x 3 today.   Head- normocephalic, atraumatic Eyes-  Sclera clear, conjunctiva pink Ears- hearing intact Oropharynx- clear Lungs- Clear to ausculation bilaterally, normal work of breathing Chest- pacemaker pocket is well healed Heart- Regular rate and rhythm, no murmurs, rubs or gallops, PMI not laterally displaced GI- soft, NT, ND, + BS Extremities- no clubbing, cyanosis, or edema  Pacemaker interrogation- reviewed in detail today,  See PACEART report  ekg tracing ordered today is personally reviewed and shows sinus with V pacing  Assessment and Plan:  1. Complete heart block Normal pacemaker function See Pace Art report No changes today he is device dependant today  2. Nonischemic CM/ chronic systolic dysfunction Stable No change required today  3. afib New diagnosis Burden is 9% by PPM We discussed at length today.  He is on ASA and plavix as per Peter Donzetta Matters.  Hopefully on return, we can stop these and start eliquis.  We will need to discuss with Peter Donzetta Matters prior to making this change.  Risks, benefits and  potential toxicities for medications prescribed and/or refilled reviewed with patient today.   Return to see EP PA in 6 months  Peter Grayer MD, [facility] 05/17/2020 2:41 PM

## 2020-05-19 DIAGNOSIS — I110 Hypertensive heart disease with heart failure: Secondary | ICD-10-CM | POA: Diagnosis not present

## 2020-05-19 DIAGNOSIS — E11621 Type 2 diabetes mellitus with foot ulcer: Secondary | ICD-10-CM | POA: Diagnosis not present

## 2020-05-19 DIAGNOSIS — E1151 Type 2 diabetes mellitus with diabetic peripheral angiopathy without gangrene: Secondary | ICD-10-CM | POA: Diagnosis not present

## 2020-05-19 DIAGNOSIS — Z4781 Encounter for orthopedic aftercare following surgical amputation: Secondary | ICD-10-CM | POA: Diagnosis not present

## 2020-05-19 DIAGNOSIS — I2510 Atherosclerotic heart disease of native coronary artery without angina pectoris: Secondary | ICD-10-CM | POA: Diagnosis not present

## 2020-05-19 DIAGNOSIS — I5030 Unspecified diastolic (congestive) heart failure: Secondary | ICD-10-CM | POA: Diagnosis not present

## 2020-05-19 DIAGNOSIS — E785 Hyperlipidemia, unspecified: Secondary | ICD-10-CM | POA: Diagnosis not present

## 2020-05-19 DIAGNOSIS — E1140 Type 2 diabetes mellitus with diabetic neuropathy, unspecified: Secondary | ICD-10-CM | POA: Diagnosis not present

## 2020-05-19 DIAGNOSIS — L97512 Non-pressure chronic ulcer of other part of right foot with fat layer exposed: Secondary | ICD-10-CM | POA: Diagnosis not present

## 2020-05-19 NOTE — Progress Notes (Signed)
Peter James, Peter James ([id number]) Visit Report for 05/17/2020 Arrival Information Details Patient Name: Date of Service: Peter James 05/17/2020 10:00 A M Medical Record Number: [id number] Patient Account Number: [id number] Date of Birth/Sex: Treating RN: 1925-04-23 (87 y.o. [name] Primary Care Provider: [name] Other Clinician: Referring Provider: Treating Provider/Extender: Greta Doom Weeks in Treatment: 1 Visit Information History Since Last Visit Added or deleted any medications: No Patient Arrived: Wheel Chair Any new allergies or adverse reactions: No Arrival Time: 10:22 Had a fall or experienced change in No Accompanied By: son in law activities of daily living that may affect Transfer Assistance: None risk of falls: Patient Identification Verified: Yes Signs or symptoms of abuse/neglect since last visito No Secondary Verification Process Completed: Yes Hospitalized since last visit: No Patient Requires Transmission-Based No Implantable device outside of the clinic excluding No Precautions: cellular tissue based products placed in the center Patient Has Alerts: Yes since last visit: Patient Alerts: ABI's 12/21: R:0.75 L0.77 Has Dressing in Place as Prescribed: Yes Pain Present Now: No Electronic Signature(s) Signed: 05/18/2020 8:29:17 AM By: Sandre Kitty Entered By: Sandre Kitty on 05/17/2020 10:23:52 -------------------------------------------------------------------------------- Clinic Level of Care Assessment Details Patient Name: Date of Service: Peter James 05/17/2020 10:00 A M Medical Record Number: [id number] Patient Account Number: [id number] Date of Birth/Sex: Treating RN: [birth year] (87 y.o. [name] Primary Care Provider: [name] Other Clinician: Referring Provider: Treating Provider/Extender: Greta Doom Weeks in Treatment: 1 Clinic Level of Care Assessment Items TOOL 4 Quantity  Score X- 1 0 Use when only an EandM is performed on FOLLOW-UP visit ASSESSMENTS - Nursing Assessment / Reassessment X- 1 10 Reassessment of Co-morbidities (includes updates in patient status) X- 1 5 Reassessment of Adherence to Treatment Plan ASSESSMENTS - Wound and Skin A ssessment / Reassessment []  - 0 Simple Wound Assessment / Reassessment - one wound X- 2 5 Complex Wound Assessment / Reassessment - multiple wounds []  - 0 Dermatologic / Skin Assessment (not related to wound area) ASSESSMENTS - Focused Assessment []  - 0 Circumferential Edema Measurements - multi extremities []  - 0 Nutritional Assessment / Counseling / Intervention X- 1 5 Lower Extremity Assessment (monofilament, tuning fork, pulses) []  - 0 Peripheral Arterial Disease Assessment (using hand held doppler) ASSESSMENTS - Ostomy and/or Continence Assessment and Care []  - 0 Incontinence Assessment and Management []  - 0 Ostomy Care Assessment and Management (repouching, etc.) PROCESS - Coordination of Care X - Simple Patient / Family Education for ongoing care 1 15 []  - 0 Complex (extensive) Patient / Family Education for ongoing care X- 1 10 Staff obtains Programmer, systems, Records, T Results / Process Orders est X- 1 10 Staff telephones HHA, Nursing Homes / Clarify orders / etc []  - 0 Routine Transfer to another Facility (non-emergent condition) []  - 0 Routine Hospital Admission (non-emergent condition) []  - 0 New Admissions / Biomedical engineer / Ordering NPWT Apligraf, etc. , []  - 0 Emergency Hospital Admission (emergent condition) X- 1 10 Simple Discharge Coordination []  - 0 Complex (extensive) Discharge Coordination PROCESS - Special Needs []  - 0 Pediatric / Minor Patient Management []  - 0 Isolation Patient Management []  - 0 Hearing / Language / Visual special needs []  - 0 Assessment of Community assistance (transportation, D/C planning, etc.) []  - 0 Additional assistance / Altered  mentation []  - 0 Support Surface(s) Assessment (bed, cushion, seat, etc.) INTERVENTIONS - Wound Cleansing / Measurement []  - 0 Simple Wound Cleansing - one wound X-  2 5 Complex Wound Cleansing - multiple wounds X- 1 5 Wound Imaging (photographs - any number of wounds) []  - 0 Wound Tracing (instead of photographs) []  - 0 Simple Wound Measurement - one wound X- 2 5 Complex Wound Measurement - multiple wounds INTERVENTIONS - Wound Dressings X - Small Wound Dressing one or multiple wounds 2 10 []  - 0 Medium Wound Dressing one or multiple wounds []  - 0 Large Wound Dressing one or multiple wounds X- 1 5 Application of Medications - topical []  - 0 Application of Medications - injection INTERVENTIONS - Miscellaneous []  - 0 External ear exam []  - 0 Specimen Collection (cultures, biopsies, blood, body fluids, etc.) []  - 0 Specimen(s) / Culture(s) sent or taken to Lab for analysis []  - 0 Patient Transfer (multiple staff / Civil Service fast streamer / Similar devices) []  - 0 Simple Staple / Suture removal (25 or less) []  - 0 Complex Staple / Suture removal (26 or more) []  - 0 Hypo / Hyperglycemic Management (close monitor of Blood Glucose) []  - 0 Ankle / Brachial Index (ABI) - do not check if billed separately X- 1 5 Vital Signs Has the patient been seen at the hospital within the last three years: Yes Total Score: 130 Level Of Care: New/Established - Level 4 Electronic Signature(s) Signed: 05/17/2020 5:56:44 PM By: Levan Hurst RN, BSN Entered By: Levan Hurst on 05/17/2020 17:12:43 -------------------------------------------------------------------------------- Encounter Discharge Information Details Patient Name: Date of Service: Peter James 05/17/2020 10:00 A M Medical Record Number: [id number] Patient Account Number: [id number] Date of Birth/Sex: Treating RN: 01-Jun-1925 (87 y.o. [name] Primary Care Provider: [name] Other Clinician: Referring  Provider: Treating Provider/Extender: Greta Doom Weeks in Treatment: 1 Encounter Discharge Information Items Discharge Condition: Stable Ambulatory Status: Wheelchair Discharge Destination: Home Transportation: Private Auto Accompanied By: son Schedule Follow-up Appointment: Yes Clinical Summary of Care: Patient Declined Electronic Signature(s) Signed: 05/18/2020 5:57:00 PM By: Baruch Gouty RN, BSN Entered By: Baruch Gouty on 05/17/2020 11:15:38 -------------------------------------------------------------------------------- Lower Extremity Assessment Details Patient Name: Date of Service: Peter James 05/17/2020 10:00 A M Medical Record Number: [id number] Patient Account Number: [id number] Date of Birth/Sex: Treating RN: 03-Nov-1925 (87 y.o. [name] Primary Care Provider: [name] Other Clinician: Referring Provider: Treating Provider/Extender: Greta Doom Weeks in Treatment: 1 Edema Assessment Assessed: [Left: No] [Right: No] Edema: [Left: No] [Right: No] Calf Left: Right: Point of Measurement: 42 cm From Medial Instep 35.2 cm 35 cm Ankle Left: Right: Point of Measurement: 23 cm From Medial Instep 22.6 cm 22.8 cm Vascular Assessment Pulses: Dorsalis Pedis Palpable: [Left:No] [Right:No] Electronic Signature(s) Signed: 05/18/2020 5:57:00 PM By: Baruch Gouty RN, BSN Entered By: Baruch Gouty on 05/17/2020 10:43:58 -------------------------------------------------------------------------------- Multi Wound Chart Details Patient Name: Date of Service: Peter James 05/17/2020 10:00 A M Medical Record Number: [id number] Patient Account Number: [id number] Date of Birth/Sex: Treating RN: [birth year] (87 y.o. [name] Primary Care Provider: [name] Other Clinician: Referring Provider: Treating Provider/Extender: Greta Doom Weeks in Treatment: 1 Vital Signs Height(in):  67 Pulse(bpm): 50 Weight(lbs): 217 Blood Pressure(mmHg): 105/64 Body Mass Index(BMI): 29 Temperature(F): 97.8 Respiratory Rate(breaths/min): 17 Photos: [2:No Photos] Right, Medial Foot Right Knee Left T Third oe Wound Location: Blister Skin Tear/Laceration Gradually Appeared Wounding Event: Diabetic Wound/Ulcer of the Lower Skin Tear Diabetic Wound/Ulcer of the Lower Primary Etiology: Extremity Extremity Coronary Artery Disease, Coronary Artery Disease, Coronary Artery Disease, Comorbid History: Hypertension, Peripheral Arterial Hypertension, Peripheral Arterial  Hypertension, Peripheral Arterial Disease, Type II Diabetes, Disease, Type II Diabetes, Disease, Type II Diabetes, Neuropathy Neuropathy Neuropathy 02/18/2020 04/30/2020 05/17/2020 Date Acquired: 1 1 0 Weeks of Treatment: Open Healed - Epithelialized Open Wound Status: 0.7x0.5x0.6 0x0x0 0.3x0.4x0.1 Measurements L x W x D (cm) 0.275 0 0.094 A (cm) : rea 0.165 0 0.009 Volume (cm) : -192.60% 100.00% 0.00% % Reduction in A rea: -120.00% 100.00% 0.00% % Reduction in Volume: 6 Starting Position 1 (o'clock): 12 Ending Position 1 (o'clock): 0.3 Maximum Distance 1 (cm): Yes No No Undermining: Grade 2 Full Thickness Without Exposed Grade 1 Classification: Support Structures Medium None Present Small Exudate Amount: Purulent James/A Serosanguineous Exudate Type: yellow, brown, green James/A red, brown Exudate Color: Well defined, not attached Indistinct, nonvisible Flat and Intact Wound Margin: Large (67-100%) None Present (0%) Medium (34-66%) Granulation Amount: Red, Pink James/A Pink Granulation Quality: Small (1-33%) None Present (0%) None Present (0%) Necrotic Amount: Fat Layer (Subcutaneous Tissue): Yes Fascia: No Fat Layer (Subcutaneous Tissue): Yes Exposed Structures: Bone: Yes Fat Layer (Subcutaneous Tissue): No Fascia: No Fascia: No Tendon: No Tendon: No Tendon: No Muscle: No Muscle: No Muscle:  No Joint: No Joint: No Joint: No Bone: No Bone: No None Large (67-100%) Medium (34-66%) Epithelialization: Treatment Notes Wound #1 (Foot) Wound Laterality: Right, Medial Cleanser Wound Cleanser Discharge Instruction: Cleanse the wound with wound cleanser or normal saline prior to applying a clean dressing using gauze sponges, not tissue or cotton balls. Peri-Wound Care Topical Primary Dressing KerraCel Ag Gelling Fiber Dressing, 2x2 in (silver alginate) Discharge Instruction: lightly pack into tunneling/undermining Secondary Dressing Woven Gauze Sponges 2x2 in Discharge Instruction: Apply over primary dressing as directed. Optifoam Non-Adhesive Dressing, 4x4 in Discharge Instruction: Foam donut Secured With [employer] Sterile, 4.5x3.1 (in/yd) Discharge Instruction: Secure with Kerlix as directed. Paper Tape, 2x10 (in/yd) Discharge Instruction: Secure dressing with tape as directed. Compression Wrap Compression Stockings Add-Ons Wound #3 (Toe Third) Wound Laterality: Left Cleanser Wound Cleanser Discharge Instruction: Cleanse the wound with wound cleanser or normal saline prior to applying a clean dressing using gauze sponges, not tissue or cotton balls. Peri-Wound Care Topical Primary Dressing KerraCel Ag Gelling Fiber Dressing, 2x2 in (silver alginate) Discharge Instruction: Apply silver alginate to wound bed as instructed Secondary Dressing Woven Gauze Sponges 2x2 in Discharge Instruction: Apply over primary dressing as directed. Secured With [employer], 4.5x3.1 (in/yd) Discharge Instruction: Secure with Kerlix as directed. Paper Tape, 2x10 (in/yd) Discharge Instruction: Secure dressing with tape as directed. Compression Wrap Compression Stockings Add-Ons Electronic Signature(s) Signed: 05/17/2020 6:21:54 PM By: Linton Ham MD Signed: 05/19/2020 2:59:34 PM By: Levan Hurst RN, BSN Entered By: Linton Ham on 05/17/2020  18:07:48 -------------------------------------------------------------------------------- Multi-Disciplinary Care Plan Details Patient Name: Date of Service: Peter James 05/17/2020 10:00 A M Medical Record Number: [id number] Patient Account Number: [id number] Date of Birth/Sex: Treating RN: 1926/02/27 (87 y.o. [name] Primary Care Provider: [name] Other Clinician: Referring Provider: Treating Provider/Extender: Greta Doom Weeks in Treatment: 1 Multidisciplinary Care Plan reviewed with physician Active Inactive Nutrition Nursing Diagnoses: Impaired glucose control: actual or potential Potential for alteratiion in Nutrition/Potential for imbalanced nutrition Goals: Patient/caregiver agrees to and verbalizes understanding of need to use nutritional supplements and/or vitamins as prescribed Date Initiated: 05/10/2020 Target Resolution Date: 06/11/2020 Goal Status: Active Patient/caregiver will maintain therapeutic glucose control Date Initiated: 05/10/2020 Target Resolution Date: 06/11/2020 Goal Status: Active Interventions: Assess HgA1c results as ordered upon admission and as needed Assess patient nutrition upon admission and as  needed per policy Provide education on elevated blood sugars and impact on wound healing Provide education on nutrition Treatment Activities: Education provided on Nutrition : 05/10/2020 Notes: Wound/Skin Impairment Nursing Diagnoses: Impaired tissue integrity Knowledge deficit related to ulceration/compromised skin integrity Goals: Patient/caregiver will verbalize understanding of skin care regimen Date Initiated: 05/10/2020 Target Resolution Date: 06/11/2020 Goal Status: Active Ulcer/skin breakdown will have a volume reduction of 30% by week 4 Date Initiated: 05/10/2020 Target Resolution Date: 06/11/2020 Goal Status: Active Interventions: Assess patient/caregiver ability to obtain necessary supplies Assess  patient/caregiver ability to perform ulcer/skin care regimen upon admission and as needed Assess ulceration(s) every visit Provide education on ulcer and skin care Notes: Electronic Signature(s) Signed: 05/17/2020 5:56:44 PM By: Levan Hurst RN, BSN Entered By: Levan Hurst on 05/17/2020 17:11:58 -------------------------------------------------------------------------------- Pain Assessment Details Patient Name: Date of Service: Peter James 05/17/2020 10:00 A M Medical Record Number: [id number] Patient Account Number: [id number] Date of Birth/Sex: Treating RN: 05/10/1925 (87 y.o. [name] Primary Care Provider: [name] Other Clinician: Referring Provider: Treating Provider/Extender: Greta Doom Weeks in Treatment: 1 Active Problems Location of Pain Severity and Description of Pain Patient Has Paino No Site Locations Rate the pain. Current Pain Level: 0 Character of Pain Describe the Pain: Tender Pain Management and Medication Current Pain Management: Electronic Signature(s) Signed: 05/17/2020 5:56:44 PM By: Levan Hurst RN, BSN Signed: 05/18/2020 5:57:00 PM By: Baruch Gouty RN, BSN Entered By: Baruch Gouty on 05/17/2020 10:42:05 -------------------------------------------------------------------------------- Patient/Caregiver Education Details Patient Name: Date of Service: Peter James 2/28/2022andnbsp10:00 A M Medical Record Number: [id number] Patient Account Number: [id number] Date of Birth/Gender: Treating RN: 1926-02-01 (87 y.o. [name] Primary Care Physician: [name] Other Clinician: Referring Physician: Treating Physician/Extender: Samul Dada in Treatment: 1 Education Assessment Education Provided To: Patient Education Topics Provided Nutrition: Methods: Explain/Verbal Responses: State content correctly Wound/Skin Impairment: Methods: Explain/Verbal Responses: State  content correctly Electronic Signature(s) Signed: 05/17/2020 5:56:44 PM By: Levan Hurst RN, BSN Entered By: Levan Hurst on 05/17/2020 17:12:11 -------------------------------------------------------------------------------- Wound Assessment Details Patient Name: Date of Service: Peter James 05/17/2020 10:00 A M Medical Record Number: [id number] Patient Account Number: [id number] Date of Birth/Sex: Treating RN: 15-Dec-1925 (87 y.o. [name] Primary Care Provider: [name] Other Clinician: Referring Provider: Treating Provider/Extender: Greta Doom Weeks in Treatment: 1 Wound Status Wound Number: 1 Primary Diabetic Wound/Ulcer of the Lower Extremity Etiology: Wound Location: Right, Medial Foot Wound Open Wounding Event: Blister Status: Date Acquired: 02/18/2020 Comorbid Coronary Artery Disease, Hypertension, Peripheral Arterial Weeks Of Treatment: 1 History: Disease, Type II Diabetes, Neuropathy Clustered Wound: No Photos Wound Measurements Length: (cm) 0.7 Width: (cm) 0.5 Depth: (cm) 0.6 Area: (cm) 0.275 Volume: (cm) 0.165 % Reduction in Area: -192.6% % Reduction in Volume: -120% Epithelialization: None Tunneling: No Undermining: Yes Starting Position (o'clock): 6 Ending Position (o'clock): 12 Maximum Distance: (cm) 0.3 Wound Description Classification: Grade 2 Wound Margin: Well defined, not attached Exudate Amount: Medium Exudate Type: Purulent Exudate Color: yellow, brown, green Foul Odor After Cleansing: No Slough/Fibrino Yes Wound Bed Granulation Amount: Large (67-100%) Exposed Structure Granulation Quality: Red, Pink Fascia Exposed: No Necrotic Amount: Small (1-33%) Fat Layer (Subcutaneous Tissue) Exposed: Yes Necrotic Quality: Adherent Slough Tendon Exposed: No Muscle Exposed: No Joint Exposed: No Bone Exposed: Yes Treatment Notes Wound #1 (Foot) Wound Laterality: Right, Medial Cleanser Wound  Cleanser Discharge Instruction: Cleanse the wound with wound cleanser or normal saline prior to applying a clean  dressing using gauze sponges, not tissue or cotton balls. Peri-Wound Care Topical Primary Dressing KerraCel Ag Gelling Fiber Dressing, 2x2 in (silver alginate) Discharge Instruction: lightly pack into tunneling/undermining Secondary Dressing Woven Gauze Sponges 2x2 in Discharge Instruction: Apply over primary dressing as directed. Optifoam Non-Adhesive Dressing, 4x4 in Discharge Instruction: Foam donut Secured With [employer] Sterile, 4.5x3.1 (in/yd) Discharge Instruction: Secure with Kerlix as directed. Paper Tape, 2x10 (in/yd) Discharge Instruction: Secure dressing with tape as directed. Compression Wrap Compression Stockings Add-Ons Electronic Signature(s) Signed: 05/17/2020 5:56:44 PM By: Levan Hurst RN, BSN Signed: 05/18/2020 8:29:17 AM By: Sandre Kitty Entered By: Sandre Kitty on 05/17/2020 15:38:53 -------------------------------------------------------------------------------- Wound Assessment Details Patient Name: Date of Service: Peter James 05/17/2020 10:00 A M Medical Record Number: [id number] Patient Account Number: [id number] Date of Birth/Sex: Treating RN: [birth year] (87 y.o. [name] Primary Care Provider: [name] Other Clinician: Referring Provider: Treating Provider/Extender: Greta Doom Weeks in Treatment: 1 Wound Status Wound Number: 2 Primary Skin Tear Etiology: Wound Location: Right Knee Wound Healed - Epithelialized Wounding Event: Skin Tear/Laceration Status: Date Acquired: 04/30/2020 Comorbid Coronary Artery Disease, Hypertension, Peripheral Arterial Weeks Of Treatment: 1 History: Disease, Type II Diabetes, Neuropathy Clustered Wound: No Wound Measurements Length: (cm) Width: (cm) Depth: (cm) Area: (cm) Volume: (cm) 0 % Reduction in Area: 100% 0 % Reduction in Volume: 100% 0  Epithelialization: Large (67-100%) 0 Tunneling: No 0 Undermining: No Wound Description Classification: Full Thickness Without Exposed Support Structures Wound Margin: Indistinct, nonvisible Exudate Amount: None Present Foul Odor After Cleansing: No Slough/Fibrino No Wound Bed Granulation Amount: None Present (0%) Exposed Structure Necrotic Amount: None Present (0%) Fascia Exposed: No Fat Layer (Subcutaneous Tissue) Exposed: No Tendon Exposed: No Muscle Exposed: No Joint Exposed: No Bone Exposed: No Electronic Signature(s) Signed: 05/17/2020 5:56:44 PM By: Levan Hurst RN, BSN Signed: 05/18/2020 5:57:00 PM By: Baruch Gouty RN, BSN Entered By: Baruch Gouty on 05/17/2020 10:45:23 -------------------------------------------------------------------------------- Wound Assessment Details Patient Name: Date of Service: Peter James 05/17/2020 10:00 A M Medical Record Number: [id number] Patient Account Number: [id number] Date of Birth/Sex: Treating RN: 09-20-1925 (87 y.o. [name] Primary Care Provider: [name] Other Clinician: Referring Provider: Treating Provider/Extender: Greta Doom Weeks in Treatment: 1 Wound Status Wound Number: 3 Primary Diabetic Wound/Ulcer of the Lower Extremity Etiology: Wound Location: Left T Third oe Wound Open Wounding Event: Gradually Appeared Status: Date Acquired: 05/17/2020 Comorbid Coronary Artery Disease, Hypertension, Peripheral Arterial Weeks Of Treatment: 0 History: Disease, Type II Diabetes, Neuropathy Clustered Wound: No Photos Wound Measurements Length: (cm) 0.3 Width: (cm) 0.4 Depth: (cm) 0.1 Area: (cm) 0.094 Volume: (cm) 0.009 % Reduction in Area: 0% % Reduction in Volume: 0% Epithelialization: Medium (34-66%) Tunneling: No Undermining: No Wound Description Classification: Grade 1 Wound Margin: Flat and Intact Exudate Amount: Small Exudate Type: Serosanguineous Exudate Color:  red, brown Foul Odor After Cleansing: No Slough/Fibrino No Wound Bed Granulation Amount: Medium (34-66%) Exposed Structure Granulation Quality: Pink Fascia Exposed: No Necrotic Amount: None Present (0%) Fat Layer (Subcutaneous Tissue) Exposed: Yes Tendon Exposed: No Muscle Exposed: No Joint Exposed: No Bone Exposed: No Treatment Notes Wound #3 (Toe Third) Wound Laterality: Left Cleanser Wound Cleanser Discharge Instruction: Cleanse the wound with wound cleanser or normal saline prior to applying a clean dressing using gauze sponges, not tissue or cotton balls. Peri-Wound Care Topical Primary Dressing KerraCel Ag Gelling Fiber Dressing, 2x2 in (silver alginate) Discharge Instruction: Apply silver alginate to wound bed as instructed Secondary Dressing Woven  Gauze Sponges 2x2 in Discharge Instruction: Apply over primary dressing as directed. Secured With [employer], 4.5x3.1 (in/yd) Discharge Instruction: Secure with Kerlix as directed. Paper Tape, 2x10 (in/yd) Discharge Instruction: Secure dressing with tape as directed. Compression Wrap Compression Stockings Add-Ons Electronic Signature(s) Signed: 05/17/2020 5:56:44 PM By: Levan Hurst RN, BSN Signed: 05/18/2020 8:29:17 AM By: Sandre Kitty Entered By: Sandre Kitty on 05/17/2020 15:39:14 -------------------------------------------------------------------------------- [place] Details Patient Name: Date of Service: Peter James 05/17/2020 10:00 A M Medical Record Number: [id number] Patient Account Number: [id number] Date of Birth/Sex: Treating RN: [birth year] (87 y.o. [name] Primary Care Provider: [name] Other Clinician: Referring Provider: Treating Provider/Extender: Greta Doom Weeks in Treatment: 1 Vital Signs Time Taken: 10:23 Temperature (F): 97.8 Height (in): 73 Pulse (bpm): 80 Weight (lbs): 217 Respiratory Rate (breaths/min): 17 Body Mass Index (BMI):  28.6 Blood Pressure (mmHg): 105/64 Reference Range: 80 - 120 mg / dl Electronic Signature(s) Signed: 05/18/2020 8:29:17 AM By: Sandre Kitty Entered By: Sandre Kitty on 05/17/2020 10:24:15

## 2020-05-19 NOTE — Progress Notes (Signed)
Remote pacemaker transmission.   

## 2020-05-21 DIAGNOSIS — E1151 Type 2 diabetes mellitus with diabetic peripheral angiopathy without gangrene: Secondary | ICD-10-CM | POA: Diagnosis not present

## 2020-05-21 DIAGNOSIS — E11621 Type 2 diabetes mellitus with foot ulcer: Secondary | ICD-10-CM | POA: Diagnosis not present

## 2020-05-21 DIAGNOSIS — Z4781 Encounter for orthopedic aftercare following surgical amputation: Secondary | ICD-10-CM | POA: Diagnosis not present

## 2020-05-21 DIAGNOSIS — I2510 Atherosclerotic heart disease of native coronary artery without angina pectoris: Secondary | ICD-10-CM | POA: Diagnosis not present

## 2020-05-21 DIAGNOSIS — L97512 Non-pressure chronic ulcer of other part of right foot with fat layer exposed: Secondary | ICD-10-CM | POA: Diagnosis not present

## 2020-05-21 DIAGNOSIS — I110 Hypertensive heart disease with heart failure: Secondary | ICD-10-CM | POA: Diagnosis not present

## 2020-05-21 DIAGNOSIS — E785 Hyperlipidemia, unspecified: Secondary | ICD-10-CM | POA: Diagnosis not present

## 2020-05-21 DIAGNOSIS — E1140 Type 2 diabetes mellitus with diabetic neuropathy, unspecified: Secondary | ICD-10-CM | POA: Diagnosis not present

## 2020-05-21 DIAGNOSIS — I5030 Unspecified diastolic (congestive) heart failure: Secondary | ICD-10-CM | POA: Diagnosis not present

## 2020-05-24 ENCOUNTER — Encounter (HOSPITAL_BASED_OUTPATIENT_CLINIC_OR_DEPARTMENT_OTHER): Payer: Medicare HMO | Admitting: Internal Medicine

## 2020-05-24 DIAGNOSIS — E785 Hyperlipidemia, unspecified: Secondary | ICD-10-CM | POA: Diagnosis not present

## 2020-05-24 DIAGNOSIS — I2510 Atherosclerotic heart disease of native coronary artery without angina pectoris: Secondary | ICD-10-CM | POA: Diagnosis not present

## 2020-05-24 DIAGNOSIS — E1151 Type 2 diabetes mellitus with diabetic peripheral angiopathy without gangrene: Secondary | ICD-10-CM | POA: Diagnosis not present

## 2020-05-24 DIAGNOSIS — L97512 Non-pressure chronic ulcer of other part of right foot with fat layer exposed: Secondary | ICD-10-CM | POA: Diagnosis not present

## 2020-05-24 DIAGNOSIS — I5030 Unspecified diastolic (congestive) heart failure: Secondary | ICD-10-CM | POA: Diagnosis not present

## 2020-05-24 DIAGNOSIS — Z4781 Encounter for orthopedic aftercare following surgical amputation: Secondary | ICD-10-CM | POA: Diagnosis not present

## 2020-05-24 DIAGNOSIS — E11621 Type 2 diabetes mellitus with foot ulcer: Secondary | ICD-10-CM | POA: Diagnosis not present

## 2020-05-24 DIAGNOSIS — E1140 Type 2 diabetes mellitus with diabetic neuropathy, unspecified: Secondary | ICD-10-CM | POA: Diagnosis not present

## 2020-05-24 DIAGNOSIS — I110 Hypertensive heart disease with heart failure: Secondary | ICD-10-CM | POA: Diagnosis not present

## 2020-05-25 ENCOUNTER — Other Ambulatory Visit: Payer: Self-pay

## 2020-05-25 ENCOUNTER — Encounter (HOSPITAL_BASED_OUTPATIENT_CLINIC_OR_DEPARTMENT_OTHER): Payer: Medicare HMO | Attending: Internal Medicine | Admitting: Physician Assistant

## 2020-05-25 DIAGNOSIS — L97512 Non-pressure chronic ulcer of other part of right foot with fat layer exposed: Secondary | ICD-10-CM | POA: Diagnosis not present

## 2020-05-25 DIAGNOSIS — Z89412 Acquired absence of left great toe: Secondary | ICD-10-CM | POA: Diagnosis not present

## 2020-05-25 DIAGNOSIS — J449 Chronic obstructive pulmonary disease, unspecified: Secondary | ICD-10-CM | POA: Insufficient documentation

## 2020-05-25 DIAGNOSIS — L97518 Non-pressure chronic ulcer of other part of right foot with other specified severity: Secondary | ICD-10-CM | POA: Insufficient documentation

## 2020-05-25 DIAGNOSIS — E1151 Type 2 diabetes mellitus with diabetic peripheral angiopathy without gangrene: Secondary | ICD-10-CM | POA: Diagnosis not present

## 2020-05-25 DIAGNOSIS — I10 Essential (primary) hypertension: Secondary | ICD-10-CM | POA: Diagnosis not present

## 2020-05-25 DIAGNOSIS — X58XXXA Exposure to other specified factors, initial encounter: Secondary | ICD-10-CM | POA: Diagnosis not present

## 2020-05-25 DIAGNOSIS — S80211A Abrasion, right knee, initial encounter: Secondary | ICD-10-CM | POA: Insufficient documentation

## 2020-05-25 DIAGNOSIS — L97514 Non-pressure chronic ulcer of other part of right foot with necrosis of bone: Secondary | ICD-10-CM | POA: Diagnosis not present

## 2020-05-25 DIAGNOSIS — E11621 Type 2 diabetes mellitus with foot ulcer: Secondary | ICD-10-CM | POA: Diagnosis not present

## 2020-05-25 DIAGNOSIS — Z950 Presence of cardiac pacemaker: Secondary | ICD-10-CM | POA: Diagnosis not present

## 2020-05-26 ENCOUNTER — Encounter (HOSPITAL_BASED_OUTPATIENT_CLINIC_OR_DEPARTMENT_OTHER): Payer: Medicare HMO | Admitting: Physician Assistant

## 2020-05-26 NOTE — Progress Notes (Signed)
SNEIJDER, BERNARDS ([id number]) Visit Report for 05/25/2020 Chief Complaint Document Details Patient Name: Date of Service: Gretta Arab 05/25/2020 12:45 PM Medical Record Number: [id number] Patient Account Number: [id number] Date of Birth/Sex: Treating RN: 18-Jun-1925 (87 y.o. [name] Primary Care Provider: [name] Other Clinician: Referring Provider: Treating Provider/Extender: Consuelo Pandy, [name] Weeks in Treatment: 2 Information Obtained from: Patient Chief Complaint 05/10/2020; patient is here for review of a wound on the right medial first met head Electronic Signature(s) Signed: 05/26/2020 8:30:11 AM By: Worthy Keeler PA-C Entered By: Worthy Keeler on 05/26/2020 08:30:11 -------------------------------------------------------------------------------- Debridement Details Patient Name: Date of Service: [name] 05/25/2020 12:45 PM Medical Record Number: [id number] Patient Account Number: [id number] Date of Birth/Sex: Treating RN: 1925-11-27 (87 y.o. [name], [name] Primary Care Provider: [name] Other Clinician: Referring Provider: Treating Provider/Extender: Consuelo Pandy, [name] Weeks in Treatment: 2 Debridement Performed for Assessment: Wound #1 Right,Medial Foot Performed By: Physician Worthy Keeler, PA Debridement Type: Debridement Severity of Tissue Pre Debridement: Fat layer exposed Level of Consciousness (Pre-procedure): Awake and Alert Pre-procedure Verification/Time Out Yes - 13:00 Taken: Start Time: 13:00 Pain Control: Lidocaine T Area Debrided (L x W): otal 0.8 (cm) x 0.4 (cm) = 0.32 (cm) Tissue and other material debrided: Viable, Non-Viable, Callus, Slough, Subcutaneous, Skin: Dermis , Slough Level: Skin/Subcutaneous Tissue Debridement Description: Excisional Instrument: Curette Bleeding: Minimum Hemostasis Achieved: Pressure End Time: 13:01 Procedural Pain: 0 Post Procedural Pain: 0 Response to  Treatment: Procedure was tolerated well Level of Consciousness (Post- Awake and Alert procedure): Post Debridement Measurements of Total Wound Length: (cm) 0.8 Width: (cm) 0.4 Depth: (cm) 0.6 Volume: (cm) 0.151 Character of Wound/Ulcer Post Debridement: Improved Severity of Tissue Post Debridement: Fat layer exposed Post Procedure Diagnosis Same as Pre-procedure Electronic Signature(s) Signed: 05/26/2020 8:28:17 AM By: Worthy Keeler PA-C Signed: 05/26/2020 5:36:09 PM By: Rhae Hammock RN Entered By: Rhae Hammock on 05/25/2020 17:40:35 -------------------------------------------------------------------------------- HPI Details Patient Name: Date of Service: [name] 05/25/2020 12:45 PM Medical Record Number: [id number] Patient Account Number: [id number] Date of Birth/Sex: Treating RN: 1925-11-10 (87 y.o. [name] Primary Care Provider: [name] Other Clinician: Referring Provider: Treating Provider/Extender: Consuelo Pandy, [name] Weeks in Treatment: 2 History of Present Illness HPI Description: ADMISSION 05/10/2020 This is a independent 87 year old man who has had an area on his right medial first metatarsal head according to him for roughly 3 months. This started as a skin tear that scab he removed the scab and inside an area on this since. He is a type II diabetic also had an area on his left second toe and right first metatarsal head in November so this wound may have been present longer than not. He has undergone extensive arterial review including a stent in the right popliteal laser atherectomy of the right popliteal and anterior tibial arteries on 01/19/2020. On 02/23/2020 he had a laser atherectomy of the left peroneal angioplasty of the left peroneal and amputation of the left second toe. Follow-up noninvasive studies on 03/05/2020 showed an ABI on the right of 0.75 with a TBI of 0.39. On the left his ABI was 0.77, his left great toe was  amputated. He is not experiencing any pain. ALSO he had a right knee skin tear within the last week or 2. He is not really dressing either 1 of these areas Past medical; pacemaker, type 2 diabetes with PAD, osteomyelitis of the left first toe  amputated on 09/29/2019, hypertension hypercholesterolemia, COPD 2/28; this is a 87 year old man who arrives I believe with his son. He is a type II diabetic. He has a wound on the medial part of the first metatarsal head. Culture I did last time showed acid Enterobacter as well as rare Enterococcus. Will be giving him Augmentin to cover both of these for 10 days. His x-ray did not show osteomyelitis although they did comment on possible cortical erosion along the medial aspect of the head of the first metatarsal. He will definitely need an MRI. He comes into the clinic today with a new wound on the left third toe dorsal aspect over the PIP this also has exposed bone 05/25/2020 upon evaluation today patient appears to be doing well with regard to his wounds currently. Both are fairly clean. He is having a CT scan of his right foot on Friday. With that being said the toe on the left foot appears to be doing okay this is not too deep. There does not appear to be any signs of active infection at this time. No fevers, chills, nausea, vomiting, or diarrhea. Electronic Signature(s) Signed: 05/26/2020 8:32:31 AM By: Worthy Keeler PA-C Entered By: Worthy Keeler on 05/26/2020 08:32:31 -------------------------------------------------------------------------------- Physical Exam Details Patient Name: Date of Service: [name] 05/25/2020 12:45 PM Medical Record Number: [id number] Patient Account Number: [id number] Date of Birth/Sex: Treating RN: 1926/01/30 (87 y.o. [name] Primary Care Provider: [name] Other Clinician: Referring Provider: Treating Provider/Extender: Consuelo Pandy, [name] Weeks in Treatment:  2 Constitutional Well-nourished and well-hydrated in no acute distress. Cardiovascular regular rate and rhythm with normal S1, S2. Psychiatric this patient is able to make decisions and demonstrates good insight into disease process. Alert and Oriented x 3. pleasant and cooperative. Notes Patient's wounds currently did require some sharp debridement in regard to the right foot. I cleared away some of the callus as well as slough and subcutaneous tissue down to healthy subcutaneous tissue. He tolerated that without complication. This again is the deeper of the 2 wounds. There was no significant sharp debridement necessary on the toe on the left foot. Electronic Signature(s) Signed: 05/26/2020 8:32:52 AM By: Worthy Keeler PA-C Entered By: Worthy Keeler on 05/26/2020 08:32:52 -------------------------------------------------------------------------------- Physician Orders Details Patient Name: Date of Service: [name] 05/25/2020 12:45 PM Medical Record Number: [id number] Patient Account Number: [id number] Date of Birth/Sex: Treating RN: [birth year] (87 y.o. [name] Primary Care Provider: [name] Other Clinician: Referring Provider: Treating Provider/Extender: Consuelo Pandy, [name] Weeks in Treatment: 2 Verbal / Phone Orders: No Diagnosis Coding Follow-up Appointments Return Appointment in 1 week. Bathing/ Shower/ Hygiene May shower with protection but do not get wound dressing(s) wet. Off-Loading Open toe surgical shoe to: - right foot Home Health No change in wound care orders this week; continue Home Health for wound care. May utilize formulary equivalent dressing for wound treatment orders unless otherwise specified. Dressing changes to be completed by [facility] on Monday / Wednesday / Friday except when patient has scheduled visit at [facility] will change on Monday, home health to change on Wednesday and Friday. Pt has  CT scan on Friday which he will need to remove dressing for....if at all possible can home health go after pt.'s CT scan on Fridayo Other [facility] Orders/Instructions: - [facility] Wound Treatment Wound #1 - Foot Wound Laterality: Right, Medial Cleanser: Wound Cleanser (Home Health) 3  x Per Week/7 Days Discharge Instructions: Cleanse the wound with wound cleanser or normal saline prior to applying a clean dressing using gauze sponges, not tissue or cotton balls. Prim Dressing: KerraCel Ag Gelling Fiber Dressing, 2x2 in (silver alginate) (Home Health) 3 x Per Week/7 Days ary Discharge Instructions: lightly pack into tunneling/undermining Secondary Dressing: Woven Gauze Sponges 2x2 in (Home Health) 3 x Per Week/7 Days Discharge Instructions: Apply over primary dressing as directed. Secondary Dressing: Optifoam Non-Adhesive Dressing, 4x4 in (Home Health) 3 x Per Week/7 Days Discharge Instructions: Foam donut Secured With: Kerlix Roll Sterile, 4.5x3.1 (in/yd) ([place]) 3 x Per Week/7 Days Discharge Instructions: Secure with Kerlix as directed. Secured With: Paper Tape, 2x10 (in/yd) (Home Health) 3 x Per Week/7 Days Discharge Instructions: Secure dressing with tape as directed. Wound #3 - T Third oe Wound Laterality: Left Cleanser: Wound Cleanser (Home Health) 3 x Per Week/7 Days Discharge Instructions: Cleanse the wound with wound cleanser or normal saline prior to applying a clean dressing using gauze sponges, not tissue or cotton balls. Prim Dressing: KerraCel Ag Gelling Fiber Dressing, 2x2 in (silver alginate) (Home Health) 3 x Per Week/7 Days ary Discharge Instructions: Apply silver alginate to wound bed as instructed Secondary Dressing: Woven Gauze Sponges 2x2 in ([place]) 3 x Per Week/7 Days Discharge Instructions: Apply over primary dressing as directed. Secured With: [employer], 4.5x3.1 (in/yd) (Home Health) 3 x Per Week/7 Days Discharge Instructions: Secure with  Kerlix as directed. Secured With: Paper Tape, 2x10 (in/yd) (Home Health) 3 x Per Week/7 Days Discharge Instructions: Secure dressing with tape as directed. Radiology X-ray, foot - left foot; left 3rd toe ulcer Electronic Signature(s) Signed: 05/26/2020 8:28:17 AM By: Worthy Keeler PA-C Signed: 05/26/2020 5:36:09 PM By: Rhae Hammock RN Entered By: Rhae Hammock on 05/25/2020 17:42:17 Prescription 05/25/2020 -------------------------------------------------------------------------------- Lucrezia Starch PA Patient Name: Provider: April 15, 1925 [id number] Date of Birth: NPI#: Jerilynn Mages [id number] Sex: DEA #: [phone] Phone #: License #: [place] Patient Address: [address] [place], [place] [address] [place], [place] [address] [phone] Allergies No Known Allergies Provider's Orders X-ray, foot - left foot; left 3rd toe ulcer Hand Signature: Date(s): Electronic Signature(s) Signed: 05/26/2020 8:28:17 AM By: Worthy Keeler PA-C Signed: 05/26/2020 5:36:09 PM By: Rhae Hammock RN Entered By: Rhae Hammock on 05/25/2020 17:42:17 -------------------------------------------------------------------------------- Problem List Details Patient Name: Date of Service: [name] 05/25/2020 12:45 PM Medical Record Number: [id number] Patient Account Number: [id number] Date of Birth/Sex: Treating RN: [birth year] (87 y.o. [name], [name] Primary Care Provider: [name] Other Clinician: Referring Provider: Treating Provider/Extender: Consuelo Pandy, [name] Weeks in Treatment: 2 Active Problems ICD-10 Encounter Code Description Active Date MDM Diagnosis E11.621 Type 2 diabetes mellitus with foot ulcer 05/10/2020 No Yes E11.51 Type 2 diabetes mellitus with diabetic peripheral angiopathy without gangrene 05/10/2020 No Yes L97.514 Non-pressure chronic ulcer of other part of right foot  with necrosis of bone 05/10/2020 No Yes L97.518 Non-pressure chronic ulcer of other part of right foot with other specified 05/17/2020 No Yes severity S80.211D Abrasion, right knee, subsequent encounter 05/10/2020 No Yes Inactive Problems Resolved Problems Electronic Signature(s) Signed: 05/26/2020 8:30:04 AM By: Worthy Keeler PA-C Previous Signature: 05/25/2020 1:33:35 PM Version By: Worthy Keeler PA-C Entered By: Worthy Keeler on 05/26/2020 08:30:03 -------------------------------------------------------------------------------- Progress Note Details Patient Name: Date of Service: [name] 05/25/2020 12:45 PM Medical Record Number: [id number] Patient  Account Number: [id number] Date of Birth/Sex: Treating RN: 1925/04/14 (87 y.o. [name] Primary Care Provider: [name] Other Clinician: Referring Provider: Treating Provider/Extender: Consuelo Pandy, [name] Weeks in Treatment: 2 Subjective Chief Complaint Information obtained from Patient 05/10/2020; patient is here for review of a wound on the right medial first met head History of Present Illness (HPI) ADMISSION 05/10/2020 This is a independent 87 year old man who has had an area on his right medial first metatarsal head according to him for roughly 3 months. This started as a skin tear that scab he removed the scab and inside an area on this since. He is a type II diabetic also had an area on his left second toe and right first metatarsal head in November so this wound may have been present longer than not. He has undergone extensive arterial review including a stent in the right popliteal laser atherectomy of the right popliteal and anterior tibial arteries on 01/19/2020. On 02/23/2020 he had a laser atherectomy of the left peroneal angioplasty of the left peroneal and amputation of the left second toe. Follow-up noninvasive studies on 03/05/2020 showed an ABI on the right of 0.75 with a TBI of 0.39. On the  left his ABI was 0.77, his left great toe was amputated. He is not experiencing any pain. ALSO he had a right knee skin tear within the last week or 2. He is not really dressing either 1 of these areas Past medical; pacemaker, type 2 diabetes with PAD, osteomyelitis of the left first toe amputated on 09/29/2019, hypertension hypercholesterolemia, COPD 2/28; this is a 87 year old man who arrives I believe with his son. He is a type II diabetic. He has a wound on the medial part of the first metatarsal head. Culture I did last time showed acid Enterobacter as well as rare Enterococcus. Will be giving him Augmentin to cover both of these for 10 days. His x-ray did not show osteomyelitis although they did comment on possible cortical erosion along the medial aspect of the head of the first metatarsal. He will definitely need an MRI. He comes into the clinic today with a new wound on the left third toe dorsal aspect over the PIP this also has exposed bone 05/25/2020 upon evaluation today patient appears to be doing well with regard to his wounds currently. Both are fairly clean. He is having a CT scan of his right foot on Friday. With that being said the toe on the left foot appears to be doing okay this is not too deep. There does not appear to be any signs of active infection at this time. No fevers, chills, nausea, vomiting, or diarrhea. Objective Constitutional Well-nourished and well-hydrated in no acute distress. Vitals Time Taken: 12:47 PM, Height: 73 in, Weight: 217 lbs, BMI: 28.6, Temperature: 97.5 F, Pulse: 77 bpm, Respiratory Rate: 16 breaths/min, Blood Pressure: 125/67 mmHg, Capillary Blood Glucose: 113 mg/dl. Cardiovascular regular rate and rhythm with normal S1, S2. Psychiatric this patient is able to make decisions and demonstrates good insight into disease process. Alert and Oriented x 3. pleasant and cooperative. General Notes: Patient's wounds currently did require some sharp  debridement in regard to the right foot. I cleared away some of the callus as well as slough and subcutaneous tissue down to healthy subcutaneous tissue. He tolerated that without complication. This again is the deeper of the 2 wounds. There was no significant sharp debridement necessary on the toe on the left foot. Integumentary (Hair, Skin) Wound #1 status  is Open. Original cause of wound was Blister. The date acquired was: 02/18/2020. The wound has been in treatment 2 weeks. The wound is located on the Right,Medial Foot. The wound measures 0.8cm length x 0.4cm width x 0.6cm depth; 0.251cm^2 area and 0.151cm^3 volume. There is bone and Fat Layer (Subcutaneous Tissue) exposed. There is no tunneling or undermining noted. There is a medium amount of serosanguineous drainage noted. The wound margin is well defined and not attached to the wound base. There is large (67-100%) red, pink granulation within the wound bed. There is a small (1-33%) amount of necrotic tissue within the wound bed including Adherent Slough. Wound #3 status is Open. Original cause of wound was Gradually Appeared. The date acquired was: 05/17/2020. The wound has been in treatment 1 weeks. The wound is located on the Left T Third. The wound measures 0.5cm length x 0.4cm width x 0.2cm depth; 0.157cm^2 area and 0.031cm^3 volume. There is bone oe and Fat Layer (Subcutaneous Tissue) exposed. There is no tunneling or undermining noted. There is a small amount of serosanguineous drainage noted. The wound margin is flat and intact. There is large (67-100%) pink granulation within the wound bed. There is a small (1-33%) amount of necrotic tissue within the wound bed. Assessment Active Problems ICD-10 Type 2 diabetes mellitus with foot ulcer Type 2 diabetes mellitus with diabetic peripheral angiopathy without gangrene Non-pressure chronic ulcer of other part of right foot with necrosis of bone Non-pressure chronic ulcer of other part of  right foot with other specified severity Abrasion, right knee, subsequent encounter Procedures Wound #1 Pre-procedure diagnosis of Wound #1 is a Diabetic Wound/Ulcer of the Lower Extremity located on the Right,Medial Foot .Severity of Tissue Pre Debridement is: Fat layer exposed. There was a Excisional Skin/Subcutaneous Tissue Debridement with a total area of 0.32 sq cm performed by Worthy Keeler, PA. With the following instrument(s): Curette to remove Viable and Non-Viable tissue/material. Material removed includes Callus, Subcutaneous Tissue, Slough, and Skin: Dermis after achieving pain control using Lidocaine. No specimens were taken. A time out was conducted at 13:00, prior to the start of the procedure. A Minimum amount of bleeding was controlled with Pressure. The procedure was tolerated well with a pain level of 0 throughout and a pain level of 0 following the procedure. Post Debridement Measurements: 0.8cm length x 0.4cm width x 0.6cm depth; 0.151cm^3 volume. Character of Wound/Ulcer Post Debridement is improved. Severity of Tissue Post Debridement is: Fat layer exposed. Post procedure Diagnosis Wound #1: Same as Pre-Procedure Plan Follow-up Appointments: Return Appointment in 1 week. Bathing/ Shower/ Hygiene: May shower with protection but do not get wound dressing(s) wet. Off-Loading: Open toe surgical shoe to: - right foot Home Health: No change in wound care orders this week; continue Home Health for wound care. May utilize formulary equivalent dressing for wound treatment orders unless otherwise specified. Dressing changes to be completed by [place] [facility] on Monday / Wednesday / Friday except when patient has scheduled visit at [facility] will change on Monday, home health to change on Wednesday and Friday. Pt has CT scan on Friday which he will need to remove dressing for....if at all possible can home health go after pt.'s CT scan on Fridayo Other [facility] ordered were: X-ray, foot - left foot; left 3rd toe ulcer WOUND #1: - Foot Wound Laterality: Right, Medial Cleanser: Wound Cleanser (Home Health) 3 x Per Week/7 Days Discharge Instructions: Cleanse the wound with  wound cleanser or normal saline prior to applying a clean dressing using gauze sponges, not tissue or cotton balls. Prim Dressing: KerraCel Ag Gelling Fiber Dressing, 2x2 in (silver alginate) (Home Health) 3 x Per Week/7 Days ary Discharge Instructions: lightly pack into tunneling/undermining Secondary Dressing: Woven Gauze Sponges 2x2 in (Home Health) 3 x Per Week/7 Days Discharge Instructions: Apply over primary dressing as directed. Secondary Dressing: Optifoam Non-Adhesive Dressing, 4x4 in (Home Health) 3 x Per Week/7 Days Discharge Instructions: Foam donut Secured With: Kerlix Roll Sterile, 4.5x3.1 (in/yd) ([place]) 3 x Per Week/7 Days Discharge Instructions: Secure with Kerlix as directed. Secured With: Paper T ape, 2x10 (in/yd) (Home Health) 3 x Per Week/7 Days Discharge Instructions: Secure dressing with tape as directed. WOUND #3: - T Third Wound Laterality: Left oe Cleanser: Wound Cleanser (Home Health) 3 x Per Week/7 Days Discharge Instructions: Cleanse the wound with wound cleanser or normal saline prior to applying a clean dressing using gauze sponges, not tissue or cotton balls. Prim Dressing: KerraCel Ag Gelling Fiber Dressing, 2x2 in (silver alginate) (Home Health) 3 x Per Week/7 Days ary Discharge Instructions: Apply silver alginate to wound bed as instructed Secondary Dressing: Woven Gauze Sponges 2x2 in ([place]) 3 x Per Week/7 Days Discharge Instructions: Apply over primary dressing as directed. Secured With: [employer], 4.5x3.1 (in/yd) (Home Health) 3 x Per Week/7 Days Discharge Instructions: Secure with Kerlix as directed. Secured With: Paper T ape, 2x10 (in/yd) (Home Health) 3 x Per Week/7  Days Discharge Instructions: Secure dressing with tape as directed. 1. Would recommend currently we continue with silver alginate for both wounds. I think that still the right way to go. 2. I am also can recommend that we have the patient continue to monitor for any signs of infection. Office he has a Emergency planning/management officer, and as well if she notices anything she is to let us know soon as possible. 3. I am also can recommend that we have the patient take the dressing off so he does not have the silver alginate in place when he goes for CT scan on Friday. I think that he needs to ensure there is no major complications in that regard. I do not want this to obscure anything as far as what they are trying to see visually and though it may not do so I do not want to take that chance. Obviously if it were an MRI he would definitely not be able to have this in place. We will see patient back for reevaluation in 1 week here in the clinic. If anything worsens or changes patient will contact our office for additional recommendations. Electronic Signature(s) Signed: 05/26/2020 8:33:10 AM By: Worthy Keeler PA-C Entered By: Worthy Keeler on 05/26/2020 08:33:09 -------------------------------------------------------------------------------- SuperBill Details Patient Name: Date of Service: [name] 05/25/2020 Medical Record Number: [id number] Patient Account Number: [id number] Date of Birth/Sex: Treating RN: [birth year] (87 y.o. [name], [name] Primary Care Provider: [name] Other Clinician: Referring Provider: Treating Provider/Extender: Consuelo Pandy, [name] Weeks in Treatment: 2 Diagnosis Coding ICD-10 Codes Code Description [id number] Type 2 diabetes mellitus with foot ulcer E11.51 Type 2 diabetes mellitus with diabetic peripheral angiopathy without gangrene L97.514 Non-pressure chronic ulcer of other part of right foot with necrosis of bone L97.518 Non-pressure chronic ulcer of  other part of right foot with other specified severity S80.211D Abrasion, right knee, subsequent encounter Facility Procedures CPT4 Code: [id number] Description: 11042 - DEB SUBQ TISSUE 20 SQ CM/<  ICD-10 Diagnosis Description L97.514 Non-pressure chronic ulcer of other part of right foot with necrosis of bone L97.518 Non-pressure chronic ulcer of other part of right foot with other specified sev Modifier: erity Quantity: 1 Physician Procedures : CPT4 Code Description Modifier [id number] 11042 - WC PHYS SUBQ TISS 20 SQ CM ICD-10 Diagnosis Description L97.514 Non-pressure chronic ulcer of other part of right foot with necrosis of bone L97.518 Non-pressure chronic ulcer of other part of right foot  with other specified severity Quantity: 1 Electronic Signature(s) Signed: 05/26/2020 8:33:22 AM By: Worthy Keeler PA-C Previous Signature: 05/26/2020 8:28:17 AM Version By: Worthy Keeler PA-C Entered By: Worthy Keeler on 05/26/2020 08:33:22

## 2020-05-26 NOTE — Progress Notes (Signed)
Peter James ([id number]) Visit Report for 05/25/2020 Arrival Information Details Patient Name: Date of Service: Peter James 05/25/2020 12:45 PM Medical Record Number: [id number] Patient Account Number: [id number] Date of Birth/Sex: Treating RN: Jan 23, 1926 (87 y.o. Peter James Primary Care Peter James: Peter James Other Clinician: Referring Peter James: Treating Peter James/Extender: Peter James, Peter James Weeks in Treatment: 2 Visit Information History Since Last Visit Added or deleted any medications: No Patient Arrived: Cane Any new allergies or adverse reactions: No Arrival Time: 12:45 Had a fall or experienced change in No Accompanied By: wife activities of daily living that may affect Transfer Assistance: None risk of falls: Patient Identification Verified: Yes Signs or symptoms of abuse/neglect since No Secondary Verification Process Completed: Yes last visito Patient Requires Transmission-Based No Hospitalized since last visit: No Precautions: Implantable device outside of the clinic No Patient Has Alerts: Yes excluding Patient Alerts: ABI's 12/21: R:0.75 cellular tissue based products placed in the L0.77 center since last visit: Has Dressing in [place] as Prescribed: Yes Has Footwear/Offloading in Place as Yes Prescribed: Right: Surgical Shoe with Pressure Relief Insole Pain Present Now: No Electronic Signature(s) Signed: 05/25/2020 5:55:08 PM By: Peter James Entered By: Peter James on 05/25/2020 12:52:08 -------------------------------------------------------------------------------- Encounter Discharge Information Details Patient Name: Date of Service: Peter James 05/25/2020 12:45 PM Medical Record Number: [id number] Patient Account Number: [id number] Date of Birth/Sex: Treating RN: 05-Aug-1925 (87 y.o. Peter James Primary Care Peter James: Peter James Other Clinician: Referring Peter James: Treating Peter James/Extender: Peter James,  Peter James Weeks in Treatment: 2 Encounter Discharge Information Items Discharge Condition: Stable Ambulatory Status: Cane Discharge Destination: Home Transportation: Private Auto Accompanied By: wife Schedule Follow-up Appointment: Yes Clinical Summary of Care: Electronic Signature(s) Signed: 05/25/2020 5:55:08 PM By: Peter James Entered By: Peter James on 05/25/2020 15:45:48 -------------------------------------------------------------------------------- Lower Extremity Assessment Details Patient Name: Date of Service: Peter James 05/25/2020 12:45 PM Medical Record Number: [id number] Patient Account Number: [id number] Date of Birth/Sex: Treating RN: [birth year] (87 y.o. Peter James Primary Care Peter James: Peter James Other Clinician: Referring Peter James: Treating Peter James/Extender: Peter James, Peter James Weeks in Treatment: 2 Edema Assessment Assessed: [Left: Yes] [Right: Yes] Edema: [Left: No] [Right: No] Calf Left: Right: Point of Measurement: 42 cm From Medial Instep 36 cm 36 cm Ankle Left: Right: Point of Measurement: 23 cm From Medial Instep 22 cm 22.5 cm Vascular Assessment Pulses: Dorsalis Pedis Palpable: [Left:Yes] [Right:Yes] Electronic Signature(s) Signed: 05/25/2020 5:55:08 PM By: Peter James Entered By: Peter James on 05/25/2020 12:54:37 -------------------------------------------------------------------------------- Multi-Disciplinary Care Plan Details Patient Name: Date of Service: Peter James 05/25/2020 12:45 PM Medical Record Number: [id number] Patient Account Number: [id number] Date of Birth/Sex: Treating RN: Feb 04, 1926 (87 y.o. Peter James, Peter James Primary Care Peter James: Peter James Other Clinician: Referring Peter James: Treating Peter James/Extender: Peter James Weeks in Treatment: 2 Multidisciplinary Care Plan reviewed with physician Active Inactive Nutrition Nursing Diagnoses: Impaired glucose control: actual  or potential Potential for alteratiion in Nutrition/Potential for imbalanced nutrition Goals: Patient/caregiver agrees to and verbalizes understanding of need to use nutritional supplements and/or vitamins as prescribed Date Initiated: 05/10/2020 Target Resolution Date: 06/11/2020 Goal Status: Active Patient/caregiver will maintain therapeutic glucose control Date Initiated: 05/10/2020 Target Resolution Date: 06/11/2020 Goal Status: Active Interventions: Assess HgA1c results as ordered upon admission and as needed Assess patient nutrition upon admission and as needed per policy Provide education on elevated blood sugars and impact on wound healing Provide education on nutrition Treatment Activities: Education provided on  Nutrition : 05/17/2020 Notes: Wound/Skin Impairment Nursing Diagnoses: Impaired tissue integrity Knowledge deficit related to ulceration/compromised skin integrity Goals: Patient/caregiver will verbalize understanding of skin care regimen Date Initiated: 05/10/2020 Target Resolution Date: 06/11/2020 Goal Status: Active Ulcer/skin breakdown will have a volume reduction of 30% by week 4 Date Initiated: 05/10/2020 Target Resolution Date: 06/11/2020 Goal Status: Active Interventions: Assess patient/caregiver ability to obtain necessary supplies Assess patient/caregiver ability to perform ulcer/skin care regimen upon admission and as needed Assess ulceration(s) every visit Provide education on ulcer and skin care Notes: Electronic Signature(s) Signed: 05/26/2020 5:36:09 PM By: Peter Hammock RN Entered By: Peter James on 05/25/2020 13:09:19 -------------------------------------------------------------------------------- Pain Assessment Details Patient Name: Date of Service: Peter James 05/25/2020 12:45 PM Medical Record Number: [id number] Patient Account Number: [id number] Date of Birth/Sex: Treating RN: 01-23-1926 (87 y.o. Peter James Primary Care  Peter James: Peter James Other Clinician: Referring Peter James: Treating Peter James/Extender: Peter James, Peter James Weeks in Treatment: 2 Active Problems Location of Pain Severity and Description of Pain Patient Has Paino No Site Locations Rate the pain. Rate the pain. Current Pain Level: 0 Pain Management and Medication Current Pain Management: Medication: No Cold Application: No Rest: No Massage: No Activity: No T.E.N.S.: No Heat Application: No Leg drop or elevation: No Is the Current Pain Management Adequate: Adequate How does your wound impact your activities of daily livingo Sleep: No Bathing: No Appetite: No Relationship With Others: No Bladder Continence: No Emotions: No Bowel Continence: No Work: No Toileting: No Drive: No Dressing: No Hobbies: No Electronic Signature(s) Signed: 05/25/2020 5:55:08 PM By: Peter James Entered By: Peter James on 05/25/2020 12:53:21 -------------------------------------------------------------------------------- Patient/Caregiver Education Details Patient Name: Date of Service: Peter James 3/8/2022andnbsp12:45 PM Medical Record Number: [id number] Patient Account Number: [id number] Date of Birth/Gender: Treating RN: 1925/05/05 (87 y.o. [name] Primary Care Physician: Peter James Other Clinician: Referring Physician: Treating Physician/Extender: Peter James Weeks in Treatment: 2 Education Assessment Education Provided To: Patient Education Topics Provided Elevated Blood Sugar/ Impact on Healing: Methods: Explain/Verbal Responses: State content correctly Electronic Signature(s) Signed: 05/26/2020 5:36:09 PM By: Peter Hammock RN Entered By: Peter James on 05/25/2020 13:12:10 -------------------------------------------------------------------------------- Wound Assessment Details Patient Name: Date of Service: Peter James 05/25/2020 12:45 PM Medical Record Number:  [id number] Patient Account Number: [id number] Date of Birth/Sex: Treating RN: [birth year] (87 y.o. Peter James Primary Care Shahir Karen: Peter James Other Clinician: Referring Monserrat Vidaurri: Treating Chace Klippel/Extender: Peter James, Peter James Weeks in Treatment: 2 Wound Status Wound Number: 1 Primary Diabetic Wound/Ulcer of the Lower Extremity Etiology: Wound Location: Right, Medial Foot Wound Open Wounding Event: Blister Status: Date Acquired: 02/18/2020 Comorbid Coronary Artery Disease, Hypertension, Peripheral Arterial Weeks Of Treatment: 2 History: Disease, Type II Diabetes, Neuropathy Clustered Wound: No Photos Wound Measurements Length: (cm) 0.8 Width: (cm) 0.4 Depth: (cm) 0.6 Area: (cm) 0.251 Volume: (cm) 0.151 % Reduction in Area: -167% % Reduction in Volume: -101.3% Epithelialization: None Tunneling: No Undermining: No Wound Description Classification: Grade 2 Wound Margin: Well defined, not attached Exudate Amount: Medium Exudate Type: Serosanguineous Exudate Color: red, brown Foul Odor After Cleansing: No Slough/Fibrino Yes Wound Bed Granulation Amount: Large (67-100%) Exposed Structure Granulation Quality: Red, Pink Fascia Exposed: No Necrotic Amount: Small (1-33%) Fat Layer (Subcutaneous Tissue) Exposed: Yes Necrotic Quality: Adherent Slough Tendon Exposed: No Muscle Exposed: No Joint Exposed: No Bone Exposed: Yes Treatment Notes Wound #1 (Foot) Wound Laterality: Right, Medial Cleanser Wound Cleanser Discharge Instruction: Cleanse the wound with wound  cleanser or normal saline prior to applying a clean dressing using gauze sponges, not tissue or cotton balls. Peri-Wound Care Topical Primary Dressing KerraCel Ag Gelling Fiber Dressing, 2x2 in (silver alginate) Discharge Instruction: lightly pack into tunneling/undermining Secondary Dressing Woven Gauze Sponges 2x2 in Discharge Instruction: Apply over primary dressing as directed. Optifoam  Non-Adhesive Dressing, 4x4 in Discharge Instruction: Foam donut Secured With [employer] Sterile, 4.5x3.1 (in/yd) Discharge Instruction: Secure with Kerlix as directed. Paper Tape, 2x10 (in/yd) Discharge Instruction: Secure dressing with tape as directed. Compression Wrap Compression Stockings Add-Ons Electronic Signature(s) Signed: 05/26/2020 4:22:28 PM By: Sandre Kitty Signed: 05/26/2020 5:01:55 PM By: Peter James Previous Signature: 05/25/2020 5:55:08 PM Version By: Peter James Entered By: Sandre Kitty on 05/26/2020 11:18:38 -------------------------------------------------------------------------------- Wound Assessment Details Patient Name: Date of Service: Peter James 05/25/2020 12:45 PM Medical Record Number: [id number] Patient Account Number: [id number] Date of Birth/Sex: Treating RN: [birth year] (87 y.o. Peter James Primary Care Imraan Wendell: Peter James Other Clinician: Referring Riese Hellard: Treating Makynzi Eastland/Extender: Peter James, Peter James Weeks in Treatment: 2 Wound Status Wound Number: 3 Primary Diabetic Wound/Ulcer of the Lower Extremity Etiology: Wound Location: Left T Third oe Wound Open Wounding Event: Gradually Appeared Status: Date Acquired: 05/17/2020 Comorbid Coronary Artery Disease, Hypertension, Peripheral Arterial Weeks Of Treatment: 1 History: Disease, Type II Diabetes, Neuropathy Clustered Wound: No Photos Wound Measurements Length: (cm) 0.5 Width: (cm) 0.4 Depth: (cm) 0.2 Area: (cm) 0.157 Volume: (cm) 0.031 % Reduction in Area: -67% % Reduction in Volume: -244.4% Epithelialization: Medium (34-66%) Tunneling: No Undermining: No Wound Description Classification: Grade 2 Wound Margin: Flat and Intact Exudate Amount: Small Exudate Type: Serosanguineous Exudate Color: red, brown Foul Odor After Cleansing: No Slough/Fibrino No Wound Bed Granulation Amount: Large (67-100%) Exposed Structure Granulation Quality:  Pink Fascia Exposed: No Necrotic Amount: Small (1-33%) Fat Layer (Subcutaneous Tissue) Exposed: Yes Tendon Exposed: No Muscle Exposed: No Joint Exposed: No Bone Exposed: Yes Treatment Notes Wound #3 (Toe Third) Wound Laterality: Left Cleanser Wound Cleanser Discharge Instruction: Cleanse the wound with wound cleanser or normal saline prior to applying a clean dressing using gauze sponges, not tissue or cotton balls. Peri-Wound Care Topical Primary Dressing KerraCel Ag Gelling Fiber Dressing, 2x2 in (silver alginate) Discharge Instruction: Apply silver alginate to wound bed as instructed Secondary Dressing Woven Gauze Sponges 2x2 in Discharge Instruction: Apply over primary dressing as directed. Secured With [employer], 4.5x3.1 (in/yd) Discharge Instruction: Secure with Kerlix as directed. Paper Tape, 2x10 (in/yd) Discharge Instruction: Secure dressing with tape as directed. Compression Wrap Compression Stockings Add-Ons Electronic Signature(s) Signed: 05/26/2020 4:22:28 PM By: Sandre Kitty Signed: 05/26/2020 5:01:55 PM By: Peter James Previous Signature: 05/25/2020 5:55:08 PM Version By: Peter James Entered By: Sandre Kitty on 05/26/2020 11:18:23 -------------------------------------------------------------------------------- Vitals Details Patient Name: Date of Service: Peter James 05/25/2020 12:45 PM Medical Record Number: [id number] Patient Account Number: [id number] Date of Birth/Sex: Treating RN: Jul 23, 1925 (87 y.o. Peter James Primary Care Lehman Whiteley: Peter James Other Clinician: Referring Caryl Fate: Treating Toniette Devera/Extender: Peter James, Peter James Weeks in Treatment: 2 Vital Signs Time Taken: 12:47 Temperature (F): 97.5 Height (in): 73 Pulse (bpm): 77 Weight (lbs): 217 Respiratory Rate (breaths/min): 16 Body Mass Index (BMI): 28.6 Blood Pressure (mmHg): 125/67 Capillary Blood Glucose (mg/dl): 113 Reference Range: 80 - 120  mg / dl Electronic Signature(s) Signed: 05/25/2020 5:55:08 PM By: Peter James Entered By: Peter James on 05/25/2020 12:52:44

## 2020-05-28 ENCOUNTER — Encounter (HOSPITAL_COMMUNITY): Payer: Self-pay

## 2020-05-28 ENCOUNTER — Ambulatory Visit (HOSPITAL_COMMUNITY): Payer: Medicare HMO

## 2020-05-28 DIAGNOSIS — I5030 Unspecified diastolic (congestive) heart failure: Secondary | ICD-10-CM | POA: Diagnosis not present

## 2020-05-28 DIAGNOSIS — I110 Hypertensive heart disease with heart failure: Secondary | ICD-10-CM | POA: Diagnosis not present

## 2020-05-28 DIAGNOSIS — E1151 Type 2 diabetes mellitus with diabetic peripheral angiopathy without gangrene: Secondary | ICD-10-CM | POA: Diagnosis not present

## 2020-05-28 DIAGNOSIS — E1140 Type 2 diabetes mellitus with diabetic neuropathy, unspecified: Secondary | ICD-10-CM | POA: Diagnosis not present

## 2020-05-28 DIAGNOSIS — E785 Hyperlipidemia, unspecified: Secondary | ICD-10-CM | POA: Diagnosis not present

## 2020-05-28 DIAGNOSIS — L97512 Non-pressure chronic ulcer of other part of right foot with fat layer exposed: Secondary | ICD-10-CM | POA: Diagnosis not present

## 2020-05-28 DIAGNOSIS — Z4781 Encounter for orthopedic aftercare following surgical amputation: Secondary | ICD-10-CM | POA: Diagnosis not present

## 2020-05-28 DIAGNOSIS — E11621 Type 2 diabetes mellitus with foot ulcer: Secondary | ICD-10-CM | POA: Diagnosis not present

## 2020-05-28 DIAGNOSIS — I2510 Atherosclerotic heart disease of native coronary artery without angina pectoris: Secondary | ICD-10-CM | POA: Diagnosis not present

## 2020-05-31 DIAGNOSIS — L97512 Non-pressure chronic ulcer of other part of right foot with fat layer exposed: Secondary | ICD-10-CM | POA: Diagnosis not present

## 2020-05-31 DIAGNOSIS — I5030 Unspecified diastolic (congestive) heart failure: Secondary | ICD-10-CM | POA: Diagnosis not present

## 2020-05-31 DIAGNOSIS — E785 Hyperlipidemia, unspecified: Secondary | ICD-10-CM | POA: Diagnosis not present

## 2020-05-31 DIAGNOSIS — E1151 Type 2 diabetes mellitus with diabetic peripheral angiopathy without gangrene: Secondary | ICD-10-CM | POA: Diagnosis not present

## 2020-05-31 DIAGNOSIS — E1140 Type 2 diabetes mellitus with diabetic neuropathy, unspecified: Secondary | ICD-10-CM | POA: Diagnosis not present

## 2020-05-31 DIAGNOSIS — E11621 Type 2 diabetes mellitus with foot ulcer: Secondary | ICD-10-CM | POA: Diagnosis not present

## 2020-05-31 DIAGNOSIS — I110 Hypertensive heart disease with heart failure: Secondary | ICD-10-CM | POA: Diagnosis not present

## 2020-05-31 DIAGNOSIS — Z4781 Encounter for orthopedic aftercare following surgical amputation: Secondary | ICD-10-CM | POA: Diagnosis not present

## 2020-05-31 DIAGNOSIS — I2510 Atherosclerotic heart disease of native coronary artery without angina pectoris: Secondary | ICD-10-CM | POA: Diagnosis not present

## 2020-06-01 ENCOUNTER — Other Ambulatory Visit: Payer: Self-pay

## 2020-06-01 ENCOUNTER — Ambulatory Visit (HOSPITAL_COMMUNITY)
Admission: RE | Admit: 2020-06-01 | Discharge: 2020-06-01 | Disposition: A | Discharge status: Home / Self Care | Payer: Medicare HMO | Source: Ambulatory Visit | Attending: Internal Medicine | Admitting: Internal Medicine

## 2020-06-01 ENCOUNTER — Encounter (HOSPITAL_BASED_OUTPATIENT_CLINIC_OR_DEPARTMENT_OTHER): Payer: Medicare HMO | Admitting: Internal Medicine

## 2020-06-01 ENCOUNTER — Other Ambulatory Visit (HOSPITAL_COMMUNITY): Payer: Self-pay | Admitting: Internal Medicine

## 2020-06-01 ENCOUNTER — Encounter (HOSPITAL_COMMUNITY): Payer: Self-pay

## 2020-06-01 DIAGNOSIS — L97519 Non-pressure chronic ulcer of other part of right foot with unspecified severity: Secondary | ICD-10-CM | POA: Insufficient documentation

## 2020-06-01 DIAGNOSIS — R52 Pain, unspecified: Secondary | ICD-10-CM

## 2020-06-01 DIAGNOSIS — Z8739 Personal history of other diseases of the musculoskeletal system and connective tissue: Secondary | ICD-10-CM | POA: Diagnosis not present

## 2020-06-01 DIAGNOSIS — E13621 Other specified diabetes mellitus with foot ulcer: Secondary | ICD-10-CM | POA: Insufficient documentation

## 2020-06-01 DIAGNOSIS — M1288 Other specific arthropathies, not elsewhere classified, other specified site: Secondary | ICD-10-CM | POA: Diagnosis not present

## 2020-06-01 DIAGNOSIS — E11621 Type 2 diabetes mellitus with foot ulcer: Secondary | ICD-10-CM | POA: Diagnosis not present

## 2020-06-01 DIAGNOSIS — M19071 Primary osteoarthritis, right ankle and foot: Secondary | ICD-10-CM | POA: Diagnosis not present

## 2020-06-01 DIAGNOSIS — S98112A Complete traumatic amputation of left great toe, initial encounter: Secondary | ICD-10-CM | POA: Diagnosis not present

## 2020-06-01 DIAGNOSIS — S91101A Unspecified open wound of right great toe without damage to nail, initial encounter: Secondary | ICD-10-CM | POA: Diagnosis not present

## 2020-06-01 DIAGNOSIS — Z89412 Acquired absence of left great toe: Secondary | ICD-10-CM | POA: Diagnosis not present

## 2020-06-02 DIAGNOSIS — E1151 Type 2 diabetes mellitus with diabetic peripheral angiopathy without gangrene: Secondary | ICD-10-CM | POA: Diagnosis not present

## 2020-06-02 DIAGNOSIS — E785 Hyperlipidemia, unspecified: Secondary | ICD-10-CM | POA: Diagnosis not present

## 2020-06-02 DIAGNOSIS — I5030 Unspecified diastolic (congestive) heart failure: Secondary | ICD-10-CM | POA: Diagnosis not present

## 2020-06-02 DIAGNOSIS — L97512 Non-pressure chronic ulcer of other part of right foot with fat layer exposed: Secondary | ICD-10-CM | POA: Diagnosis not present

## 2020-06-02 DIAGNOSIS — Z4781 Encounter for orthopedic aftercare following surgical amputation: Secondary | ICD-10-CM | POA: Diagnosis not present

## 2020-06-02 DIAGNOSIS — I2510 Atherosclerotic heart disease of native coronary artery without angina pectoris: Secondary | ICD-10-CM | POA: Diagnosis not present

## 2020-06-02 DIAGNOSIS — I110 Hypertensive heart disease with heart failure: Secondary | ICD-10-CM | POA: Diagnosis not present

## 2020-06-02 DIAGNOSIS — E11621 Type 2 diabetes mellitus with foot ulcer: Secondary | ICD-10-CM | POA: Diagnosis not present

## 2020-06-02 DIAGNOSIS — E1140 Type 2 diabetes mellitus with diabetic neuropathy, unspecified: Secondary | ICD-10-CM | POA: Diagnosis not present

## 2020-06-03 ENCOUNTER — Ambulatory Visit (HOSPITAL_COMMUNITY): Payer: Self-pay

## 2020-06-03 ENCOUNTER — Ambulatory Visit (INDEPENDENT_AMBULATORY_CARE_PROVIDER_SITE_OTHER)
Admission: RE | Admit: 2020-06-03 | Discharge: 2020-06-03 | Disposition: A | Discharge status: Home / Self Care | Payer: No Typology Code available for payment source | Source: Ambulatory Visit | Attending: Vascular Surgery | Admitting: Vascular Surgery

## 2020-06-03 ENCOUNTER — Encounter (HOSPITAL_BASED_OUTPATIENT_CLINIC_OR_DEPARTMENT_OTHER): Payer: Medicare HMO | Admitting: Internal Medicine

## 2020-06-03 ENCOUNTER — Ambulatory Visit (HOSPITAL_COMMUNITY)
Admission: RE | Admit: 2020-06-03 | Discharge: 2020-06-03 | Disposition: A | Discharge status: Home / Self Care | Payer: No Typology Code available for payment source | Source: Ambulatory Visit | Attending: Vascular Surgery | Admitting: Vascular Surgery

## 2020-06-03 ENCOUNTER — Ambulatory Visit (HOSPITAL_COMMUNITY): Admission: RE | Admit: 2020-06-03 | Payer: No Typology Code available for payment source | Source: Ambulatory Visit

## 2020-06-03 ENCOUNTER — Other Ambulatory Visit: Payer: Self-pay

## 2020-06-03 DIAGNOSIS — E11621 Type 2 diabetes mellitus with foot ulcer: Secondary | ICD-10-CM | POA: Diagnosis not present

## 2020-06-03 DIAGNOSIS — L97512 Non-pressure chronic ulcer of other part of right foot with fat layer exposed: Secondary | ICD-10-CM | POA: Diagnosis not present

## 2020-06-03 DIAGNOSIS — S80211A Abrasion, right knee, initial encounter: Secondary | ICD-10-CM | POA: Diagnosis not present

## 2020-06-03 DIAGNOSIS — L97514 Non-pressure chronic ulcer of other part of right foot with necrosis of bone: Secondary | ICD-10-CM | POA: Diagnosis not present

## 2020-06-03 DIAGNOSIS — I739 Peripheral vascular disease, unspecified: Secondary | ICD-10-CM | POA: Diagnosis not present

## 2020-06-03 DIAGNOSIS — L97518 Non-pressure chronic ulcer of other part of right foot with other specified severity: Secondary | ICD-10-CM | POA: Diagnosis not present

## 2020-06-03 DIAGNOSIS — I10 Essential (primary) hypertension: Secondary | ICD-10-CM | POA: Diagnosis not present

## 2020-06-03 DIAGNOSIS — Z950 Presence of cardiac pacemaker: Secondary | ICD-10-CM | POA: Diagnosis not present

## 2020-06-03 DIAGNOSIS — Z89412 Acquired absence of left great toe: Secondary | ICD-10-CM | POA: Diagnosis not present

## 2020-06-03 DIAGNOSIS — E1151 Type 2 diabetes mellitus with diabetic peripheral angiopathy without gangrene: Secondary | ICD-10-CM | POA: Diagnosis not present

## 2020-06-03 DIAGNOSIS — J449 Chronic obstructive pulmonary disease, unspecified: Secondary | ICD-10-CM | POA: Diagnosis not present

## 2020-06-03 DIAGNOSIS — S91105A Unspecified open wound of left lesser toe(s) without damage to nail, initial encounter: Secondary | ICD-10-CM | POA: Diagnosis not present

## 2020-06-03 NOTE — Progress Notes (Signed)
Peter James ([id number]) Visit Report for 06/03/2020 HPI Details Patient Name: Date of Service: [name] 06/03/2020 1:45 PM Medical Record Number: [id number] Patient Account Number: [id number] Date of Birth/Sex: Treating RN: [birth year] (87 y.o. Peter James Primary Care Provider: [name] Other Clinician: Referring Provider: Treating Provider/Extender: Greta Doom Weeks in Treatment: 3 History of Present Illness HPI Description: ADMISSION 05/10/2020 This is a independent 87 year old man who has had an area on his right medial first metatarsal head according to him for roughly 3 months. This started as a skin tear that scab he removed the scab and inside an area on this since. He is a type II diabetic also had an area on his left second toe and right first metatarsal head in November so this wound may have been present longer than not. He has undergone extensive arterial review including a stent in the right popliteal laser atherectomy of the right popliteal and anterior tibial arteries on 01/19/2020. On 02/23/2020 he had a laser atherectomy of the left peroneal angioplasty of the left peroneal and amputation of the left second toe. Follow-up noninvasive studies on 03/05/2020 showed an ABI on the right of 0.75 with a TBI of 0.39. On the left his ABI was 0.77, his left great toe was amputated. He is not experiencing any pain. ALSO he had a right knee skin tear within the last week or 2. He is not really dressing either 1 of these areas Past medical; pacemaker, type 2 diabetes with PAD, osteomyelitis of the left first toe amputated on 09/29/2019, hypertension hypercholesterolemia, COPD 2/28; this is a 87 year old man who arrives I believe with his son. He is a type II diabetic. He has a wound on the medial part of the first metatarsal head. Culture I did last time showed acid Enterobacter as well as rare Enterococcus. Will be giving him Augmentin to cover both of  these for 10 days. His x-ray did not show osteomyelitis although they did comment on possible cortical erosion along the medial aspect of the head of the first metatarsal. He will definitely need an MRI. He comes into the clinic today with a new wound on the left third toe dorsal aspect over the PIP this also has exposed bone 05/25/2020 upon evaluation today patient appears to be doing well with regard to his wounds currently. Both are fairly clean. He is having a CT scan of his right foot on Friday. With that being said the toe on the left foot appears to be doing okay this is not too deep. There does not appear to be any signs of active infection at this time. No fevers, chills, nausea, vomiting, or diarrhea. 3/17; this is a patient with a wound on the medial aspect of his right first MTP. This is in the setting of severe PAD with type 2 diabetes. He has previously been revascularized on the side on 02/23/2020 however his post procedure noninvasive studies showed an ABI of 0.75 and a TBI of 0.39. He was supposed to have follow-up noninvasive studies today but because he was here and we were late he missed them but were going to try to book him sometime this week. Patient could not have an MRI of the right foot because of a pacemaker. He did have a CT scan that showed marginal erosions along the medial aspect of the first metatarsal head which may reflect osteomyelitis. The radiologist also queries underlying arthropathy such as gout. He came in 2 weeks ago  with a new wound on his left third toe. This was x-rayed on 3/16 that did not show obvious osteomyelitis by plain Electronic Signature(s) Signed: 06/03/2020 5:46:09 PM By: Linton Ham MD Entered By: Linton Ham on 06/03/2020 15:20:24 -------------------------------------------------------------------------------- Physical Exam Details Patient Name: Date of Service: [name] 06/03/2020 1:45 PM Medical Record Number:  [id number] Patient Account Number: [id number] Date of Birth/Sex: Treating RN: 29-Oct-1925 (87 y.o. Peter James Primary Care Provider: [name] Other Clinician: Referring Provider: Treating Provider/Extender: Greta Doom Weeks in Treatment: 3 Constitutional Sitting or standing Blood Pressure is within target range for patient.. Pulse regular and within target range for patient.[name] Respirations regular, non-labored and within target range.. Temperature is normal and within the target range for the patient.[name] Appears in no distress. Cardiovascular Pedal pulses absent bilaterally.. Notes Wound exam; the patient has a small but punched out wound on the right medial first MTP. This easily probes to bone the tissue on the surface is not viable. He also has an area over the left third toe with exposed bone. No debridement was done in either area Electronic Signature(s) Signed: 06/03/2020 5:46:09 PM By: Linton Ham MD Entered By: Linton Ham on 06/03/2020 15:21:58 -------------------------------------------------------------------------------- Physician Orders Details Patient Name: Date of Service: [name] 06/03/2020 1:45 PM Medical Record Number: [id number] Patient Account Number: [id number] Date of Birth/Sex: Treating RN: 07/23/1925 (87 y.o. Peter James Primary Care Provider: [name] Other Clinician: Referring Provider: Treating Provider/Extender: Greta Doom Weeks in Treatment: 3 Verbal / Phone Orders: No Diagnosis Coding ICD-10 Coding Code Description E11.621 Type 2 diabetes mellitus with foot ulcer E11.51 Type 2 diabetes mellitus with diabetic peripheral angiopathy without gangrene L97.514 Non-pressure chronic ulcer of other part of right foot with necrosis of bone L97.518 Non-pressure chronic ulcer of other part of right foot with other specified severity S80.211D Abrasion, right knee, subsequent encounter Follow-up  Appointments Return Appointment in 1 week. Bathing/ Shower/ Hygiene May shower with protection but do not get wound dressing(s) wet. Off-Loading Open toe surgical shoe to: - right foot Additional Orders / Instructions Other: - Ensure to follow up with Dr. Donzetta Matters office as soon as possible. Home Health No change in wound care orders this week; continue Home Health for wound care. May utilize formulary equivalent dressing for wound treatment orders unless otherwise specified. Dressing changes to be completed by [facility] on Monday / Wednesday / Friday except when patient has scheduled visit at [facility]. Other Home Health Orders/Instructions: - [facility] Wound Treatment Wound #1 - Foot Wound Laterality: Right, Medial Cleanser: Wound Cleanser (Home Health) 3 x Per Week/7 Days Discharge Instructions: Cleanse the wound with wound cleanser or normal saline prior to applying a clean dressing using gauze sponges, not tissue or cotton balls. Prim Dressing: KerraCel Ag Gelling Fiber Dressing, 2x2 in (silver alginate) (Home Health) 3 x Per Week/7 Days ary Discharge Instructions: lightly pack into tunneling/undermining Secondary Dressing: Woven Gauze Sponges 2x2 in (Home Health) 3 x Per Week/7 Days Discharge Instructions: Apply over primary dressing as directed. Secondary Dressing: Optifoam Non-Adhesive Dressing, 4x4 in (Home Health) 3 x Per Week/7 Days Discharge Instructions: Foam donut Secured With: Kerlix Roll Sterile, 4.5x3.1 (in/yd) ([place]) 3 x Per Week/7 Days Discharge Instructions: Secure with Kerlix as directed. Secured With: Paper Tape, 2x10 (in/yd) (Home Health) 3 x Per Week/7 Days Discharge Instructions: Secure dressing with tape as directed. Wound #3 - T Third oe Wound Laterality: Left Cleanser: Wound Cleanser (  Home Health) 3 x Per Week/7 Days Discharge Instructions: Cleanse the wound with wound cleanser or normal saline prior to applying a clean dressing using gauze  sponges, not tissue or cotton balls. Prim Dressing: KerraCel Ag Gelling Fiber Dressing, 2x2 in (silver alginate) (Home Health) 3 x Per Week/7 Days ary Discharge Instructions: Apply silver alginate to wound bed as instructed Secondary Dressing: Woven Gauze Sponges 2x2 in ([place]) 3 x Per Week/7 Days Discharge Instructions: Apply over primary dressing as directed. Secured With: [employer], 4.5x3.1 (in/yd) (Home Health) 3 x Per Week/7 Days Discharge Instructions: Secure with Kerlix as directed. Secured With: Paper Tape, 2x10 (in/yd) (Home Health) 3 x Per Week/7 Days Discharge Instructions: Secure dressing with tape as directed. Consults vascular - Follow up with [facility]. Donzetta Matters with scheduled test and consult. Electronic Signature(s) Signed: 06/03/2020 5:46:09 PM By: Linton Ham MD Signed: 06/03/2020 5:53:43 PM By: Deon Pilling Entered By: Deon Pilling on 06/03/2020 14:50:26 Prescription 06/03/2020 -------------------------------------------------------------------------------- Collene Leyden MD Patient Name: Provider: 05-12-1925 [id number] Date of Birth: NPI#: [id number] Sex: DEA #: [phone] [id number] Phone #: License #: [place] Patient Address: [address] [place], [place] [address] [place], [place] [address] [phone] Allergies No Known Allergies Provider's Orders vascular - Follow up with [facility] Dr. Donzetta Matters with scheduled test and consult. Hand Signature: Date(s): Electronic Signature(s) Signed: 06/03/2020 5:46:09 PM By: Linton Ham MD Signed: 06/03/2020 5:53:43 PM By: Deon Pilling Entered By: Deon Pilling on 06/03/2020 14:50:26 -------------------------------------------------------------------------------- Problem List Details Patient Name: Date of Service: [name] 06/03/2020 1:45 PM Medical Record Number:  [id number] Patient Account Number: [id number] Date of Birth/Sex: Treating RN: March 08, 1926 (87 y.o. Peter James Primary Care Provider: [name] Other Clinician: Referring Provider: Treating Provider/Extender: Greta Doom Weeks in Treatment: 3 Active Problems ICD-10 Encounter Code Description Active Date MDM Diagnosis E11.621 Type 2 diabetes mellitus with foot ulcer 05/10/2020 No Yes E11.51 Type 2 diabetes mellitus with diabetic peripheral angiopathy without gangrene 05/10/2020 No Yes L97.514 Non-pressure chronic ulcer of other part of right foot with necrosis of bone 05/10/2020 No Yes L97.518 Non-pressure chronic ulcer of other part of right foot with other specified 05/17/2020 No Yes severity S80.211D Abrasion, right knee, subsequent encounter 05/10/2020 No Yes Inactive Problems Resolved Problems Electronic Signature(s) Signed: 06/03/2020 5:46:09 PM By: Linton Ham MD Entered By: Linton Ham on 06/03/2020 15:16:25 -------------------------------------------------------------------------------- Progress Note Details Patient Name: Date of Service: [name] 06/03/2020 1:45 PM Medical Record Number: [id number] Patient Account Number: [id number] Date of Birth/Sex: Treating RN: Jun 06, 1925 (87 y.o. Peter James Primary Care Provider: [name] Other Clinician: Referring Provider: Treating Provider/Extender: Greta Doom Weeks in Treatment: 3 Subjective History of Present Illness (HPI) ADMISSION 05/10/2020 This is a independent 87 year old man who has had an area on his right medial first metatarsal head according to him for roughly 3 months. This started as a skin tear that scab he removed the scab and inside an area on this since. He is a type II diabetic also had an area on his left second toe and right first metatarsal head in November so this wound may have been present longer than not. He has undergone extensive arterial review  including a stent in the right popliteal laser atherectomy of the right popliteal and anterior tibial arteries on 01/19/2020. On 02/23/2020 he had a laser atherectomy of the  left peroneal angioplasty of the left peroneal and amputation of the left second toe. Follow-up noninvasive studies on 03/05/2020 showed an ABI on the right of 0.75 with a TBI of 0.39. On the left his ABI was 0.77, his left great toe was amputated. He is not experiencing any pain. ALSO he had a right knee skin tear within the last week or 2. He is not really dressing either 1 of these areas Past medical; pacemaker, type 2 diabetes with PAD, osteomyelitis of the left first toe amputated on 09/29/2019, hypertension hypercholesterolemia, COPD 2/28; this is a 87 year old man who arrives I believe with his son. He is a type II diabetic. He has a wound on the medial part of the first metatarsal head. Culture I did last time showed acid Enterobacter as well as rare Enterococcus. Will be giving him Augmentin to cover both of these for 10 days. His x-ray did not show osteomyelitis although they did comment on possible cortical erosion along the medial aspect of the head of the first metatarsal. He will definitely need an MRI. He comes into the clinic today with a new wound on the left third toe dorsal aspect over the PIP this also has exposed bone 05/25/2020 upon evaluation today patient appears to be doing well with regard to his wounds currently. Both are fairly clean. He is having a CT scan of his right foot on Friday. With that being said the toe on the left foot appears to be doing okay this is not too deep. There does not appear to be any signs of active infection at this time. No fevers, chills, nausea, vomiting, or diarrhea. 3/17; this is a patient with a wound on the medial aspect of his right first MTP. This is in the setting of severe PAD with type 2 diabetes. He has previously been revascularized on the side on 02/23/2020 however his  post procedure noninvasive studies showed an ABI of 0.75 and a TBI of 0.39. He was supposed to have follow-up noninvasive studies today but because he was here and we were late he missed them but were going to try to book him sometime this week. Patient could not have an MRI of the right foot because of a pacemaker. He did have a CT scan that showed marginal erosions along the medial aspect of the first metatarsal head which may reflect osteomyelitis. The radiologist also queries underlying arthropathy such as gout. He came in 2 weeks ago with a new wound on his left third toe. This was x-rayed on 3/16 that did not show obvious osteomyelitis by plain Objective Constitutional Sitting or standing Blood Pressure is within target range for patient.. Pulse regular and within target range for patient.[name] Respirations regular, non-labored and within target range.. Temperature is normal and within the target range for the patient.[name] Appears in no distress. Vitals Time Taken: 1:27 PM, Height: 73 in, Weight: 217 lbs, BMI: 28.6, Temperature: 98.1 F, Capillary Blood Glucose: 134 mg/dl. General Notes: glucose per pt report Cardiovascular Pedal pulses absent bilaterally.. General Notes: Wound exam; the patient has a small but punched out wound on the right medial first MTP. This easily probes to bone the tissue on the surface is not viable. He also has an area over the left third toe with exposed bone. No debridement was done in either area Integumentary (Hair, Skin) Wound #1 status is Open. Original cause of wound was Blister. The date acquired was: 02/18/2020. The wound has been in treatment 3 weeks. The wound  is located on the Right,Medial Foot. The wound measures 0.7cm length x 0.5cm width x 0.6cm depth; 0.275cm^2 area and 0.165cm^3 volume. There is bone and Fat Layer (Subcutaneous Tissue) exposed. There is no tunneling noted, however, there is undermining starting at 12:00 and ending at 12:00 with a  maximum distance of 1cm. There is a medium amount of serosanguineous drainage noted. The wound margin is well defined and not attached to the wound base. There is large (67-100%) red, pink granulation within the wound bed. There is a small (1-33%) amount of necrotic tissue within the wound bed including Adherent Slough. Wound #3 status is Open. Original cause of wound was Gradually Appeared. The date acquired was: 05/17/2020. The wound has been in treatment 2 weeks. The wound is located on the Left T Third. The wound measures 0.7cm length x 1.5cm width x 0.3cm depth; 0.825cm^2 area and 0.247cm^3 volume. There is bone oe and Fat Layer (Subcutaneous Tissue) exposed. There is no tunneling or undermining noted. There is a medium amount of serosanguineous drainage noted. The wound margin is flat and intact. There is large (67-100%) pink granulation within the wound bed. There is a small (1-33%) amount of necrotic tissue within the wound bed including Adherent Slough. Assessment Active Problems ICD-10 Type 2 diabetes mellitus with foot ulcer Type 2 diabetes mellitus with diabetic peripheral angiopathy without gangrene Non-pressure chronic ulcer of other part of right foot with necrosis of bone Non-pressure chronic ulcer of other part of right foot with other specified severity Abrasion, right knee, subsequent encounter Plan Follow-up Appointments: Return Appointment in 1 week. Bathing/ Shower/ Hygiene: May shower with protection but do not get wound dressing(s) wet. Off-Loading: Open toe surgical shoe to: - right foot Additional Orders / Instructions: Other: - Ensure to follow up with Dr. Donzetta Matters office as soon as possible. Home Health: No change in wound care orders this week; continue Home Health for wound care. May utilize formulary equivalent dressing for wound treatment orders unless otherwise specified. Dressing changes to be completed by [place] on Monday / Wednesday / Friday except  when patient has scheduled visit at [facility]. Other Home Health Orders/Instructions: Alvis Lemmings Consults ordered were: vascular - Follow up with [facility]. Donzetta Matters with scheduled test and consult. WOUND #1: - Foot Wound Laterality: Right, Medial Cleanser: Wound Cleanser (Home Health) 3 x Per Week/7 Days Discharge Instructions: Cleanse the wound with wound cleanser or normal saline prior to applying a clean dressing using gauze sponges, not tissue or cotton balls. Prim Dressing: KerraCel Ag Gelling Fiber Dressing, 2x2 in (silver alginate) (Home Health) 3 x Per Week/7 Days ary Discharge Instructions: lightly pack into tunneling/undermining Secondary Dressing: Woven Gauze Sponges 2x2 in (Home Health) 3 x Per Week/7 Days Discharge Instructions: Apply over primary dressing as directed. Secondary Dressing: Optifoam Non-Adhesive Dressing, 4x4 in (Home Health) 3 x Per Week/7 Days Discharge Instructions: Foam donut Secured With: Kerlix Roll Sterile, 4.5x3.1 (in/yd) ([place]) 3 x Per Week/7 Days Discharge Instructions: Secure with Kerlix as directed. Secured With: Paper T ape, 2x10 (in/yd) (Home Health) 3 x Per Week/7 Days Discharge Instructions: Secure dressing with tape as directed. WOUND #3: - T Third Wound Laterality: Left oe Cleanser: Wound Cleanser (Home Health) 3 x Per Week/7 Days Discharge Instructions: Cleanse the wound with wound cleanser or normal saline prior to applying a clean dressing using gauze sponges, not tissue or cotton balls. Prim Dressing: KerraCel Ag Gelling Fiber Dressing, 2x2 in (silver alginate) (Home Health) 3  x Per Week/7 Days ary Discharge Instructions: Apply silver alginate to wound bed as instructed Secondary Dressing: Woven Gauze Sponges 2x2 in (Home Health) 3 x Per Week/7 Days Discharge Instructions: Apply over primary dressing as directed. Secured With: [employer], 4.5x3.1 (in/yd) (Home Health) 3 x Per Week/7 Days Discharge  Instructions: Secure with Kerlix as directed. Secured With: Paper T ape, 2x10 (in/yd) (Home Health) 3 x Per Week/7 Days Discharge Instructions: Secure dressing with tape as directed. 1. Disappointingly he missed his follow-up arterial studies today. 2. We are going to try to get these done. 3. The patient likely has osteomyelitis in the first MTP. I possibly could get a bone specimen here however I would like to make sure that his blood flow was optimized before we do this. 4. He also has an area on the left dorsal third toe with bone exposed as well. I am going to continue with the silver alginate to both wound areas. Electronic Signature(s) Signed: 06/03/2020 5:46:09 PM By: Linton Ham MD Entered By: Linton Ham on 06/03/2020 15:23:36 -------------------------------------------------------------------------------- SuperBill Details Patient Name: Date of Service: [name] 06/03/2020 Medical Record Number: [id number] Patient Account Number: [id number] Date of Birth/Sex: Treating RN: 1925/12/10 (87 y.o. Peter James Primary Care Provider: [name] Other Clinician: Referring Provider: Treating Provider/Extender: Greta Doom Weeks in Treatment: 3 Diagnosis Coding ICD-10 Codes Code Description E11.621 Type 2 diabetes mellitus with foot ulcer E11.51 Type 2 diabetes mellitus with diabetic peripheral angiopathy without gangrene L97.514 Non-pressure chronic ulcer of other part of right foot with necrosis of bone L97.518 Non-pressure chronic ulcer of other part of right foot with other specified severity S80.211D Abrasion, right knee, subsequent encounter Facility Procedures Physician Procedures : CPT4 Code Description Modifier [id number] - WC PHYS LEVEL 3 - EST PT ICD-10 Diagnosis Description L97.514 Non-pressure chronic ulcer of other part of right foot with necrosis of bone E11.51 Type 2 diabetes mellitus with diabetic peripheral  angiopathy  without gangrene Quantity: 1 Electronic Signature(s) Signed: 06/03/2020 5:46:09 PM By: Linton Ham MD Signed: 06/03/2020 5:53:43 PM By: Deon Pilling Entered By: Deon Pilling on 06/03/2020 16:44:42

## 2020-06-03 NOTE — Progress Notes (Signed)
Peter, James ([id number]) Visit Report for 06/03/2020 Arrival Information Details Patient Name: Date of Service: Peter James 06/03/2020 1:45 PM Medical Record Number: [id number] Patient Account Number: [id number] Date of Birth/Sex: Treating RN: 10-19-1925 (87 y.o. Peter James Primary Care Jadyn Brasher: [name] Other Clinician: Referring Antanisha Mohs: Treating Namish Krise/Extender: Greta Doom Weeks in Treatment: 3 Visit Information History Since Last Visit All ordered tests and consults were completed: Yes Patient Arrived: Ambulatory Added or deleted any medications: No Arrival Time: 13:26 Any new allergies or adverse reactions: No Accompanied By: daughter Had a fall or experienced change in No Transfer Assistance: None activities of daily living that may affect Patient Identification Verified: Yes risk of falls: Secondary Verification Process Completed: Yes Signs or symptoms of abuse/neglect since last visito No Patient Requires Transmission-Based Precautions: No Hospitalized since last visit: No Patient Has Alerts: Yes Implantable device outside of the clinic excluding No Patient Alerts: ABI's 12/21: R:0.75 L0.77 cellular tissue based products placed in the center since last visit: Has Dressing in Place as Prescribed: Yes Has Compression in Place as Prescribed: Yes Pain Present Now: No Electronic Signature(s) Signed: 06/03/2020 5:54:24 PM By: Levan Hurst RN, BSN Entered By: Levan Hurst on 06/03/2020 13:27:05 -------------------------------------------------------------------------------- Clinic Level of Care Assessment Details Patient Name: Date of Service: Peter James 06/03/2020 1:45 PM Medical Record Number: [id number] Patient Account Number: [id number] Date of Birth/Sex: Treating RN: 17-Oct-1925 (87 y.o. [name] Primary Care [name]: [name] Other Clinician: Referring [name]: Treating [name]/Extender: Greta Doom Weeks in Treatment: 3 Clinic Level of Care Assessment Items TOOL 4 Quantity Score X- 1 0 Use when only an EandM is performed on FOLLOW-UP visit ASSESSMENTS - Nursing Assessment / Reassessment X- 1 10 Reassessment of Co-morbidities (includes updates in patient status) X- 1 5 Reassessment of Adherence to Treatment Plan ASSESSMENTS - Wound and Skin A ssessment / Reassessment []  - 0 Simple Wound Assessment / Reassessment - one wound X- 2 5 Complex Wound Assessment / Reassessment - multiple wounds X- 1 10 Dermatologic / Skin Assessment (not related to wound area) ASSESSMENTS - Focused Assessment X- 2 5 Circumferential Edema Measurements - multi extremities X- 1 10 Nutritional Assessment / Counseling / Intervention []  - 0 Lower Extremity Assessment (monofilament, tuning fork, pulses) []  - 0 Peripheral Arterial Disease Assessment (using hand held doppler) ASSESSMENTS - Ostomy and/or Continence Assessment and Care []  - 0 Incontinence Assessment and Management []  - 0 Ostomy Care Assessment and Management (repouching, etc.) PROCESS - Coordination of Care []  - 0 Simple Patient / Family Education for ongoing care X- 1 20 Complex (extensive) Patient / Family Education for ongoing care X- 1 10 Staff obtains Programmer, systems, Records, T Results / Process Orders est []  - 0 Staff telephones HHA, Nursing Homes / Clarify orders / etc []  - 0 Routine Transfer to another Facility (non-emergent condition) []  - 0 Routine Hospital Admission (non-emergent condition) []  - 0 New Admissions / Biomedical engineer / Ordering NPWT Apligraf, etc. , []  - 0 Emergency Hospital Admission (emergent condition) []  - 0 Simple Discharge Coordination X- 1 15 Complex (extensive) Discharge Coordination PROCESS - Special Needs []  - 0 Pediatric / Minor Patient Management []  - 0 Isolation Patient Management []  - 0 Hearing / Language / Visual special needs []  - 0 Assessment of  Community assistance (transportation, D/C planning, etc.) []  - 0 Additional assistance / Altered mentation []  - 0 Support Surface(s) Assessment (bed, cushion, seat, etc.) INTERVENTIONS - Wound Cleansing /  Measurement []  - 0 Simple Wound Cleansing - one wound X- 2 5 Complex Wound Cleansing - multiple wounds X- 1 5 Wound Imaging (photographs - any number of wounds) []  - 0 Wound Tracing (instead of photographs) []  - 0 Simple Wound Measurement - one wound X- 2 5 Complex Wound Measurement - multiple wounds INTERVENTIONS - Wound Dressings X - Small Wound Dressing one or multiple wounds 2 10 []  - 0 Medium Wound Dressing one or multiple wounds []  - 0 Large Wound Dressing one or multiple wounds []  - 0 Application of Medications - topical []  - 0 Application of Medications - injection INTERVENTIONS - Miscellaneous []  - 0 External ear exam []  - 0 Specimen Collection (cultures, biopsies, blood, body fluids, etc.) []  - 0 Specimen(s) / Culture(s) sent or taken to Lab for analysis []  - 0 Patient Transfer (multiple staff / Civil Service fast streamer / Similar devices) []  - 0 Simple Staple / Suture removal (25 or less) []  - 0 Complex Staple / Suture removal (26 or more) []  - 0 Hypo / Hyperglycemic Management (close monitor of Blood Glucose) []  - 0 Ankle / Brachial Index (ABI) - do not check if billed separately X- 1 5 Vital Signs Has the patient been seen at the hospital within the last three years: Yes Total Score: 150 Level Of Care: New/Established - Level 4 Electronic Signature(s) Signed: 06/03/2020 5:53:43 PM By: Deon Pilling Entered By: Deon Pilling on 06/03/2020 16:44:38 -------------------------------------------------------------------------------- Encounter Discharge Information Details Patient Name: Date of Service: Peter James 06/03/2020 1:45 PM Medical Record Number: [id number] Patient Account Number: [id number] Date of Birth/Sex: Treating RN: July 04, 1925 (87 y.o. Peter James Primary Care Riva Sesma: [name] Other Clinician: Referring Tahisha Hakim: Treating [name]/Extender: Greta Doom Weeks in Treatment: 3 Encounter Discharge Information Items Discharge Condition: Stable Ambulatory Status: Cane Discharge Destination: Home Transportation: Private Auto Accompanied By: daughter Schedule Follow-up Appointment: Yes Clinical Summary of Care: Patient Declined Electronic Signature(s) Signed: 06/03/2020 5:54:24 PM By: Levan Hurst RN, BSN Entered By: Levan Hurst on 06/03/2020 15:02:21 -------------------------------------------------------------------------------- Lower Extremity Assessment Details Patient Name: Date of Service: Peter James 06/03/2020 1:45 PM Medical Record Number: [id number] Patient Account Number: [id number] Date of Birth/Sex: Treating RN: Jul 28, 1925 (87 y.o. Peter James Primary Care Selim Durden: [name] Other Clinician: Referring Khris Jansson: Treating Helvi Royals/Extender: Greta Doom Weeks in Treatment: 3 Edema Assessment Assessed: [Left: No] [Right: No] Edema: [Left: No] [Right: No] Calf Left: Right: Point of Measurement: 42 cm From Medial Instep 38.2 cm 37 cm Ankle Left: Right: Point of Measurement: 23 cm From Medial Instep 23.5 cm 22.8 cm Vascular Assessment Pulses: Dorsalis Pedis Palpable: [Left:No] [Right:No] Electronic Signature(s) Signed: 06/03/2020 5:54:24 PM By: Levan Hurst RN, BSN Entered By: Levan Hurst on 06/03/2020 13:34:52 -------------------------------------------------------------------------------- Multi Wound Chart Details Patient Name: Date of Service: Peter James 06/03/2020 1:45 PM Medical Record Number: [id number] Patient Account Number: [id number] Date of Birth/Sex: Treating RN: [birth year] (87 y.o. [name] Primary Care Amiri Riechers: [name] Other Clinician: Referring Sharmin Foulk: Treating Dallen Bunte/Extender: Greta Doom Weeks in Treatment: 3 Vital Signs Height(in): 73 Capillary Blood Glucose(mg/dl): 134 Weight(lbs): 217 Pulse(bpm): Body Mass Index(BMI): 29 Blood Pressure(mmHg): Temperature(F): 98.1 Respiratory Rate(breaths/min): Photos: [1:No Photos Right, Medial Foot] [3:No Photos Left T Third oe] [N/A:N/A N/A] Wound Location: [1:Blister] [3:Gradually Appeared] [N/A:N/A] Wounding Event: [1:Diabetic Wound/Ulcer of the Lower] [3:Diabetic Wound/Ulcer of the Lower] [N/A:N/A] Primary Etiology: [1:Extremity Coronary Artery Disease,] [3:Extremity Coronary Artery Disease,] [N/A:N/A] Comorbid History: [1:Hypertension,  Peripheral Arterial Disease, Type II Diabetes, Neuropathy 02/18/2020] [3:Hypertension, Peripheral Arterial Disease, Type II Diabetes, Neuropathy 05/17/2020] [N/A:N/A] Date Acquired: [1:3] [3:2] [N/A:N/A] Weeks of Treatment: [1:Open] [3:Open] [N/A:N/A] Wound Status: [1:0.7x0.5x0.6] [3:0.7x1.5x0.3] [N/A:N/A] Measurements L x W x D (cm) [1:0.275] [3:0.825] [N/A:N/A] A (cm) : rea [1:0.165] [3:0.247] [N/A:N/A] Volume (cm) : [1:-192.60%] [3:-777.70%] [N/A:N/A] % Reduction in A rea: [1:-120.00%] [3:-2644.40%] [N/A:N/A] % Reduction in Volume: [1:12] Starting Position 1 (o'clock): [1:12] Ending Position 1 (o'clock): [1:1] Maximum Distance 1 (cm): [1:Yes] [3:No] [N/A:N/A] Undermining: [1:Grade 2] [3:Grade 2] [N/A:N/A] Classification: [1:Medium] [3:Medium] [N/A:N/A] Exudate A mount: [1:Serosanguineous] [3:Serosanguineous] [N/A:N/A] Exudate Type: [1:red, brown] [3:red, brown] [N/A:N/A] Exudate Color: [1:Well defined, not attached] [3:Flat and Intact] [N/A:N/A] Wound Margin: [1:Large (67-100%)] [3:Large (67-100%)] [N/A:N/A] Granulation A mount: [1:Red, Pink] [3:Pink] [N/A:N/A] Granulation Quality: [1:Small (1-33%)] [3:Small (1-33%)] [N/A:N/A] Necrotic A mount: [1:Fat Layer (Subcutaneous Tissue): Yes Fat Layer (Subcutaneous Tissue): Yes N/A] Exposed Structures: [1:Bone: Yes Fascia: No Tendon:  No Muscle: No Joint: No None] [3:Bone: Yes Fascia: No Tendon: No Muscle: No Joint: No Small (1-33%)] [N/A:N/A] Treatment Notes Wound #1 (Foot) Wound Laterality: Right, Medial Cleanser Wound Cleanser Discharge Instruction: Cleanse the wound with wound cleanser or normal saline prior to applying a clean dressing using gauze sponges, not tissue or cotton balls. Peri-Wound Care Topical Primary Dressing KerraCel Ag Gelling Fiber Dressing, 2x2 in (silver alginate) Discharge Instruction: lightly pack into tunneling/undermining Secondary Dressing Woven Gauze Sponges 2x2 in Discharge Instruction: Apply over primary dressing as directed. Optifoam Non-Adhesive Dressing, 4x4 in Discharge Instruction: Foam donut Secured With [employer] Sterile, 4.5x3.1 (in/yd) Discharge Instruction: Secure with Kerlix as directed. Paper Tape, 2x10 (in/yd) Discharge Instruction: Secure dressing with tape as directed. Compression Wrap Compression Stockings Add-Ons Wound #3 (Toe Third) Wound Laterality: Left Cleanser Wound Cleanser Discharge Instruction: Cleanse the wound with wound cleanser or normal saline prior to applying a clean dressing using gauze sponges, not tissue or cotton balls. Peri-Wound Care Topical Primary Dressing KerraCel Ag Gelling Fiber Dressing, 2x2 in (silver alginate) Discharge Instruction: Apply silver alginate to wound bed as instructed Secondary Dressing Woven Gauze Sponges 2x2 in Discharge Instruction: Apply over primary dressing as directed. Secured With [employer], 4.5x3.1 (in/yd) Discharge Instruction: Secure with Kerlix as directed. Paper Tape, 2x10 (in/yd) Discharge Instruction: Secure dressing with tape as directed. Compression Wrap Compression Stockings Add-Ons Electronic Signature(s) Signed: 06/03/2020 5:46:09 PM By: Linton Ham MD Signed: 06/03/2020 5:53:43 PM By: Deon Pilling Entered By: Linton Ham on 06/03/2020  15:16:35 -------------------------------------------------------------------------------- Multi-Disciplinary Care Plan Details Patient Name: Date of Service: Peter James 06/03/2020 1:45 PM Medical Record Number: [id number] Patient Account Number: [id number] Date of Birth/Sex: Treating RN: Mar 02, 1926 (87 y.o. [name] Primary Care Alexavier Tsutsui: [name] Other Clinician: Referring Keshawna Dix: Treating Julieta Rogalski/Extender: Greta Doom Weeks in Treatment: 3 Multidisciplinary Care Plan reviewed with physician Active Inactive Nutrition Nursing Diagnoses: Impaired glucose control: actual or potential Potential for alteratiion in Nutrition/Potential for imbalanced nutrition Goals: Patient/caregiver agrees to and verbalizes understanding of need to use nutritional supplements and/or vitamins as prescribed Date Initiated: 05/10/2020 Target Resolution Date: 06/11/2020 Goal Status: Active Patient/caregiver will maintain therapeutic glucose control Date Initiated: 05/10/2020 Target Resolution Date: 06/11/2020 Goal Status: Active Interventions: Assess HgA1c results as ordered upon admission and as needed Assess patient nutrition upon admission and as needed per policy Provide education on elevated blood sugars and impact on wound healing Provide education on nutrition Treatment Activities: Education provided on Nutrition : 05/10/2020 Notes: Wound/Skin Impairment Nursing Diagnoses: Impaired tissue integrity  Knowledge deficit related to ulceration/compromised skin integrity Goals: Patient/caregiver will verbalize understanding of skin care regimen Date Initiated: 05/10/2020 Target Resolution Date: 06/11/2020 Goal Status: Active Ulcer/skin breakdown will have a volume reduction of 30% by week 4 Date Initiated: 05/10/2020 Target Resolution Date: 06/11/2020 Goal Status: Active Interventions: Assess patient/caregiver ability to obtain necessary supplies Assess  patient/caregiver ability to perform ulcer/skin care regimen upon admission and as needed Assess ulceration(s) every visit Provide education on ulcer and skin care Notes: Electronic Signature(s) Signed: 06/03/2020 5:53:43 PM By: Deon Pilling Entered By: Deon Pilling on 06/03/2020 13:37:42 -------------------------------------------------------------------------------- Pain Assessment Details Patient Name: Date of Service: Gretta Arab 06/03/2020 1:45 PM Medical Record Number: [id number] Patient Account Number: [id number] Date of Birth/Sex: Treating RN: 03/04/1926 (87 y.o. Peter James Primary Care Nelline Lio: [name] Other Clinician: Referring Shatyra Becka: Treating Lynton Crescenzo/Extender: Greta Doom Weeks in Treatment: 3 Active Problems Location of Pain Severity and Description of Pain Patient Has Paino No Site Locations Pain Management and Medication Current Pain Management: Electronic Signature(s) Signed: 06/03/2020 5:54:24 PM By: Levan Hurst RN, BSN Entered By: Levan Hurst on 06/03/2020 13:27:57 -------------------------------------------------------------------------------- Patient/Caregiver Education Details Patient Name: Date of Service: Peter James 3/17/2022andnbsp1:45 PM Medical Record Number: [id number] Patient Account Number: [id number] Date of Birth/Gender: Treating RN: 04-16-1925 (87 y.o. [name] Primary Care Physician: [name] Other Clinician: Referring Physician: Treating Physician/Extender: Samul Dada in Treatment: 3 Education Assessment Education Provided To: Patient Education Topics Provided Tissue Oxygenation: Handouts: Peripheral Arterial Disease and Related Ulcers, Skin Perfusion Tests Methods: Explain/Verbal Responses: Reinforcements needed Electronic Signature(s) Signed: 06/03/2020 5:53:43 PM By: Deon Pilling Entered By: Deon Pilling on 06/03/2020  13:37:56 -------------------------------------------------------------------------------- Wound Assessment Details Patient Name: Date of Service: Gretta Arab 06/03/2020 1:45 PM Medical Record Number: [id number] Patient Account Number: [id number] Date of Birth/Sex: Treating RN: 05/19/1925 (87 y.o. Peter James Primary Care Sylvanus Telford: [name] Other Clinician: Referring Kerry Chisolm: Treating Glenmore Karl/Extender: Greta Doom Weeks in Treatment: 3 Wound Status Wound Number: 1 Primary Diabetic Wound/Ulcer of the Lower Extremity Etiology: Wound Location: Right, Medial Foot Wound Open Wounding Event: Blister Status: Date Acquired: 02/18/2020 Comorbid Coronary Artery Disease, Hypertension, Peripheral Arterial Weeks Of Treatment: 3 History: Disease, Type II Diabetes, Neuropathy Clustered Wound: No Photos Wound Measurements Length: (cm) 0.7 Width: (cm) 0.5 Depth: (cm) 0.6 Area: (cm) 0.275 Volume: (cm) 0.165 % Reduction in Area: -192.6% % Reduction in Volume: -120% Epithelialization: None Tunneling: No Undermining: Yes Starting Position (o'clock): 12 Ending Position (o'clock): 12 Maximum Distance: (cm) 1 Wound Description Classification: Grade 2 Wound Margin: Well defined, not attached Exudate Amount: Medium Exudate Type: Serosanguineous Exudate Color: red, brown Foul Odor After Cleansing: No Slough/Fibrino Yes Wound Bed Granulation Amount: Large (67-100%) Exposed Structure Granulation Quality: Red, Pink Fascia Exposed: No Necrotic Amount: Small (1-33%) Fat Layer (Subcutaneous Tissue) Exposed: Yes Necrotic Quality: Adherent Slough Tendon Exposed: No Muscle Exposed: No Joint Exposed: No Bone Exposed: Yes Treatment Notes Wound #1 (Foot) Wound Laterality: Right, Medial Cleanser Wound Cleanser Discharge Instruction: Cleanse the wound with wound cleanser or normal saline prior to applying a clean dressing using gauze sponges, not tissue or  cotton balls. Peri-Wound Care Topical Primary Dressing KerraCel Ag Gelling Fiber Dressing, 2x2 in (silver alginate) Discharge Instruction: lightly pack into tunneling/undermining Secondary Dressing Woven Gauze Sponges 2x2 in Discharge Instruction: Apply over primary dressing as directed. Optifoam Non-Adhesive Dressing, 4x4 in Discharge Instruction: Foam donut Secured With [employer] Sterile, 4.5x3.1 (in/yd) Discharge Instruction: Secure  with Kerlix as directed. Paper Tape, 2x10 (in/yd) Discharge Instruction: Secure dressing with tape as directed. Compression Wrap Compression Stockings Add-Ons Electronic Signature(s) Signed: 06/03/2020 5:41:28 PM By: Sandre Kitty Signed: 06/03/2020 5:54:24 PM By: Levan Hurst RN, BSN Entered By: Sandre Kitty on 06/03/2020 17:25:14 -------------------------------------------------------------------------------- Wound Assessment Details Patient Name: Date of Service: Peter James 06/03/2020 1:45 PM Medical Record Number: [id number] Patient Account Number: [id number] Date of Birth/Sex: Treating RN: 1925-12-31 (87 y.o. Peter James Primary Care Francine Hannan: [name] Other Clinician: Referring Ashar Lewinski: Treating Raechal Raben/Extender: Greta Doom Weeks in Treatment: 3 Wound Status Wound Number: 3 Primary Diabetic Wound/Ulcer of the Lower Extremity Etiology: Wound Location: Left T Third oe Wound Open Wounding Event: Gradually Appeared Status: Date Acquired: 05/17/2020 Comorbid Coronary Artery Disease, Hypertension, Peripheral Arterial Weeks Of Treatment: 2 History: Disease, Type II Diabetes, Neuropathy Clustered Wound: No Photos Wound Measurements Length: (cm) 0.7 Width: (cm) 1.5 Depth: (cm) 0.3 Area: (cm) 0.825 Volume: (cm) 0.247 % Reduction in Area: -777.7% % Reduction in Volume: -2644.4% Epithelialization: Small (1-33%) Tunneling: No Undermining: No Wound Description Classification: Grade  2 Wound Margin: Flat and Intact Exudate Amount: Medium Exudate Type: Serosanguineous Exudate Color: red, brown Foul Odor After Cleansing: No Slough/Fibrino No Wound Bed Granulation Amount: Large (67-100%) Exposed Structure Granulation Quality: Pink Fascia Exposed: No Necrotic Amount: Small (1-33%) Fat Layer (Subcutaneous Tissue) Exposed: Yes Necrotic Quality: Adherent Slough Tendon Exposed: No Muscle Exposed: No Joint Exposed: No Bone Exposed: Yes Treatment Notes Wound #3 (Toe Third) Wound Laterality: Left Cleanser Wound Cleanser Discharge Instruction: Cleanse the wound with wound cleanser or normal saline prior to applying a clean dressing using gauze sponges, not tissue or cotton balls. Peri-Wound Care Topical Primary Dressing KerraCel Ag Gelling Fiber Dressing, 2x2 in (silver alginate) Discharge Instruction: Apply silver alginate to wound bed as instructed Secondary Dressing Woven Gauze Sponges 2x2 in Discharge Instruction: Apply over primary dressing as directed. Secured With [employer], 4.5x3.1 (in/yd) Discharge Instruction: Secure with Kerlix as directed. Paper Tape, 2x10 (in/yd) Discharge Instruction: Secure dressing with tape as directed. Compression Wrap Compression Stockings Add-Ons Electronic Signature(s) Signed: 06/03/2020 5:41:28 PM By: Sandre Kitty Signed: 06/03/2020 5:54:24 PM By: Levan Hurst RN, BSN Entered By: Sandre Kitty on 06/03/2020 17:25:33 -------------------------------------------------------------------------------- Vitals Details Patient Name: Date of Service: Peter James 06/03/2020 1:45 PM Medical Record Number: [id number] Patient Account Number: [id number] Date of Birth/Sex: Treating RN: Jun 01, 1925 (87 y.o. Peter James Primary Care Laree Garron: [name] Other Clinician: Referring Toniann Dickerson: Treating Netanya Yazdani/Extender: Greta Doom Weeks in Treatment: 3 Vital Signs Time Taken:  13:27 Temperature (F): 98.1 Height (in): 73 Capillary Blood Glucose (mg/dl): 134 Weight (lbs): 217 Reference Range: 80 - 120 mg / dl Body Mass Index (BMI): 28.6 Notes glucose per pt report Electronic Signature(s) Signed: 06/03/2020 5:54:24 PM By: Levan Hurst RN, BSN Entered By: Levan Hurst on 06/03/2020 13:27:52

## 2020-06-04 ENCOUNTER — Ambulatory Visit: Payer: Non-veteran care | Admitting: Vascular Surgery

## 2020-06-04 ENCOUNTER — Encounter: Payer: Self-pay | Admitting: Vascular Surgery

## 2020-06-04 ENCOUNTER — Ambulatory Visit (INDEPENDENT_AMBULATORY_CARE_PROVIDER_SITE_OTHER): Payer: No Typology Code available for payment source | Admitting: Vascular Surgery

## 2020-06-04 ENCOUNTER — Other Ambulatory Visit (HOSPITAL_COMMUNITY): Payer: Non-veteran care

## 2020-06-04 ENCOUNTER — Other Ambulatory Visit: Payer: Self-pay

## 2020-06-04 ENCOUNTER — Inpatient Hospital Stay (HOSPITAL_COMMUNITY): Admission: RE | Admit: 2020-06-04 | Payer: Non-veteran care | Source: Ambulatory Visit

## 2020-06-04 VITALS — BP 129/74 | HR 80 | Temp 98.0°F | Resp 20 | Ht 73.0 in | Wt 194.0 lb

## 2020-06-04 DIAGNOSIS — I739 Peripheral vascular disease, unspecified: Secondary | ICD-10-CM

## 2020-06-04 DIAGNOSIS — E11621 Type 2 diabetes mellitus with foot ulcer: Secondary | ICD-10-CM | POA: Diagnosis not present

## 2020-06-04 DIAGNOSIS — Z4781 Encounter for orthopedic aftercare following surgical amputation: Secondary | ICD-10-CM | POA: Diagnosis not present

## 2020-06-04 DIAGNOSIS — L97512 Non-pressure chronic ulcer of other part of right foot with fat layer exposed: Secondary | ICD-10-CM | POA: Diagnosis not present

## 2020-06-04 DIAGNOSIS — I2510 Atherosclerotic heart disease of native coronary artery without angina pectoris: Secondary | ICD-10-CM | POA: Diagnosis not present

## 2020-06-04 DIAGNOSIS — E1151 Type 2 diabetes mellitus with diabetic peripheral angiopathy without gangrene: Secondary | ICD-10-CM | POA: Diagnosis not present

## 2020-06-04 DIAGNOSIS — E785 Hyperlipidemia, unspecified: Secondary | ICD-10-CM | POA: Diagnosis not present

## 2020-06-04 DIAGNOSIS — I5030 Unspecified diastolic (congestive) heart failure: Secondary | ICD-10-CM | POA: Diagnosis not present

## 2020-06-04 DIAGNOSIS — E1140 Type 2 diabetes mellitus with diabetic neuropathy, unspecified: Secondary | ICD-10-CM | POA: Diagnosis not present

## 2020-06-04 DIAGNOSIS — I110 Hypertensive heart disease with heart failure: Secondary | ICD-10-CM | POA: Diagnosis not present

## 2020-06-04 NOTE — H&P (View-Only) (Signed)
Patient ID: Peter Furlong., male   DOB: 11-26-1925, 87 y.o.   MRN: [id number]  Reason for Consult: Follow-up   Referred by [name], MD  Subjective:     HPI:  Peter Franek. is a 87 y.o. male history of right popliteal artery stent and balloon angioplasty of right anterior tibial artery for wound performed in November of last year followed by balloon angioplasty of his left peroneal artery in December for wounds and then amputation of his left second toe. Has persistent wound right foot with drainage and concern for osteo. also has wound on left 3rd toe. He is on aspirin.  Past Medical History:  Diagnosis Date  . Acute lower GI bleeding    Recurrent: 2011, 2012  . Bladder cancer ([place])    Transitional cell, 2001  . Bradycardia 01/20/2020  . CAD (coronary atherosclerotic disease)    Nonobstructive, 05/2012  . Exertional dyspnea    EF 61-44%, grade 1 diastolic dysfunction, 05/1538  . HLD (hyperlipidemia)   . HTN (hypertension)   . Neuropathy   . Other insomnia   . PAD (peripheral artery disease) ([place])    ABIs, 2009: 0.89 right; 0.99 left  . Type 2 diabetes mellitus ([place])   . Valvular heart disease   . Vertigo    Family History  Problem Relation Age of Onset  . Heart attack Mother   . Heart attack Father   . Heart attack Sister   . Heart attack Sister   . Heart attack Sister   . Heart attack Sister    Past Surgical History:  Procedure Laterality Date  . ABDOMINAL AORTOGRAM W/LOWER EXTREMITY N/A 01/19/2020   Procedure: ABDOMINAL AORTOGRAM W/ Bilateral LOWER EXTREMITY Runoff;  Surgeon: Waynetta Sandy, MD;  Location: [facility];  Service: Cardiovascular;  Laterality: N/A;  . AMPUTATION Left 10/01/2019   Procedure: LEFT GREAT TOE AMPUTATION AT METATARSOPHALANGEAL JOINT;  Surgeon: [name], MD;  Location: [facility];  Service: Orthopedics;  Laterality: Left;  . AMPUTATION Left 02/24/2020   Procedure: LEFT SECOND TOE AMPUTATION;  Surgeon: Waynetta Sandy, MD;  Location: [facility];  Service: Vascular;  Laterality: Left;  . BIV PACEMAKER INSERTION CRT-P N/A 02/11/2020   Procedure: BIV PACEMAKER INSERTION CRT-P;  Surgeon: Thompson Grayer, MD;  Location: [facility];  Service: Cardiovascular;  Laterality: N/A;  . CATARACT EXTRACTION     bi lateral  . COLONOSCOPY    . HEMORRHOID SURGERY    . INGUINAL HERNIA REPAIR    . LOWER EXTREMITY ANGIOGRAPHY  02/23/2020   Procedure: Lower Extremity Angiography;  Surgeon: Waynetta Sandy, MD;  Location: [facility];  Service: Cardiovascular;;  . PERIPHERAL VASCULAR ATHERECTOMY Left 02/23/2020   Procedure: PERIPHERAL VASCULAR ATHERECTOMY;  Surgeon: Waynetta Sandy, MD;  Location: [facility];  Service: Cardiovascular;  Laterality: Left;  . PERIPHERAL VASCULAR BALLOON ANGIOPLASTY  02/23/2020   Procedure: PERIPHERAL VASCULAR BALLOON ANGIOPLASTY;  Surgeon: Waynetta Sandy, MD;  Location: [facility];  Service: Cardiovascular;;  left Peroneal   . PERIPHERAL VASCULAR INTERVENTION Right 01/19/2020   Procedure: PERIPHERAL VASCULAR INTERVENTION;  Surgeon: Waynetta Sandy, MD;  Location: [facility];  Service: Cardiovascular;  Laterality: Right;    Short Social History:  Social History   Tobacco Use  . Smoking status: Former Smoker    Quit date: 04/19/1972    Years since quitting: 48.1  . Smokeless tobacco: Never Used  Substance Use Topics  .  Alcohol use: Never    No Known Allergies  Current Outpatient Medications  Medication Sig Dispense Refill  . albuterol (VENTOLIN HFA) 108 (90 Base) MCG/ACT inhaler Inhale 2 puffs into the lungs every 6 (six) hours as needed for wheezing or shortness of breath.    . allopurinol (ZYLOPRIM) 300 MG tablet Take 300 mg by mouth daily.     . Alogliptin Benzoate 12.5 MG TABS Take 6.25 mg by mouth daily.    [name] amLODipine (NORVASC) 10 MG tablet Take 1 tablet (10 mg total) by mouth daily. 90 tablet  3  . aspirin EC 81 MG tablet Take 81 mg by mouth daily. Swallow whole.    [name] atorvastatin (LIPITOR) 10 MG tablet Take 1 tablet (10 mg total) by mouth every evening. 30 tablet 6  . carvedilol (COREG) 3.125 MG tablet Take 3.125 mg by mouth 2 (two) times daily with a meal.    . cholecalciferol (VITAMIN D3) 25 MCG (1000 UNIT) tablet Take 1,000 Units by mouth daily.    . clopidogrel (PLAVIX) 75 MG tablet Take 1 tablet (75 mg total) by mouth daily. 30 tablet 3  . clotrimazole (LOTRIMIN) 1 % cream Apply 1 application topically once a week.    . nortriptyline (PAMELOR) 10 MG capsule Take 10 mg by mouth at bedtime.     . polyethylene glycol (MIRALAX / GLYCOLAX) 17 g packet Take 17 g by mouth daily as needed for mild constipation.    . sildenafil (VIAGRA) 100 MG tablet Take 100 mg by mouth daily as needed for erectile dysfunction.    . sulfamethoxazole-trimethoprim (BACTRIM) 400-80 MG tablet Take 1 tablet by mouth 2 (two) times daily. 28 tablet 0  . tamsulosin (FLOMAX) 0.4 MG CAPS capsule Take 0.4 mg by mouth daily.     . traMADol (ULTRAM) 50 MG tablet Take 1 tablet (50 mg total) by mouth every 6 (six) hours as needed. 8 tablet 0   No current facility-administered medications for this visit.    Review of Systems  Constitutional:  Constitutional negative. HENT: HENT negative.  Eyes: Eyes negative.  Respiratory: Respiratory negative.  Cardiovascular: Cardiovascular negative.  GI: Gastrointestinal negative.  Skin: Positive for wound.  Neurological: Neurological negative. Hematologic: Hematologic/lymphatic negative.  Psychiatric: Psychiatric negative.        Objective:  Objective   Vitals:   06/04/20 1425  BP: 129/74  Pulse: 80  Resp: 20  Temp: 98 F (36.7 C)  SpO2: 97%    Physical Exam HENT:     Head: Normocephalic.     Nose:     Comments: Wearing a mask Eyes:     Pupils: Pupils are equal, round, and reactive to light.  Cardiovascular:     Rate and Rhythm: Normal rate.      Comments: Monophasic dorsalis pedis on the right, left peroneal appears multiphasic Pulmonary:     Effort: Pulmonary effort is normal.  Abdominal:     General: Abdomen is flat.     Palpations: Abdomen is soft.  Musculoskeletal:        General: No swelling. Normal range of motion.  Skin:    Capillary Refill: Capillary refill takes less than 2 seconds.     Comments: Right 1st metatarsal wound with drainage Left 3rd toe amp without drainage.  Neurological:     General: No focal deficit present.     Mental Status: He is alert.  Psychiatric:        Mood and Affect: Mood normal.  Behavior: Behavior normal.        Thought Content: Thought content normal.        Judgment: Judgment normal.     Data: +-----------+--------+-----+---------------+----------+--------+  RIGHT   PSV cm/sRatioStenosis    Waveform Comments  +-----------+--------+-----+---------------+----------+--------+  CFA Prox  151              triphasic       +-----------+--------+-----+---------------+----------+--------+  CFA Distal 312      50-74% stenosistriphasic       +-----------+--------+-----+---------------+----------+--------+  DFA    83              biphasic       +-----------+--------+-----+---------------+----------+--------+  SFA Prox  94              biphasic       +-----------+--------+-----+---------------+----------+--------+  SFA Mid  118              triphasic       +-----------+--------+-----+---------------+----------+--------+  SFA Distal 122              triphasic       +-----------+--------+-----+---------------+----------+--------+  ATA Distal 82              monophasic      +-----------+--------+-----+---------------+----------+--------+  PTA Distal 0       occluded    absent         +-----------+--------+-----+---------------+----------+--------+  PERO Distal58              monophasic      +-----------+--------+-----+---------------+----------+--------+      Right Stent(s):  +----------------+--------+--------+---------+--------+  popliteal arteryPSV cm/sStenosisWaveform Comments  +----------------+--------+--------+---------+--------+  Prox to Stent  41       triphasic      +----------------+--------+--------+---------+--------+  Proximal Stent 58       triphasic      +----------------+--------+--------+---------+--------+  Mid Stent    88       triphasic      +----------------+--------+--------+---------+--------+  Distal Stent  81       triphasic      +----------------+--------+--------+---------+--------+  Distal to Stent 101       triphasic      +----------------+--------+--------+---------+--------+     +----------+--------+-----+--------+----------+--------+  LEFT   PSV cm/sRatioStenosisWaveform Comments  +----------+--------+-----+--------+----------+--------+  CFA Prox 127          biphasic       +----------+--------+-----+--------+----------+--------+  CFA Distal198          biphasic       +----------+--------+-----+--------+----------+--------+  DFA    69          biphasic       +----------+--------+-----+--------+----------+--------+  SFA Prox 140          biphasic       +----------+--------+-----+--------+----------+--------+  SFA Mid  156          biphasic       +----------+--------+-----+--------+----------+--------+  SFA Distal153          biphasic       +----------+--------+-----+--------+----------+--------+  POP Prox 55          biphasic        +----------+--------+-----+--------+----------+--------+  POP Distal75          biphasic       +----------+--------+-----+--------+----------+--------+  ATA Distal60          monophasic      +----------+--------+-----+--------+----------+--------+  PTA Distal0       occludedabsent        +----------+--------+-----+--------+----------+--------+     Summary:  Right: 50-74% stenosis  noted in the common femoral artery. Total occlusion  noted in the posterior tibial artery. Patent stent with no evidence of  stenosis in the Right popliteal artery. Doppler waveforms suggest  occlusive disease at the tibial level.   Left: Total occlusion noted in the posterior tibial artery. Doppler  waveforms suggest occlusive disease at the tibial level.   ABI Findings:  +---------+------------------+-----+----------+--------+  Right  Rt Pressure (mmHg)IndexWaveform Comment   +---------+------------------+-----+----------+--------+  Brachial 154                      +---------+------------------+-----+----------+--------+  ATA   104        0.67            +---------+------------------+-----+----------+--------+  PTA   107        0.69 biphasic       +---------+------------------+-----+----------+--------+  DP                monophasic      +---------+------------------+-----+----------+--------+  Great Toe54        0.35            +---------+------------------+-----+----------+--------+   +---------+------------------+-----+----------+----------+  Left   Lt Pressure (mmHg)IndexWaveform Comment    +---------+------------------+-----+----------+----------+  Brachial 155                       +---------+------------------+-----+----------+----------+  ATA   132        0.85              +---------+------------------+-----+----------+----------+  PTA   111        0.72 monophasic       +---------+------------------+-----+----------+----------+  DP                monophasic       +---------+------------------+-----+----------+----------+  Great Toe                 amputation  +---------+------------------+-----+----------+----------+        Assessment/Plan:    87yo male with above noted procedures previous right popliteal and AT intervention and previous left peroneal intervention.  He does have an ulcer on the left third toe this does not appear to have osteo by recent x-ray does possibly have osteo of the right first metatarsal by CT.  Plan is for angiogram from left common femoral approach to evaluate the right lower extremity likely with CO2.  We will possibly need invention of his tibial vessels.  At that time he can have debridement of the foot if necessary by the wound care center.     Waynetta Sandy MD [facility]

## 2020-06-04 NOTE — Progress Notes (Signed)
Patient ID: Peter James., male   DOB: 01/26/1926, 87 y.o.   MRN: [id number]  Reason for Consult: Follow-up   Referred by [name], MD  Subjective:     HPI:  Peter James. is a 87 y.o. male history of right popliteal artery stent and balloon angioplasty of right anterior tibial artery for wound performed in November of last year followed by balloon angioplasty of his left peroneal artery in December for wounds and then amputation of his left second toe. Has persistent wound right foot with drainage and concern for osteo. also has wound on left 3rd toe. He is on aspirin.  Past Medical History:  Diagnosis Date  . Acute lower GI bleeding    Recurrent: 2011, 2012  . Bladder cancer ([place])    Transitional cell, 2001  . Bradycardia 01/20/2020  . CAD (coronary atherosclerotic disease)    Nonobstructive, 05/2012  . Exertional dyspnea    EF 37-16%, grade 1 diastolic dysfunction, 11/6787  . HLD (hyperlipidemia)   . HTN (hypertension)   . Neuropathy   . Other insomnia   . PAD (peripheral artery disease) ([place])    ABIs, 2009: 0.89 right; 0.99 left  . Type 2 diabetes mellitus ([place])   . Valvular heart disease   . Vertigo    Family History  Problem Relation Age of Onset  . Heart attack Mother   . Heart attack Father   . Heart attack Sister   . Heart attack Sister   . Heart attack Sister   . Heart attack Sister    Past Surgical History:  Procedure Laterality Date  . ABDOMINAL AORTOGRAM W/LOWER EXTREMITY N/A 01/19/2020   Procedure: ABDOMINAL AORTOGRAM W/ Bilateral LOWER EXTREMITY Runoff;  Surgeon: Waynetta Sandy, MD;  Location: [facility];  Service: Cardiovascular;  Laterality: N/A;  . AMPUTATION Left 10/01/2019   Procedure: LEFT GREAT TOE AMPUTATION AT METATARSOPHALANGEAL JOINT;  Surgeon: [name], MD;  Location: [facility];  Service: Orthopedics;  Laterality: Left;  . AMPUTATION Left 02/24/2020   Procedure: LEFT SECOND TOE AMPUTATION;  Surgeon: Waynetta Sandy, MD;  Location: [facility];  Service: Vascular;  Laterality: Left;  . BIV PACEMAKER INSERTION CRT-P N/A 02/11/2020   Procedure: BIV PACEMAKER INSERTION CRT-P;  Surgeon: Thompson Grayer, MD;  Location: [facility];  Service: Cardiovascular;  Laterality: N/A;  . CATARACT EXTRACTION     bi lateral  . COLONOSCOPY    . HEMORRHOID SURGERY    . INGUINAL HERNIA REPAIR    . LOWER EXTREMITY ANGIOGRAPHY  02/23/2020   Procedure: Lower Extremity Angiography;  Surgeon: Waynetta Sandy, MD;  Location: [facility];  Service: Cardiovascular;;  . PERIPHERAL VASCULAR ATHERECTOMY Left 02/23/2020   Procedure: PERIPHERAL VASCULAR ATHERECTOMY;  Surgeon: Waynetta Sandy, MD;  Location: [facility];  Service: Cardiovascular;  Laterality: Left;  . PERIPHERAL VASCULAR BALLOON ANGIOPLASTY  02/23/2020   Procedure: PERIPHERAL VASCULAR BALLOON ANGIOPLASTY;  Surgeon: Waynetta Sandy, MD;  Location: [facility];  Service: Cardiovascular;;  left Peroneal   . PERIPHERAL VASCULAR INTERVENTION Right 01/19/2020   Procedure: PERIPHERAL VASCULAR INTERVENTION;  Surgeon: Waynetta Sandy, MD;  Location: [facility];  Service: Cardiovascular;  Laterality: Right;    Short Social History:  Social History   Tobacco Use  . Smoking status: Former Smoker    Quit date: 04/19/1972    Years since quitting: 48.1  . Smokeless tobacco: Never Used  Substance Use Topics  .  Alcohol use: Never    No Known Allergies  Current Outpatient Medications  Medication Sig Dispense Refill  . albuterol (VENTOLIN HFA) 108 (90 Base) MCG/ACT inhaler Inhale 2 puffs into the lungs every 6 (six) hours as needed for wheezing or shortness of breath.    . allopurinol (ZYLOPRIM) 300 MG tablet Take 300 mg by mouth daily.     . Alogliptin Benzoate 12.5 MG TABS Take 6.25 mg by mouth daily.    [name] amLODipine (NORVASC) 10 MG tablet Take 1 tablet (10 mg total) by mouth daily. 90 tablet  3  . aspirin EC 81 MG tablet Take 81 mg by mouth daily. Swallow whole.    [name] atorvastatin (LIPITOR) 10 MG tablet Take 1 tablet (10 mg total) by mouth every evening. 30 tablet 6  . carvedilol (COREG) 3.125 MG tablet Take 3.125 mg by mouth 2 (two) times daily with a meal.    . cholecalciferol (VITAMIN D3) 25 MCG (1000 UNIT) tablet Take 1,000 Units by mouth daily.    . clopidogrel (PLAVIX) 75 MG tablet Take 1 tablet (75 mg total) by mouth daily. 30 tablet 3  . clotrimazole (LOTRIMIN) 1 % cream Apply 1 application topically once a week.    . nortriptyline (PAMELOR) 10 MG capsule Take 10 mg by mouth at bedtime.     . polyethylene glycol (MIRALAX / GLYCOLAX) 17 g packet Take 17 g by mouth daily as needed for mild constipation.    . sildenafil (VIAGRA) 100 MG tablet Take 100 mg by mouth daily as needed for erectile dysfunction.    . sulfamethoxazole-trimethoprim (BACTRIM) 400-80 MG tablet Take 1 tablet by mouth 2 (two) times daily. 28 tablet 0  . tamsulosin (FLOMAX) 0.4 MG CAPS capsule Take 0.4 mg by mouth daily.     . traMADol (ULTRAM) 50 MG tablet Take 1 tablet (50 mg total) by mouth every 6 (six) hours as needed. 8 tablet 0   No current facility-administered medications for this visit.    Review of Systems  Constitutional:  Constitutional negative. HENT: HENT negative.  Eyes: Eyes negative.  Respiratory: Respiratory negative.  Cardiovascular: Cardiovascular negative.  GI: Gastrointestinal negative.  Skin: Positive for wound.  Neurological: Neurological negative. Hematologic: Hematologic/lymphatic negative.  Psychiatric: Psychiatric negative.        Objective:  Objective   Vitals:   06/04/20 1425  BP: 129/74  Pulse: 80  Resp: 20  Temp: 98 F (36.7 C)  SpO2: 97%    Physical Exam HENT:     Head: Normocephalic.     Nose:     Comments: Wearing a mask Eyes:     Pupils: Pupils are equal, round, and reactive to light.  Cardiovascular:     Rate and Rhythm: Normal rate.      Comments: Monophasic dorsalis pedis on the right, left peroneal appears multiphasic Pulmonary:     Effort: Pulmonary effort is normal.  Abdominal:     General: Abdomen is flat.     Palpations: Abdomen is soft.  Musculoskeletal:        General: No swelling. Normal range of motion.  Skin:    Capillary Refill: Capillary refill takes less than 2 seconds.     Comments: Right 1st metatarsal wound with drainage Left 3rd toe amp without drainage.  Neurological:     General: No focal deficit present.     Mental Status: He is alert.  Psychiatric:        Mood and Affect: Mood normal.  Behavior: Behavior normal.        Thought Content: Thought content normal.        Judgment: Judgment normal.     Data: +-----------+--------+-----+---------------+----------+--------+  RIGHT   PSV cm/sRatioStenosis    Waveform Comments  +-----------+--------+-----+---------------+----------+--------+  CFA Prox  151              triphasic       +-----------+--------+-----+---------------+----------+--------+  CFA Distal 312      50-74% stenosistriphasic       +-----------+--------+-----+---------------+----------+--------+  DFA    83              biphasic       +-----------+--------+-----+---------------+----------+--------+  SFA Prox  94              biphasic       +-----------+--------+-----+---------------+----------+--------+  SFA Mid  118              triphasic       +-----------+--------+-----+---------------+----------+--------+  SFA Distal 122              triphasic       +-----------+--------+-----+---------------+----------+--------+  ATA Distal 82              monophasic      +-----------+--------+-----+---------------+----------+--------+  PTA Distal 0       occluded    absent         +-----------+--------+-----+---------------+----------+--------+  PERO Distal58              monophasic      +-----------+--------+-----+---------------+----------+--------+      Right Stent(s):  +----------------+--------+--------+---------+--------+  popliteal arteryPSV cm/sStenosisWaveform Comments  +----------------+--------+--------+---------+--------+  Prox to Stent  41       triphasic      +----------------+--------+--------+---------+--------+  Proximal Stent 58       triphasic      +----------------+--------+--------+---------+--------+  Mid Stent    88       triphasic      +----------------+--------+--------+---------+--------+  Distal Stent  81       triphasic      +----------------+--------+--------+---------+--------+  Distal to Stent 101       triphasic      +----------------+--------+--------+---------+--------+     +----------+--------+-----+--------+----------+--------+  LEFT   PSV cm/sRatioStenosisWaveform Comments  +----------+--------+-----+--------+----------+--------+  CFA Prox 127          biphasic       +----------+--------+-----+--------+----------+--------+  CFA Distal198          biphasic       +----------+--------+-----+--------+----------+--------+  DFA    69          biphasic       +----------+--------+-----+--------+----------+--------+  SFA Prox 140          biphasic       +----------+--------+-----+--------+----------+--------+  SFA Mid  156          biphasic       +----------+--------+-----+--------+----------+--------+  SFA Distal153          biphasic       +----------+--------+-----+--------+----------+--------+  POP Prox 55          biphasic        +----------+--------+-----+--------+----------+--------+  POP Distal75          biphasic       +----------+--------+-----+--------+----------+--------+  ATA Distal60          monophasic      +----------+--------+-----+--------+----------+--------+  PTA Distal0       occludedabsent        +----------+--------+-----+--------+----------+--------+     Summary:  Right: 50-74% stenosis  noted in the common femoral artery. Total occlusion  noted in the posterior tibial artery. Patent stent with no evidence of  stenosis in the Right popliteal artery. Doppler waveforms suggest  occlusive disease at the tibial level.   Left: Total occlusion noted in the posterior tibial artery. Doppler  waveforms suggest occlusive disease at the tibial level.   ABI Findings:  +---------+------------------+-----+----------+--------+  Right  Rt Pressure (mmHg)IndexWaveform Comment   +---------+------------------+-----+----------+--------+  Brachial 154                      +---------+------------------+-----+----------+--------+  ATA   104        0.67            +---------+------------------+-----+----------+--------+  PTA   107        0.69 biphasic       +---------+------------------+-----+----------+--------+  DP                monophasic      +---------+------------------+-----+----------+--------+  Great Toe54        0.35            +---------+------------------+-----+----------+--------+   +---------+------------------+-----+----------+----------+  Left   Lt Pressure (mmHg)IndexWaveform Comment    +---------+------------------+-----+----------+----------+  Brachial 155                       +---------+------------------+-----+----------+----------+  ATA   132        0.85              +---------+------------------+-----+----------+----------+  PTA   111        0.72 monophasic       +---------+------------------+-----+----------+----------+  DP                monophasic       +---------+------------------+-----+----------+----------+  Great Toe                 amputation  +---------+------------------+-----+----------+----------+        Assessment/Plan:    87yo male with above noted procedures previous right popliteal and AT intervention and previous left peroneal intervention.  He does have an ulcer on the left third toe this does not appear to have osteo by recent x-ray does possibly have osteo of the right first metatarsal by CT.  Plan is for angiogram from left common femoral approach to evaluate the right lower extremity likely with CO2.  We will possibly need invention of his tibial vessels.  At that time he can have debridement of the foot if necessary by the wound care center.     Waynetta Sandy MD [facility]

## 2020-06-07 ENCOUNTER — Other Ambulatory Visit: Payer: Self-pay

## 2020-06-07 ENCOUNTER — Encounter (HOSPITAL_BASED_OUTPATIENT_CLINIC_OR_DEPARTMENT_OTHER): Payer: Medicare HMO | Admitting: Internal Medicine

## 2020-06-07 DIAGNOSIS — Z89412 Acquired absence of left great toe: Secondary | ICD-10-CM | POA: Diagnosis not present

## 2020-06-07 DIAGNOSIS — L97518 Non-pressure chronic ulcer of other part of right foot with other specified severity: Secondary | ICD-10-CM | POA: Diagnosis not present

## 2020-06-07 DIAGNOSIS — L97514 Non-pressure chronic ulcer of other part of right foot with necrosis of bone: Secondary | ICD-10-CM | POA: Diagnosis not present

## 2020-06-07 DIAGNOSIS — J449 Chronic obstructive pulmonary disease, unspecified: Secondary | ICD-10-CM | POA: Diagnosis not present

## 2020-06-07 DIAGNOSIS — I10 Essential (primary) hypertension: Secondary | ICD-10-CM | POA: Diagnosis not present

## 2020-06-07 DIAGNOSIS — L97512 Non-pressure chronic ulcer of other part of right foot with fat layer exposed: Secondary | ICD-10-CM | POA: Diagnosis not present

## 2020-06-07 DIAGNOSIS — E11621 Type 2 diabetes mellitus with foot ulcer: Secondary | ICD-10-CM | POA: Diagnosis not present

## 2020-06-07 DIAGNOSIS — Z950 Presence of cardiac pacemaker: Secondary | ICD-10-CM | POA: Diagnosis not present

## 2020-06-07 DIAGNOSIS — L97522 Non-pressure chronic ulcer of other part of left foot with fat layer exposed: Secondary | ICD-10-CM | POA: Diagnosis not present

## 2020-06-07 DIAGNOSIS — E1151 Type 2 diabetes mellitus with diabetic peripheral angiopathy without gangrene: Secondary | ICD-10-CM | POA: Diagnosis not present

## 2020-06-07 DIAGNOSIS — S80211A Abrasion, right knee, initial encounter: Secondary | ICD-10-CM | POA: Diagnosis not present

## 2020-06-07 NOTE — Progress Notes (Signed)
OREST, DYGERT ([id number]) Visit Report for 06/07/2020 Arrival Information Details Patient Name: Date of Service: Peter James 06/07/2020 3:30 PM Medical Record Number: [id number] Patient Account Number: [id number] Date of Birth/Sex: Treating RN: Sep 27, 1925 (87 y.o. [name] Primary Care Kianni Lheureux: [name] Other Clinician: Referring Vishaal Strollo: Treating Horace Lukas/Extender: Greta Doom Weeks in Treatment: 4 Visit Information History Since Last Visit Added or deleted any medications: No Patient Arrived: Ambulatory Any new allergies or adverse reactions: No Arrival Time: 15:06 Had a fall or experienced change in No Accompanied By: son activities of daily living that may affect Transfer Assistance: None risk of falls: Patient Identification Verified: Yes Signs or symptoms of abuse/neglect since last visito No Secondary Verification Process Completed: Yes Hospitalized since last visit: No Patient Requires Transmission-Based Precautions: No Implantable device outside of the clinic excluding No Patient Has Alerts: Yes cellular tissue based products placed in the center Patient Alerts: ABI's 12/21: R:0.75 L0.77 since last visit: Has Dressing in Place as Prescribed: Yes Pain Present Now: No Electronic Signature(s) Signed: 06/07/2020 4:46:01 PM By: Sandre Kitty Entered By: Sandre Kitty on 06/07/2020 15:07:04 -------------------------------------------------------------------------------- Clinic Level of Care Assessment Details Patient Name: Date of Service: Peter James 06/07/2020 3:30 PM Medical Record Number: [id number] Patient Account Number: [id number] Date of Birth/Sex: Treating RN: [birth year] (87 y.o. [name] Primary Care Cherryl Babin: [name] Other Clinician: Referring Reign Bartnick: Treating Haeven Nickle/Extender: Greta Doom Weeks in Treatment: 4 Clinic Level of Care Assessment Items TOOL 4 Quantity Score X- 1  0 Use when only an EandM is performed on FOLLOW-UP visit ASSESSMENTS - Nursing Assessment / Reassessment X- 1 10 Reassessment of Co-morbidities (includes updates in patient status) X- 1 5 Reassessment of Adherence to Treatment Plan ASSESSMENTS - Wound and Skin A ssessment / Reassessment _0  - 0 Simple Wound Assessment / Reassessment - one wound X- 2 5 Complex Wound Assessment / Reassessment - multiple wounds _1  - 0 Dermatologic / Skin Assessment (not related to wound area) ASSESSMENTS - Focused Assessment _2  - 0 Circumferential Edema Measurements - multi extremities _3  - 0 Nutritional Assessment / Counseling / Intervention X- 1 5 Lower Extremity Assessment (monofilament, tuning fork, pulses) _4  - 0 Peripheral Arterial Disease Assessment (using hand held doppler) ASSESSMENTS - Ostomy and/or Continence Assessment and Care _5  - 0 Incontinence Assessment and Management _6  - 0 Ostomy Care Assessment and Management (repouching, etc.) PROCESS - Coordination of Care X - Simple Patient / Family Education for ongoing care 1 15 _7  - 0 Complex (extensive) Patient / Family Education for ongoing care X- 1 10 Staff obtains Programmer, systems, Records, T Results / Process Orders est X- 1 10 Staff telephones HHA, Nursing Homes / Clarify orders / etc _8  - 0 Routine Transfer to another Facility (non-emergent condition) _9  - 0 Routine Hospital Admission (non-emergent condition) _10  - 0 New Admissions / Biomedical engineer / Ordering NPWT Apligraf, etc. , _11  - 0 Emergency Hospital Admission (emergent condition) X- 1 10 Simple Discharge Coordination _12  - 0 Complex (extensive) Discharge Coordination PROCESS - Special Needs _13  - 0 Pediatric / Minor Patient Management _14  - 0 Isolation Patient Management _15  - 0 Hearing / Language / Visual special needs _16  - 0 Assessment of Community assistance (transportation, D/C planning, etc.) _17  - 0 Additional assistance / Altered mentation _18  -  0 Support Surface(s) Assessment (bed, cushion, seat, etc.) INTERVENTIONS - Wound Cleansing / Measurement _19  - 0 Simple Wound Cleansing - one wound X- 2 5 Complex Wound Cleansing -  multiple wounds X- 1 5 Wound Imaging (photographs - any number of wounds) _0  - 0 Wound Tracing (instead of photographs) _1  - 0 Simple Wound Measurement - one wound X- 2 5 Complex Wound Measurement - multiple wounds INTERVENTIONS - Wound Dressings X - Small Wound Dressing one or multiple wounds 2 10 _2  - 0 Medium Wound Dressing one or multiple wounds _3  - 0 Large Wound Dressing one or multiple wounds X- 1 5 Application of Medications - topical <BULAGTXMIWOEHOZY>_2<\/QMGNOIBBCWUGQBVQ>_9  - 0 Application of Medications - injection INTERVENTIONS - Miscellaneous _5  - 0 External ear exam _6  - 0 Specimen Collection (cultures, biopsies, blood, body fluids, etc.) _7  - 0 Specimen(s) / Culture(s) sent or taken to Lab for analysis _8  - 0 Patient Transfer (multiple staff / Civil Service fast streamer / Similar devices) _9  - 0 Simple Staple / Suture removal (25 or less) _10  - 0 Complex Staple / Suture removal (26 or more) _11  - 0 Hypo / Hyperglycemic Management (close monitor of Blood Glucose) _12  - 0 Ankle / Brachial Index (ABI) - do not check if billed separately X- 1 5 Vital Signs Has the patient been seen at the hospital within the last three years: Yes Total Score: 130 Level Of Care: New/Established - Level 4 Electronic Signature(s) Signed: 06/07/2020 5:12:11 PM By: Levan Hurst RN, BSN Entered By: Levan Hurst on 06/07/2020 16:49:43 -------------------------------------------------------------------------------- Encounter Discharge Information Details Patient Name: Date of Service: [name] 06/07/2020 3:30 PM Medical Record Number: [id number] Patient Account Number: [id number] Date of Birth/Sex: Treating RN: [birth year] (87 y.o. [name] Primary Care Miasia Crabtree: [name] Other Clinician: Referring Kurt Hoffmeier: Treating  Gildardo Tickner/Extender: Greta Doom Weeks in Treatment: 4 Encounter Discharge Information Items Discharge Condition: Stable Ambulatory Status: Cane Discharge Destination: Home Transportation: Private Auto Accompanied By: son in law Schedule Follow-up Appointment: Yes Clinical Summary of Care: Electronic Signature(s) Signed: 06/07/2020 4:58:53 PM By: Deon Pilling Entered By: Deon Pilling on 06/07/2020 16:20:39 -------------------------------------------------------------------------------- Lower Extremity Assessment Details Patient Name: Date of Service: Peter James 06/07/2020 3:30 PM Medical Record Number: [id number] Patient Account Number: [id number] Date of Birth/Sex: Treating RN: 1925/06/02 (87 y.o. [name] Primary Care Delberta Folts: [name] Other Clinician: Referring Kaena Santori: Treating Pria Klosinski/Extender: Greta Doom Weeks in Treatment: 4 Edema Assessment Assessed: Shirlyn Goltz: No] [Right: No] Edema: [Left: No] [Right: No] Calf Left: Right: Point of Measurement: 42 cm From Medial Instep 38.2 cm 37 cm Ankle Left: Right: Point of Measurement: 23 cm From Medial Instep 23.5 cm 22.8 cm Vascular Assessment Pulses: Dorsalis Pedis Palpable: [Left:Yes] [Right:Yes] Electronic Signature(s) Signed: 06/07/2020 5:12:11 PM By: Levan Hurst RN, BSN Entered By: Levan Hurst on 06/07/2020 15:26:12 -------------------------------------------------------------------------------- Multi Wound Chart Details Patient Name: Date of Service: [name] 06/07/2020 3:30 PM Medical Record Number: [id number] Patient Account Number: [id number] Date of Birth/Sex: Treating RN: Aug 27, 1925 (87 y.o. [name] Primary Care Beya Tipps: [name] Other Clinician: Referring Shamaya Kauer: Treating Keyden Pavlov/Extender: Greta Doom Weeks in Treatment: 4 Vital Signs Height(in): 73 Capillary Blood Glucose(mg/dl): 119 Weight(lbs):  217 Pulse(bpm): 63 Body Mass Index(BMI): 31 Blood Pressure(mmHg): 136/72 Temperature(F): 97.5 Respiratory Rate(breaths/min): 18 Photos: [1:No Photos Right, Medial Foot] [3:No 65 Left T Third oe] [N/A:N/A N/A] Wound Location: [1:Blister] [3:Gradually Appeared] [N/A:N/A] Wounding Event: [1:Diabetic Wound/Ulcer of the Lower] [3:Diabetic Wound/Ulcer of the Lower] [N/A:N/A] Primary Etiology: [1:Extremity Coronary Artery Disease,] [3:Extremity Coronary Artery Disease,] [N/A:N/A] Comorbid History: [1:Hypertension, Peripheral Arterial Disease, Type II Diabetes, Neuropathy 02/18/2020] [3:Hypertension, Peripheral Arterial Disease, Type II  Diabetes, Neuropathy 05/17/2020] [N/A:N/A] Date Acquired: [1:4] [3:3] [N/A:N/A] Weeks of Treatment: [1:Open] [3:Open] [N/A:N/A] Wound Status: [1:0.6x0.4x0.6] [3:0.9x1.7x0.2] [N/A:N/A] Measurements L x W x D (cm) [1:0.188] [3:1.202] [N/A:N/A] A (cm) : rea [1:0.113] [3:0.24] [N/A:N/A] Volume (cm) : [1:-100.00%] [3:-1178.70%] [N/A:N/A] % Reduction in A rea: [1:-50.70%] [3:-2566.70%] [N/A:N/A] % Reduction in Volume: [1:12] Starting Position 1 (o'clock): [1:12] Ending Position 1 (o'clock): [1:0.7] Maximum Distance 1 (cm): [1:Yes] [3:No] [N/A:N/A] Undermining: [1:Grade 2] [3:Grade 2] [N/A:N/A] Classification: [1:Medium] [3:Medium] [N/A:N/A] Exudate A mount: [1:Serosanguineous] [3:Serosanguineous] [N/A:N/A] Exudate Type: [1:red, brown] [3:red, brown] [N/A:N/A] Exudate Color: [1:Well defined, not attached] [3:Flat and Intact] [N/A:N/A] Wound Margin: [1:Large (67-100%)] [3:Medium (34-66%)] [N/A:N/A] Granulation A mount: [1:Red] [3:Pink, Pale] [N/A:N/A] Granulation Quality: [1:Small (1-33%)] [3:Medium (34-66%)] [N/A:N/A] Necrotic A mount: [1:Fat Layer (Subcutaneous Tissue): Yes Fat Layer (Subcutaneous Tissue): Yes N/A] Exposed Structures: [1:Bone: Yes Fascia: No Tendon: No Muscle: No Joint: No None] [3:Bone: Yes Fascia: No Tendon: No Muscle: No Joint: No Small  (1-33%)] [N/A:N/A] Treatment Notes Electronic Signature(s) Signed: 06/07/2020 5:03:25 PM By: Linton Ham MD Signed: 06/07/2020 5:12:11 PM By: Levan Hurst RN, BSN Signed: 06/07/2020 5:12:11 PM By: Levan Hurst RN, BSN Entered By: Linton Ham on 06/07/2020 16:11:36 -------------------------------------------------------------------------------- Multi-Disciplinary Care Plan Details Patient Name: Date of Service: [name] 06/07/2020 3:30 PM Medical Record Number: [id number] Patient Account Number: [id number] Date of Birth/Sex: Treating RN: 1925/06/23 (87 y.o. [name] Primary Care Nena Hampe: [name] Other Clinician: Referring Dwight Burdo: Treating Keeley Sussman/Extender: Greta Doom Weeks in Treatment: 4 Multidisciplinary Care Plan reviewed with physician Active Inactive Nutrition Nursing Diagnoses: Impaired glucose control: actual or potential Potential for alteratiion in Nutrition/Potential for imbalanced nutrition Goals: Patient/caregiver agrees to and verbalizes understanding of need to use nutritional supplements and/or vitamins as prescribed Date Initiated: 05/10/2020 Date Inactivated: 06/07/2020 Target Resolution Date: 06/11/2020 Goal Status: Met Patient/caregiver will maintain therapeutic glucose control Date Initiated: 05/10/2020 Target Resolution Date: 07/09/2020 Goal Status: Active Interventions: Assess HgA1c results as ordered upon admission and as needed Assess patient nutrition upon admission and as needed per policy Provide education on elevated blood sugars and impact on wound healing Provide education on nutrition Treatment Activities: Education provided on Nutrition : 05/17/2020 Notes: Wound/Skin Impairment Nursing Diagnoses: Impaired tissue integrity Knowledge deficit related to ulceration/compromised skin integrity Goals: Patient/caregiver will verbalize understanding of skin care regimen Date Initiated:  05/10/2020 Target Resolution Date: 07/09/2020 Goal Status: Active Ulcer/skin breakdown will have a volume reduction of 30% by week 4 Date Initiated: 05/10/2020 Date Inactivated: 06/07/2020 Target Resolution Date: 06/11/2020 Goal Status: Unmet Unmet Reason: Osteomyelitis, PAD Interventions: Assess patient/caregiver ability to obtain necessary supplies Assess patient/caregiver ability to perform ulcer/skin care regimen upon admission and as needed Assess ulceration(s) every visit Provide education on ulcer and skin care Notes: Electronic Signature(s) Signed: 06/07/2020 5:12:11 PM By: Levan Hurst RN, BSN Entered By: Levan Hurst on 06/07/2020 16:48:38 -------------------------------------------------------------------------------- Pain Assessment Details Patient Name: Date of Service: Peter James 06/07/2020 3:30 PM Medical Record Number: [id number] Patient Account Number: [id number] Date of Birth/Sex: Treating RN: [birth year] (87 y.o. [name] Primary Care Angles Trevizo: [name] Other Clinician: Referring Jahzion Brogden: Treating Reesa Gotschall/Extender: Greta Doom Weeks in Treatment: 4 Active Problems Location of Pain Severity and Description of Pain Patient Has Paino No Site Locations Pain Management and Medication Current Pain Management: Electronic Signature(s) Signed: 06/07/2020 4:46:01 PM By: Sandre Kitty Signed: 06/07/2020 5:12:11 PM By: Levan Hurst RN, BSN Entered By: Sandre Kitty on 06/07/2020 15:10:54 -------------------------------------------------------------------------------- Patient/Caregiver Education Details Patient Name:  Date of Service: [name] 3/21/2022andnbsp3:30 PM Medical Record Number: [id number] Patient Account Number: [id number] Date of Birth/Gender: Treating RN: January 29, 1926 (87 y.o. [name] Primary Care Physician: [name] Other Clinician: Referring Physician: Treating Physician/Extender:  Samul Dada in Treatment: 4 Education Assessment Education Provided To: Patient Education Topics Provided Wound/Skin Impairment: Methods: Explain/Verbal Responses: State content correctly Electronic Signature(s) Signed: 06/07/2020 5:12:11 PM By: Levan Hurst RN, BSN Entered By: Levan Hurst on 06/07/2020 16:48:48 -------------------------------------------------------------------------------- Wound Assessment Details Patient Name: Date of Service: [name] 06/07/2020 3:30 PM Medical Record Number: [id number] Patient Account Number: [id number] Date of Birth/Sex: Treating RN: 1925-08-13 (87 y.o. [name] Primary Care Laresha Bacorn: [name] Other Clinician: Referring Kamarius Buckbee: Treating Zianne Schubring/Extender: Greta Doom Weeks in Treatment: 4 Wound Status Wound Number: 1 Primary Diabetic Wound/Ulcer of the Lower Extremity Etiology: Wound Location: Right, Medial Foot Wound Open Wounding Event: Blister Status: Date Acquired: 02/18/2020 Comorbid Coronary Artery Disease, Hypertension, Peripheral Arterial Weeks Of Treatment: 4 History: Disease, Type II Diabetes, Neuropathy Clustered Wound: No Photos Wound Measurements Length: (cm) 0.6 Width: (cm) 0.4 Depth: (cm) 0.6 Area: (cm) 0.188 Volume: (cm) 0.113 % Reduction in Area: -100% % Reduction in Volume: -50.7% Epithelialization: None Tunneling: No Undermining: Yes Starting Position (o'clock): 12 Ending Position (o'clock): 12 Maximum Distance: (cm) 0.7 Wound Description Classification: Grade 2 Wound Margin: Well defined, not attached Exudate Amount: Medium Exudate Type: Serosanguineous Exudate Color: red, brown Foul Odor After Cleansing: No Slough/Fibrino Yes Wound Bed Granulation Amount: Large (67-100%) Exposed Structure Granulation Quality: Red Fascia Exposed: No Necrotic Amount: Small (1-33%) Fat Layer (Subcutaneous Tissue) Exposed: Yes Necrotic Quality:  Adherent Slough Tendon Exposed: No Muscle Exposed: No Joint Exposed: No Bone Exposed: Yes Treatment Notes Wound #1 (Foot) Wound Laterality: Right, Medial Cleanser Wound Cleanser Discharge Instruction: Cleanse the wound with wound cleanser or normal saline prior to applying a clean dressing using gauze sponges, not tissue or cotton balls. Peri-Wound Care Topical Primary Dressing KerraCel Ag Gelling Fiber Dressing, 2x2 in (silver alginate) Discharge Instruction: lightly pack into tunneling/undermining Secondary Dressing Woven Gauze Sponges 2x2 in Discharge Instruction: Apply over primary dressing as directed. Optifoam Non-Adhesive Dressing, 4x4 in Discharge Instruction: Foam donut Secured With [employer] Sterile, 4.5x3.1 (in/yd) Discharge Instruction: Secure with Kerlix as directed. Paper Tape, 2x10 (in/yd) Discharge Instruction: Secure dressing with tape as directed. Compression Wrap Compression Stockings Add-Ons Electronic Signature(s) Signed: 06/07/2020 4:46:01 PM By: Sandre Kitty Signed: 06/07/2020 5:12:11 PM By: Levan Hurst RN, BSN Entered By: Sandre Kitty on 06/07/2020 16:43:53 -------------------------------------------------------------------------------- Wound Assessment Details Patient Name: Date of Service: [name] 06/07/2020 3:30 PM Medical Record Number: [id number] Patient Account Number: [id number] Date of Birth/Sex: Treating RN: Sep 27, 1925 (87 y.o. [name] Primary Care Aliese Brannum: [name] Other Clinician: Referring Javyon Fontan: Treating Kazumi Lachney/Extender: Greta Doom Weeks in Treatment: 4 Wound Status Wound Number: 3 Primary Diabetic Wound/Ulcer of the Lower Extremity Etiology: Wound Location: Left T Third oe Wound Open Wounding Event: Gradually Appeared Status: Date Acquired: 05/17/2020 Comorbid Coronary Artery Disease, Hypertension, Peripheral Arterial Weeks Of Treatment: 3 History: Disease, Type II  Diabetes, Neuropathy Clustered Wound: No Photos Wound Measurements Length: (cm) 0.9 Width: (cm) 1.7 Depth: (cm) 0.2 Area: (cm) 1.202 Volume: (cm) 0.24 % Reduction in Area: -1178.7% % Reduction in Volume: -2566.7% Epithelialization: Small (1-33%) Tunneling: No Undermining: No Wound Description Classification: Grade 2 Wound Margin: Flat and Intact Exudate Amount: Medium Exudate Type: Serosanguineous Exudate Color: red, brown Foul Odor  After Cleansing: No Slough/Fibrino Yes Wound Bed Granulation Amount: Medium (34-66%) Exposed Structure Granulation Quality: Pink, Pale Fascia Exposed: No Necrotic Amount: Medium (34-66%) Fat Layer (Subcutaneous Tissue) Exposed: Yes Necrotic Quality: Adherent Slough Tendon Exposed: No Muscle Exposed: No Joint Exposed: No Bone Exposed: Yes Treatment Notes Wound #3 (Toe Third) Wound Laterality: Left Cleanser Wound Cleanser Discharge Instruction: Cleanse the wound with wound cleanser or normal saline prior to applying a clean dressing using gauze sponges, not tissue or cotton balls. Peri-Wound Care Topical Primary Dressing KerraCel Ag Gelling Fiber Dressing, 2x2 in (silver alginate) Discharge Instruction: Apply silver alginate to wound bed as instructed Secondary Dressing Woven Gauze Sponges 2x2 in Discharge Instruction: Apply over primary dressing as directed. Secured With [employer], 4.5x3.1 (in/yd) Discharge Instruction: Secure with Kerlix as directed. Paper Tape, 2x10 (in/yd) Discharge Instruction: Secure dressing with tape as directed. Compression Wrap Compression Stockings Add-Ons Electronic Signature(s) Signed: 06/07/2020 4:46:01 PM By: Sandre Kitty Signed: 06/07/2020 5:12:11 PM By: Levan Hurst RN, BSN Entered By: Sandre Kitty on 06/07/2020 16:43:35 -------------------------------------------------------------------------------- Vitals Details Patient Name: Date of Service: [name] 06/07/2020  3:30 PM Medical Record Number: [id number] Patient Account Number: [id number] Date of Birth/Sex: Treating RN: 09/20/1925 (87 y.o. [name] Primary Care Torre Schaumburg: [name] Other Clinician: Referring Kadesha Virrueta: Treating Lache Dagher/Extender: Greta Doom Weeks in Treatment: 4 Vital Signs Time Taken: 15:07 Temperature (F): 97.5 Height (in): 73 Pulse (bpm): 67 Weight (lbs): 217 Respiratory Rate (breaths/min): 18 Body Mass Index (BMI): 28.6 Blood Pressure (mmHg): 136/72 Capillary Blood Glucose (mg/dl): 119 Reference Range: 80 - 120 mg / dl Electronic Signature(s) Signed: 06/07/2020 4:46:01 PM By: Sandre Kitty Entered By: Sandre Kitty on 06/07/2020 15:10:48

## 2020-06-07 NOTE — Progress Notes (Signed)
Peter James ([id number]) Visit Report for 06/07/2020 HPI Details Patient Name: Date of Service: Peter James 06/07/2020 3:30 PM Medical Record Number: [id number] Patient Account Number: [id number] Date of Birth/Sex: Treating RN: November 21, 1925 (87 y.o. Peter James Primary Care Provider: Jerene James Other Clinician: Referring Provider: Treating Provider/Extender: Peter James Weeks in Treatment: 4 History of Present Illness HPI Description: ADMISSION 05/10/2020 This is a independent 87 year old man who has had an area on his right medial first metatarsal head according to him for roughly 3 months. This started as a skin tear that scab he removed the scab and inside an area on this since. He is a type II diabetic also had an area on his left second toe and right first metatarsal head in November so this wound may have been present longer than not. He has undergone extensive arterial review including a stent in the right popliteal laser atherectomy of the right popliteal and anterior tibial arteries on 01/19/2020. On 02/23/2020 he had a laser atherectomy of the left peroneal angioplasty of the left peroneal and amputation of the left second toe. Follow-up noninvasive studies on 03/05/2020 showed an ABI on the right of 0.75 with a TBI of 0.39. On the left his ABI was 0.77, his left great toe was amputated. He is not experiencing any pain. ALSO he had a right knee skin tear within the last week or 2. He is not really dressing either 1 of these areas Past medical; pacemaker, type 2 diabetes with PAD, osteomyelitis of the left first toe amputated on 09/29/2019, hypertension hypercholesterolemia, COPD 2/28; this is a 87 year old man who arrives I believe with his son. He is a type II diabetic. He has a wound on the medial part of the first metatarsal head. Culture I did last time showed acid Enterobacter as well as rare Enterococcus. Will be giving him Augmentin to cover both of  these for 10 days. His x-ray did not show osteomyelitis although they did comment on possible cortical erosion along the medial aspect of the head of the first metatarsal. He will definitely need an MRI. He comes into the clinic today with a new wound on the left third toe dorsal aspect over the PIP this also has exposed bone 05/25/2020 upon evaluation today patient appears to be doing well with regard to his wounds currently. Both are fairly clean. He is having a CT scan of his right foot on Friday. With that being said the toe on the left foot appears to be doing okay this is not too deep. There does not appear to be any signs of active infection at this time. No fevers, chills, nausea, vomiting, or diarrhea. 3/17; this is a patient with a wound on the medial aspect of his right first MTP. This is in the setting of severe PAD with type 2 diabetes. He has previously been revascularized on the side on 02/23/2020 however his post procedure noninvasive studies showed an ABI of 0.75 and a TBI of 0.39. He was supposed to have follow-up noninvasive studies today but because he was here and we were late he missed them but were going to try to book him sometime this week. Patient could not have an MRI of the right foot because of a pacemaker. He did have a CT scan that showed marginal erosions along the medial aspect of the first metatarsal head which may reflect osteomyelitis. The radiologist also queries underlying arthropathy such as gout. He came in 2 weeks ago  with a new wound on his left third toe. This was x-rayed on 3/16 that did not show obvious osteomyelitis by plain 3/21 semi the patient had his arterial studies on 3/18. These showed an ABI on the right of 0.69 with a TBI of 0.35 and monophasic waveforms at the dorsalis pedis, biphasic at the posterior tibial. On the left he had an ABI of 0.85, great toe amputation monophasic waveforms diffusely. He has an area on the medial part of his right first  metatarsal head which probes to bone suspicion of underlying osteomyelitis. He has a wound on the tip of his left third toe which also has exposed bone. He is been reviewed by Dr. Donzetta James on 06/04/2020. He is going to have a right lower extremity angiogram using CO2 1 week from today. Electronic Signature(s) Signed: 06/07/2020 5:03:25 PM By: Linton Ham MD Entered By: Linton Ham on 06/07/2020 16:15:14 -------------------------------------------------------------------------------- Physical Exam Details Patient Name: Date of Service: Peter James 06/07/2020 3:30 PM Medical Record Number: [id number] Patient Account Number: [id number] Date of Birth/Sex: Treating RN: Apr 21, 1925 (87 y.o. Peter James Primary Care Provider: Jerene James Other Clinician: Referring Provider: Treating Provider/Extender: Peter James Weeks in Treatment: 4 Constitutional Sitting or standing Blood Pressure is within target range for patient.. Pulse regular and within target range for patient.[name] Respirations regular, non-labored and within target range.. Temperature is normal and within the target range for the patient.[name] Appears in no distress. Notes Wound exam; the patient has a small punched-out wound on the right medial first MTP. No viable tissue here. This easily probes to bone He has an area over the dorsal part of his left third toe also white exposed bone possibly the DIP joint. I do not think this is going to be salvageable Electronic Signature(s) Signed: 06/07/2020 5:03:25 PM By: Linton Ham MD Entered By: Linton Ham on 06/07/2020 16:18:55 -------------------------------------------------------------------------------- Physician Orders Details Patient Name: Date of Service: Peter James 06/07/2020 3:30 PM Medical Record Number: [id number] Patient Account Number: [id number] Date of Birth/Sex: Treating RN: 07/11/1925 (87 y.o. 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 Bathing/ Shower/ Hygiene May shower with protection but do not get wound dressing(s) wet. Off-Loading Open toe surgical shoe to: - right foot Home Health No change in wound care orders this week; continue Home Health for wound care. May utilize formulary equivalent dressing for wound treatment orders unless otherwise specified. Dressing changes to be completed by [facility] on Monday / Wednesday / Friday except when patient has scheduled visit at [facility]. Other Home Health Orders/Instructions: - [facility] Wound Treatment Wound #1 - Foot Wound Laterality: Right, Medial Cleanser: Wound Cleanser (Home Health) 3 x Per Week/7 Days Discharge Instructions: Cleanse the wound with wound cleanser or normal saline prior to applying a clean dressing using gauze sponges, not tissue or cotton balls. Prim Dressing: KerraCel Ag Gelling Fiber Dressing, 2x2 in (silver alginate) (Home Health) 3 x Per Week/7 Days ary Discharge Instructions: lightly  pack into tunneling/undermining Secondary Dressing: Woven Gauze Sponges 2x2 in (Home Health) 3 x Per Week/7 Days Discharge Instructions: Apply over primary dressing as directed. Secondary Dressing: Optifoam Non-Adhesive Dressing, 4x4 in (Home Health) 3 x Per Week/7 Days Discharge Instructions: Foam donut Secured With: Kerlix Roll Sterile,  4.5x3.1 (in/yd) ([place]) 3 x Per Week/7 Days Discharge Instructions: Secure with Kerlix as directed. Secured With: Paper Tape, 2x10 (in/yd) (Home Health) 3 x Per Week/7 Days Discharge Instructions: Secure dressing with tape as directed. Wound #3 - T Third oe Wound Laterality: Left Cleanser: Wound Cleanser (Home Health) 3 x Per Week/7 Days Discharge Instructions: Cleanse the wound with wound cleanser or normal saline prior to applying a clean dressing using gauze sponges, not tissue or cotton balls. Prim Dressing: KerraCel Ag Gelling Fiber Dressing, 2x2 in (silver alginate) (Home Health) 3 x Per Week/7 Days ary Discharge Instructions: Apply silver alginate to wound bed as instructed Secondary Dressing: Woven Gauze Sponges 2x2 in ([place]) 3 x Per Week/7 Days Discharge Instructions: Apply over primary dressing as directed. Secured With: [employer], 4.5x3.1 (in/yd) (Home Health) 3 x Per Week/7 Days Discharge Instructions: Secure with Kerlix as directed. Secured With: Paper Tape, 2x10 (in/yd) (Home Health) 3 x Per Week/7 Days Discharge Instructions: Secure dressing with tape as directed. Electronic Signature(s) Signed: 06/07/2020 5:03:25 PM By: Linton Ham MD Signed: 06/07/2020 5:12:11 PM By: Levan Hurst RN, BSN Entered By: Levan Hurst on 06/07/2020 15:34:54 -------------------------------------------------------------------------------- Problem List Details Patient Name: Date of Service: Peter James 06/07/2020 3:30 PM Medical Record Number: [id number] Patient Account Number: [id number] Date of Birth/Sex: Treating RN: 09-Nov-1925 (87 y.o. Peter James Primary Care Provider: Jerene James Other Clinician: Referring Provider: Treating Provider/Extender: Peter James Weeks in Treatment: 4 Active Problems ICD-10 Encounter Code Description Active Date MDM Diagnosis E11.621 Type 2 diabetes mellitus with foot ulcer 05/10/2020 No Yes E11.51  Type 2 diabetes mellitus with diabetic peripheral angiopathy without gangrene 05/10/2020 No Yes L97.514 Non-pressure chronic ulcer of other part of right foot with necrosis of bone 05/10/2020 No Yes L97.524 Non-pressure chronic ulcer of other part of left foot with necrosis of bone 06/07/2020 No Yes L97.518 Non-pressure chronic ulcer of other part of right foot with other specified 05/17/2020 No Yes severity Inactive Problems Resolved Problems ICD-10 Code Description Active Date Resolved Date S80.211D Abrasion, right knee, subsequent encounter 05/10/2020 05/10/2020 Electronic Signature(s) Signed: 06/07/2020 5:03:25 PM By: Linton Ham MD Entered By: Linton Ham on 06/07/2020 16:11:26 -------------------------------------------------------------------------------- Progress Note Details Patient Name: Date of Service: Elnora Morrison N 06/07/2020 3:30 PM Medical Record Number: [id number] Patient Account Number: [id number] Date of Birth/Sex: Treating RN: 1926/03/17 (87 y.o. Peter James Primary Care Provider: Jerene James Other Clinician: Referring Provider: Treating Provider/Extender: Peter James Weeks in Treatment: 4 Subjective History of Present Illness (HPI) ADMISSION 05/10/2020 This is a independent 87 year old man who has had an area on his right medial first metatarsal head according to him for roughly 3 months. This started as a skin tear that scab he removed the scab and inside an area on this since. He is a type II diabetic also had an area on his left second toe and right first metatarsal head in November so this wound may have been present longer than not. He has undergone extensive arterial review including a stent in the right popliteal laser atherectomy of the right popliteal and anterior tibial arteries on 01/19/2020. On 02/23/2020 he had a laser atherectomy of the  left peroneal angioplasty of the left peroneal and amputation of the left second toe.  Follow-up noninvasive studies on 03/05/2020 showed an ABI on the right of 0.75 with a TBI of 0.39. On the left his ABI was 0.77, his left great toe was amputated. He is not experiencing any pain. ALSO he had a right knee skin tear within the last week or 2. He is not really dressing either 1 of these areas Past medical; pacemaker, type 2 diabetes with PAD, osteomyelitis of the left first toe amputated on 09/29/2019, hypertension hypercholesterolemia, COPD 2/28; this is a 87 year old man who arrives I believe with his son. He is a type II diabetic. He has a wound on the medial part of the first metatarsal head. Culture I did last time showed acid Enterobacter as well as rare Enterococcus. Will be giving him Augmentin to cover both of these for 10 days. His x-ray did not show osteomyelitis although they did comment on possible cortical erosion along the medial aspect of the head of the first metatarsal. He will definitely need an MRI. He comes into the clinic today with a new wound on the left third toe dorsal aspect over the PIP this also has exposed bone 05/25/2020 upon evaluation today patient appears to be doing well with regard to his wounds currently. Both are fairly clean. He is having a CT scan of his right foot on Friday. With that being said the toe on the left foot appears to be doing okay this is not too deep. There does not appear to be any signs of active infection at this time. No fevers, chills, nausea, vomiting, or diarrhea. 3/17; this is a patient with a wound on the medial aspect of his right first MTP. This is in the setting of severe PAD with type 2 diabetes. He has previously been revascularized on the side on 02/23/2020 however his post procedure noninvasive studies showed an ABI of 0.75 and a TBI of 0.39. He was supposed to have follow-up noninvasive studies today but because he was here and we were late he missed them but were going to try to book him sometime this week. Patient  could not have an MRI of the right foot because of a pacemaker. He did have a CT scan that showed marginal erosions along the medial aspect of the first metatarsal head which may reflect osteomyelitis. The radiologist also queries underlying arthropathy such as gout. He came in 2 weeks ago with a new wound on his left third toe. This was x-rayed on 3/16 that did not show obvious osteomyelitis by plain 3/21 semi the patient had his arterial studies on 3/18. These showed an ABI on the right of 0.69 with a TBI of 0.35 and monophasic waveforms at the dorsalis pedis, biphasic at the posterior tibial. On the left he had an ABI of 0.85, great toe amputation monophasic waveforms diffusely. He has an area on the medial part of his right first metatarsal head which probes to bone suspicion of underlying osteomyelitis. He has a wound on the tip of his left third toe which also has exposed bone. He is been reviewed by Dr. Donzetta James on 06/04/2020. He is going to have a right lower extremity angiogram using CO2 1 week from today. Objective Constitutional Sitting or standing Blood Pressure is within target range for patient.. Pulse regular and within target range for patient.[name] Respirations regular, non-labored and within target range.. Temperature is normal and within the target range for the patient.[name] Appears  in no distress. Vitals Time Taken: 3:07 PM, Height: 73 in, Weight: 217 lbs, BMI: 28.6, Temperature: 97.5 F, Pulse: 67 bpm, Respiratory Rate: 18 breaths/min, Blood Pressure: 136/72 mmHg, Capillary Blood Glucose: 119 mg/dl. General Notes: Wound exam; the patient has a small punched-out wound on the right medial first MTP. No viable tissue here. This easily probes to bone ooHe has an area over the dorsal part of his left third toe also white exposed bone possibly the DIP joint. I do not think this is going to be salvageable Integumentary (Hair, Skin) Wound #1 status is Open. Original cause of wound was Blister. The  date acquired was: 02/18/2020. The wound has been in treatment 4 weeks. The wound is located on the Right,Medial Foot. The wound measures 0.6cm length x 0.4cm width x 0.6cm depth; 0.188cm^2 area and 0.113cm^3 volume. There is bone and Fat Layer (Subcutaneous Tissue) exposed. There is no tunneling noted, however, there is undermining starting at 12:00 and ending at 12:00 with a maximum distance of 0.7cm. There is a medium amount of serosanguineous drainage noted. The wound margin is well defined and not attached to the wound base. There is large (67-100%) red granulation within the wound bed. There is a small (1-33%) amount of necrotic tissue within the wound bed including Adherent Slough. Wound #3 status is Open. Original cause of wound was Gradually Appeared. The date acquired was: 05/17/2020. The wound has been in treatment 3 weeks. The wound is located on the Left T Third. The wound measures 0.9cm length x 1.7cm width x 0.2cm depth; 1.202cm^2 area and 0.24cm^3 volume. There is bone oe and Fat Layer (Subcutaneous Tissue) exposed. There is no tunneling or undermining noted. There is a medium amount of serosanguineous drainage noted. The wound margin is flat and intact. There is medium (34-66%) pink, pale granulation within the wound bed. There is a medium (34-66%) amount of necrotic tissue within the wound bed including Adherent Slough. Assessment Active Problems ICD-10 Type 2 diabetes mellitus with foot ulcer Type 2 diabetes mellitus with diabetic peripheral angiopathy without gangrene Non-pressure chronic ulcer of other part of right foot with necrosis of bone Non-pressure chronic ulcer of other part of left foot with necrosis of bone Non-pressure chronic ulcer of other part of right foot with other specified severity Plan Follow-up Appointments: Return Appointment in 1 week. Bathing/ Shower/ Hygiene: May shower with protection but do not get wound dressing(s) wet. Off-Loading: Open toe  surgical shoe to: - right foot Home Health: No change in wound care orders this week; continue Home Health for wound care. May utilize formulary equivalent dressing for wound treatment orders unless otherwise specified. Dressing changes to be completed by [facility] on Monday / Wednesday / Friday except when patient has scheduled visit at [facility]. Other Home Health Orders/Instructions: - [facility] WOUND #1: - Foot Wound Laterality: Right, Medial Cleanser: Wound Cleanser (Home Health) 3 x Per Week/7 Days Discharge Instructions: Cleanse the wound with wound cleanser or normal saline prior to applying a clean dressing using gauze sponges, not tissue or cotton balls. Prim Dressing: KerraCel Ag Gelling Fiber Dressing, 2x2 in (silver alginate) (Home Health) 3 x Per Week/7 Days ary Discharge Instructions: lightly pack into tunneling/undermining Secondary Dressing: Woven Gauze Sponges 2x2 in (Home Health) 3 x Per Week/7 Days Discharge Instructions: Apply over primary dressing as directed. Secondary Dressing: Optifoam Non-Adhesive Dressing, 4x4 in (Home Health) 3 x Per Week/7 Days Discharge Instructions: Foam donut Secured With: Kerlix Roll Sterile, 4.5x3.1 (in/yd) ([place])  3 x Per Week/7 Days Discharge Instructions: Secure with Kerlix as directed. Secured With: Paper T ape, 2x10 (in/yd) (Home Health) 3 x Per Week/7 Days Discharge Instructions: Secure dressing with tape as directed. WOUND #3: - T Third Wound Laterality: Left oe Cleanser: Wound Cleanser (Home Health) 3 x Per Week/7 Days Discharge Instructions: Cleanse the wound with wound cleanser or normal saline prior to applying a clean dressing using gauze sponges, not tissue or cotton balls. Prim Dressing: KerraCel Ag Gelling Fiber Dressing, 2x2 in (silver alginate) (Home Health) 3 x Per Week/7 Days ary Discharge Instructions: Apply silver alginate to wound bed as instructed Secondary Dressing: Woven Gauze Sponges 2x2 in ([place]) 3 x Per Week/7 Days Discharge Instructions: Apply over primary dressing as directed. Secured With: [employer], 4.5x3.1 (in/yd) (Home Health) 3 x Per Week/7 Days Discharge Instructions: Secure with Kerlix as directed. Secured With: Paper T ape, 2x10 (in/yd) (Home Health) 3 x Per Week/7 Days Discharge Instructions: Secure dressing with tape as directed. 1. I am waiting for the revascularization next week. If this is successful on the right I will make an attempt again to see if we can get bone culture from the right. 2. On the left all not the bone down to see if we can get closure otherwise I think we are going to lose this toe Electronic Signature(s) Signed: 06/07/2020 5:03:25 PM By: Linton Ham MD Entered By: Linton Ham on 06/07/2020 16:19:49 -------------------------------------------------------------------------------- SuperBill Details Patient Name: Date of Service: Peter James 06/07/2020 Medical Record Number: [id number] Patient Account Number: [id number] Date of Birth/Sex: Treating RN: 1925-06-03 (87 y.o. Peter James Primary Care Provider: Jerene James Other Clinician: Referring Provider: Treating Provider/Extender: Peter James Weeks in Treatment: 4 Diagnosis Coding ICD-10 Codes Code Description E11.621 Type 2 diabetes mellitus with foot ulcer E11.51 Type 2 diabetes mellitus with diabetic peripheral angiopathy without gangrene L97.514 Non-pressure chronic ulcer of other part of right foot with necrosis of bone L97.524 Non-pressure chronic ulcer of other part of left foot with necrosis of bone L97.518 Non-pressure chronic ulcer of other part of right foot with other specified severity Facility Procedures CPT4 Code: [id number] Description: 99214 - WOUND CARE VISIT-LEV 4 EST PT Modifier: Quantity: 1 Physician Procedures : CPT4 Code Description Modifier [id number] - WC PHYS LEVEL 3 - EST PT ICD-10 Diagnosis Description  L97.514 Non-pressure chronic ulcer of other part of right foot with necrosis of bone L97.524 Non-pressure chronic ulcer of other part of left foot with  necrosis of bone E11.51 Type 2 diabetes mellitus with diabetic peripheral angiopathy without gangrene Quantity: 1 Electronic Signature(s) Signed: 06/07/2020 5:03:25 PM By: Linton Ham MD Signed: 06/07/2020 5:12:11 PM By: Levan Hurst RN, BSN Entered By: Levan Hurst on 06/07/2020 16:49:50

## 2020-06-08 ENCOUNTER — Telehealth: Payer: Self-pay

## 2020-06-08 ENCOUNTER — Other Ambulatory Visit: Payer: Self-pay | Admitting: Physician Assistant

## 2020-06-08 DIAGNOSIS — I739 Peripheral vascular disease, unspecified: Secondary | ICD-10-CM

## 2020-06-08 MED ORDER — TRAMADOL HCL 50 MG PO TABS: 50.0000 mg | ORAL_TABLET | Freq: Four times a day (QID) | ORAL | 0 refills | Status: DC | PRN

## 2020-06-08 NOTE — Telephone Encounter (Signed)
Patient just seen in our office and scheduled for aortogram on 06/14/20. He is having severe pain in his foot and would like some pain medicine. Sent in tramadol RX. Patient aware.

## 2020-06-09 DIAGNOSIS — E11621 Type 2 diabetes mellitus with foot ulcer: Secondary | ICD-10-CM | POA: Diagnosis not present

## 2020-06-09 DIAGNOSIS — Z4781 Encounter for orthopedic aftercare following surgical amputation: Secondary | ICD-10-CM | POA: Diagnosis not present

## 2020-06-09 DIAGNOSIS — L97512 Non-pressure chronic ulcer of other part of right foot with fat layer exposed: Secondary | ICD-10-CM | POA: Diagnosis not present

## 2020-06-09 DIAGNOSIS — E785 Hyperlipidemia, unspecified: Secondary | ICD-10-CM | POA: Diagnosis not present

## 2020-06-09 DIAGNOSIS — I110 Hypertensive heart disease with heart failure: Secondary | ICD-10-CM | POA: Diagnosis not present

## 2020-06-09 DIAGNOSIS — I5030 Unspecified diastolic (congestive) heart failure: Secondary | ICD-10-CM | POA: Diagnosis not present

## 2020-06-09 DIAGNOSIS — E1140 Type 2 diabetes mellitus with diabetic neuropathy, unspecified: Secondary | ICD-10-CM | POA: Diagnosis not present

## 2020-06-09 DIAGNOSIS — E1151 Type 2 diabetes mellitus with diabetic peripheral angiopathy without gangrene: Secondary | ICD-10-CM | POA: Diagnosis not present

## 2020-06-09 DIAGNOSIS — I2510 Atherosclerotic heart disease of native coronary artery without angina pectoris: Secondary | ICD-10-CM | POA: Diagnosis not present

## 2020-06-11 ENCOUNTER — Other Ambulatory Visit: Payer: Medicare HMO

## 2020-06-11 DIAGNOSIS — I5030 Unspecified diastolic (congestive) heart failure: Secondary | ICD-10-CM | POA: Diagnosis not present

## 2020-06-11 DIAGNOSIS — I2510 Atherosclerotic heart disease of native coronary artery without angina pectoris: Secondary | ICD-10-CM | POA: Diagnosis not present

## 2020-06-11 DIAGNOSIS — L97512 Non-pressure chronic ulcer of other part of right foot with fat layer exposed: Secondary | ICD-10-CM | POA: Diagnosis not present

## 2020-06-11 DIAGNOSIS — E785 Hyperlipidemia, unspecified: Secondary | ICD-10-CM | POA: Diagnosis not present

## 2020-06-11 DIAGNOSIS — E11621 Type 2 diabetes mellitus with foot ulcer: Secondary | ICD-10-CM | POA: Diagnosis not present

## 2020-06-11 DIAGNOSIS — I110 Hypertensive heart disease with heart failure: Secondary | ICD-10-CM | POA: Diagnosis not present

## 2020-06-11 DIAGNOSIS — E1151 Type 2 diabetes mellitus with diabetic peripheral angiopathy without gangrene: Secondary | ICD-10-CM | POA: Diagnosis not present

## 2020-06-11 DIAGNOSIS — E1140 Type 2 diabetes mellitus with diabetic neuropathy, unspecified: Secondary | ICD-10-CM | POA: Diagnosis not present

## 2020-06-11 DIAGNOSIS — Z4781 Encounter for orthopedic aftercare following surgical amputation: Secondary | ICD-10-CM | POA: Diagnosis not present

## 2020-06-12 ENCOUNTER — Other Ambulatory Visit (HOSPITAL_COMMUNITY)
Admission: RE | Admit: 2020-06-12 | Discharge: 2020-06-12 | Disposition: A | Discharge status: Home / Self Care | Payer: No Typology Code available for payment source | Source: Ambulatory Visit | Attending: Vascular Surgery | Admitting: Vascular Surgery

## 2020-06-12 DIAGNOSIS — Z01812 Encounter for preprocedural laboratory examination: Secondary | ICD-10-CM | POA: Diagnosis present

## 2020-06-12 DIAGNOSIS — Z20822 Contact with and (suspected) exposure to covid-19: Secondary | ICD-10-CM | POA: Diagnosis not present

## 2020-06-12 LAB — SARS CORONAVIRUS 2 (TAT 6-24 HRS): SARS Coronavirus 2: NEGATIVE

## 2020-06-14 ENCOUNTER — Other Ambulatory Visit: Payer: Self-pay

## 2020-06-14 ENCOUNTER — Ambulatory Visit (HOSPITAL_COMMUNITY)
Admission: RE | Admit: 2020-06-14 | Discharge: 2020-06-14 | Disposition: A | Discharge status: Home / Self Care | Payer: No Typology Code available for payment source | Attending: Vascular Surgery | Admitting: Vascular Surgery

## 2020-06-14 ENCOUNTER — Encounter (HOSPITAL_COMMUNITY)
Admission: RE | Disposition: A | Discharge status: Home / Self Care | Payer: Self-pay | Source: Home / Self Care | Attending: Vascular Surgery

## 2020-06-14 DIAGNOSIS — I70245 Atherosclerosis of native arteries of left leg with ulceration of other part of foot: Secondary | ICD-10-CM | POA: Insufficient documentation

## 2020-06-14 DIAGNOSIS — I70235 Atherosclerosis of native arteries of right leg with ulceration of other part of foot: Secondary | ICD-10-CM | POA: Insufficient documentation

## 2020-06-14 DIAGNOSIS — E11621 Type 2 diabetes mellitus with foot ulcer: Secondary | ICD-10-CM | POA: Diagnosis not present

## 2020-06-14 DIAGNOSIS — L97529 Non-pressure chronic ulcer of other part of left foot with unspecified severity: Secondary | ICD-10-CM | POA: Insufficient documentation

## 2020-06-14 DIAGNOSIS — Z7902 Long term (current) use of antithrombotics/antiplatelets: Secondary | ICD-10-CM | POA: Insufficient documentation

## 2020-06-14 DIAGNOSIS — I998 Other disorder of circulatory system: Secondary | ICD-10-CM | POA: Diagnosis not present

## 2020-06-14 DIAGNOSIS — E1151 Type 2 diabetes mellitus with diabetic peripheral angiopathy without gangrene: Secondary | ICD-10-CM | POA: Insufficient documentation

## 2020-06-14 DIAGNOSIS — Z87891 Personal history of nicotine dependence: Secondary | ICD-10-CM | POA: Diagnosis not present

## 2020-06-14 DIAGNOSIS — Z7982 Long term (current) use of aspirin: Secondary | ICD-10-CM | POA: Diagnosis not present

## 2020-06-14 DIAGNOSIS — Z79899 Other long term (current) drug therapy: Secondary | ICD-10-CM | POA: Diagnosis not present

## 2020-06-14 DIAGNOSIS — L97519 Non-pressure chronic ulcer of other part of right foot with unspecified severity: Secondary | ICD-10-CM | POA: Diagnosis not present

## 2020-06-14 HISTORY — PX: PERIPHERAL VASCULAR INTERVENTION: CATH118257

## 2020-06-14 HISTORY — PX: ABDOMINAL AORTOGRAM W/LOWER EXTREMITY: CATH118223

## 2020-06-14 LAB — POCT I-STAT, CHEM 8
BUN: 30 mg/dL — ABNORMAL HIGH (ref 8–23)
Calcium, Ion: 1.38 mmol/L (ref 1.15–1.40)
Chloride: 104 mmol/L (ref 98–111)
Creatinine, Ser: 1.5 mg/dL — ABNORMAL HIGH (ref 0.61–1.24)
Glucose, Bld: 110 mg/dL — ABNORMAL HIGH (ref 70–99)
HCT: 41 % (ref 39.0–52.0)
Hemoglobin: 13.9 g/dL (ref 13.0–17.0)
Potassium: 4.4 mmol/L (ref 3.5–5.1)
Sodium: 142 mmol/L (ref 135–145)
TCO2: 25 mmol/L (ref 22–32)

## 2020-06-14 SURGERY — ABDOMINAL AORTOGRAM W/LOWER EXTREMITY
Anesthesia: LOCAL | Laterality: Right

## 2020-06-14 MED ORDER — HEPARIN SODIUM (PORCINE) 1000 UNIT/ML IJ SOLN
INTRAMUSCULAR | Status: DC | PRN
  Administered 2020-06-14: 10000 [IU] via INTRAVENOUS

## 2020-06-14 MED ORDER — SODIUM CHLORIDE 0.9 % IV SOLN: 250.0000 mL | INTRAVENOUS | Status: DC | PRN

## 2020-06-14 MED ORDER — OXYCODONE HCL 5 MG PO TABS: 5.0000 mg | ORAL_TABLET | ORAL | Status: DC | PRN

## 2020-06-14 MED ORDER — LABETALOL HCL 5 MG/ML IV SOLN: 10.0000 mg | INTRAVENOUS | Status: DC | PRN

## 2020-06-14 MED ORDER — CLOPIDOGREL BISULFATE 75 MG PO TABS
ORAL_TABLET | ORAL | Status: AC
  Filled 2020-06-14: qty 1

## 2020-06-14 MED ORDER — ACETAMINOPHEN 325 MG PO TABS: 650.0000 mg | ORAL_TABLET | ORAL | Status: DC | PRN

## 2020-06-14 MED ORDER — HYDRALAZINE HCL 20 MG/ML IJ SOLN: 5.0000 mg | INTRAMUSCULAR | Status: DC | PRN

## 2020-06-14 MED ORDER — HEPARIN (PORCINE) IN NACL 1000-0.9 UT/500ML-% IV SOLN
INTRAVENOUS | Status: DC | PRN
  Administered 2020-06-14 (×2): 500 mL

## 2020-06-14 MED ORDER — HEPARIN (PORCINE) IN NACL 1000-0.9 UT/500ML-% IV SOLN
INTRAVENOUS | Status: AC
  Filled 2020-06-14: qty 1000

## 2020-06-14 MED ORDER — HEPARIN SODIUM (PORCINE) 1000 UNIT/ML IJ SOLN
INTRAMUSCULAR | Status: AC
  Filled 2020-06-14: qty 1

## 2020-06-14 MED ORDER — SODIUM CHLORIDE 0.9 % IV SOLN: INTRAVENOUS | Status: DC

## 2020-06-14 MED ORDER — SODIUM CHLORIDE 0.9% FLUSH: 3.0000 mL | Freq: Two times a day (BID) | INTRAVENOUS | Status: DC

## 2020-06-14 MED ORDER — LIDOCAINE HCL (PF) 1 % IJ SOLN
INTRAMUSCULAR | Status: AC
  Filled 2020-06-14: qty 30

## 2020-06-14 MED ORDER — SODIUM CHLORIDE 0.9% FLUSH: 3.0000 mL | INTRAVENOUS | Status: DC | PRN

## 2020-06-14 MED ORDER — LIDOCAINE HCL (PF) 1 % IJ SOLN
INTRAMUSCULAR | Status: DC | PRN
  Administered 2020-06-14: 10 mL via INTRADERMAL
  Administered 2020-06-14: 2 mL via INTRADERMAL

## 2020-06-14 MED ORDER — CLOPIDOGREL BISULFATE 75 MG PO TABS
ORAL_TABLET | ORAL | Status: DC | PRN
  Administered 2020-06-14: 75 mg via ORAL

## 2020-06-14 MED ORDER — ONDANSETRON HCL 4 MG/2ML IJ SOLN: 4.0000 mg | Freq: Four times a day (QID) | INTRAMUSCULAR | Status: DC | PRN

## 2020-06-14 SURGICAL SUPPLY — 29 items
BALLN STERLING OTW 3X220X150 (BALLOONS) ×3
BALLOON STERLING OTW 3X220X150 (BALLOONS) ×2 IMPLANT
CATH AURYON 5FR ATHEREC 1.5 (CATHETERS) ×3 IMPLANT
CATH CXI 2.6F 65 ANG (CATHETERS) ×2
CATH CXI SUPP 2.6F 150 ST (CATHETERS) ×3 IMPLANT
CATH OMNI FLUSH 5F 65CM (CATHETERS) ×3 IMPLANT
CATH SPRT ANG 65X2.3FR PLATN (CATHETERS) ×2 IMPLANT
CATH TEMPO AQUA 5F 100CM (CATHETERS) ×3 IMPLANT
CLOSURE MYNX CONTROL 6F/7F (Vascular Products) ×3 IMPLANT
DEVICE ONE SNARE 10MM (MISCELLANEOUS) ×3 IMPLANT
FILTER CO2 0.2 MICRON (VASCULAR PRODUCTS) ×3 IMPLANT
KIT ENCORE 26 ADVANTAGE (KITS) ×3 IMPLANT
KIT MICROPUNCTURE NIT STIFF (SHEATH) ×3 IMPLANT
KIT PV (KITS) ×3 IMPLANT
PATCH THROMBIX TOPICAL PLAIN (HEMOSTASIS) ×3 IMPLANT
RESERVOIR CO2 (VASCULAR PRODUCTS) ×3 IMPLANT
SET FLUSH CO2 (MISCELLANEOUS) ×3 IMPLANT
SHEATH MICROPUNCTURE PEDAL 4FR (SHEATH) ×3 IMPLANT
SHEATH PINNACLE 5F 10CM (SHEATH) ×3 IMPLANT
SHEATH PINNACLE 6F 10CM (SHEATH) ×3 IMPLANT
SHEATH PINNACLE MP 6F 45CM (SHEATH) ×3 IMPLANT
SHEATH PROBE COVER 6X72 (BAG) ×3 IMPLANT
SYR MEDRAD MARK 7 150ML (SYRINGE) ×3 IMPLANT
TRANSDUCER W/STOPCOCK (MISCELLANEOUS) ×3 IMPLANT
TRAY PV CATH (CUSTOM PROCEDURE TRAY) ×3 IMPLANT
WIRE BENTSON .035X145CM (WIRE) ×3 IMPLANT
WIRE G V18X300CM (WIRE) ×6 IMPLANT
WIRE HI TORQ VERSACORE J 260CM (WIRE) ×3 IMPLANT
WIRE SPARTACORE .014X300CM (WIRE) ×3 IMPLANT

## 2020-06-14 NOTE — Progress Notes (Signed)
Pt ambulated without difficulty or bleeding.   Discharged home with daughter who will drive and stay with pt x 24 hrs 

## 2020-06-14 NOTE — Op Note (Signed)
Patient name: Peter James. MRN: [id number] DOB: 1925/05/31 Sex: male  06/14/2020 Pre-operative Diagnosis: Critical right lower extremity ischemia with wound Post-operative diagnosis:  Same Surgeon:  Peter Quan C. Donzetta Matters, MD Procedure Performed: 1.  Ultrasound-guided cannulation left common femoral artery 2.  CO2 aortogram 3.  Selection of right common femoral artery and right lower extremity CO2 and contrasted angiogram 4.  Ultrasound-guided cannulation right anterior tibial artery 5.  Laser arthrectomy right anterior tibial artery 6.  Balloon angioplasty with 3 mm balloon right anterior tibial artery 7.  Mynx device closure left common femoral artery   Indications: 87 year old male with history of bilateral lower extremity previous interventions.  He has drainage from the medial aspect of the first metatarsal head and has been followed by the wound care center.  He is now indicated for angiography with possible intervention.  Findings: Aorta does not appear to have any flow-limiting stenosis with CO2.  Right lower extremity was evaluated with CO2 to the level of the knee with no flow-limiting stenosis.  Previous below-knee popliteal leading into the anterior tibial artery stent is patent.  The posterior tibial and peroneal arteries are patent proximally the occluded mid calf.  The intertibial artery is patent proximally and occludes for approximately 15 cm in the leg then reconstitute and is the dominant runoff leading to the foot.  After intervention there is no further stenosis or dissection were previously was occluded and there is very strong dorsalis pedis signal in the right foot.   Procedure:  The patient was identified in the holding area and taken to room 8.  The patient was then placed supine on the table and prepped and draped in the usual sterile fashion.  A time out was called.  Ultrasound was used to evaluate the left common femoral artery which was noted to be patent.  The  area was anesthetized 1% lidocaine cannulated with micropuncture needle followed by a wire and a sheath.  And images saved the permanent record.  We placed a Bentson wire followed by 5 French sheath and Omni catheter to the level of L1.  CO2 aortogram was performed.  We then crossed bifurcation with Omni catheter and Bentson wire.  We performed CO2 angiogram to the level of the knee and then used the versa core wire to the level of the knee placed a straight catheter and use contrast angiogram both below that.  We then placed a long 6 French sheath from the left into the right common femoral artery and the patient was fully heparinized.  V 18 was used to attempt to cross the occluded anterior tibial artery but I could not.  I then used ultrasound prepped out the right foot evaluated the right anterior tibial artery this was cannulated with micropuncture needle followed by wire and sheath.  We then used a V 18 and CXI catheter to cross into the popliteal artery.  I confirmed intraluminal access with angiography and then placed [place] core wire.  This was snared and through and through access was pulled.  Laser atherectomy was performed of the anterior tibial artery for the entirety of the previously occluded segment.  We then primarily performed balloon angioplasty first at nominal pressure of 6 atm with a 220 mm balloon followed by 2 further inflations at 4 atm.  Completion demonstrated no residual stenosis or dissection.  There was a very strong signal distally in the dorsalis pedis artery.  I exchanged for a short 6 French sheath deployed a minx in  the left common femoral artery sheath was pulled in the right anterior tibial artery and pressure was held until hemostasis obtained.  He tolerated procedure well without any complication.   Contrast: 30cc  Peter James C. Donzetta Matters, MD [facility] Office: [phone] Pager: [phone]

## 2020-06-14 NOTE — Discharge Instructions (Signed)

## 2020-06-14 NOTE — Interval H&P Note (Signed)
History and Physical Interval Note:  06/14/2020 11:29 AM  Peter James.  has presented today for surgery, with the diagnosis of PAD.  The various methods of treatment have been discussed with the patient and family. After consideration of risks, benefits and other options for treatment, the patient has consented to  Procedure(s): ABDOMINAL AORTOGRAM W/LOWER EXTREMITY (N/A) as a surgical intervention.  The patient's history has been reviewed, patient examined, no change in status, stable for surgery.  I have reviewed the patient's chart and labs.  Questions were answered to the patient's satisfaction.     Servando Snare

## 2020-06-15 ENCOUNTER — Encounter (HOSPITAL_COMMUNITY): Payer: Self-pay | Admitting: Vascular Surgery

## 2020-06-15 LAB — POCT I-STAT, CHEM 8
BUN: 47 mg/dL — ABNORMAL HIGH (ref 8–23)
Calcium, Ion: 1.27 mmol/L (ref 1.15–1.40)
Chloride: 105 mmol/L (ref 98–111)
Creatinine, Ser: 1.5 mg/dL — ABNORMAL HIGH (ref 0.61–1.24)
Glucose, Bld: 108 mg/dL — ABNORMAL HIGH (ref 70–99)
HCT: 40 % (ref 39.0–52.0)
Hemoglobin: 13.6 g/dL (ref 13.0–17.0)
Potassium: 7.4 mmol/L (ref 3.5–5.1)
Sodium: 139 mmol/L (ref 135–145)
TCO2: 30 mmol/L (ref 22–32)

## 2020-06-16 DIAGNOSIS — L97512 Non-pressure chronic ulcer of other part of right foot with fat layer exposed: Secondary | ICD-10-CM | POA: Diagnosis not present

## 2020-06-16 DIAGNOSIS — E785 Hyperlipidemia, unspecified: Secondary | ICD-10-CM | POA: Diagnosis not present

## 2020-06-16 DIAGNOSIS — I2510 Atherosclerotic heart disease of native coronary artery without angina pectoris: Secondary | ICD-10-CM | POA: Diagnosis not present

## 2020-06-16 DIAGNOSIS — Z4781 Encounter for orthopedic aftercare following surgical amputation: Secondary | ICD-10-CM | POA: Diagnosis not present

## 2020-06-16 DIAGNOSIS — E1151 Type 2 diabetes mellitus with diabetic peripheral angiopathy without gangrene: Secondary | ICD-10-CM | POA: Diagnosis not present

## 2020-06-16 DIAGNOSIS — I5030 Unspecified diastolic (congestive) heart failure: Secondary | ICD-10-CM | POA: Diagnosis not present

## 2020-06-16 DIAGNOSIS — E1140 Type 2 diabetes mellitus with diabetic neuropathy, unspecified: Secondary | ICD-10-CM | POA: Diagnosis not present

## 2020-06-16 DIAGNOSIS — E11621 Type 2 diabetes mellitus with foot ulcer: Secondary | ICD-10-CM | POA: Diagnosis not present

## 2020-06-16 DIAGNOSIS — I110 Hypertensive heart disease with heart failure: Secondary | ICD-10-CM | POA: Diagnosis not present

## 2020-06-17 ENCOUNTER — Other Ambulatory Visit: Payer: Self-pay

## 2020-06-17 DIAGNOSIS — I110 Hypertensive heart disease with heart failure: Secondary | ICD-10-CM | POA: Diagnosis not present

## 2020-06-17 DIAGNOSIS — I2510 Atherosclerotic heart disease of native coronary artery without angina pectoris: Secondary | ICD-10-CM | POA: Diagnosis not present

## 2020-06-17 DIAGNOSIS — E11621 Type 2 diabetes mellitus with foot ulcer: Secondary | ICD-10-CM | POA: Diagnosis not present

## 2020-06-17 DIAGNOSIS — I739 Peripheral vascular disease, unspecified: Secondary | ICD-10-CM

## 2020-06-17 DIAGNOSIS — E1151 Type 2 diabetes mellitus with diabetic peripheral angiopathy without gangrene: Secondary | ICD-10-CM | POA: Diagnosis not present

## 2020-06-17 DIAGNOSIS — E785 Hyperlipidemia, unspecified: Secondary | ICD-10-CM | POA: Diagnosis not present

## 2020-06-17 DIAGNOSIS — Z4781 Encounter for orthopedic aftercare following surgical amputation: Secondary | ICD-10-CM | POA: Diagnosis not present

## 2020-06-17 DIAGNOSIS — I5030 Unspecified diastolic (congestive) heart failure: Secondary | ICD-10-CM | POA: Diagnosis not present

## 2020-06-17 DIAGNOSIS — L97512 Non-pressure chronic ulcer of other part of right foot with fat layer exposed: Secondary | ICD-10-CM | POA: Diagnosis not present

## 2020-06-17 DIAGNOSIS — E1140 Type 2 diabetes mellitus with diabetic neuropathy, unspecified: Secondary | ICD-10-CM | POA: Diagnosis not present

## 2020-06-18 ENCOUNTER — Encounter (HOSPITAL_BASED_OUTPATIENT_CLINIC_OR_DEPARTMENT_OTHER): Payer: Medicare HMO | Attending: Internal Medicine | Admitting: Internal Medicine

## 2020-06-18 ENCOUNTER — Other Ambulatory Visit: Payer: Self-pay

## 2020-06-18 DIAGNOSIS — L97526 Non-pressure chronic ulcer of other part of left foot with bone involvement without evidence of necrosis: Secondary | ICD-10-CM | POA: Diagnosis not present

## 2020-06-18 DIAGNOSIS — Z89412 Acquired absence of left great toe: Secondary | ICD-10-CM | POA: Diagnosis not present

## 2020-06-18 DIAGNOSIS — E1151 Type 2 diabetes mellitus with diabetic peripheral angiopathy without gangrene: Secondary | ICD-10-CM | POA: Diagnosis not present

## 2020-06-18 DIAGNOSIS — E11621 Type 2 diabetes mellitus with foot ulcer: Secondary | ICD-10-CM | POA: Insufficient documentation

## 2020-06-18 DIAGNOSIS — I10 Essential (primary) hypertension: Secondary | ICD-10-CM | POA: Insufficient documentation

## 2020-06-18 DIAGNOSIS — L97524 Non-pressure chronic ulcer of other part of left foot with necrosis of bone: Secondary | ICD-10-CM | POA: Insufficient documentation

## 2020-06-18 DIAGNOSIS — L97514 Non-pressure chronic ulcer of other part of right foot with necrosis of bone: Secondary | ICD-10-CM | POA: Diagnosis not present

## 2020-06-18 DIAGNOSIS — J449 Chronic obstructive pulmonary disease, unspecified: Secondary | ICD-10-CM | POA: Diagnosis not present

## 2020-06-18 DIAGNOSIS — Z950 Presence of cardiac pacemaker: Secondary | ICD-10-CM | POA: Diagnosis not present

## 2020-06-18 DIAGNOSIS — L97518 Non-pressure chronic ulcer of other part of right foot with other specified severity: Secondary | ICD-10-CM | POA: Diagnosis not present

## 2020-06-18 NOTE — Progress Notes (Signed)
Peter James, Peter James ([id number]) Visit Report for 06/18/2020 Debridement Details Patient Name: Date of Service: [name] 06/18/2020 9:45 A M Medical Record Number: [id number] Patient Account Number: [id number] Date of Birth/Sex: Treating RN: [birth year] (87 y.o. Marcheta Grammes Primary Care Provider: [name] Other Clinician: Referring Provider: Treating Provider/Extender: Greta Doom Weeks in Treatment: 5 Debridement Performed for Assessment: Wound #3 Left T Third oe Performed By: Physician Ricard Dillon., MD Debridement Type: Debridement Severity of Tissue Pre Debridement: Bone involvement without necrosis Level of Consciousness (Pre-procedure): Awake and Alert Pre-procedure Verification/Time Out Yes - 10:30 Taken: Start Time: 10:31 T Area Debrided (L x W): otal 1.1 (cm) x 2.4 (cm) = 2.64 (cm) Tissue and other material debrided: Non-Viable, Cartilage, Subcutaneous Level: Skin/Subcutaneous Tissue/Muscle/Bone Debridement Description: Excisional Instrument: Forceps, Scissors Bleeding: Minimum Hemostasis Achieved: Pressure End Time: 10:36 Response to Treatment: Procedure was tolerated well Level of Consciousness (Post- Awake and Alert procedure): Post Debridement Measurements of Total Wound Length: (cm) 1.1 Width: (cm) 2.4 Depth: (cm) 0.7 Volume: (cm) 1.451 Character of Wound/Ulcer Post Debridement: Stable Severity of Tissue Post Debridement: Bone involvement without necrosis Post Procedure Diagnosis Same as Pre-procedure Electronic Signature(s) Signed: 06/18/2020 5:01:47 PM By: Linton Ham MD Signed: 06/18/2020 5:18:29 PM By: Lorrin Jackson Entered By: Linton Ham on 06/18/2020 11:25:05 -------------------------------------------------------------------------------- HPI Details Patient Name: Date of Service: [name] 06/18/2020 9:45 A M Medical Record Number: [id number] Patient Account Number: [id number] Date of Birth/Sex:  Treating RN: November 21, 1925 (87 y.o. Marcheta Grammes Primary Care Provider: [name] Other Clinician: Referring Provider: Treating Provider/Extender: Greta Doom Weeks in Treatment: 5 History of Present Illness HPI Description: ADMISSION 05/10/2020 This is a independent 87 year old man who has had an area on his right medial first metatarsal head according to him for roughly 3 months. This started as a skin tear that scab he removed the scab and inside an area on this since. He is a type II diabetic also had an area on his left second toe and right first metatarsal head in November so this wound may have been present longer than not. He has undergone extensive arterial review including a stent in the right popliteal laser atherectomy of the right popliteal and anterior tibial arteries on 01/19/2020. On 02/23/2020 he had a laser atherectomy of the left peroneal angioplasty of the left peroneal and amputation of the left second toe. Follow-up noninvasive studies on 03/05/2020 showed an ABI on the right of 0.75 with a TBI of 0.39. On the left his ABI was 0.77, his left great toe was amputated. He is not experiencing any pain. ALSO he had a right knee skin tear within the last week or 2. He is not really dressing either 1 of these areas Past medical; pacemaker, type 2 diabetes with PAD, osteomyelitis of the left first toe amputated on 09/29/2019, hypertension hypercholesterolemia, COPD 2/28; this is a 87 year old man who arrives I believe with his son. He is a type II diabetic. He has a wound on the medial part of the first metatarsal head. Culture I did last time showed acid Enterobacter as well as rare Enterococcus. Will be giving him Augmentin to cover both of these for 10 days. His x-ray did not show osteomyelitis although they did comment on possible cortical erosion along the medial aspect of the head of the first metatarsal. He will definitely need an MRI. He comes into the clinic  today with a new wound on the left third toe dorsal  aspect over the PIP this also has exposed bone 05/25/2020 upon evaluation today patient appears to be doing well with regard to his wounds currently. Both are fairly clean. He is having a CT scan of his right foot on Friday. With that being said the toe on the left foot appears to be doing okay this is not too deep. There does not appear to be any signs of active infection at this time. No fevers, chills, nausea, vomiting, or diarrhea. 3/17; this is a patient with a wound on the medial aspect of his right first MTP. This is in the setting of severe PAD with type 2 diabetes. He has previously been revascularized on the side on 02/23/2020 however his post procedure noninvasive studies showed an ABI of 0.75 and a TBI of 0.39. He was supposed to have follow-up noninvasive studies today but because he was here and we were late he missed them but were going to try to book him sometime this week. Patient could not have an MRI of the right foot because of a pacemaker. He did have a CT scan that showed marginal erosions along the medial aspect of the first metatarsal head which may reflect osteomyelitis. The radiologist also queries underlying arthropathy such as gout. He came in 2 weeks ago with a new wound on his left third toe. This was x-rayed on 3/16 that did not show obvious osteomyelitis by plain 3/21 semi the patient had his arterial studies on 3/18. These showed an ABI on the right of 0.69 with a TBI of 0.35 and monophasic waveforms at the dorsalis pedis, biphasic at the posterior tibial. On the left he had an ABI of 0.85, great toe amputation monophasic waveforms diffusely. He has an area on the medial part of his right first metatarsal head which probes to bone suspicion of underlying osteomyelitis. He has a wound on the tip of his left third toe which also has exposed bone. He is been reviewed by Dr. Donzetta Matters on 06/04/2020. He is going to have a right lower  extremity angiogram using CO2 1 week from today. 4/1; the patient went to the Cath Lab by Dr. Donzetta Matters of vein and vascular. He underwent laser atherectomy of the right anterior tibial artery and a balloon angioplasty of the right anterior tibial artery. He has wounds on the medial right first metatarsal head and his left third toe. The left third toe was not looking at all healthy. Exposed bone in fact I think part of the joint was protruding today. This is not going to be salvageable. Electronic Signature(s) Signed: 06/18/2020 5:01:47 PM By: Linton Ham MD Entered By: Linton Ham on 06/18/2020 11:26:15 -------------------------------------------------------------------------------- Physical Exam Details Patient Name: Date of Service: [name] 06/18/2020 9:45 A M Medical Record Number: [id number] Patient Account Number: [id number] Date of Birth/Sex: Treating RN: May 06, 1925 (87 y.o. Marcheta Grammes Primary Care Provider: [name] Other Clinician: Referring Provider: Treating Provider/Extender: Greta Doom Weeks in Treatment: 5 Constitutional Sitting or standing Blood Pressure is within target range for patient.. Pulse regular and within target range for patient.[name] Respirations regular, non-labored and within target range.. Temperature is normal and within the target range for the patient.[name] Appears in no distress. Notes Wound exam; the patient has a small punched-out area on the right medial first MTP. This actually looks some better this week. There appears to be a surface I am not exactly sure that this is viable but it did look better. The area over  the dorsal part of his left third toe is completely nonviable. I think there is exposed cartilage that I removed with pickups and scissors along with some debris. I think this is actually part of the joint underneath the wound. Electronic Signature(s) Signed: 06/18/2020 5:01:47 PM By: Linton Ham MD Entered By:  Linton Ham on 06/18/2020 11:27:32 -------------------------------------------------------------------------------- Physician Orders Details Patient Name: Date of Service: [name] 06/18/2020 9:45 A M Medical Record Number: [id number] Patient Account Number: [id number] Date of Birth/Sex: Treating RN: [birth year] (87 y.o. Marcheta Grammes Primary Care Provider: [name] Other Clinician: Referring Provider: Treating Provider/Extender: Greta Doom Weeks in Treatment: 5 Verbal / Phone Orders: No Diagnosis Coding Follow-up Appointments Return A ppointment in 1 week. Other: - Referral to Dr. Kasandra Knudsen for amputation of left 3rd toe Bathing/ Shower/ Hygiene May shower with protection but do not get wound dressing(s) wet. Off-Loading Open toe surgical shoe to: - right foot Home Health New wound care orders this week; continue Home Health for wound care. May utilize formulary equivalent dressing for wound treatment orders unless otherwise specified. - Primary dressing change to right foot Dressing changes to be completed by Hugo on Monday / Wednesday / Friday except when patient has scheduled visit at [facility]. Other Home Health Orders/Instructions: - [facility] Wound Treatment Wound #1 - Foot Wound Laterality: Right, Medial Cleanser: Wound Cleanser (Home Health) 3 x Per Week/7 Days Discharge Instructions: Cleanse the wound with wound cleanser or normal saline prior to applying a clean dressing using gauze sponges, not tissue or cotton balls. Prim Dressing: Endoform 2x2 in ([place]) 3 x Per Week/7 Days ary Discharge Instructions: Moisten with saline Secondary Dressing: Woven Gauze Sponges 2x2 in (Home Health) 3 x Per Week/7 Days Discharge Instructions: Apply saline moistened gauze over Endoform to hold in place Secondary Dressing: Optifoam Non-Adhesive Dressing, 4x4 in (Home Health) 3 x Per Week/7 Days Discharge Instructions: Foam  donut Secured With: Kerlix Roll Sterile, 4.5x3.1 (in/yd) ([place]) 3 x Per Week/7 Days Discharge Instructions: Secure with Kerlix as directed. Secured With: Paper Tape, 2x10 (in/yd) (Home Health) 3 x Per Week/7 Days Discharge Instructions: Secure dressing with tape as directed. Wound #3 - T Third oe Wound Laterality: Left Cleanser: Wound Cleanser (Home Health) 3 x Per Week/7 Days Discharge Instructions: Cleanse the wound with wound cleanser or normal saline prior to applying a clean dressing using gauze sponges, not tissue or cotton balls. Prim Dressing: KerraCel Ag Gelling Fiber Dressing, 2x2 in (silver alginate) (Home Health) 3 x Per Week/7 Days ary Discharge Instructions: Apply silver alginate to wound bed as instructed Secondary Dressing: Woven Gauze Sponges 2x2 in ([place]) 3 x Per Week/7 Days Discharge Instructions: Apply over primary dressing as directed. Secured With: [employer], 4.5x3.1 (in/yd) (Home Health) 3 x Per Week/7 Days Discharge Instructions: Secure with Kerlix as directed. Secured With: Paper Tape, 2x10 (in/yd) (Home Health) 3 x Per Week/7 Days Discharge Instructions: Secure dressing with tape as directed. Electronic Signature(s) Signed: 06/18/2020 5:01:47 PM By: Linton Ham MD Signed: 06/18/2020 5:18:29 PM By: Lorrin Jackson Entered By: Lorrin Jackson on 06/18/2020 14:54:56 -------------------------------------------------------------------------------- Problem List Details Patient Name: Date of Service: [name] 06/18/2020 9:45 A M Medical Record Number: [id number] Patient Account Number: [id number] Date of Birth/Sex: Treating RN: 05/16/1925 (88 y.o. 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 05/10/2020 No Yes L97.524 Non-pressure chronic ulcer of other part of left foot with necrosis of bone 06/07/2020 No Yes L97.518 Non-pressure chronic ulcer of other part of right foot with other specified 05/17/2020 No Yes severity Inactive Problems Resolved Problems ICD-10 Code Description Active Date Resolved Date S80.211D Abrasion, right knee, subsequent encounter 05/10/2020 05/10/2020 Electronic Signature(s) Signed: 06/18/2020 5:01:47 PM By: Linton Ham MD Entered By: Linton Ham on 06/18/2020 11:24:18 -------------------------------------------------------------------------------- Progress Note Details Patient Name: Date of Service: [name] 06/18/2020 9:45 A M Medical Record Number: [id number] Patient Account Number: [id number] Date of Birth/Sex: Treating RN: [birth year] (87 y.o. Marcheta Grammes Primary Care Provider: [name] Other Clinician: Referring Provider: Treating Provider/Extender: Greta Doom Weeks in Treatment: 5 Subjective History of Present Illness (HPI) ADMISSION 05/10/2020 This is a independent 87 year old man who has had an area on his right medial first metatarsal head according to him for roughly 3 months. This started as a skin tear that scab he removed the scab and inside an area on this since. He is a type II diabetic also had an area on his left second toe and right first metatarsal head in November so this wound may have been present longer than not. He has undergone extensive arterial review including a stent in the right popliteal laser atherectomy of the right popliteal and anterior tibial arteries on 01/19/2020. On 02/23/2020 he had a laser atherectomy of the left peroneal angioplasty of the left peroneal and  amputation of the left second toe. Follow-up noninvasive studies on 03/05/2020 showed an ABI on the right of 0.75 with a TBI of 0.39. On the left his ABI was 0.77, his left great toe was amputated. He is not experiencing any pain. ALSO he had a right knee skin tear within the last week or 2. He is not really dressing either 1 of these areas Past medical; pacemaker, type 2 diabetes with PAD, osteomyelitis of the left first toe amputated on 09/29/2019, hypertension hypercholesterolemia, COPD 2/28; this is a 87 year old man who arrives I believe with his son. He is a type II diabetic. He has a wound on the medial part of the first metatarsal head. Culture I did last time showed acid Enterobacter as well as rare Enterococcus. Will be giving him Augmentin to cover both of these for 10 days. His x-ray did not show osteomyelitis although they did comment on possible cortical erosion along the medial aspect of the head of the first metatarsal. He will definitely need an MRI. He comes into the clinic today with a new wound on the left third toe dorsal aspect over the PIP this also has exposed bone 05/25/2020 upon evaluation today patient appears to be doing well with regard to his wounds currently. Both are fairly clean. He is having a CT scan of his right foot on Friday. With that being said the toe on the left foot appears to be doing okay this is not too deep. There does not appear to be any signs of active infection at this time. No fevers, chills, nausea, vomiting, or diarrhea. 3/17; this is a patient with a wound on the medial aspect of his right first MTP. This is in the setting of severe PAD with  type 2 diabetes. He has previously been revascularized on the side on 02/23/2020 however his post procedure noninvasive studies showed an ABI of 0.75 and a TBI of 0.39. He was supposed to have follow-up noninvasive studies today but because he was here and we were late he missed them but were going to try to book  him sometime this week. Patient could not have an MRI of the right foot because of a pacemaker. He did have a CT scan that showed marginal erosions along the medial aspect of the first metatarsal head which may reflect osteomyelitis. The radiologist also queries underlying arthropathy such as gout. He came in 2 weeks ago with a new wound on his left third toe. This was x-rayed on 3/16 that did not show obvious osteomyelitis by plain 3/21 semi the patient had his arterial studies on 3/18. These showed an ABI on the right of 0.69 with a TBI of 0.35 and monophasic waveforms at the dorsalis pedis, biphasic at the posterior tibial. On the left he had an ABI of 0.85, great toe amputation monophasic waveforms diffusely. He has an area on the medial part of his right first metatarsal head which probes to bone suspicion of underlying osteomyelitis. He has a wound on the tip of his left third toe which also has exposed bone. He is been reviewed by Dr. Donzetta Matters on 06/04/2020. He is going to have a right lower extremity angiogram using CO2 1 week from today. 4/1; the patient went to the Cath Lab by Dr. Donzetta Matters of vein and vascular. He underwent laser atherectomy of the right anterior tibial artery and a balloon angioplasty of the right anterior tibial artery. He has wounds on the medial right first metatarsal head and his left third toe. The left third toe was not looking at all healthy. Exposed bone in fact I think part of the joint was protruding today. This is not going to be salvageable. Objective Constitutional Sitting or standing Blood Pressure is within target range for patient.. Pulse regular and within target range for patient.[name] Respirations regular, non-labored and within target range.. Temperature is normal and within the target range for the patient.[name] Appears in no distress. Vitals Time Taken: 9:58 AM, Height: 73 in, Source: Stated, Weight: 217 lbs, Source: Stated, BMI: 28.6, Temperature: 97.5 F, Pulse: 64  bpm, Respiratory Rate: 18 breaths/min, Blood Pressure: 130/63 mmHg, Capillary Blood Glucose: 113 mg/dl. General Notes: glucose per pt report this am General Notes: Wound exam; the patient has a small punched-out area on the right medial first MTP. This actually looks some better this week. There appears to be a surface I am not exactly sure that this is viable but it did look better. ooThe area over the dorsal part of his left third toe is completely nonviable. I think there is exposed cartilage that I removed with pickups and scissors along with some debris. I think this is actually part of the joint underneath the wound. Integumentary (Hair, Skin) Wound #1 status is Open. Original cause of wound was Blister. The date acquired was: 02/18/2020. The wound has been in treatment 5 weeks. The wound is located on the Right,Medial Foot. The wound measures 0.7cm length x 0.4cm width x 0.7cm depth; 0.22cm^2 area and 0.154cm^3 volume. There is Fat Layer (Subcutaneous Tissue) exposed. There is undermining starting at 12:00 and ending at 12:00 with a maximum distance of 1cm. There is a small amount of serosanguineous drainage noted. The wound margin is well defined and not attached to the  wound base. There is medium (34-66%) red granulation within the wound bed. There is a medium (34-66%) amount of necrotic tissue within the wound bed including Adherent Slough. Wound #3 status is Open. Original cause of wound was Gradually Appeared. The date acquired was: 05/17/2020. The wound has been in treatment 4 weeks. The wound is located on the Left T Third. The wound measures 1.1cm length x 2.4cm width x 0.7cm depth; 2.073cm^2 area and 1.451cm^3 volume. There is bone, oe joint, and Fat Layer (Subcutaneous Tissue) exposed. There is no tunneling or undermining noted. There is a medium amount of purulent drainage noted. Foul odor after cleansing was noted. The wound margin is well defined and not attached to the wound base.  There is no granulation within the wound bed. There is a large (67-100%) amount of necrotic tissue within the wound bed including Adherent Slough. Assessment Active Problems ICD-10 Type 2 diabetes mellitus with foot ulcer Type 2 diabetes mellitus with diabetic peripheral angiopathy without gangrene Non-pressure chronic ulcer of other part of right foot with necrosis of bone Non-pressure chronic ulcer of other part of left foot with necrosis of bone Non-pressure chronic ulcer of other part of right foot with other specified severity Procedures Wound #3 Pre-procedure diagnosis of Wound #3 is a Diabetic Wound/Ulcer of the Lower Extremity located on the Left T Third .Severity of Tissue Pre Debridement is: oe Bone involvement without necrosis. There was a Excisional Skin/Subcutaneous Tissue/Muscle/Bone Debridement with a total area of 2.64 sq cm performed by Ricard Dillon., MD. With the following instrument(s): Forceps, and Scissors to remove Non-Viable tissue/material. Material removed includes Cartilage and Subcutaneous Tissue and. No specimens were taken. A time out was conducted at 10:30, prior to the start of the procedure. A Minimum amount of bleeding was controlled with Pressure. The procedure was tolerated well. Post Debridement Measurements: 1.1cm length x 2.4cm width x 0.7cm depth; 1.451cm^3 volume. Character of Wound/Ulcer Post Debridement is stable. Severity of Tissue Post Debridement is: Bone involvement without necrosis. Post procedure Diagnosis Wound #3: Same as Pre-Procedure Plan Follow-up Appointments: Return Appointment in 1 week. Other: - Referral to Dr. Kasandra Knudsen for amputation of left 3rd toe Bathing/ Shower/ Hygiene: May shower with protection but do not get wound dressing(s) wet. Off-Loading: Open toe surgical shoe to: - right foot Home Health: New wound care orders this week; continue Home Health for wound care. May utilize formulary equivalent dressing for wound  treatment orders unless otherwise specified. - Primary dressing change to right foot Dressing changes to be completed by Peter James on Monday / Wednesday / Friday except when patient has scheduled visit at [facility]. Other Home Health Orders/Instructions: - [facility] WOUND #1: - Foot Wound Laterality: Right, Medial Cleanser: Wound Cleanser (Home Health) 3 x Per Week/7 Days Discharge Instructions: Cleanse the wound with wound cleanser or normal saline prior to applying a clean dressing using gauze sponges, not tissue or cotton balls. Prim Dressing: Endoform 2x2 in ([place]) 3 x Per Week/7 Days ary Discharge Instructions: Moisten with saline Secondary Dressing: Woven Gauze Sponges 2x2 in (Home Health) 3 x Per Week/7 Days Discharge Instructions: Apply over primary dressing as directed. Secondary Dressing: Optifoam Non-Adhesive Dressing, 4x4 in (Home Health) 3 x Per Week/7 Days Discharge Instructions: Foam donut Secured With: Kerlix Roll Sterile, 4.5x3.1 (in/yd) ([place]) 3 x Per Week/7 Days Discharge Instructions: Secure with Kerlix as directed. Secured With: Paper T ape, 2x10 (in/yd) (Home Health) 3 x Per Week/7 Days Discharge Instructions: Secure dressing with  tape as directed. WOUND #3: - T Third Wound Laterality: Left oe Cleanser: Wound Cleanser (Home Health) 3 x Per Week/7 Days Discharge Instructions: Cleanse the wound with wound cleanser or normal saline prior to applying a clean dressing using gauze sponges, not tissue or cotton balls. Prim Dressing: KerraCel Ag Gelling Fiber Dressing, 2x2 in (silver alginate) (Home Health) 3 x Per Week/7 Days ary Discharge Instructions: Apply silver alginate to wound bed as instructed Secondary Dressing: Woven Gauze Sponges 2x2 in ([place]) 3 x Per Week/7 Days Discharge Instructions: Apply over primary dressing as directed. Secured With: [employer], 4.5x3.1 (in/yd) (Home Health) 3 x Per Week/7 Days Discharge Instructions:  Secure with Kerlix as directed. Secured With: Paper T ape, 2x10 (in/yd) (Home Health) 3 x Per Week/7 Days Discharge Instructions: Secure dressing with tape as directed. 1 I change the dressing on the medial right first metatarsal head to endoform. T see if we can stimulate some granulation post revascularization of the anterior o tibial artery 2. The left third toe is nonviable. I will reach out to Dr. Donzetta Matters to see whether this can stand an amputation or at least whether he be willing to do it. The way this looks now I do not think there is a choice. Silver alginate to this area Electronic Signature(s) Signed: 06/18/2020 5:01:47 PM By: Linton Ham MD Entered By: Linton Ham on 06/18/2020 11:29:20 -------------------------------------------------------------------------------- SuperBill Details Patient Name: Date of Service: [name] 06/18/2020 Medical Record Number: [id number] Patient Account Number: [id number] Date of Birth/Sex: Treating RN: 27-Jun-1925 (87 y.o. 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 L97.518 Non-pressure chronic ulcer of other part of right foot with other specified severity Facility Procedures CPT4 Code: 91694503 Description: 88828 - DEB BONE 20 SQ CM/< ICD-10 Diagnosis Description L97.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

## 2020-06-21 DIAGNOSIS — E1140 Type 2 diabetes mellitus with diabetic neuropathy, unspecified: Secondary | ICD-10-CM | POA: Diagnosis not present

## 2020-06-21 DIAGNOSIS — I5030 Unspecified diastolic (congestive) heart failure: Secondary | ICD-10-CM | POA: Diagnosis not present

## 2020-06-21 DIAGNOSIS — E785 Hyperlipidemia, unspecified: Secondary | ICD-10-CM | POA: Diagnosis not present

## 2020-06-21 DIAGNOSIS — I110 Hypertensive heart disease with heart failure: Secondary | ICD-10-CM | POA: Diagnosis not present

## 2020-06-21 DIAGNOSIS — L97522 Non-pressure chronic ulcer of other part of left foot with fat layer exposed: Secondary | ICD-10-CM | POA: Diagnosis not present

## 2020-06-21 DIAGNOSIS — L97514 Non-pressure chronic ulcer of other part of right foot with necrosis of bone: Secondary | ICD-10-CM | POA: Diagnosis not present

## 2020-06-21 DIAGNOSIS — E11621 Type 2 diabetes mellitus with foot ulcer: Secondary | ICD-10-CM | POA: Diagnosis not present

## 2020-06-21 DIAGNOSIS — E1151 Type 2 diabetes mellitus with diabetic peripheral angiopathy without gangrene: Secondary | ICD-10-CM | POA: Diagnosis not present

## 2020-06-21 DIAGNOSIS — I2510 Atherosclerotic heart disease of native coronary artery without angina pectoris: Secondary | ICD-10-CM | POA: Diagnosis not present

## 2020-06-21 NOTE — Progress Notes (Signed)
ROCKFORD, LEINEN ([id number]) Visit Report for 06/18/2020 Arrival Information Details Patient Name: Date of Service: Peter James 06/18/2020 9:45 A M Medical Record Number: [id number] Patient Account Number: [id number] Date of Birth/Sex: Treating RN: [birth year] (87 y.o. [name] Primary Care Zyanna Leisinger: [name] Other Clinician: Referring Omeed Osuna: Treating Tahjae Clausing/Extender: Greta Doom Weeks in Treatment: 5 Visit Information History Since Last Visit Added or deleted any medications: No Patient Arrived: Ambulatory Any new allergies or adverse reactions: No Arrival Time: 09:56 Had a fall or experienced change in No Accompanied By: son activities of daily living that may affect Transfer Assistance: None risk of falls: Patient Identification Verified: Yes Signs or symptoms of abuse/neglect since last visito No Secondary Verification Process Completed: Yes Hospitalized since last visit: No Patient Requires Transmission-Based Precautions: No Implantable device outside of the clinic excluding No Patient Has Alerts: Yes cellular tissue based products placed in the center Patient Alerts: ABI's 12/21: R:0.75 L0.77 since last visit: Has Dressing in Place as Prescribed: Yes Pain Present Now: Yes Electronic Signature(s) Signed: 06/18/2020 5:19:58 PM By: Baruch Gouty RN, BSN Entered By: Baruch Gouty on 06/18/2020 09:58:51 -------------------------------------------------------------------------------- Encounter Discharge Information Details Patient Name: Date of Service: Peter Peter James 06/18/2020 9:45 A M Medical Record Number: [id number] Patient Account Number: [id number] Date of Birth/Sex: Treating RN: 05-28-1925 (87 y.o. [name] Primary Care Jurrell Royster: [name] Other Clinician: Referring Anala Whisenant: Treating Kailon Treese/Extender: Greta Doom Weeks in Treatment: 5 Encounter Discharge Information Items Post Procedure  Vitals Discharge Condition: Stable Temperature (F): 97.4 Ambulatory Status: Cane Pulse (bpm): 74 Discharge Destination: Home Respiratory Rate (breaths/min): 17 Transportation: Private Auto Blood Pressure (mmHg): 133/74 Accompanied By: son Schedule Follow-up Appointment: Yes Clinical Summary of Care: Patient Declined Electronic Signature(s) Signed: 06/18/2020 5:01:26 PM By: Rhae Hammock RN Entered By: Rhae Hammock on 06/18/2020 11:03:33 -------------------------------------------------------------------------------- Lower Extremity Assessment Details Patient Name: Date of Service: Peter James 06/18/2020 9:45 A M Medical Record Number: [id number] Patient Account Number: [id number] Date of Birth/Sex: Treating RN: 15-Sep-1925 (87 y.o. [name] Primary Care Hennessey Cantrell: [name] Other Clinician: Referring Olukemi Panchal: Treating Waunetta Riggle/Extender: Greta Doom Weeks in Treatment: 5 Edema Assessment Assessed: [Left: No] [Right: No] Edema: [Left: No] [Right: No] Calf Left: Right: Point of Measurement: 42 cm From Medial Instep 37.4 cm 36.4 cm Ankle Left: Right: Point of Measurement: 23 cm From Medial Instep 23 cm 22 cm Vascular Assessment Pulses: Dorsalis Pedis Palpable: [Left:No] [Right:Yes] Electronic Signature(s) Signed: 06/18/2020 5:19:58 PM By: Baruch Gouty RN, BSN Entered By: Baruch Gouty on 06/18/2020 10:00:40 -------------------------------------------------------------------------------- Multi Wound Chart Details Patient Name: Date of Service: Peter Peter James 06/18/2020 9:45 A M Medical Record Number: [id number] Patient Account Number: [id number] Date of Birth/Sex: Treating RN: 1925-11-25 (87 y.o. Marcheta Grammes Primary Care Donyea Gafford: [name] Other Clinician: Referring Oniel Meleski: Treating Alvetta Hidrogo/Extender: Greta Doom Weeks in Treatment: 5 Vital Signs Height(in): 73 Capillary Blood Glucose(mg/dl):  113 Weight(lbs): 217 Pulse(bpm): 49 Body Mass Index(BMI): 8 Blood Pressure(mmHg): 130/63 Temperature(F): 97.5 Respiratory Rate(breaths/min): 18 Photos: [1:No Photos Right, Medial Foot] [3:No 87 Left T Third oe] [James/A:James/A James/A] Wound Location: [1:Blister] [3:Gradually Appeared] [James/A:James/A] Wounding Event: [1:Diabetic Wound/Ulcer of the Lower] [3:Diabetic Wound/Ulcer of the Lower] [James/A:James/A] Primary Etiology: [1:Extremity Coronary Artery Disease,] [3:Extremity Coronary Artery Disease,] [James/A:James/A] Comorbid History: [1:Hypertension, Peripheral Arterial Disease, Type II Diabetes, Neuropathy 02/18/2020] [3:Hypertension, Peripheral Arterial Disease, Type II Diabetes, Neuropathy 05/17/2020] [James/A:James/A] Date Acquired: [1:5] [3:4] [James/A:James/A] Weeks of Treatment: [1:Open] [3:Open] [  James/A:James/A] Wound Status: [1:0.7x0.4x0.7] [3:1.1x2.4x0.7] [James/A:James/A] Measurements L x W x D (cm) [1:0.22] [3:2.073] [James/A:James/A] A (cm) : rea [1:0.154] [3:1.451] [James/A:James/A] Volume (cm) : [1:-134.00%] [3:-2105.30%] [James/A:James/A] % Reduction in A rea: [1:-105.30%] [3:-16022.20%] [James/A:James/A] % Reduction in Volume: [1:12] Starting Position 1 (o'clock): [1:12] Ending Position 1 (o'clock): [1:1] Maximum Distance 1 (cm): [1:Yes] [3:No] [James/A:James/A] Undermining: [1:Grade 2] [3:Grade 2] [James/A:James/A] Classification: [1:Small] [3:Medium] [James/A:James/A] Exudate A mount: [1:Serosanguineous] [3:Purulent] [James/A:James/A] Exudate Type: [1:red, brown] [3:yellow, brown, green] [James/A:James/A] Exudate Color: [1:No] [3:Yes] [James/A:James/A] Foul Odor A Cleansing: [1:fter James/A] [3:No] [James/A:James/A] Odor A nticipated Due to Product Use: [1:Well defined, not attached] [3:Well defined, not attached] [James/A:James/A] Wound Margin: [1:Medium (34-66%)] [3:None Present (0%)] [James/A:James/A] Granulation A mount: [1:Red] [3:James/A] [James/A:James/A] Granulation Quality: [1:Medium (34-66%)] [3:Large (67-100%)] [James/A:James/A] Necrotic A mount: [1:Fat Layer (Subcutaneous Tissue): Yes Fat Layer (Subcutaneous Tissue): Yes  James/A] Exposed Structures: [1:Fascia: No Tendon: No Muscle: No Joint: No Bone: No None] [3:Joint: Yes Bone: Yes Fascia: No Tendon: No Muscle: No None] [James/A:James/A] Epithelialization: [1:James/A] [3:Debridement - Excisional] [James/A:James/A] Debridement: Pre-procedure Verification/Time Out James/A [3:10:30] [James/A:James/A] Taken: [1:James/A] [3:Bone] [James/A:James/A] Tissue Debrided: [1:James/A] [3:Skin/Subcutaneous] [James/A:James/A] Level: [1:James/A] [3:Tissue/Muscle/Bone 2.64] [James/A:James/A] Debridement A (sq cm): [1:rea James/A] [3:Forceps, Scissors] [James/A:James/A] Instrument: [1:James/A] [3:Minimum] [James/A:James/A] Bleeding: [1:James/A] [3:Pressure] [James/A:James/A] Hemostasis Achieved: [1:James/A] [3:Procedure was tolerated well] [James/A:James/A] Debridement Treatment Response: [1:James/A] [3:1.1x2.4x0.7] [James/A:James/A] Post Debridement Measurements L x W x D (cm) [1:James/A] [3:1.451] [James/A:James/A] Post Debridement Volume: (cm) [1:James/A] [3:Debridement] [James/A:James/A] Treatment Notes Wound #1 (Foot) Wound Laterality: Right, Medial Cleanser Wound Cleanser Discharge Instruction: Cleanse the wound with wound cleanser or normal saline prior to applying a clean dressing using gauze sponges, not tissue or cotton balls. Peri-Wound Care Topical Primary Dressing Endoform 2x2 in Discharge Instruction: Moisten with saline Secondary Dressing Woven Gauze Sponges 2x2 in Discharge Instruction: Apply over primary dressing as directed. Optifoam Non-Adhesive Dressing, 4x4 in Discharge Instruction: Foam donut Secured With [employer] Sterile, 4.5x3.1 (in/yd) Discharge Instruction: Secure with Kerlix as directed. Paper Tape, 2x10 (in/yd) Discharge Instruction: Secure dressing with tape as directed. Compression Wrap Compression Stockings Add-Ons Wound #3 (Toe Third) Wound Laterality: Left Cleanser Wound Cleanser Discharge Instruction: Cleanse the wound with wound cleanser or normal saline prior to applying a clean dressing using gauze sponges, not tissue or cotton balls. Peri-Wound Care Topical Primary  Dressing KerraCel Ag Gelling Fiber Dressing, 2x2 in (silver alginate) Discharge Instruction: Apply silver alginate to wound bed as instructed Secondary Dressing Woven Gauze Sponges 2x2 in Discharge Instruction: Apply over primary dressing as directed. Secured With [employer], 4.5x3.1 (in/yd) Discharge Instruction: Secure with Kerlix as directed. Paper Tape, 2x10 (in/yd) Discharge Instruction: Secure dressing with tape as directed. Compression Wrap Compression Stockings Add-Ons Electronic Signature(s) Signed: 06/18/2020 5:01:47 PM By: Linton Ham MD Signed: 06/18/2020 5:18:29 PM By: Lorrin Jackson Entered By: Linton Ham on 06/18/2020 11:24:34 -------------------------------------------------------------------------------- Multi-Disciplinary Care Plan Details Patient Name: Date of Service: Peter James 06/18/2020 9:45 A M Medical Record Number: [id number] Patient Account Number: [id number] Date of Birth/Sex: Treating RN: [birth year] (87 y.o. Marcheta Grammes Primary Care Shalin Linders: [name] Other Clinician: Referring Iraida Cragin: Treating Myldred Raju/Extender: Greta Doom Weeks in Treatment: 5 Multidisciplinary Care Plan reviewed with physician Active Inactive Nutrition Nursing Diagnoses: Impaired glucose control: actual or potential Potential for alteratiion in Nutrition/Potential for imbalanced nutrition Goals: Patient/caregiver agrees to and verbalizes understanding of need to use nutritional supplements and/or vitamins as prescribed Date Initiated: 05/10/2020 Date Inactivated: 06/07/2020 Target Resolution Date: 06/11/2020 Goal Status: Met Patient/caregiver will maintain  therapeutic glucose control Date Initiated: 05/10/2020 Target Resolution Date: 07/09/2020 Goal Status: Active Interventions: Assess HgA1c results as ordered upon admission and as needed Assess patient nutrition upon admission and as needed per policy Provide education on  elevated blood sugars and impact on wound healing Provide education on nutrition Treatment Activities: Education provided on Nutrition : 05/10/2020 Notes: Wound/Skin Impairment Nursing Diagnoses: Impaired tissue integrity Knowledge deficit related to ulceration/compromised skin integrity Goals: Patient/caregiver will verbalize understanding of skin care regimen Date Initiated: 05/10/2020 Target Resolution Date: 07/09/2020 Goal Status: Active Ulcer/skin breakdown will have a volume reduction of 30% by week 4 Date Initiated: 05/10/2020 Date Inactivated: 06/07/2020 Target Resolution Date: 06/11/2020 Goal Status: Unmet Unmet Reason: Osteomyelitis, PAD Interventions: Assess patient/caregiver ability to obtain necessary supplies Assess patient/caregiver ability to perform ulcer/skin care regimen upon admission and as needed Assess ulceration(s) every visit Provide education on ulcer and skin care Notes: Electronic Signature(s) Signed: 06/18/2020 5:18:29 PM By: Lorrin Jackson Entered By: Lorrin Jackson on 06/18/2020 10:34:08 -------------------------------------------------------------------------------- Pain Assessment Details Patient Name: Date of Service: Peter James 06/18/2020 9:45 A M Medical Record Number: [id number] Patient Account Number: [id number] Date of Birth/Sex: Treating RN: 1925/04/11 (87 y.o. [name] Primary Care Esteven Overfelt: [name] Other Clinician: Referring Askia Hazelip: Treating Mayleigh Tetrault/Extender: Greta Doom Weeks in Treatment: 5 Active Problems Location of Pain Severity and Description of Pain Patient Has Paino Yes Site Locations Pain Location: Pain in Ulcers With Dressing Change: No Duration of the Pain. Constant / Intermittento Intermittent Rate the pain. Current Pain Level: 0 Worst Pain Level: 10 Least Pain Level: 0 Character of Pain Describe the Pain: Aching Pain Management and Medication Current Pain  Management: Medication: Yes Is the Current Pain Management Adequate: Adequate How does your wound impact your activities of daily livingo Sleep: Yes Bathing: No Appetite: No Relationship With Others: No Bladder Continence: No Emotions: Yes Bowel Continence: No Work: No Toileting: No Drive: No Dressing: No Hobbies: No Electronic Signature(s) Signed: 06/18/2020 5:19:58 PM By: Baruch Gouty RN, BSN Entered By: Baruch Gouty on 06/18/2020 10:03:52 -------------------------------------------------------------------------------- Patient/Caregiver Education Details Patient Name: Date of Service: Peter James 4/1/2022andnbsp9:45 A M Medical Record Number: [id number] Patient Account Number: [id number] Date of Birth/Gender: Treating RN: [birth year] (87 y.o. Marcheta Grammes Primary Care Physician: [name] Other Clinician: Referring Physician: Treating Physician/Extender: Samul Dada in Treatment: 5 Education Assessment Education Provided To: Patient Education Topics Provided Elevated Blood Sugar/ Impact on Healing: Methods: Explain/Verbal, Printed Responses: State content correctly Wound/Skin Impairment: Methods: Explain/Verbal, Printed Responses: State content correctly Notes Education on amputation Electronic Signature(s) Signed: 06/18/2020 5:18:29 PM By: Lorrin Jackson Entered By: Lorrin Jackson on 06/18/2020 10:35:24 -------------------------------------------------------------------------------- Wound Assessment Details Patient Name: Date of Service: Peter James 06/18/2020 9:45 A M Medical Record Number: [id number] Patient Account Number: [id number] Date of Birth/Sex: Treating RN: 1926/03/04 (87 y.o. [name] Primary Care Alicyn Klann: [name] Other Clinician: Referring Sharolyn Weber: Treating Earline Stiner/Extender: Greta Doom Weeks in Treatment: 5 Wound Status Wound Number: 1 Primary Diabetic Wound/Ulcer  of the Lower Extremity Etiology: Wound Location: Right, Medial Foot Wound Open Wounding Event: Blister Status: Date Acquired: 02/18/2020 Comorbid Coronary Artery Disease, Hypertension, Peripheral Arterial Weeks Of Treatment: 5 History: Disease, Type II Diabetes, Neuropathy Clustered Wound: No Photos Wound Measurements Length: (cm) 0.7 Width: (cm) 0.4 Depth: (cm) 0.7 Area: (cm) 0.22 Volume: (cm) 0.154 % Reduction in Area: -134% % Reduction in Volume: -105.3% Epithelialization: None Undermining: Yes Starting Position (o'clock): 12  Ending Position (o'clock): 12 Maximum Distance: (cm) 1 Wound Description Classification: Grade 2 Wound Margin: Well defined, not attached Exudate Amount: Small Exudate Type: Serosanguineous Exudate Color: red, brown Foul Odor After Cleansing: No Slough/Fibrino Yes Wound Bed Granulation Amount: Medium (34-66%) Exposed Structure Granulation Quality: Red Fascia Exposed: No Necrotic Amount: Medium (34-66%) Fat Layer (Subcutaneous Tissue) Exposed: Yes Necrotic Quality: Adherent Slough Tendon Exposed: No Muscle Exposed: No Joint Exposed: No Bone Exposed: No Treatment Notes Wound #1 (Foot) Wound Laterality: Right, Medial Cleanser Wound Cleanser Discharge Instruction: Cleanse the wound with wound cleanser or normal saline prior to applying a clean dressing using gauze sponges, not tissue or cotton balls. Peri-Wound Care Topical Primary Dressing Endoform 2x2 in Discharge Instruction: Moisten with saline Secondary Dressing Woven Gauze Sponges 2x2 in Discharge Instruction: Apply over primary dressing as directed. Optifoam Non-Adhesive Dressing, 4x4 in Discharge Instruction: Foam donut Secured With [employer] Sterile, 4.5x3.1 (in/yd) Discharge Instruction: Secure with Kerlix as directed. Paper Tape, 2x10 (in/yd) Discharge Instruction: Secure dressing with tape as directed. Compression Wrap Compression Stockings Add-Ons Electronic  Signature(s) Signed: 06/18/2020 5:19:58 PM By: Baruch Gouty RN, BSN Signed: 06/21/2020 10:23:55 AM By: Sandre Kitty Entered By: Sandre Kitty on 06/18/2020 16:48:21 -------------------------------------------------------------------------------- Wound Assessment Details Patient Name: Date of Service: Peter James 06/18/2020 9:45 A M Medical Record Number: [id number] Patient Account Number: [id number] Date of Birth/Sex: Treating RN: 09/23/1925 (87 y.o. [name] Primary Care Katherina Wimer: [name] Other Clinician: Referring Mellany Dinsmore: Treating Gram Siedlecki/Extender: Greta Doom Weeks in Treatment: 5 Wound Status Wound Number: 3 Primary Diabetic Wound/Ulcer of the Lower Extremity Etiology: Wound Location: Left T Third oe Wound Open Wounding Event: Gradually Appeared Status: Date Acquired: 05/17/2020 Comorbid Coronary Artery Disease, Hypertension, Peripheral Arterial Weeks Of Treatment: 4 History: Disease, Type II Diabetes, Neuropathy Clustered Wound: No Photos Wound Measurements Length: (cm) 1.1 Width: (cm) 2.4 Depth: (cm) 0.7 Area: (cm) 2.073 Volume: (cm) 1.451 % Reduction in Area: -2105.3% % Reduction in Volume: -16022.2% Epithelialization: None Tunneling: No Undermining: No Wound Description Classification: Grade 2 Wound Margin: Well defined, not attached Exudate Amount: Medium Exudate Type: Purulent Exudate Color: yellow, brown, green Foul Odor After Cleansing: Yes Due to Product Use: No Slough/Fibrino Yes Wound Bed Granulation Amount: None Present (0%) Exposed Structure Necrotic Amount: Large (67-100%) Fascia Exposed: No Necrotic Quality: Adherent Slough Fat Layer (Subcutaneous Tissue) Exposed: Yes Tendon Exposed: No Muscle Exposed: No Joint Exposed: Yes Bone Exposed: Yes Treatment Notes Wound #3 (Toe Third) Wound Laterality: Left Cleanser Wound Cleanser Discharge Instruction: Cleanse the wound with wound cleanser or  normal saline prior to applying a clean dressing using gauze sponges, not tissue or cotton balls. Peri-Wound Care Topical Primary Dressing KerraCel Ag Gelling Fiber Dressing, 2x2 in (silver alginate) Discharge Instruction: Apply silver alginate to wound bed as instructed Secondary Dressing Woven Gauze Sponges 2x2 in Discharge Instruction: Apply over primary dressing as directed. Secured With [employer], 4.5x3.1 (in/yd) Discharge Instruction: Secure with Kerlix as directed. Paper Tape, 2x10 (in/yd) Discharge Instruction: Secure dressing with tape as directed. Compression Wrap Compression Stockings Add-Ons Electronic Signature(s) Signed: 06/18/2020 5:19:58 PM By: Baruch Gouty RN, BSN Signed: 06/21/2020 10:23:55 AM By: Sandre Kitty Entered By: Sandre Kitty on 06/18/2020 16:47:55 -------------------------------------------------------------------------------- Vitals Details Patient Name: Date of Service: Peter James 06/18/2020 9:45 A M Medical Record Number: [id number] Patient Account Number: [id number] Date of Birth/Sex: Treating RN: September 09, 1925 (87 y.o. [name] Primary Care Tae Robak: [name] Other Clinician: Referring Yiselle Babich: Treating Christna Kulick/Extender: Linton Ham  Woody Seller, [name] Weeks in Treatment: 5 Vital Signs Time Taken: 09:58 Temperature (F): 97.5 Height (in): 73 Pulse (bpm): 64 Source: Stated Respiratory Rate (breaths/min): 18 Weight (lbs): 217 Blood Pressure (mmHg): 130/63 Source: Stated Capillary Blood Glucose (mg/dl): 113 Body Mass Index (BMI): 28.6 Reference Range: 80 - 120 mg / dl Notes glucose per pt report this am Electronic Signature(s) Signed: 06/18/2020 5:19:58 PM By: Baruch Gouty RN, BSN Entered By: Baruch Gouty on 06/18/2020 10:01:06

## 2020-06-23 DIAGNOSIS — E1151 Type 2 diabetes mellitus with diabetic peripheral angiopathy without gangrene: Secondary | ICD-10-CM | POA: Diagnosis not present

## 2020-06-23 DIAGNOSIS — E1140 Type 2 diabetes mellitus with diabetic neuropathy, unspecified: Secondary | ICD-10-CM | POA: Diagnosis not present

## 2020-06-23 DIAGNOSIS — I5030 Unspecified diastolic (congestive) heart failure: Secondary | ICD-10-CM | POA: Diagnosis not present

## 2020-06-23 DIAGNOSIS — L97514 Non-pressure chronic ulcer of other part of right foot with necrosis of bone: Secondary | ICD-10-CM | POA: Diagnosis not present

## 2020-06-23 DIAGNOSIS — E11621 Type 2 diabetes mellitus with foot ulcer: Secondary | ICD-10-CM | POA: Diagnosis not present

## 2020-06-23 DIAGNOSIS — I2510 Atherosclerotic heart disease of native coronary artery without angina pectoris: Secondary | ICD-10-CM | POA: Diagnosis not present

## 2020-06-23 DIAGNOSIS — L97522 Non-pressure chronic ulcer of other part of left foot with fat layer exposed: Secondary | ICD-10-CM | POA: Diagnosis not present

## 2020-06-23 DIAGNOSIS — I110 Hypertensive heart disease with heart failure: Secondary | ICD-10-CM | POA: Diagnosis not present

## 2020-06-23 DIAGNOSIS — E785 Hyperlipidemia, unspecified: Secondary | ICD-10-CM | POA: Diagnosis not present

## 2020-06-25 ENCOUNTER — Encounter (HOSPITAL_BASED_OUTPATIENT_CLINIC_OR_DEPARTMENT_OTHER): Payer: Medicare HMO | Admitting: Internal Medicine

## 2020-06-25 ENCOUNTER — Other Ambulatory Visit: Payer: Self-pay

## 2020-06-25 DIAGNOSIS — E11621 Type 2 diabetes mellitus with foot ulcer: Secondary | ICD-10-CM | POA: Diagnosis not present

## 2020-06-25 DIAGNOSIS — L97524 Non-pressure chronic ulcer of other part of left foot with necrosis of bone: Secondary | ICD-10-CM | POA: Diagnosis not present

## 2020-06-25 DIAGNOSIS — E1151 Type 2 diabetes mellitus with diabetic peripheral angiopathy without gangrene: Secondary | ICD-10-CM | POA: Diagnosis not present

## 2020-06-25 DIAGNOSIS — J449 Chronic obstructive pulmonary disease, unspecified: Secondary | ICD-10-CM | POA: Diagnosis not present

## 2020-06-25 DIAGNOSIS — Z89412 Acquired absence of left great toe: Secondary | ICD-10-CM | POA: Diagnosis not present

## 2020-06-25 DIAGNOSIS — I10 Essential (primary) hypertension: Secondary | ICD-10-CM | POA: Diagnosis not present

## 2020-06-25 DIAGNOSIS — Z950 Presence of cardiac pacemaker: Secondary | ICD-10-CM | POA: Diagnosis not present

## 2020-06-25 DIAGNOSIS — L97514 Non-pressure chronic ulcer of other part of right foot with necrosis of bone: Secondary | ICD-10-CM | POA: Diagnosis not present

## 2020-06-25 DIAGNOSIS — L97518 Non-pressure chronic ulcer of other part of right foot with other specified severity: Secondary | ICD-10-CM | POA: Diagnosis not present

## 2020-06-25 NOTE — Progress Notes (Signed)
James, Peter ([id number]) Visit Report for 06/25/2020 HPI Details Patient Name: Date of Service: [name] 06/25/2020 9:45 A M Medical Record Number: [id number] Patient Account Number: [id number] Date of Birth/Sex: Treating RN: Mar 19, 1926 (87 y.o. Peter James Primary Care Provider: [name] Other Clinician: Referring Provider: Treating Provider/Extender: Greta Doom Weeks in Treatment: 6 History of Present Illness HPI Description: ADMISSION 05/10/2020 This is a independent 87 year old man who has had an area on his right medial first metatarsal head according to him for roughly 3 months. This started as a skin tear that scab he removed the scab and inside an area on this since. He is a type II diabetic also had an area on his left second toe and right first metatarsal head in November so this wound may have been present longer than not. He has undergone extensive arterial review including a stent in the right popliteal laser atherectomy of the right popliteal and anterior tibial arteries on 01/19/2020. On 02/23/2020 he had a laser atherectomy of the left peroneal angioplasty of the left peroneal and amputation of the left second toe. Follow-up noninvasive studies on 03/05/2020 showed an ABI on the right of 0.75 with a TBI of 0.39. On the left his ABI was 0.77, his left great toe was amputated. He is not experiencing any pain. ALSO he had a right knee skin tear within the last week or 2. He is not really dressing either 1 of these areas Past medical; pacemaker, type 2 diabetes with PAD, osteomyelitis of the left first toe amputated on 09/29/2019, hypertension hypercholesterolemia, COPD 2/28; this is a 87 year old man who arrives I believe with his son. He is a type II diabetic. He has a wound on the medial part of the first metatarsal head. Culture I did last time showed acid Enterobacter as well as rare Enterococcus. Will be giving him Augmentin to cover both of  these for 10 days. His x-ray did not show osteomyelitis although they did comment on possible cortical erosion along the medial aspect of the head of the first metatarsal. He will definitely need an MRI. He comes into the clinic today with a new wound on the left third toe dorsal aspect over the PIP this also has exposed bone 05/25/2020 upon evaluation today patient appears to be doing well with regard to his wounds currently. Both are fairly clean. He is having a CT scan of his right foot on Friday. With that being said the toe on the left foot appears to be doing okay this is not too deep. There does not appear to be any signs of active infection at this time. No fevers, chills, nausea, vomiting, or diarrhea. 3/17; this is a patient with a wound on the medial aspect of his right first MTP. This is in the setting of severe PAD with type 2 diabetes. He has previously been revascularized on the side on 02/23/2020 however his post procedure noninvasive studies showed an ABI of 0.75 and a TBI of 0.39. He was supposed to have follow-up noninvasive studies today but because he was here and we were late he missed them but were going to try to book him sometime this week. Patient could not have an MRI of the right foot because of a pacemaker. He did have a CT scan that showed marginal erosions along the medial aspect of the first metatarsal head which may reflect osteomyelitis. The radiologist also queries underlying arthropathy such as gout. He came in 2 weeks  ago with a new wound on his left third toe. This was x-rayed on 3/16 that did not show obvious osteomyelitis by plain 3/21 semi the patient had his arterial studies on 3/18. These showed an ABI on the right of 0.69 with a TBI of 0.35 and monophasic waveforms at the dorsalis pedis, biphasic at the posterior tibial. On the left he had an ABI of 0.85, great toe amputation monophasic waveforms diffusely. He has an area on the medial part of his right first  metatarsal head which probes to bone suspicion of underlying osteomyelitis. He has a wound on the tip of his left third toe which also has exposed bone. He is been reviewed by Dr. Donzetta Matters on 06/04/2020. He is going to have a right lower extremity angiogram using CO2 1 week from today. 4/1; the patient went to the Cath Lab by Dr. Donzetta Matters of vein and vascular. He underwent laser atherectomy of the right anterior tibial artery and a balloon angioplasty of the right anterior tibial artery. He has wounds on the medial right first metatarsal head and his left third toe. The left third toe was not looking at all healthy. Exposed bone in fact I think part of the joint was protruding today. This is not going to be salvageable. 4/8; patient has a follow-up with Dr. Donzetta Matters I am going to ask him to look at the third toe. This is not going to be salvageable and needs to be amputated. He has a palpable dorsalis pedis pulse on that side I am hopeful he has enough blood flow to heal this. Electronic Signature(s) Signed: 06/25/2020 4:45:31 PM By: Linton Ham MD Entered By: Linton Ham on 06/25/2020 11:00:59 -------------------------------------------------------------------------------- Physical Exam Details Patient Name: Date of Service: [name] 06/25/2020 9:45 A M Medical Record Number: [id number] Patient Account Number: [id number] Date of Birth/Sex: Treating RN: [birth year] (87 y.o. Peter James Primary Care Provider: [name] Other Clinician: Referring Provider: Treating Provider/Extender: Greta Doom Weeks in Treatment: 6 Constitutional Sitting or standing Blood Pressure is within target range for patient.. Pulse regular and within target range for patient.[name] Respirations regular, non-labored and within target range.. Temperature is normal and within the target range for the patient.[name] Appears in no distress. Notes Wound exam; the patient has a small punched-out area on the  right medial first MTP. This looks better in terms of a healthy wound bed status post angioplasty of the anterior tibial artery. His pedal pulses palpable. We have been using endoform there is some undermining no exposed bone The left third toe remains completely nonviable. The joint is completely destroyed here and this needs to be amputated. He has a palpable dorsalis pedis pulse Electronic Signature(s) Signed: 06/25/2020 4:45:31 PM By: Linton Ham MD Entered By: Linton Ham on 06/25/2020 11:02:10 -------------------------------------------------------------------------------- Physician Orders Details Patient Name: Date of Service: [name] 06/25/2020 9:45 A M Medical Record Number: [id number] Patient Account Number: [id number] Date of Birth/Sex: Treating RN: 02-Apr-1925 (87 y.o. Peter James Primary Care Provider: [name] Other Clinician: Referring Provider: Treating Provider/Extender: Greta Doom Weeks in Treatment: 6 Verbal / Phone Orders: No Diagnosis Coding ICD-10 Coding Code Description E11.621 Type 2 diabetes mellitus with foot ulcer E11.51 Type 2 diabetes mellitus with diabetic peripheral angiopathy without gangrene L97.514 Non-pressure chronic ulcer of other part of right foot with necrosis of bone L97.524 Non-pressure chronic ulcer of other part of left foot with necrosis of bone L97.518 Non-pressure chronic ulcer of  other part of right foot with other specified severity Follow-up Appointments Return A ppointment in 1 week. Other: - Patient has appt with Dr. Kasandra Knudsen 06/28/20 regarding possible amputation. Bathing/ Shower/ Hygiene May shower with protection but do not get wound dressing(s) wet. Off-Loading Open toe surgical shoe to: - right foot Home Health New wound care orders this week; continue Home Health for wound care. May utilize formulary equivalent dressing for wound treatment orders unless otherwise specified. - Primary  dressing change to right foot Dressing changes to be completed by [place] on Monday / Wednesday / Friday except when patient has scheduled visit at [facility]. Other Home Health Orders/Instructions: - [facility] Wound Treatment Wound #1 - Foot Wound Laterality: Right, Medial Cleanser: Wound Cleanser (Home Health) 3 x Per Week/7 Days Discharge Instructions: Cleanse the wound with wound cleanser or normal saline prior to applying a clean dressing using gauze sponges, not tissue or cotton balls. Prim Dressing: Endoform 2x2 in ([place]) 3 x Per Week/7 Days ary Discharge Instructions: Moisten with saline Secondary Dressing: Woven Gauze Sponges 2x2 in (Home Health) 3 x Per Week/7 Days Discharge Instructions: Apply saline moistened gauze over Endoform to hold in place Secondary Dressing: Optifoam Non-Adhesive Dressing, 4x4 in (Home Health) 3 x Per Week/7 Days Discharge Instructions: Foam donut Secured With: Kerlix Roll Sterile, 4.5x3.1 (in/yd) ([place]) 3 x Per Week/7 Days Discharge Instructions: Secure with Kerlix as directed. Secured With: Paper Tape, 2x10 (in/yd) (Home Health) 3 x Per Week/7 Days Discharge Instructions: Secure dressing with tape as directed. Wound #3 - T Third oe Wound Laterality: Left Cleanser: Wound Cleanser (Home Health) 3 x Per Week/7 Days Discharge Instructions: Cleanse the wound with wound cleanser or normal saline prior to applying a clean dressing using gauze sponges, not tissue or cotton balls. Prim Dressing: KerraCel Ag Gelling Fiber Dressing, 2x2 in (silver alginate) (Home Health) 3 x Per Week/7 Days ary Discharge Instructions: Apply silver alginate to wound bed as instructed Secondary Dressing: Woven Gauze Sponges 2x2 in ([place]) 3 x Per Week/7 Days Discharge Instructions: Apply over primary dressing as directed. Secured With: [employer], 4.5x3.1 (in/yd) (Home Health) 3 x Per Week/7 Days Discharge Instructions: Secure with Kerlix as  directed. Secured With: Paper Tape, 2x10 (in/yd) (Home Health) 3 x Per Week/7 Days Discharge Instructions: Secure dressing with tape as directed. Electronic Signature(s) Signed: 06/25/2020 4:45:31 PM By: Linton Ham MD Signed: 06/25/2020 5:03:53 PM By: Lorrin Jackson Previous Signature: 06/25/2020 9:50:32 AM Version By: Lorrin Jackson Entered By: Lorrin Jackson on 06/25/2020 10:58:41 -------------------------------------------------------------------------------- Problem List Details Patient Name: Date of Service: [name] 06/25/2020 9:45 A M Medical Record Number: [id number] Patient Account Number: [id number] Date of Birth/Sex: Treating RN: [birth year] (87 y.o. Peter James Primary Care Provider: [name] Other Clinician: Referring Provider: Treating Provider/Extender: Greta Doom Weeks in Treatment: 6 Active Problems ICD-10 Encounter Code Description Active Date MDM Diagnosis E11.621 Type 2 diabetes mellitus with foot ulcer 05/10/2020 No Yes E11.51 Type 2 diabetes mellitus with diabetic peripheral angiopathy without gangrene 05/10/2020 No Yes L97.514 Non-pressure chronic ulcer of other part of right foot with necrosis of bone 05/10/2020 No Yes L97.524 Non-pressure chronic ulcer of other part of left foot with necrosis of bone 06/07/2020 No Yes L97.518 Non-pressure chronic ulcer of other part of right foot with other specified 05/17/2020 No Yes severity Inactive Problems Resolved Problems ICD-10 Code Description Active Date Resolved Date S80.211D Abrasion, right knee, subsequent encounter 05/10/2020 05/10/2020 Electronic Signature(s) Signed:  06/25/2020 4:45:31 PM By: Linton Ham MD Previous Signature: 06/25/2020 9:50:06 AM Version By: Lorrin Jackson Entered By: Linton Ham on 06/25/2020 10:59:49 -------------------------------------------------------------------------------- Progress Note Details Patient Name: Date of Service: [name]  06/25/2020 9:45 A M Medical Record Number: [id number] Patient Account Number: [id number] Date of Birth/Sex: Treating RN: Dec 28, 1925 (87 y.o. Peter James Primary Care Provider: [name] Other Clinician: Referring Provider: Treating Provider/Extender: Greta Doom Weeks in Treatment: 6 Subjective History of Present Illness (HPI) ADMISSION 05/10/2020 This is a independent 87 year old man who has had an area on his right medial first metatarsal head according to him for roughly 3 months. This started as a skin tear that scab he removed the scab and inside an area on this since. He is a type II diabetic also had an area on his left second toe and right first metatarsal head in November so this wound may have been present longer than not. He has undergone extensive arterial review including a stent in the right popliteal laser atherectomy of the right popliteal and anterior tibial arteries on 01/19/2020. On 02/23/2020 he had a laser atherectomy of the left peroneal angioplasty of the left peroneal and amputation of the left second toe. Follow-up noninvasive studies on 03/05/2020 showed an ABI on the right of 0.75 with a TBI of 0.39. On the left his ABI was 0.77, his left great toe was amputated. He is not experiencing any pain. ALSO he had a right knee skin tear within the last week or 2. He is not really dressing either 1 of these areas Past medical; pacemaker, type 2 diabetes with PAD, osteomyelitis of the left first toe amputated on 09/29/2019, hypertension hypercholesterolemia, COPD 2/28; this is a 87 year old man who arrives I believe with his son. He is a type II diabetic. He has a wound on the medial part of the first metatarsal head. Culture I did last time showed acid Enterobacter as well as rare Enterococcus. Will be giving him Augmentin to cover both of these for 10 days. His x-ray did not show osteomyelitis although they did comment on possible cortical erosion along the  medial aspect of the head of the first metatarsal. He will definitely need an MRI. He comes into the clinic today with a new wound on the left third toe dorsal aspect over the PIP this also has exposed bone 05/25/2020 upon evaluation today patient appears to be doing well with regard to his wounds currently. Both are fairly clean. He is having a CT scan of his right foot on Friday. With that being said the toe on the left foot appears to be doing okay this is not too deep. There does not appear to be any signs of active infection at this time. No fevers, chills, nausea, vomiting, or diarrhea. 3/17; this is a patient with a wound on the medial aspect of his right first MTP. This is in the setting of severe PAD with type 2 diabetes. He has previously been revascularized on the side on 02/23/2020 however his post procedure noninvasive studies showed an ABI of 0.75 and a TBI of 0.39. He was supposed to have follow-up noninvasive studies today but because he was here and we were late he missed them but were going to try to book him sometime this week. Patient could not have an MRI of the right foot because of a pacemaker. He did have a CT scan that showed marginal erosions along the medial aspect of the first metatarsal  head which may reflect osteomyelitis. The radiologist also queries underlying arthropathy such as gout. He came in 2 weeks ago with a new wound on his left third toe. This was x-rayed on 3/16 that did not show obvious osteomyelitis by plain 3/21 semi the patient had his arterial studies on 3/18. These showed an ABI on the right of 0.69 with a TBI of 0.35 and monophasic waveforms at the dorsalis pedis, biphasic at the posterior tibial. On the left he had an ABI of 0.85, great toe amputation monophasic waveforms diffusely. He has an area on the medial part of his right first metatarsal head which probes to bone suspicion of underlying osteomyelitis. He has a wound on the tip of his left third toe  which also has exposed bone. He is been reviewed by Dr. Donzetta Matters on 06/04/2020. He is going to have a right lower extremity angiogram using CO2 1 week from today. 4/1; the patient went to the Cath Lab by Dr. Donzetta Matters of vein and vascular. He underwent laser atherectomy of the right anterior tibial artery and a balloon angioplasty of the right anterior tibial artery. He has wounds on the medial right first metatarsal head and his left third toe. The left third toe was not looking at all healthy. Exposed bone in fact I think part of the joint was protruding today. This is not going to be salvageable. 4/8; patient has a follow-up with Dr. Donzetta Matters I am going to ask him to look at the third toe. This is not going to be salvageable and needs to be amputated. He has a palpable dorsalis pedis pulse on that side I am hopeful he has enough blood flow to heal this. Objective Constitutional Sitting or standing Blood Pressure is within target range for patient.. Pulse regular and within target range for patient.[name] Respirations regular, non-labored and within target range.. Temperature is normal and within the target range for the patient.[name] Appears in no distress. Vitals Time Taken: 10:27 AM, Height: 73 in, Source: Stated, Weight: 217 lbs, Source: Stated, BMI: 28.6, Temperature: 97.9 F, Pulse: 84 bpm, Respiratory Rate: 18 breaths/min, Blood Pressure: 125/64 mmHg, Capillary Blood Glucose: 119 mg/dl. General Notes: glucose per pt report this am General Notes: Wound exam; the patient has a small punched-out area on the right medial first MTP. This looks better in terms of a healthy wound bed status post angioplasty of the anterior tibial artery. His pedal pulses palpable. We have been using endoform there is some undermining no exposed bone ooThe left third toe remains completely nonviable. The joint is completely destroyed here and this needs to be amputated. He has a palpable dorsalis pedis pulse Integumentary (Hair,  Skin) Wound #1 status is Open. Original cause of wound was Blister. The date acquired was: 02/18/2020. The wound has been in treatment 6 weeks. The wound is located on the Right,Medial Foot. The wound measures 0.8cm length x 0.6cm width x 0.6cm depth; 0.377cm^2 area and 0.226cm^3 volume. There is bone and Fat Layer (Subcutaneous Tissue) exposed. There is no tunneling noted, however, there is undermining starting at 12:00 and ending at 12:00 with a maximum distance of 0.9cm. There is a medium amount of serosanguineous drainage noted. The wound margin is well defined and not attached to the wound base. There is large (67-100%) red granulation within the wound bed. There is a small (1-33%) amount of necrotic tissue within the wound bed including Adherent Slough. Wound #3 status is Open. Original cause of wound was Gradually Appeared. The date  acquired was: 05/17/2020. The wound has been in treatment 5 weeks. The wound is located on the Left T Third. The wound measures 1.6cm length x 1.9cm width x 1.1cm depth; 2.388cm^2 area and 2.626cm^3 volume. There is bone, oe joint, and Fat Layer (Subcutaneous Tissue) exposed. There is no tunneling or undermining noted. There is a medium amount of serosanguineous drainage noted. Foul odor after cleansing was noted. The wound margin is well defined and not attached to the wound base. There is small (1-33%) red granulation within the wound bed. There is a large (67-100%) amount of necrotic tissue within the wound bed including Adherent Slough. Assessment Active Problems ICD-10 Type 2 diabetes mellitus with foot ulcer Type 2 diabetes mellitus with diabetic peripheral angiopathy without gangrene Non-pressure chronic ulcer of other part of right foot with necrosis of bone Non-pressure chronic ulcer of other part of left foot with necrosis of bone Non-pressure chronic ulcer of other part of right foot with other specified severity Plan Follow-up Appointments: Return  Appointment in 1 week. Other: - Patient has appt with Dr. Kasandra Knudsen 06/28/20 regarding possible amputation. Bathing/ Shower/ Hygiene: May shower with protection but do not get wound dressing(s) wet. Off-Loading: Open toe surgical shoe to: - right foot Home Health: New wound care orders this week; continue Home Health for wound care. May utilize formulary equivalent dressing for wound treatment orders unless otherwise specified. - Primary dressing change to right foot Dressing changes to be completed by [facility] on Monday / Wednesday / Friday except when patient has scheduled visit at [facility]. Other Home Health Orders/Instructions: - [facility] WOUND #1: - Foot Wound Laterality: Right, Medial Cleanser: Wound Cleanser (Home Health) 3 x Per Week/7 Days Discharge Instructions: Cleanse the wound with wound cleanser or normal saline prior to applying a clean dressing using gauze sponges, not tissue or cotton balls. Prim Dressing: Endoform 2x2 in ([place]) 3 x Per Week/7 Days ary Discharge Instructions: Moisten with saline Secondary Dressing: Woven Gauze Sponges 2x2 in (Home Health) 3 x Per Week/7 Days Discharge Instructions: Apply saline moistened gauze over Endoform to hold in place Secondary Dressing: Optifoam Non-Adhesive Dressing, 4x4 in (Home Health) 3 x Per Week/7 Days Discharge Instructions: Foam donut Secured With: Kerlix Roll Sterile, 4.5x3.1 (in/yd) ([place]) 3 x Per Week/7 Days Discharge Instructions: Secure with Kerlix as directed. Secured With: Paper T ape, 2x10 (in/yd) (Home Health) 3 x Per Week/7 Days Discharge Instructions: Secure dressing with tape as directed. WOUND #3: - T Third Wound Laterality: Left oe Cleanser: Wound Cleanser (Home Health) 3 x Per Week/7 Days Discharge Instructions: Cleanse the wound with wound cleanser or normal saline prior to applying a clean dressing using gauze sponges, not tissue or cotton balls. Prim Dressing: KerraCel Ag Gelling Fiber  Dressing, 2x2 in (silver alginate) (Home Health) 3 x Per Week/7 Days ary Discharge Instructions: Apply silver alginate to wound bed as instructed Secondary Dressing: Woven Gauze Sponges 2x2 in ([place]) 3 x Per Week/7 Days Discharge Instructions: Apply over primary dressing as directed. Secured With: [employer], 4.5x3.1 (in/yd) (Home Health) 3 x Per Week/7 Days Discharge Instructions: Secure with Kerlix as directed. Secured With: Paper T ape, 2x10 (in/yd) (Home Health) 3 x Per Week/7 Days Discharge Instructions: Secure dressing with tape as directed. 1. I think I reached out to Dr. Donzetta Matters about this third toe but I will remind him. We have been using silver alginate on this toe 2. The wound on the medial toe has undermining but I  did not remove this I am still using endoform. Surface of the wound certainly looks better. Electronic Signature(s) Signed: 06/25/2020 4:45:31 PM By: Linton Ham MD Entered By: Linton Ham on 06/25/2020 11:02:53 -------------------------------------------------------------------------------- SuperBill Details Patient Name: Date of Service: [name] 06/25/2020 Medical Record Number: [id number] Patient Account Number: [id number] Date of Birth/Sex: Treating RN: February 03, 1926 (87 y.o. Peter James Primary Care Provider: [name] Other Clinician: Referring Provider: Treating Provider/Extender: Greta Doom Weeks in Treatment: 6 Diagnosis Coding ICD-10 Codes Code Description E11.621 Type 2 diabetes mellitus with foot ulcer E11.51 Type 2 diabetes mellitus with diabetic peripheral angiopathy without gangrene L97.514 Non-pressure chronic ulcer of other part of right foot with necrosis of bone L97.524 Non-pressure chronic ulcer of other part of left foot with necrosis of bone L97.518 Non-pressure chronic ulcer of other part of right foot with other specified severity Facility Procedures CPT4 Code: 01007121 Description:  99214 - WOUND CARE VISIT-LEV 4 EST PT Modifier: Quantity: 1 Physician Procedures : CPT4 Code Description Modifier [id number] - WC PHYS LEVEL 3 - EST PT ICD-10 Diagnosis Description L97.514 Non-pressure chronic ulcer of other part of right foot with necrosis of bone L97.524 Non-pressure chronic ulcer of other part of left foot with  necrosis of bone E11.51 Type 2 diabetes mellitus with diabetic peripheral angiopathy without gangrene Quantity: 1 Electronic Signature(s) Signed: 06/25/2020 4:45:31 PM By: Linton Ham MD Entered By: Linton Ham on 06/25/2020 11:03:34

## 2020-06-25 NOTE — Progress Notes (Addendum)
BAILEY, KOLBE ([id number]) Visit Report for 06/25/2020 Arrival Information Details Patient Name: Date of Service: [name] 06/25/2020 9:45 A M Medical Record Number: [id number] Patient Account Number: [id number] Date of Birth/Sex: Treating RN: 01/12/1926 (87 y.o. [name] Primary Care Zylan Almquist: [name] Other Clinician: Referring Karyna Bessler: Treating Yuki Brunsman/Extender: Greta Doom Weeks in Treatment: 6 Visit Information History Since Last Visit Added or deleted any medications: No Patient Arrived: Kasandra Knudsen Any new allergies or adverse reactions: No Arrival Time: 10:25 Had a fall or experienced change in No Accompanied By: caregiver activities of daily living that may affect Transfer Assistance: None risk of falls: Patient Identification Verified: Yes Signs or symptoms of abuse/neglect since last visito No Secondary Verification Process Completed: Yes Hospitalized since last visit: No Patient Requires Transmission-Based Precautions: No Implantable device outside of the clinic excluding No Patient Has Alerts: Yes cellular tissue based products placed in the center Patient Alerts: ABI's 12/21: R:0.75 L0.77 since last visit: Has Dressing in Place as Prescribed: Yes Pain Present Now: No Electronic Signature(s) Signed: 06/25/2020 5:00:24 PM By: Baruch Gouty RN, BSN Entered By: Baruch Gouty on 06/25/2020 10:27:21 -------------------------------------------------------------------------------- Clinic Level of Care Assessment Details Patient Name: Date of Service: [name] 06/25/2020 9:45 A M Medical Record Number: [id number] Patient Account Number: [id number] Date of Birth/Sex: Treating RN: September 18, 1925 (87 y.o. Marcheta Grammes Primary Care Tanetta Fuhriman: [name] Other Clinician: Referring Bayli Quesinberry: Treating Mujtaba Bollig/Extender: Greta Doom Weeks in Treatment: 6 Clinic Level of Care Assessment Items TOOL 4 Quantity  Score X- 1 0 Use when only an EandM is performed on FOLLOW-UP visit ASSESSMENTS - Nursing Assessment / Reassessment X- 1 10 Reassessment of Co-morbidities (includes updates in patient status) X- 1 5 Reassessment of Adherence to Treatment Plan ASSESSMENTS - Wound and Skin A ssessment / Reassessment []  - 0 Simple Wound Assessment / Reassessment - one wound X- 2 5 Complex Wound Assessment / Reassessment - multiple wounds []  - 0 Dermatologic / Skin Assessment (not related to wound area) ASSESSMENTS - Focused Assessment []  - 0 Circumferential Edema Measurements - multi extremities []  - 0 Nutritional Assessment / Counseling / Intervention []  - 0 Lower Extremity Assessment (monofilament, tuning fork, pulses) []  - 0 Peripheral Arterial Disease Assessment (using hand held doppler) ASSESSMENTS - Ostomy and/or Continence Assessment and Care []  - 0 Incontinence Assessment and Management []  - 0 Ostomy Care Assessment and Management (repouching, etc.) PROCESS - Coordination of Care []  - 0 Simple Patient / Family Education for ongoing care X- 1 20 Complex (extensive) Patient / Family Education for ongoing care X- 1 10 Staff obtains Programmer, systems, Records, T Results / Process Orders est X- 1 10 Staff telephones HHA, Nursing Homes / Clarify orders / etc []  - 0 Routine Transfer to another Facility (non-emergent condition) []  - 0 Routine Hospital Admission (non-emergent condition) []  - 0 New Admissions / Biomedical engineer / Ordering NPWT Apligraf, etc. , []  - 0 Emergency Hospital Admission (emergent condition) []  - 0 Simple Discharge Coordination []  - 0 Complex (extensive) Discharge Coordination PROCESS - Special Needs []  - 0 Pediatric / Minor Patient Management []  - 0 Isolation Patient Management []  - 0 Hearing / Language / Visual special needs []  - 0 Assessment of Community assistance (transportation, D/C planning, etc.) []  - 0 Additional assistance / Altered  mentation []  - 0 Support Surface(s) Assessment (bed, cushion, seat, etc.) INTERVENTIONS - Wound Cleansing / Measurement []  - 0 Simple Wound Cleansing - one wound X- 2 5  Complex Wound Cleansing - multiple wounds X- 1 5 Wound Imaging (photographs - any number of wounds) []  - 0 Wound Tracing (instead of photographs) []  - 0 Simple Wound Measurement - one wound X- 2 5 Complex Wound Measurement - multiple wounds INTERVENTIONS - Wound Dressings []  - 0 Small Wound Dressing one or multiple wounds X- 2 15 Medium Wound Dressing one or multiple wounds []  - 0 Large Wound Dressing one or multiple wounds X- 1 5 Application of Medications - topical []  - 0 Application of Medications - injection INTERVENTIONS - Miscellaneous []  - 0 External ear exam []  - 0 Specimen Collection (cultures, biopsies, blood, body fluids, etc.) []  - 0 Specimen(s) / Culture(s) sent or taken to Lab for analysis []  - 0 Patient Transfer (multiple staff / Civil Service fast streamer / Similar devices) []  - 0 Simple Staple / Suture removal (25 or less) []  - 0 Complex Staple / Suture removal (26 or more) []  - 0 Hypo / Hyperglycemic Management (close monitor of Blood Glucose) []  - 0 Ankle / Brachial Index (ABI) - do not check if billed separately X- 1 5 Vital Signs Has the patient been seen at the hospital within the last three years: Yes Total Score: 130 Level Of Care: New/Established - Level 4 Electronic Signature(s) Signed: 06/25/2020 5:03:53 PM By: Lorrin Jackson Entered By: Lorrin Jackson on 06/25/2020 10:59:36 -------------------------------------------------------------------------------- Encounter Discharge Information Details Patient Name: Date of Service: [name] 06/25/2020 9:45 A M Medical Record Number: [id number] Patient Account Number: [id number] Date of Birth/Sex: Treating RN: 06-Sep-1925 (87 y.o. [name] Primary Care Jaylen Knope: [name] Other Clinician: Referring Eleny Cortez: Treating  Kenyona Rena/Extender: Greta Doom Weeks in Treatment: 6 Encounter Discharge Information Items Discharge Condition: Stable Ambulatory Status: Cane Discharge Destination: Home Transportation: Other Accompanied By: friend Schedule Follow-up Appointment: Yes Clinical Summary of Care: Electronic Signature(s) Signed: 06/25/2020 4:53:51 PM By: Deon Pilling Entered By: Deon Pilling on 06/25/2020 13:37:52 -------------------------------------------------------------------------------- Lower Extremity Assessment Details Patient Name: Date of Service: [name] 06/25/2020 9:45 A M Medical Record Number: [id number] Patient Account Number: [id number] Date of Birth/Sex: Treating RN: [birth year] (87 y.o. [name] Primary Care Aleczander Fandino: [name] Other Clinician: Referring Khasir Woodrome: Treating Hayat Warbington/Extender: Greta Doom Weeks in Treatment: 6 Edema Assessment Assessed: [Left: No] [Right: No] Edema: [Left: No] [Right: No] Calf Left: Right: Point of Measurement: 42 cm From Medial Instep 36 cm 36.3 cm Ankle Left: Right: Point of Measurement: 23 cm From Medial Instep 24.3 cm 23.5 cm Vascular Assessment Pulses: Dorsalis Pedis Palpable: [Left:No] [Right:Yes] Electronic Signature(s) Signed: 06/25/2020 5:00:24 PM By: Baruch Gouty RN, BSN Entered By: Baruch Gouty on 06/25/2020 10:36:56 -------------------------------------------------------------------------------- Multi Wound Chart Details Patient Name: Date of Service: HA [name] 06/25/2020 9:45 A M Medical Record Number: [id number] Patient Account Number: [id number] Date of Birth/Sex: Treating RN: 08-Mar-1926 (87 y.o. Marcheta Grammes Primary Care Jhonathan Desroches: [name] Other Clinician: Referring Caoilainn Sacks: Treating Dimitrios Balestrieri/Extender: Greta Doom Weeks in Treatment: 6 Vital Signs Height(in): 73 Capillary Blood Glucose(mg/dl): 119 Weight(lbs): 217 Pulse(bpm):  37 Body Mass Index(BMI): 29 Blood Pressure(mmHg): 125/64 Temperature(F): 97.9 Respiratory Rate(breaths/min): 18 Photos: [1:No Photos Right, Medial Foot] [3:No 30 Left T Third oe] [N/A:N/A N/A] Wound Location: [1:Blister] [3:Gradually Appeared] [N/A:N/A] Wounding Event: [1:Diabetic Wound/Ulcer of the Lower] [3:Diabetic Wound/Ulcer of the Lower] [N/A:N/A] Primary Etiology: [1:Extremity Coronary Artery Disease,] [3:Extremity Coronary Artery Disease,] [N/A:N/A] Comorbid History: [1:Hypertension, Peripheral Arterial Disease, Type II Diabetes, Neuropathy 02/18/2020] [3:Hypertension, Peripheral Arterial Disease, Type  II Diabetes, Neuropathy 05/17/2020] [N/A:N/A] Date Acquired: [1:6] [3:5] [N/A:N/A] Weeks of Treatment: [1:Open] [3:Open] [N/A:N/A] Wound Status: [1:0.8x0.6x0.6] [3:1.6x1.9x1.1] [N/A:N/A] Measurements L x W x D (cm) [1:0.377] [3:2.388] [N/A:N/A] A (cm) : rea [1:0.226] [3:2.626] [N/A:N/A] Volume (cm) : [1:-301.10%] [3:-2440.40%] [N/A:N/A] % Reduction in A rea: [1:-201.30%] [3:-29077.80%] [N/A:N/A] % Reduction in Volume: [1:12] Starting Position 1 (o'clock): [1:12] Ending Position 1 (o'clock): [1:0.9] Maximum Distance 1 (cm): [1:Yes] [3:No] [N/A:N/A] Undermining: [1:Grade 2] [3:Grade 2] [N/A:N/A] Classification: [1:Medium] [3:Medium] [N/A:N/A] Exudate A mount: [1:Serosanguineous] [3:Serosanguineous] [N/A:N/A] Exudate Type: [1:red, brown] [3:red, brown] [N/A:N/A] Exudate Color: [1:No] [3:Yes] [N/A:N/A] Foul Odor A Cleansing: [1:fter N/A] [3:No] [N/A:N/A] Odor A nticipated Due to Product Use: [1:Well defined, not attached] [3:Well defined, not attached] [N/A:N/A] Wound Margin: [1:Large (67-100%)] [3:Small (1-33%)] [N/A:N/A] Granulation A mount: [1:Red] [3:Red] [N/A:N/A] Granulation Quality: [1:Small (1-33%)] [3:Large (67-100%)] [N/A:N/A] Necrotic A mount: [1:Fat Layer (Subcutaneous Tissue): Yes Fat Layer (Subcutaneous Tissue): Yes N/A] Exposed Structures: [1:Bone: Yes  Fascia: No Tendon: No Muscle: No Joint: No None] [3:Joint: Yes Bone: Yes Fascia: No Tendon: No Muscle: No None] [N/A:N/A] Treatment Notes Electronic Signature(s) Signed: 06/25/2020 4:45:31 PM By: Linton Ham MD Signed: 06/25/2020 5:03:53 PM By: Lorrin Jackson Entered By: Linton Ham on 06/25/2020 10:59:58 -------------------------------------------------------------------------------- Multi-Disciplinary Care Plan Details Patient Name: Date of Service: HA [name] 06/25/2020 9:45 A M Medical Record Number: [id number] Patient Account Number: [id number] Date of Birth/Sex: Treating RN: 07-17-1925 (87 y.o. Marcheta Grammes Primary Care Aseel Uhde: [name] Other Clinician: Referring Roshaun Pound: Treating Sonjia Wilcoxson/Extender: Greta Doom Weeks in Treatment: 6 Multidisciplinary Care Plan reviewed with physician Active Inactive Nutrition Nursing Diagnoses: Impaired glucose control: actual or potential Potential for alteratiion in Nutrition/Potential for imbalanced nutrition Goals: Patient/caregiver agrees to and verbalizes understanding of need to use nutritional supplements and/or vitamins as prescribed Date Initiated: 05/10/2020 Date Inactivated: 06/07/2020 Target Resolution Date: 06/11/2020 Goal Status: Met Patient/caregiver will maintain therapeutic glucose control Date Initiated: 05/10/2020 Target Resolution Date: 07/09/2020 Goal Status: Active Interventions: Assess HgA1c results as ordered upon admission and as needed Assess patient nutrition upon admission and as needed per policy Provide education on elevated blood sugars and impact on wound healing Provide education on nutrition Treatment Activities: Education provided on Nutrition : 05/17/2020 Notes: Wound/Skin Impairment Nursing Diagnoses: Impaired tissue integrity Knowledge deficit related to ulceration/compromised skin integrity Goals: Patient/caregiver will verbalize understanding of skin care  regimen Date Initiated: 05/10/2020 Target Resolution Date: 07/09/2020 Goal Status: Active Ulcer/skin breakdown will have a volume reduction of 30% by week 4 Date Initiated: 05/10/2020 Date Inactivated: 06/07/2020 Target Resolution Date: 06/11/2020 Goal Status: Unmet Unmet Reason: Osteomyelitis, PAD Interventions: Assess patient/caregiver ability to obtain necessary supplies Assess patient/caregiver ability to perform ulcer/skin care regimen upon admission and as needed Assess ulceration(s) every visit Provide education on ulcer and skin care Notes: Electronic Signature(s) Signed: 06/25/2020 9:50:43 AM By: Lorrin Jackson Entered By: Lorrin Jackson on 06/25/2020 09:50:42 -------------------------------------------------------------------------------- Pain Assessment Details Patient Name: Date of Service: [name] 06/25/2020 9:45 A M Medical Record Number: [id number] Patient Account Number: [id number] Date of Birth/Sex: Treating RN: 09/19/1925 (87 y.o. [name] Primary Care Kyriakos Babler: [name] Other Clinician: Referring Merland Holness: Treating Azure Barrales/Extender: Greta Doom Weeks in Treatment: 6 Active Problems Location of Pain Severity and Description of Pain Patient Has Paino No Site Locations With Dressing Change: Yes Duration of the Pain. Constant / Intermittento Intermittent Rate the pain. Current Pain Level: 0 Worst Pain Level: 5 Least Pain Level: 0 Character of Pain Describe  the Pain: Aching, Tender Pain Management and Medication Current Pain Management: Other: time Is the Current Pain Management Adequate: Adequate How does your wound impact your activities of daily livingo Sleep: Yes Bathing: No Appetite: No Relationship With Others: No Bladder Continence: No Emotions: No Bowel Continence: No Work: No Toileting: No Drive: No Dressing: No Hobbies: No Electronic Signature(s) Signed: 06/25/2020 5:00:24 PM By: Baruch Gouty RN,  BSN Entered By: Baruch Gouty on 06/25/2020 10:29:51 -------------------------------------------------------------------------------- Patient/Caregiver Education Details Patient Name: Date of Service: [name] 4/8/2022andnbsp9:45 A M Medical Record Number: [id number] Patient Account Number: [id number] Date of Birth/Gender: Treating RN: 1926-02-15 (87 y.o. Marcheta Grammes Primary Care Physician: [name] Other Clinician: Referring Physician: Treating Physician/Extender: Samul Dada in Treatment: 6 Education Assessment Education Provided To: Patient Education Topics Provided Elevated Blood Sugar/ Impact on Healing: Methods: Explain/Verbal, Printed Responses: State content correctly Wound/Skin Impairment: Methods: Demonstration, Explain/Verbal, Printed Responses: State content correctly Electronic Signature(s) Signed: 06/25/2020 5:03:53 PM By: Lorrin Jackson Entered By: Lorrin Jackson on 06/25/2020 10:03:42 -------------------------------------------------------------------------------- Wound Assessment Details Patient Name: Date of Service: [name] 06/25/2020 9:45 A M Medical Record Number: [id number] Patient Account Number: [id number] Date of Birth/Sex: Treating RN: [birth year] (87 y.o. [name] Primary Care Arrielle Mcginn: [name] Other Clinician: Referring Jahmiyah Dullea: Treating Leilynn Pilat/Extender: Greta Doom Weeks in Treatment: 6 Wound Status Wound Number: 1 Primary Diabetic Wound/Ulcer of the Lower Extremity Etiology: Wound Location: Right, Medial Foot Wound Open Wounding Event: Blister Status: Date Acquired: 02/18/2020 Comorbid Coronary Artery Disease, Hypertension, Peripheral Arterial Weeks Of Treatment: 6 History: Disease, Type II Diabetes, Neuropathy Clustered Wound: No Photos Wound Measurements Length: (cm) 0.8 Width: (cm) 0.6 Depth: (cm) 0.6 Area: (cm) 0.377 Volume: (cm) 0.226 %  Reduction in Area: -301.1% % Reduction in Volume: -201.3% Epithelialization: None Tunneling: No Undermining: Yes Starting Position (o'clock): 12 Ending Position (o'clock): 12 Maximum Distance: (cm) 0.9 Wound Description Classification: Grade 2 Wound Margin: Well defined, not attached Exudate Amount: Medium Exudate Type: Serosanguineous Exudate Color: red, brown Foul Odor After Cleansing: No Slough/Fibrino Yes Wound Bed Granulation Amount: Large (67-100%) Exposed Structure Granulation Quality: Red Fascia Exposed: No Necrotic Amount: Small (1-33%) Fat Layer (Subcutaneous Tissue) Exposed: Yes Necrotic Quality: Adherent Slough Tendon Exposed: No Muscle Exposed: No Joint Exposed: No Bone Exposed: Yes Treatment Notes Wound #1 (Foot) Wound Laterality: Right, Medial Cleanser Wound Cleanser Discharge Instruction: Cleanse the wound with wound cleanser or normal saline prior to applying a clean dressing using gauze sponges, not tissue or cotton balls. Peri-Wound Care Topical Primary Dressing Endoform 2x2 in Discharge Instruction: Moisten with saline Secondary Dressing Woven Gauze Sponges 2x2 in Discharge Instruction: Apply saline moistened gauze over Endoform to hold in place Optifoam Non-Adhesive Dressing, 4x4 in Discharge Instruction: Foam donut Secured With Kerlix Roll Sterile, 4.5x3.1 (in/yd) Discharge Instruction: Secure with Kerlix as directed. Paper Tape, 2x10 (in/yd) Discharge Instruction: Secure dressing with tape as directed. Compression Wrap Compression Stockings Add-Ons Electronic Signature(s) Signed: 06/25/2020 5:00:24 PM By: Baruch Gouty RN, BSN Signed: 06/28/2020 7:58:59 AM By: Sandre Kitty Entered By: Sandre Kitty on 06/25/2020 16:54:14 -------------------------------------------------------------------------------- Wound Assessment Details Patient Name: Date of Service: [name] 06/25/2020 9:45 A M Medical Record Number:  [id number] Patient Account Number: [id number] Date of Birth/Sex: Treating RN: [birth year] (87 y.o. [name] Primary Care Araminta Zorn: [name] Other Clinician: Referring Robbie Nangle: Treating Anjoli Diemer/Extender: Greta Doom Weeks in Treatment: 6 Wound Status Wound Number: 3 Primary Diabetic Wound/Ulcer of the Lower  Extremity Etiology: Wound Location: Left T Third oe Wound Open Wounding Event: Gradually Appeared Status: Date Acquired: 05/17/2020 Comorbid Coronary Artery Disease, Hypertension, Peripheral Arterial Weeks Of Treatment: 5 History: Disease, Type II Diabetes, Neuropathy Clustered Wound: No Photos Wound Measurements Length: (cm) 1.6 Width: (cm) 1.9 Depth: (cm) 1.1 Area: (cm) 2.388 Volume: (cm) 2.626 % Reduction in Area: -2440.4% % Reduction in Volume: -29077.8% Epithelialization: None Tunneling: No Undermining: No Wound Description Classification: Grade 2 Wound Margin: Well defined, not attached Exudate Amount: Medium Exudate Type: Serosanguineous Exudate Color: red, brown Foul Odor After Cleansing: Yes Due to Product Use: No Slough/Fibrino Yes Wound Bed Granulation Amount: Small (1-33%) Exposed Structure Granulation Quality: Red Fascia Exposed: No Necrotic Amount: Large (67-100%) Fat Layer (Subcutaneous Tissue) Exposed: Yes Necrotic Quality: Adherent Slough Tendon Exposed: No Muscle Exposed: No Joint Exposed: Yes Bone Exposed: Yes Treatment Notes Wound #3 (Toe Third) Wound Laterality: Left Cleanser Wound Cleanser Discharge Instruction: Cleanse the wound with wound cleanser or normal saline prior to applying a clean dressing using gauze sponges, not tissue or cotton balls. Peri-Wound Care Topical Primary Dressing KerraCel Ag Gelling Fiber Dressing, 2x2 in (silver alginate) Discharge Instruction: Apply silver alginate to wound bed as instructed Secondary Dressing Woven Gauze Sponges 2x2 in Discharge Instruction: Apply over  primary dressing as directed. Secured With [employer], 4.5x3.1 (in/yd) Discharge Instruction: Secure with Kerlix as directed. Paper Tape, 2x10 (in/yd) Discharge Instruction: Secure dressing with tape as directed. Compression Wrap Compression Stockings Add-Ons Electronic Signature(s) Signed: 06/25/2020 5:00:24 PM By: Baruch Gouty RN, BSN Signed: 06/28/2020 7:58:59 AM By: Sandre Kitty Entered By: Sandre Kitty on 06/25/2020 16:54:40 -------------------------------------------------------------------------------- Vitals Details Patient Name: Date of Service: [name] 06/25/2020 9:45 A M Medical Record Number: [id number] Patient Account Number: [id number] Date of Birth/Sex: Treating RN: 1925/04/06 (87 y.o. [name] Primary Care Jurney Overacker: [name] Other Clinician: Referring Myrka Sylva: Treating Soren Pigman/Extender: Greta Doom Weeks in Treatment: 6 Vital Signs Time Taken: 10:27 Temperature (F): 97.9 Height (in): 73 Pulse (bpm): 84 Source: Stated Respiratory Rate (breaths/min): 18 Weight (lbs): 217 Blood Pressure (mmHg): 125/64 Source: Stated Capillary Blood Glucose (mg/dl): 119 Body Mass Index (BMI): 28.6 Reference Range: 80 - 120 mg / dl Notes glucose per pt report this am Electronic Signature(s) Signed: 06/25/2020 5:00:24 PM By: Baruch Gouty RN, BSN Entered By: Baruch Gouty on 06/25/2020 10:29:12

## 2020-06-28 ENCOUNTER — Encounter: Payer: Self-pay | Admitting: *Deleted

## 2020-06-28 ENCOUNTER — Ambulatory Visit (INDEPENDENT_AMBULATORY_CARE_PROVIDER_SITE_OTHER)
Admission: RE | Admit: 2020-06-28 | Discharge: 2020-06-28 | Disposition: A | Discharge status: Home / Self Care | Payer: No Typology Code available for payment source | Source: Ambulatory Visit | Attending: Vascular Surgery | Admitting: Vascular Surgery

## 2020-06-28 ENCOUNTER — Ambulatory Visit (INDEPENDENT_AMBULATORY_CARE_PROVIDER_SITE_OTHER): Payer: No Typology Code available for payment source | Admitting: Vascular Surgery

## 2020-06-28 ENCOUNTER — Encounter: Payer: Self-pay | Admitting: Vascular Surgery

## 2020-06-28 ENCOUNTER — Other Ambulatory Visit: Payer: Self-pay | Admitting: *Deleted

## 2020-06-28 ENCOUNTER — Other Ambulatory Visit: Payer: Self-pay

## 2020-06-28 VITALS — BP 139/76 | HR 87 | Temp 97.9°F | Resp 20 | Ht 73.0 in | Wt 194.0 lb

## 2020-06-28 DIAGNOSIS — I739 Peripheral vascular disease, unspecified: Secondary | ICD-10-CM

## 2020-06-28 NOTE — H&P (View-Only) (Signed)
Patient ID: Peter James., male   DOB: 1925/07/27, 87 y.o.   MRN: [id number]  Reason for Consult: Follow-up   Referred by [name], MD  Subjective:     HPI:  Rashied James. is a 87 y.o. male history of bilateral lower extremity endovascular intervention.  He has a chronic wound on the right foot.  He had first and second toe amputation on the left now has ulceration of the third toe.  Most recently underwent treatment of his right anterior tibial artery.  He remains on aspirin, Plavix and a statin.  He now has worsening left third toe ulceration.  Denies fevers or chills.  Past Medical History:  Diagnosis Date  . Acute lower GI bleeding    Recurrent: 2011, 2012  . Bladder cancer ([place])    Transitional cell, 2001  . Bradycardia 01/20/2020  . CAD (coronary atherosclerotic disease)    Nonobstructive, 05/2012  . Exertional dyspnea    EF 09-32%, grade 1 diastolic dysfunction, 05/5571  . HLD (hyperlipidemia)   . HTN (hypertension)   . Neuropathy   . Other insomnia   . PAD (peripheral artery disease) ([place])    ABIs, 2009: 0.89 right; 0.99 left  . Type 2 diabetes mellitus ([place])   . Valvular heart disease   . Vertigo    Family History  Problem Relation Age of Onset  . Heart attack Mother   . Heart attack Father   . Heart attack Sister   . Heart attack Sister   . Heart attack Sister   . Heart attack Sister    Past Surgical History:  Procedure Laterality Date  . ABDOMINAL AORTOGRAM W/LOWER EXTREMITY N/A 01/19/2020   Procedure: ABDOMINAL AORTOGRAM W/ Bilateral LOWER EXTREMITY Run[place];  Surgeon: Waynetta Sandy, MD;  Location: [facility];  Service: Cardiovascular;  Laterality: N/A;  . ABDOMINAL AORTOGRAM W/LOWER EXTREMITY N/A 06/14/2020   Procedure: ABDOMINAL AORTOGRAM W/LOWER EXTREMITY;  Surgeon: Waynetta Sandy, MD;  Location: [facility];  Service: Cardiovascular;  Laterality: N/A;  . AMPUTATION Left 10/01/2019   Procedure: LEFT GREAT  TOE AMPUTATION AT METATARSOPHALANGEAL JOINT;  Surgeon: [name], MD;  Location: [place];  Service: Orthopedics;  Laterality: Left;  . AMPUTATION Left 02/24/2020   Procedure: LEFT SECOND TOE AMPUTATION;  Surgeon: Waynetta Sandy, MD;  Location: [place];  Service: Vascular;  Laterality: Left;  . BIV PACEMAKER INSERTION CRT-P N/A 02/11/2020   Procedure: BIV PACEMAKER INSERTION CRT-P;  Surgeon: Thompson Grayer, MD;  Location: [place] [facility];  Service: Cardiovascular;  Laterality: N/A;  . CATARACT EXTRACTION     bi lateral  . COLONOSCOPY    . HEMORRHOID SURGERY    . INGUINAL HERNIA REPAIR    . LOWER EXTREMITY ANGIOGRAPHY  02/23/2020   Procedure: Lower Extremity Angiography;  Surgeon: Waynetta Sandy, MD;  Location: [facility] [facility];  Service: Cardiovascular;;  . PERIPHERAL VASCULAR ATHERECTOMY Left 02/23/2020   Procedure: PERIPHERAL VASCULAR ATHERECTOMY;  Surgeon: Waynetta Sandy, MD;  Location: [facility];  Service: Cardiovascular;  Laterality: Left;  . PERIPHERAL VASCULAR BALLOON ANGIOPLASTY  02/23/2020   Procedure: PERIPHERAL VASCULAR BALLOON ANGIOPLASTY;  Surgeon: Waynetta Sandy, MD;  Location: [place] [facility];  Service: Cardiovascular;;  left Peroneal   . PERIPHERAL VASCULAR INTERVENTION Right 01/19/2020   Procedure: PERIPHERAL VASCULAR INTERVENTION;  Surgeon: Waynetta Sandy, MD;  Location: [facility];  Service: Cardiovascular;  Laterality: Right;  . PERIPHERAL VASCULAR INTERVENTION  Right 06/14/2020   Procedure: PERIPHERAL VASCULAR INTERVENTION;  Surgeon: Waynetta Sandy, MD;  Location: [facility];  Service: Cardiovascular;  Laterality: Right;    Short Social History:  Social History   Tobacco Use  . Smoking status: Former Smoker    Quit date: 04/19/1972    Years since quitting: 48.2  . Smokeless tobacco: Never Used  Substance Use Topics  . Alcohol use: Never    No Known Allergies  Current  Outpatient Medications  Medication Sig Dispense Refill  . albuterol (VENTOLIN HFA) 108 (90 Base) MCG/ACT inhaler Inhale 2 puffs into the lungs every 6 (six) hours as needed for wheezing or shortness of breath.    . allopurinol (ZYLOPRIM) 300 MG tablet Take 300 mg by mouth daily.     . Alogliptin Benzoate 12.5 MG TABS Take 6.25 mg by mouth daily.    [name] amLODipine (NORVASC) 10 MG tablet Take 1 tablet (10 mg total) by mouth daily. 90 tablet 3  . aspirin EC 81 MG tablet Take 81 mg by mouth daily. Swallow whole.    [name] atorvastatin (LIPITOR) 10 MG tablet Take 1 tablet (10 mg total) by mouth every evening. 30 tablet 6  . carvedilol (COREG) 3.125 MG tablet Take 3.125 mg by mouth 2 (two) times daily with a meal.    . cholecalciferol (VITAMIN D3) 25 MCG (1000 UNIT) tablet Take 1,000 Units by mouth daily.    . clopidogrel (PLAVIX) 75 MG tablet Take 1 tablet (75 mg total) by mouth daily. 30 tablet 3  . nortriptyline (PAMELOR) 10 MG capsule Take 10 mg by mouth at bedtime.     . polyethylene glycol (MIRALAX / GLYCOLAX) 17 g packet Take 17 g by mouth daily as needed for mild constipation.    . tamsulosin (FLOMAX) 0.4 MG CAPS capsule Take 0.4 mg by mouth daily.     . traMADol (ULTRAM) 50 MG tablet Take 1 tablet (50 mg total) by mouth every 6 (six) hours as needed. 30 tablet 0   No current facility-administered medications for this visit.    Review of Systems  Constitutional:  Constitutional negative. HENT: HENT negative.  Eyes: Eyes negative.  Respiratory: Respiratory negative.  Cardiovascular: Cardiovascular negative.  GI: Gastrointestinal negative.  Musculoskeletal: Musculoskeletal negative.  Skin: Positive for wound.  Neurological: Neurological negative. Hematologic: Hematologic/lymphatic negative.  Psychiatric: Psychiatric negative.        Objective:  Objective  Vitals:   06/28/20 1531  BP: 139/76  Pulse: 87  Resp: 20  Temp: 97.9 F (36.6 C)  SpO2: 98%    Physical Exam HENT:      Nose:     Comments: Wearing a mask    Mouth/Throat:     Mouth: Mucous membranes are moist.  Eyes:     Pupils: Pupils are equal, round, and reactive to light.  Cardiovascular:     Comments: Strong anterior tibial and dorsalis pedis signal on the right, strong peroneal signal on the left Musculoskeletal:        General: Normal range of motion.     Right lower leg: No edema.     Left lower leg: No edema.  Skin:    General: Skin is warm.  Neurological:     General: No focal deficit present.     Mental Status: He is alert.  Psychiatric:        Mood and Affect: Mood normal.        Behavior: Behavior normal.  Thought Content: Thought content normal.        Judgment: Judgment normal.     Data: ---+  RIGHT   PSV cm/sRatioStenosis    Waveform Comments           +-----------+--------+-----+---------------+----------+--------------------  ----+  CFA Distal 278      50-74% stenosisbiphasic                +-----------+--------+-----+---------------+----------+--------------------  ----+  DFA    67              biphasic                +-----------+--------+-----+---------------+----------+--------------------  ----+  SFA Prox  121              biphasic                +-----------+--------+-----+---------------+----------+--------------------  ----+  SFA Mid  202      50-74% stenosisbiphasic                +-----------+--------+-----+---------------+----------+--------------------  ----+  SFA Distal 172              biphasic                +-----------+--------+-----+---------------+----------+--------------------  ----+  POP Prox  65              monophasic               +-----------+--------+-----+---------------+----------+--------------------  ----+  ATA Distal  101              monophasic               +-----------+--------+-----+---------------+----------+--------------------  ----+  PTA Distal 10              monophasicOccluded vessel  with                              collateral flow       +-----------+--------+-----+---------------+----------+--------------------  ----+  PERO Distal       occluded                        +-----------+--------+-----+---------------+----------+--------------------  ----+       Right Stent(s):  +---------------+---++----------++  Prox to Stent 70monophasic  +---------------+---++----------++  Proximal Stent 75monophasic  +---------------+---++----------++  Mid Stent   91 monophasic  +---------------+---++----------++  Distal Stent  80monophasic  +---------------+---++----------++  Distal to Stent35monophasic  +---------------+---++----------++    Summary:  Right: 50-74% stenosis noted in the common femoral artery. 50-74% stenosis  noted in the superficial femoral artery. Patent right popliteal stent.    ABI Findings:  +---------+------------------+-----+-------------------+--------+  Right  Rt Pressure (mmHg)IndexWaveform      Comment   +---------+------------------+-----+-------------------+--------+  Brachial 117                           +---------+------------------+-----+-------------------+--------+  ATA   116        0.99 biphasic            +---------+------------------+-----+-------------------+--------+  PTA   22        0.19 dampened monophasic      +---------+------------------+-----+-------------------+--------+  Great Toe48        0.41 Abnormal             +---------+------------------+-----+-------------------+--------+   +----+------------------+-----+----------+-------+  LeftLt Pressure (mmHg)IndexWaveform Comment  +----+------------------+-----+----------+-------+  ATA 84  0.72 biphasic       +----+------------------+-----+----------+-------+  PTA 49        0.42 monophasic      +----+------------------+-----+----------+-------+      Assessment/Plan:     87 year old male with previous history of left first and second toe amputation now with worsening ulceration of the left third toe.  We will plan for left third toe amputation versus transmetatarsal amputation of the last 3 toes.  his peroneal signal on the left suggesting previous revascularization is intact.  On the right side he has a very strong anterior tibial and dorsalis pedis signal to suggest recent intervention still patent.  Wound on the right appears to be improving.  He remains high risk for right first toe amputation.  We will proceed in the near future with left third toe amputation.  I discussed this with patient and his daughter and they demonstrate good understanding we will get him scheduled today     Waynetta Sandy MD [facility]

## 2020-06-28 NOTE — Progress Notes (Signed)
Patient ID: Peter Furlong., male   DOB: 06/07/1925, 87 y.o.   MRN: [id number]  Reason for Consult: Follow-up   Referred by [name], MD  Subjective:     HPI:  Peter Macaraeg. is a 87 y.o. male history of bilateral lower extremity endovascular intervention.  He has a chronic wound on the right foot.  He had first and second toe amputation on the left now has ulceration of the third toe.  Most recently underwent treatment of his right anterior tibial artery.  He remains on aspirin, Plavix and a statin.  He now has worsening left third toe ulceration.  Denies fevers or chills.  Past Medical History:  Diagnosis Date  . Acute lower GI bleeding    Recurrent: 2011, 2012  . Bladder cancer ([place])    Transitional cell, 2001  . Bradycardia 01/20/2020  . CAD (coronary atherosclerotic disease)    Nonobstructive, 05/2012  . Exertional dyspnea    EF 54-62%, grade 1 diastolic dysfunction, 09/348  . HLD (hyperlipidemia)   . HTN (hypertension)   . Neuropathy   . Other insomnia   . PAD (peripheral artery disease) ([place])    ABIs, 2009: 0.89 right; 0.99 left  . Type 2 diabetes mellitus ([place])   . Valvular heart disease   . Vertigo    Family History  Problem Relation Age of Onset  . Heart attack Mother   . Heart attack Father   . Heart attack Sister   . Heart attack Sister   . Heart attack Sister   . Heart attack Sister    Past Surgical History:  Procedure Laterality Date  . ABDOMINAL AORTOGRAM W/LOWER EXTREMITY N/A 01/19/2020   Procedure: ABDOMINAL AORTOGRAM W/ Bilateral LOWER EXTREMITY Run[place];  Surgeon: Waynetta Sandy, MD;  Location: [facility];  Service: Cardiovascular;  Laterality: N/A;  . ABDOMINAL AORTOGRAM W/LOWER EXTREMITY N/A 06/14/2020   Procedure: ABDOMINAL AORTOGRAM W/LOWER EXTREMITY;  Surgeon: Waynetta Sandy, MD;  Location: [facility];  Service: Cardiovascular;  Laterality: N/A;  . AMPUTATION Left 10/01/2019   Procedure: LEFT GREAT  TOE AMPUTATION AT METATARSOPHALANGEAL JOINT;  Surgeon: [name], MD;  Location: [place];  Service: Orthopedics;  Laterality: Left;  . AMPUTATION Left 02/24/2020   Procedure: LEFT SECOND TOE AMPUTATION;  Surgeon: Waynetta Sandy, MD;  Location: [place];  Service: Vascular;  Laterality: Left;  . BIV PACEMAKER INSERTION CRT-P N/A 02/11/2020   Procedure: BIV PACEMAKER INSERTION CRT-P;  Surgeon: Thompson Grayer, MD;  Location: [facility];  Service: Cardiovascular;  Laterality: N/A;  . CATARACT EXTRACTION     bi lateral  . COLONOSCOPY    . HEMORRHOID SURGERY    . INGUINAL HERNIA REPAIR    . LOWER EXTREMITY ANGIOGRAPHY  02/23/2020   Procedure: Lower Extremity Angiography;  Surgeon: Waynetta Sandy, MD;  Location: [place] [facility];  Service: Cardiovascular;;  . PERIPHERAL VASCULAR ATHERECTOMY Left 02/23/2020   Procedure: PERIPHERAL VASCULAR ATHERECTOMY;  Surgeon: Waynetta Sandy, MD;  Location: [place] [facility];  Service: Cardiovascular;  Laterality: Left;  . PERIPHERAL VASCULAR BALLOON ANGIOPLASTY  02/23/2020   Procedure: PERIPHERAL VASCULAR BALLOON ANGIOPLASTY;  Surgeon: Waynetta Sandy, MD;  Location: [facility] [facility];  Service: Cardiovascular;;  left Peroneal   . PERIPHERAL VASCULAR INTERVENTION Right 01/19/2020   Procedure: PERIPHERAL VASCULAR INTERVENTION;  Surgeon: Waynetta Sandy, MD;  Location: [place] [facility];  Service: Cardiovascular;  Laterality: Right;  . PERIPHERAL VASCULAR INTERVENTION  Right 06/14/2020   Procedure: PERIPHERAL VASCULAR INTERVENTION;  Surgeon: Waynetta Sandy, MD;  Location: [place] [facility];  Service: Cardiovascular;  Laterality: Right;    Short Social History:  Social History   Tobacco Use  . Smoking status: Former Smoker    Quit date: 04/19/1972    Years since quitting: 48.2  . Smokeless tobacco: Never Used  Substance Use Topics  . Alcohol use: Never    No Known Allergies  Current  Outpatient Medications  Medication Sig Dispense Refill  . albuterol (VENTOLIN HFA) 108 (90 Base) MCG/ACT inhaler Inhale 2 puffs into the lungs every 6 (six) hours as needed for wheezing or shortness of breath.    . allopurinol (ZYLOPRIM) 300 MG tablet Take 300 mg by mouth daily.     . Alogliptin Benzoate 12.5 MG TABS Take 6.25 mg by mouth daily.    [name] amLODipine (NORVASC) 10 MG tablet Take 1 tablet (10 mg total) by mouth daily. 90 tablet 3  . aspirin EC 81 MG tablet Take 81 mg by mouth daily. Swallow whole.    [name] atorvastatin (LIPITOR) 10 MG tablet Take 1 tablet (10 mg total) by mouth every evening. 30 tablet 6  . carvedilol (COREG) 3.125 MG tablet Take 3.125 mg by mouth 2 (two) times daily with a meal.    . cholecalciferol (VITAMIN D3) 25 MCG (1000 UNIT) tablet Take 1,000 Units by mouth daily.    . clopidogrel (PLAVIX) 75 MG tablet Take 1 tablet (75 mg total) by mouth daily. 30 tablet 3  . nortriptyline (PAMELOR) 10 MG capsule Take 10 mg by mouth at bedtime.     . polyethylene glycol (MIRALAX / GLYCOLAX) 17 g packet Take 17 g by mouth daily as needed for mild constipation.    . tamsulosin (FLOMAX) 0.4 MG CAPS capsule Take 0.4 mg by mouth daily.     . traMADol (ULTRAM) 50 MG tablet Take 1 tablet (50 mg total) by mouth every 6 (six) hours as needed. 30 tablet 0   No current facility-administered medications for this visit.    Review of Systems  Constitutional:  Constitutional negative. HENT: HENT negative.  Eyes: Eyes negative.  Respiratory: Respiratory negative.  Cardiovascular: Cardiovascular negative.  GI: Gastrointestinal negative.  Musculoskeletal: Musculoskeletal negative.  Skin: Positive for wound.  Neurological: Neurological negative. Hematologic: Hematologic/lymphatic negative.  Psychiatric: Psychiatric negative.        Objective:  Objective  Vitals:   06/28/20 1531  BP: 139/76  Pulse: 87  Resp: 20  Temp: 97.9 F (36.6 C)  SpO2: 98%    Physical Exam HENT:      Nose:     Comments: Wearing a mask    Mouth/Throat:     Mouth: Mucous membranes are moist.  Eyes:     Pupils: Pupils are equal, round, and reactive to light.  Cardiovascular:     Comments: Strong anterior tibial and dorsalis pedis signal on the right, strong peroneal signal on the left Musculoskeletal:        General: Normal range of motion.     Right lower leg: No edema.     Left lower leg: No edema.  Skin:    General: Skin is warm.  Neurological:     General: No focal deficit present.     Mental Status: He is alert.  Psychiatric:        Mood and Affect: Mood normal.        Behavior: Behavior normal.  Thought Content: Thought content normal.        Judgment: Judgment normal.     Data: ---+  RIGHT   PSV cm/sRatioStenosis    Waveform Comments           +-----------+--------+-----+---------------+----------+--------------------  ----+  CFA Distal 278      50-74% stenosisbiphasic                +-----------+--------+-----+---------------+----------+--------------------  ----+  DFA    67              biphasic                +-----------+--------+-----+---------------+----------+--------------------  ----+  SFA Prox  121              biphasic                +-----------+--------+-----+---------------+----------+--------------------  ----+  SFA Mid  202      50-74% stenosisbiphasic                +-----------+--------+-----+---------------+----------+--------------------  ----+  SFA Distal 172              biphasic                +-----------+--------+-----+---------------+----------+--------------------  ----+  POP Prox  65              monophasic               +-----------+--------+-----+---------------+----------+--------------------  ----+  ATA Distal  101              monophasic               +-----------+--------+-----+---------------+----------+--------------------  ----+  PTA Distal 10              monophasicOccluded vessel  with                              collateral flow       +-----------+--------+-----+---------------+----------+--------------------  ----+  PERO Distal       occluded                        +-----------+--------+-----+---------------+----------+--------------------  ----+       Right Stent(s):  +---------------+---++----------++  Prox to Stent 58monophasic  +---------------+---++----------++  Proximal Stent 82monophasic  +---------------+---++----------++  Mid Stent   91 monophasic  +---------------+---++----------++  Distal Stent  55monophasic  +---------------+---++----------++  Distal to Stent79monophasic  +---------------+---++----------++    Summary:  Right: 50-74% stenosis noted in the common femoral artery. 50-74% stenosis  noted in the superficial femoral artery. Patent right popliteal stent.    ABI Findings:  +---------+------------------+-----+-------------------+--------+  Right  Rt Pressure (mmHg)IndexWaveform      Comment   +---------+------------------+-----+-------------------+--------+  Brachial 117                           +---------+------------------+-----+-------------------+--------+  ATA   116        0.99 biphasic            +---------+------------------+-----+-------------------+--------+  PTA   22        0.19 dampened monophasic      +---------+------------------+-----+-------------------+--------+  Great Toe48        0.41 Abnormal             +---------+------------------+-----+-------------------+--------+   +----+------------------+-----+----------+-------+  LeftLt Pressure (mmHg)IndexWaveform Comment  +----+------------------+-----+----------+-------+  ATA 84  0.72 biphasic       +----+------------------+-----+----------+-------+  PTA 49        0.42 monophasic      +----+------------------+-----+----------+-------+      Assessment/Plan:     87 year old male with previous history of left first and second toe amputation now with worsening ulceration of the left third toe.  We will plan for left third toe amputation versus transmetatarsal amputation of the last 3 toes.  his peroneal signal on the left suggesting previous revascularization is intact.  On the right side he has a very strong anterior tibial and dorsalis pedis signal to suggest recent intervention still patent.  Wound on the right appears to be improving.  He remains high risk for right first toe amputation.  We will proceed in the near future with left third toe amputation.  I discussed this with patient and his daughter and they demonstrate good understanding we will get him scheduled today     Waynetta Sandy MD [facility]

## 2020-06-29 ENCOUNTER — Other Ambulatory Visit (HOSPITAL_COMMUNITY)
Admission: RE | Admit: 2020-06-29 | Discharge: 2020-06-29 | Disposition: A | Discharge status: Home / Self Care | Payer: No Typology Code available for payment source | Source: Ambulatory Visit | Attending: Vascular Surgery | Admitting: Vascular Surgery

## 2020-06-29 DIAGNOSIS — Z01812 Encounter for preprocedural laboratory examination: Secondary | ICD-10-CM | POA: Insufficient documentation

## 2020-06-29 DIAGNOSIS — Z20822 Contact with and (suspected) exposure to covid-19: Secondary | ICD-10-CM | POA: Insufficient documentation

## 2020-06-29 LAB — SARS CORONAVIRUS 2 (TAT 6-24 HRS): SARS Coronavirus 2: NEGATIVE

## 2020-06-30 ENCOUNTER — Encounter: Payer: Self-pay | Admitting: Internal Medicine

## 2020-06-30 ENCOUNTER — Encounter (HOSPITAL_COMMUNITY): Payer: Self-pay | Admitting: Vascular Surgery

## 2020-06-30 NOTE — Progress Notes (Signed)
Anesthesia Chart Review: SAME DAY WORK-UP   Case: [id number] Date/Time: 07/01/20 0953   Procedure: TRANSMETATARSAL AMPUTATION VS TOE AMP (Left Toe)   Anesthesia type: General   Pre-op diagnosis: non viable tissue   Location: [facility] OR ROOM 16 / [facility]   Surgeons: Waynetta Sandy, MD      DISCUSSION: Patient is a 87 year old male scheduled for the above procedure.  History includes former smoker (quit 04/19/72), PAD (s/p left Great toe amputation 10/01/19; laser atherectomy/stent right popliteal artery; laser atherectomy/balloon angioplasty left peroneal artery 02/23/20; left 2nd toe amputation 02/24/20; s/p laser atherectomy/balloon angioplasty right ATA 01/19/20 & 06/14/20), CAD (non-obstructive 05/2012), valvular heart disease (mild MR/AR 10/5883), chronic systolic CHF (EF 02-77% 06/1285), exertional dyspnea, bradycardia/Mobitz II (s/p St Jude/Abbott Medical Assurity MRI BiV PPM CRT-P 02/11/20), afib (new diagnosis 05/17/20), HTN, DM2, transitional cell bladder cancer (2001), neuropathy, GI bleed (recurrent 2012), HLD. Lab trends suggest CKD with Creatinine 1.20-2.10 since 09/2019 with primarily in the 1.3-1.6 range.     At last visit with Dr. Rayann Heman 05/17/20. He was noted to be pacer dependent and noted to have afib (9% burden by PPM interrogation). He is on ASA and Plavix per vascular surgery, so Eliquis not started but will consider in the future after discussion with Dr. Donzetta Matters.  CHF stable. Six month follow-up planned.  Awaiting perioperative cardiac device Rx. Has Abbott PPM. 06/29/20 preoperative COVID-19 test negative. He is a same day work-up, so labs and anesthesia team evaluation on the day of surgery.   VS:  BP Readings from Last 3 Encounters:  06/28/20 139/76  06/14/20 (!) 159/70  06/04/20 129/74   Pulse Readings from Last 3 Encounters:  06/28/20 87  06/14/20 88  06/04/20 80    PROVIDERS: [name], MD is PCP  Thompson Grayer, MD is EP cardiologist. Last visit 05/17/20. (Had  previously seen Lauree Chandler, MD in 2014 with cath. Consult for bradycardia by Jenkins Rouge, MD on 01/20/20 and referred to EP.) Linton Ham, MD is Wound Care provider   LABS: For day of surgery. Last comparison labs include: Lab Results  Component Value Date   WBC 20.0 (H) 02/24/2020   HGB 13.9 06/14/2020   HCT 41.0 06/14/2020   PLT 441 (H) 02/24/2020   GLUCOSE 110 (H) 06/14/2020   ALT 23 10/03/2019   AST 28 10/03/2019   NA 142 06/14/2020   K 4.4 06/14/2020   CL 104 06/14/2020   CREATININE 1.50 (H) 06/14/2020   BUN 30 (H) 06/14/2020   CO2 25 02/24/2020   INR 1.0 09/30/2019   HGBA1C 6.9 (H) 09/30/2019     IMAGES: CXR 02/12/20: IMPRESSION: Interval placement of a dual lead LEFT chest cardiac pacing device. No pneumothorax.   EKG: 05/17/20: Sinus, v-paced.   CV: Echo 09/30/19 (during admission for left great toe cellulitis/osteo) IMPRESSIONS  1. Left ventricular ejection fraction, by estimation, is 40 to 45%. The  left ventricle has mildly decreased function. The left ventricle  demonstrates global hypokinesis. Left ventricular diastolic function could  not be evaluated.  2. Right ventricular systolic function is normal. The right ventricular  size is normal.  3. The mitral valve is normal in structure. Mild mitral valve  regurgitation.  4. The aortic valve is tricuspid. Aortic valve regurgitation is mild.  Mild aortic valve sclerosis is present, with no evidence of aortic valve  stenosis.  5. The inferior vena cava is normal in size with greater than 50%  respiratory variability, suggesting right atrial pressure  of 3 mmHg.  - Comparison(s): Prior images unable to be directly viewed, comparison made  by report only.  - Conclusion(s)/Recommendation(s): Unable to view prior images, but EF is  mild-moderately reduced compared to prior. There is significant  dyssynchrony and global hypokinesis, no clear focal wall motion  abnormalities.  (Comparison  05/23/12: LVEF 70-75%, no regional wall motion abnormalities, grade 1 DD, trivial MR, mild TR, mildly dilated LA, mild-moderately dilated RA, mildly dilated RV, aortic root 36 mm) - Reviewed by Jenkins Rouge, MD 01/20/20 during in-patient consult for bradycardia. He wrote, "He is euvolemic with mildly decreased EF and not a candidate for ACE/ARB due to CRF. Given age and lack of chest pain do not think ischemic evaluation is warranted now." Referred to EP.   RHC/LHC 06/06/12: Hemodynamic Findings: Ao:  134/67              LV: 134/14/17 RA:   5            RV: 38/6/9 PA:  39/14 (mean 25)      PCWP:  7 Fick Cardiac Output: 4.4 L/min Fick Cardiac Index: 2.0 L/min/m2 Central Aortic Saturation: 89% Pulmonary Artery Saturation: 59%  Impression: 1. Mild (20% OM & PLA branch) non-obstructive CAD (known to have normal LV function by echo) 2. Mild pulmonary HTN Recommendations: Medical management of mild CAD. Would continue ASA and consider low dose statin.    Past Medical History:  Diagnosis Date  . Acute lower GI bleeding    Recurrent: 2011, 2012  . Bladder cancer ([place])    Transitional cell, 2001  . Bradycardia 01/20/2020  . CAD (coronary atherosclerotic disease)    Nonobstructive, 05/2012  . Exertional dyspnea    EF 93-71%, grade 1 diastolic dysfunction, 08/9676  . HLD (hyperlipidemia)   . HTN (hypertension)   . Neuropathy   . Other insomnia   . PAD (peripheral artery disease) ([place])    ABIs, 2009: 0.89 right; 0.99 left  . Type 2 diabetes mellitus ([place])   . Valvular heart disease   . Vertigo     Past Surgical History:  Procedure Laterality Date  . ABDOMINAL AORTOGRAM W/LOWER EXTREMITY N/A 01/19/2020   Procedure: ABDOMINAL AORTOGRAM W/ Bilateral LOWER EXTREMITY [place];  Surgeon: Waynetta Sandy, MD;  Location: [facility];  Service: Cardiovascular;  Laterality: N/A;  . ABDOMINAL AORTOGRAM W/LOWER EXTREMITY N/A 06/14/2020   Procedure: ABDOMINAL AORTOGRAM W/LOWER  EXTREMITY;  Surgeon: Waynetta Sandy, MD;  Location: [facility];  Service: Cardiovascular;  Laterality: N/A;  . AMPUTATION Left 10/01/2019   Procedure: LEFT GREAT TOE AMPUTATION AT METATARSOPHALANGEAL JOINT;  Surgeon: [name], MD;  Location: [facility];  Service: Orthopedics;  Laterality: Left;  . AMPUTATION Left 02/24/2020   Procedure: LEFT SECOND TOE AMPUTATION;  Surgeon: Waynetta Sandy, MD;  Location: [facility];  Service: Vascular;  Laterality: Left;  . BIV PACEMAKER INSERTION CRT-P N/A 02/11/2020   Procedure: BIV PACEMAKER INSERTION CRT-P;  Surgeon: Thompson Grayer, MD;  Location: [facility];  Service: Cardiovascular;  Laterality: N/A;  . CATARACT EXTRACTION     bi lateral  . COLONOSCOPY    . HEMORRHOID SURGERY    . INGUINAL HERNIA REPAIR    . LOWER EXTREMITY ANGIOGRAPHY  02/23/2020   Procedure: Lower Extremity Angiography;  Surgeon: Waynetta Sandy, MD;  Location: [facility];  Service: Cardiovascular;;  . PERIPHERAL VASCULAR ATHERECTOMY Left 02/23/2020   Procedure: PERIPHERAL VASCULAR ATHERECTOMY;  Surgeon: Waynetta Sandy, MD;  Location: [facility];  Service: Cardiovascular;  Laterality: Left;  . PERIPHERAL VASCULAR BALLOON ANGIOPLASTY  02/23/2020   Procedure: PERIPHERAL VASCULAR BALLOON ANGIOPLASTY;  Surgeon: Waynetta Sandy, MD;  Location: [facility];  Service: Cardiovascular;;  left Peroneal   . PERIPHERAL VASCULAR INTERVENTION Right 01/19/2020   Procedure: PERIPHERAL VASCULAR INTERVENTION;  Surgeon: Waynetta Sandy, MD;  Location: [facility];  Service: Cardiovascular;  Laterality: Right;  . PERIPHERAL VASCULAR INTERVENTION Right 06/14/2020   Procedure: PERIPHERAL VASCULAR INTERVENTION;  Surgeon: Waynetta Sandy, MD;  Location: [facility];  Service: Cardiovascular;  Laterality: Right;    MEDICATIONS: No current facility-administered medications for this encounter.    [name] albuterol (VENTOLIN HFA) 108 (90 Base) MCG/ACT inhaler  . allopurinol (ZYLOPRIM) 300 MG tablet  . Alogliptin Benzoate 12.5 MG TABS  . amLODipine (NORVASC) 10 MG tablet  . aspirin EC 81 MG tablet  . atorvastatin (LIPITOR) 10 MG tablet  . carvedilol (COREG) 3.125 MG tablet  . cholecalciferol (VITAMIN D3) 25 MCG (1000 UNIT) tablet  . clopidogrel (PLAVIX) 75 MG tablet  . nortriptyline (PAMELOR) 10 MG capsule  . polyethylene glycol (MIRALAX / GLYCOLAX) 17 g packet  . tamsulosin (FLOMAX) 0.4 MG CAPS capsule  . traMADol (ULTRAM) 50 MG tablet    Myra Gianotti, PA-C Surgical Short Stay/Anesthesiology [facility] Phone [phone] [facility] Phone [phone] 06/30/2020 11:37 AM

## 2020-06-30 NOTE — Progress Notes (Signed)
Patient denies shortness of breath, fever, cough or chest pain.  PCP - Dr [name] Cardiologist - Dr Johnsie Cancel EP Cardio - Dr Thompson Grayer  Chest x-ray - 02/12/20  EKG - 05/17/20 Stress Test - n/a ECHO - 09/30/19 Cardiac Cath - n/a  ICD Pacemaker - Yes.  St Jude/Abbott PPM model #2272.  Last remote check on 05/12/20.  IB message was sent to [place] and [place], RN was CC.Windle Guard, ST Jude Rep was notified of surgery date/time.  Fasting Blood Sugar - 110s Checks Blood Sugar checks 3  times a week  . Do not take alogliptin on the morning of surgery.  . If your blood sugar is less than 70 mg/dL, you will need to treat for low blood sugar: o Treat a low blood sugar (less than 70 mg/dL) with  cup of clear juice (cranberry or apple), 4 glucose tablets, OR glucose gel. o Recheck blood sugar in 15 minutes after treatment (to make sure it is greater than 70 mg/dL). If your blood sugar is not greater than 70 mg/dL on recheck, call [phone] for further instructions.  Blood Thinner Instructions:  Follow your surgeon's instructions on when to stop plavix prior to surgery.  Anesthesia review: Yes  STOP now taking any Aspirin (unless otherwise instructed by your surgeon), Aleve, Naproxen, Ibuprofen, Motrin, Advil, Goody's, BC's, all herbal medications, fish oil, and all vitamins.   Coronavirus Screening Covid test on 06/29/20 was negative.  Patient verbalized understanding of instructions that were given via phone.

## 2020-06-30 NOTE — Progress Notes (Signed)
PERIOPERATIVE PRESCRIPTION FOR IMPLANTED CARDIAC DEVICE PROGRAMMING   Patient Information: Name: Peter James, Peter James   DOB: May 26, 1925  MRN: [id number]    Planned Procedure: Left toe amputation versus left transmetatarsal amputation  Surgeon: Dr Servando Snare  Date of Procedure: 07/01/20  Cautery will be used.  Position during surgery: Supine   Please send documentation back to:  [facility] (Fax # [phone])      Device Information:   Clinic EP Physician:   Thompson Grayer, MD Device Type:  Pacemaker Manufacturer and Phone #:  St. Jude/Abbott: [phone] Pacemaker Dependent?:  Yes Date of Last Device Check:  05/17/2020        Normal Device Function?:  Yes     Electrophysiologist's Recommendations:    Have magnet available.  Provide continuous ECG monitoring when magnet is used or reprogramming is to be performed.   Procedure should not interfere with device function.  No device programming or magnet placement needed.  Per Device Clinic Standing Orders, Drake Leach  06/30/2020 2:40 PM

## 2020-06-30 NOTE — Anesthesia Preprocedure Evaluation (Addendum)
Anesthesia Evaluation  Patient identified by MRN, date of birth, ID band Patient awake    Reviewed: Allergy & Precautions, NPO status , Patient's Chart, lab work & pertinent test results  Airway Mallampati: II  TM Distance: >3 FB Neck ROM: Full    Dental  (+) Edentulous Upper, Partial Lower   Pulmonary former smoker,    breath sounds clear to auscultation       Cardiovascular hypertension,  Rhythm:Regular Rate:Normal  Echo 09/30/19: IMPRESSIONS  1. Left ventricular ejection fraction, by estimation, is 40 to 45%. The  left ventricle has mildly decreased function. The left ventricle  demonstrates global hypokinesis. Left ventricular diastolic function could  not be evaluated.  2. Right ventricular systolic function is normal. The right ventricular  size is normal.  3. The mitral valve is normal in structure. Mild mitral valve  regurgitation.  4. The aortic valve is tricuspid. Aortic valve regurgitation is mild.  Mild aortic valve sclerosis is present, with no evidence of aortic valve  stenosis.  5. The inferior vena cava is normal in size with greater than 50%  respiratory variability, suggesting right atrial pressure of 3 mmHg.  - Comparison(s): Prior images unable to be directly viewed, comparison made  by report only.  - Conclusion(s)/Recommendation(s): Unable to view prior images, but EF is  mild-moderately reduced compared to prior. There is significant  dyssynchrony and global hypokinesis, no clear focal wall motion  abnormalities.   RHC/LHC 06/06/12: Hemodynamic Findings: Ao:  134/67              LV: 134/14/17 RA:   5            RV: 38/6/9 PA:  39/14 (mean 25)      PCWP:  7 Fick Cardiac Output: 4.4 L/min Fick Cardiac Index: 2.0 L/min/m2 Central Aortic Saturation: 89% Pulmonary Artery Saturation: 59% Impression: 1. Mild (20% OM & PLA branch) non-obstructive CAD (known to have normal LV function by echo) 2.  Mild pulmonary HTN    Neuro/Psych    GI/Hepatic   Endo/Other  diabetes  Renal/GU      Musculoskeletal   Abdominal   Peds  Hematology   Anesthesia Other Findings   Reproductive/Obstetrics                            Anesthesia Physical Anesthesia Plan  ASA: III  Anesthesia Plan: MAC and Regional   Post-op Pain Management:    Induction: Intravenous  PONV Risk Score and Plan: Ondansetron and Propofol infusion  Airway Management Planned: Natural Airway and Simple Face Mask  Additional Equipment:   Intra-op Plan:   Post-operative Plan:   Informed Consent: I have reviewed the patients History and Physical, chart, labs and discussed the procedure including the risks, benefits and alternatives for the proposed anesthesia with the patient or authorized representative who has indicated his/her understanding and acceptance.       Plan Discussed with: CRNA and Anesthesiologist  Anesthesia Plan Comments: (PAT note written 06/30/2020 by Myra Gianotti, PA-C. )       Anesthesia Quick Evaluation

## 2020-07-01 ENCOUNTER — Other Ambulatory Visit: Payer: Self-pay

## 2020-07-01 ENCOUNTER — Inpatient Hospital Stay (HOSPITAL_COMMUNITY): Payer: No Typology Code available for payment source | Admitting: Vascular Surgery

## 2020-07-01 ENCOUNTER — Inpatient Hospital Stay (HOSPITAL_COMMUNITY)
Admission: RE | Admit: 2020-07-01 | Discharge: 2020-07-02 | DRG: 256 | Disposition: A | Discharge status: Home / Self Care | Payer: No Typology Code available for payment source | Attending: Vascular Surgery | Admitting: Vascular Surgery

## 2020-07-01 ENCOUNTER — Encounter (HOSPITAL_COMMUNITY): Payer: Self-pay | Admitting: Vascular Surgery

## 2020-07-01 ENCOUNTER — Encounter (HOSPITAL_COMMUNITY)
Admission: RE | Disposition: A | Discharge status: Home / Self Care | Payer: Self-pay | Source: Home / Self Care | Attending: Vascular Surgery

## 2020-07-01 DIAGNOSIS — E1140 Type 2 diabetes mellitus with diabetic neuropathy, unspecified: Secondary | ICD-10-CM | POA: Diagnosis present

## 2020-07-01 DIAGNOSIS — Z950 Presence of cardiac pacemaker: Secondary | ICD-10-CM

## 2020-07-01 DIAGNOSIS — I272 Pulmonary hypertension, unspecified: Secondary | ICD-10-CM | POA: Diagnosis present

## 2020-07-01 DIAGNOSIS — E1151 Type 2 diabetes mellitus with diabetic peripheral angiopathy without gangrene: Principal | ICD-10-CM | POA: Diagnosis present

## 2020-07-01 DIAGNOSIS — E785 Hyperlipidemia, unspecified: Secondary | ICD-10-CM | POA: Diagnosis present

## 2020-07-01 DIAGNOSIS — L97529 Non-pressure chronic ulcer of other part of left foot with unspecified severity: Secondary | ICD-10-CM | POA: Diagnosis present

## 2020-07-01 DIAGNOSIS — Z7984 Long term (current) use of oral hypoglycemic drugs: Secondary | ICD-10-CM

## 2020-07-01 DIAGNOSIS — I110 Hypertensive heart disease with heart failure: Secondary | ICD-10-CM | POA: Diagnosis present

## 2020-07-01 DIAGNOSIS — Z20822 Contact with and (suspected) exposure to covid-19: Secondary | ICD-10-CM | POA: Diagnosis present

## 2020-07-01 DIAGNOSIS — Z79899 Other long term (current) drug therapy: Secondary | ICD-10-CM

## 2020-07-01 DIAGNOSIS — Z87891 Personal history of nicotine dependence: Secondary | ICD-10-CM

## 2020-07-01 DIAGNOSIS — E11621 Type 2 diabetes mellitus with foot ulcer: Secondary | ICD-10-CM | POA: Diagnosis present

## 2020-07-01 DIAGNOSIS — G4709 Other insomnia: Secondary | ICD-10-CM | POA: Diagnosis present

## 2020-07-01 DIAGNOSIS — I472 Ventricular tachycardia: Secondary | ICD-10-CM | POA: Diagnosis not present

## 2020-07-01 DIAGNOSIS — Z7982 Long term (current) use of aspirin: Secondary | ICD-10-CM

## 2020-07-01 DIAGNOSIS — I351 Nonrheumatic aortic (valve) insufficiency: Secondary | ICD-10-CM | POA: Diagnosis present

## 2020-07-01 DIAGNOSIS — Z89429 Acquired absence of other toe(s), unspecified side: Secondary | ICD-10-CM

## 2020-07-01 DIAGNOSIS — I96 Gangrene, not elsewhere classified: Secondary | ICD-10-CM | POA: Diagnosis not present

## 2020-07-01 DIAGNOSIS — I2510 Atherosclerotic heart disease of native coronary artery without angina pectoris: Secondary | ICD-10-CM | POA: Diagnosis present

## 2020-07-01 DIAGNOSIS — Z8551 Personal history of malignant neoplasm of bladder: Secondary | ICD-10-CM

## 2020-07-01 DIAGNOSIS — Z8249 Family history of ischemic heart disease and other diseases of the circulatory system: Secondary | ICD-10-CM

## 2020-07-01 DIAGNOSIS — Z89422 Acquired absence of other left toe(s): Secondary | ICD-10-CM

## 2020-07-01 DIAGNOSIS — I739 Peripheral vascular disease, unspecified: Secondary | ICD-10-CM

## 2020-07-01 DIAGNOSIS — I5022 Chronic systolic (congestive) heart failure: Secondary | ICD-10-CM | POA: Diagnosis present

## 2020-07-01 DIAGNOSIS — Z79891 Long term (current) use of opiate analgesic: Secondary | ICD-10-CM | POA: Diagnosis not present

## 2020-07-01 HISTORY — DX: Heart failure, unspecified: I50.9

## 2020-07-01 HISTORY — PX: TRANSMETATARSAL AMPUTATION: SHX6197

## 2020-07-01 LAB — CBC
HCT: 37.4 % — ABNORMAL LOW (ref 39.0–52.0)
Hemoglobin: 12 g/dL — ABNORMAL LOW (ref 13.0–17.0)
MCH: 28.9 pg (ref 26.0–34.0)
MCHC: 32.1 g/dL (ref 30.0–36.0)
MCV: 90.1 fL (ref 80.0–100.0)
Platelets: 283 10*3/uL (ref 150–400)
RBC: 4.15 MIL/uL — ABNORMAL LOW (ref 4.22–5.81)
RDW: 14 % (ref 11.5–15.5)
WBC: 5.8 10*3/uL (ref 4.0–10.5)
nRBC: 0 % (ref 0.0–0.2)

## 2020-07-01 LAB — BASIC METABOLIC PANEL
Anion gap: 8 (ref 5–15)
BUN: 26 mg/dL — ABNORMAL HIGH (ref 8–23)
CO2: 25 mmol/L (ref 22–32)
Calcium: 9.4 mg/dL (ref 8.9–10.3)
Chloride: 103 mmol/L (ref 98–111)
Creatinine, Ser: 1.45 mg/dL — ABNORMAL HIGH (ref 0.61–1.24)
GFR, Estimated: 45 mL/min — ABNORMAL LOW (ref >60)
Glucose, Bld: 104 mg/dL — ABNORMAL HIGH (ref 70–99)
Potassium: 4.6 mmol/L (ref 3.5–5.1)
Sodium: 136 mmol/L (ref 135–145)

## 2020-07-01 LAB — GLUCOSE, CAPILLARY
Glucose-Capillary: 95 mg/dL (ref 70–99)
Glucose-Capillary: 93 mg/dL (ref 70–99)
Glucose-Capillary: 92 mg/dL (ref 70–99)

## 2020-07-01 LAB — SURGICAL PCR SCREEN
MRSA, PCR: NEGATIVE
Staphylococcus aureus: NEGATIVE

## 2020-07-01 SURGERY — AMPUTATION, FOOT, TRANSMETATARSAL
Anesthesia: Monitor Anesthesia Care | Site: Toe | Laterality: Left

## 2020-07-01 MED ORDER — CEFAZOLIN SODIUM-DEXTROSE 2-4 GM/100ML-% IV SOLN
2.0000 g | INTRAVENOUS | Status: AC
  Administered 2020-07-01: 2 g via INTRAVENOUS

## 2020-07-01 MED ORDER — CHLORHEXIDINE GLUCONATE 4 % EX LIQD: 60.0000 mL | Freq: Once | CUTANEOUS | Status: DC

## 2020-07-01 MED ORDER — SODIUM CHLORIDE 0.9 % IV SOLN: INTRAVENOUS | Status: DC

## 2020-07-01 MED ORDER — CEFAZOLIN SODIUM-DEXTROSE 2-4 GM/100ML-% IV SOLN
INTRAVENOUS | Status: AC
  Administered 2020-07-01: 2 g via INTRAVENOUS
  Filled 2020-07-01: qty 100

## 2020-07-01 MED ORDER — LABETALOL HCL 5 MG/ML IV SOLN: 10.0000 mg | INTRAVENOUS | Status: DC | PRN

## 2020-07-01 MED ORDER — NORTRIPTYLINE HCL 10 MG PO CAPS
10.0000 mg | ORAL_CAPSULE | Freq: Every day | ORAL | Status: DC
  Administered 2020-07-01: 10 mg via ORAL
  Filled 2020-07-01 (×3): qty 1

## 2020-07-01 MED ORDER — MIDAZOLAM HCL 2 MG/2ML IJ SOLN
INTRAMUSCULAR | Status: AC
  Filled 2020-07-01: qty 2

## 2020-07-01 MED ORDER — CLOPIDOGREL BISULFATE 75 MG PO TABS
75.0000 mg | ORAL_TABLET | Freq: Every day | ORAL | Status: DC
  Administered 2020-07-02: 75 mg via ORAL
  Filled 2020-07-01: qty 1

## 2020-07-01 MED ORDER — TRAMADOL HCL 50 MG PO TABS
50.0000 mg | ORAL_TABLET | Freq: Four times a day (QID) | ORAL | Status: DC | PRN
  Administered 2020-07-01 – 2020-07-02 (×2): 50 mg via ORAL
  Filled 2020-07-01 (×2): qty 1

## 2020-07-01 MED ORDER — LIDOCAINE 2% (20 MG/ML) 5 ML SYRINGE
INTRAMUSCULAR | Status: AC
  Filled 2020-07-01: qty 10

## 2020-07-01 MED ORDER — PANTOPRAZOLE SODIUM 40 MG PO TBEC
40.0000 mg | DELAYED_RELEASE_TABLET | Freq: Every day | ORAL | Status: DC
  Administered 2020-07-02: 40 mg via ORAL
  Filled 2020-07-01 (×2): qty 1

## 2020-07-01 MED ORDER — FENTANYL CITRATE (PF) 100 MCG/2ML IJ SOLN
INTRAMUSCULAR | Status: AC
  Administered 2020-07-01: 50 ug via INTRAVENOUS
  Filled 2020-07-01: qty 2

## 2020-07-01 MED ORDER — LINAGLIPTIN 5 MG PO TABS
5.0000 mg | ORAL_TABLET | Freq: Every day | ORAL | Status: DC
  Administered 2020-07-02: 5 mg via ORAL
  Filled 2020-07-01: qty 1

## 2020-07-01 MED ORDER — METOPROLOL TARTRATE 5 MG/5ML IV SOLN
2.0000 mg | INTRAVENOUS | Status: DC | PRN
Start: 2020-07-01 — End: 2020-07-02

## 2020-07-01 MED ORDER — ALOGLIPTIN BENZOATE 12.5 MG PO TABS: 6.2500 mg | ORAL_TABLET | Freq: Every day | ORAL | Status: DC

## 2020-07-01 MED ORDER — ALLOPURINOL 300 MG PO TABS
300.0000 mg | ORAL_TABLET | Freq: Every day | ORAL | Status: DC
  Administered 2020-07-01 – 2020-07-02 (×2): 300 mg via ORAL
  Filled 2020-07-01 (×2): qty 1

## 2020-07-01 MED ORDER — ACETAMINOPHEN 325 MG PO TABS: 325.0000 mg | ORAL_TABLET | ORAL | Status: DC | PRN

## 2020-07-01 MED ORDER — LIDOCAINE-EPINEPHRINE (PF) 1 %-1:200000 IJ SOLN
INTRAMUSCULAR | Status: AC
  Filled 2020-07-01: qty 30

## 2020-07-01 MED ORDER — HEPARIN SODIUM (PORCINE) 5000 UNIT/ML IJ SOLN: 5000.0000 [IU] | Freq: Three times a day (TID) | INTRAMUSCULAR | Status: DC

## 2020-07-01 MED ORDER — ONDANSETRON HCL 4 MG/2ML IJ SOLN
INTRAMUSCULAR | Status: AC
  Filled 2020-07-01: qty 2

## 2020-07-01 MED ORDER — ACETAMINOPHEN 650 MG RE SUPP
325.0000 mg | RECTAL | Status: DC | PRN
Start: 2020-07-01 — End: 2020-07-02

## 2020-07-01 MED ORDER — ATORVASTATIN CALCIUM 10 MG PO TABS
10.0000 mg | ORAL_TABLET | Freq: Every evening | ORAL | Status: DC
  Administered 2020-07-01: 10 mg via ORAL
  Filled 2020-07-01: qty 1

## 2020-07-01 MED ORDER — CARVEDILOL 3.125 MG PO TABS
3.1250 mg | ORAL_TABLET | Freq: Two times a day (BID) | ORAL | Status: DC
  Administered 2020-07-01 – 2020-07-02 (×2): 3.125 mg via ORAL
  Filled 2020-07-01 (×2): qty 1

## 2020-07-01 MED ORDER — 0.9 % SODIUM CHLORIDE (POUR BTL) OPTIME
TOPICAL | Status: DC | PRN
  Administered 2020-07-01: 1000 mL

## 2020-07-01 MED ORDER — CEFAZOLIN SODIUM-DEXTROSE 2-4 GM/100ML-% IV SOLN
2.0000 g | Freq: Three times a day (TID) | INTRAVENOUS | Status: DC
  Administered 2020-07-02 (×2): 2 g via INTRAVENOUS
  Filled 2020-07-01 (×4): qty 100

## 2020-07-01 MED ORDER — ALBUTEROL SULFATE HFA 108 (90 BASE) MCG/ACT IN AERS: 2.0000 | INHALATION_SPRAY | Freq: Four times a day (QID) | RESPIRATORY_TRACT | Status: DC | PRN

## 2020-07-01 MED ORDER — ORAL CARE MOUTH RINSE: 15.0000 mL | Freq: Once | OROMUCOSAL | Status: AC

## 2020-07-01 MED ORDER — ONDANSETRON HCL 4 MG/2ML IJ SOLN: 4.0000 mg | Freq: Four times a day (QID) | INTRAMUSCULAR | Status: DC | PRN

## 2020-07-01 MED ORDER — PROPOFOL 500 MG/50ML IV EMUL
INTRAVENOUS | Status: DC | PRN
  Administered 2020-07-01: 50 ug/kg/min via INTRAVENOUS

## 2020-07-01 MED ORDER — LIDOCAINE 2% (20 MG/ML) 5 ML SYRINGE
INTRAMUSCULAR | Status: DC | PRN
  Administered 2020-07-01: 40 mg via INTRAVENOUS

## 2020-07-01 MED ORDER — PROTAMINE SULFATE 10 MG/ML IV SOLN
INTRAVENOUS | Status: AC
  Filled 2020-07-01: qty 5

## 2020-07-01 MED ORDER — GUAIFENESIN-DM 100-10 MG/5ML PO SYRP: 15.0000 mL | ORAL_SOLUTION | ORAL | Status: DC | PRN

## 2020-07-01 MED ORDER — POTASSIUM CHLORIDE CRYS ER 20 MEQ PO TBCR: 20.0000 meq | EXTENDED_RELEASE_TABLET | Freq: Once | ORAL | Status: DC

## 2020-07-01 MED ORDER — ONDANSETRON HCL 4 MG/2ML IJ SOLN: 4.0000 mg | Freq: Once | INTRAMUSCULAR | Status: DC | PRN

## 2020-07-01 MED ORDER — CHLORHEXIDINE GLUCONATE 0.12 % MT SOLN
15.0000 mL | Freq: Once | OROMUCOSAL | Status: AC
Start: 2020-07-01 — End: 2020-07-01
  Administered 2020-07-01: 15 mL via OROMUCOSAL

## 2020-07-01 MED ORDER — PHENYLEPHRINE 40 MCG/ML (10ML) SYRINGE FOR IV PUSH (FOR BLOOD PRESSURE SUPPORT)
PREFILLED_SYRINGE | INTRAVENOUS | Status: AC
  Filled 2020-07-01: qty 10

## 2020-07-01 MED ORDER — FENTANYL CITRATE (PF) 100 MCG/2ML IJ SOLN: 50.0000 ug | Freq: Once | INTRAMUSCULAR | Status: AC

## 2020-07-01 MED ORDER — POLYETHYLENE GLYCOL 3350 17 G PO PACK: 17.0000 g | PACK | Freq: Every day | ORAL | Status: DC | PRN

## 2020-07-01 MED ORDER — BISACODYL 10 MG RE SUPP: 10.0000 mg | Freq: Every day | RECTAL | Status: DC | PRN

## 2020-07-01 MED ORDER — AMLODIPINE BESYLATE 10 MG PO TABS
10.0000 mg | ORAL_TABLET | Freq: Every day | ORAL | Status: DC
  Administered 2020-07-01 – 2020-07-02 (×2): 10 mg via ORAL
  Filled 2020-07-01 (×2): qty 1

## 2020-07-01 MED ORDER — PROPOFOL 10 MG/ML IV BOLUS
INTRAVENOUS | Status: AC
  Filled 2020-07-01: qty 20

## 2020-07-01 MED ORDER — HYDRALAZINE HCL 20 MG/ML IJ SOLN: 5.0000 mg | INTRAMUSCULAR | Status: DC | PRN

## 2020-07-01 MED ORDER — TAMSULOSIN HCL 0.4 MG PO CAPS
0.4000 mg | ORAL_CAPSULE | Freq: Every day | ORAL | Status: DC
  Administered 2020-07-01 – 2020-07-02 (×2): 0.4 mg via ORAL
  Filled 2020-07-01 (×2): qty 1

## 2020-07-01 MED ORDER — VITAMIN D 25 MCG (1000 UNIT) PO TABS
1000.0000 [IU] | ORAL_TABLET | Freq: Every day | ORAL | Status: DC
  Administered 2020-07-02: 1000 [IU] via ORAL
  Filled 2020-07-01: qty 1

## 2020-07-01 MED ORDER — HYDROMORPHONE HCL 1 MG/ML IJ SOLN: 0.5000 mg | INTRAMUSCULAR | Status: DC | PRN

## 2020-07-01 MED ORDER — PHENOL 1.4 % MT LIQD: 1.0000 | OROMUCOSAL | Status: DC | PRN

## 2020-07-01 MED ORDER — ONDANSETRON HCL 4 MG/2ML IJ SOLN
INTRAMUSCULAR | Status: DC | PRN
  Administered 2020-07-01: 4 mg via INTRAVENOUS

## 2020-07-01 MED ORDER — ASPIRIN EC 81 MG PO TBEC
81.0000 mg | DELAYED_RELEASE_TABLET | Freq: Every day | ORAL | Status: DC
  Administered 2020-07-02: 81 mg via ORAL
  Filled 2020-07-01: qty 1

## 2020-07-01 MED ORDER — FENTANYL CITRATE (PF) 250 MCG/5ML IJ SOLN
INTRAMUSCULAR | Status: AC
  Filled 2020-07-01: qty 5

## 2020-07-01 MED ORDER — FENTANYL CITRATE (PF) 100 MCG/2ML IJ SOLN: 25.0000 ug | INTRAMUSCULAR | Status: DC | PRN

## 2020-07-01 MED ORDER — ROCURONIUM BROMIDE 10 MG/ML (PF) SYRINGE
PREFILLED_SYRINGE | INTRAVENOUS | Status: AC
  Filled 2020-07-01: qty 10

## 2020-07-01 SURGICAL SUPPLY — 32 items
BLADE AVERAGE 25X9 (BLADE) IMPLANT
BLADE SAW SGTL 81X20 HD (BLADE) ×2 IMPLANT
BNDG CONFORM 3 STRL LF (GAUZE/BANDAGES/DRESSINGS) IMPLANT
BNDG ELASTIC 4X5.8 VLCR STR LF (GAUZE/BANDAGES/DRESSINGS) ×2 IMPLANT
BNDG GAUZE ELAST 4 BULKY (GAUZE/BANDAGES/DRESSINGS) ×2 IMPLANT
CANISTER SUCT 3000ML PPV (MISCELLANEOUS) ×2 IMPLANT
COVER SURGICAL LIGHT HANDLE (MISCELLANEOUS) ×2 IMPLANT
COVER WAND RF STERILE (DRAPES) IMPLANT
DRAPE EXTREMITY T 121X128X90 (DISPOSABLE) ×2 IMPLANT
DRAPE HALF SHEET 40X57 (DRAPES) ×2 IMPLANT
DRSG ADAPTIC 3X8 NADH LF (GAUZE/BANDAGES/DRESSINGS) ×2 IMPLANT
ELECT REM PT RETURN 9FT ADLT (ELECTROSURGICAL) ×2
ELECTRODE REM PT RTRN 9FT ADLT (ELECTROSURGICAL) ×1 IMPLANT
GAUZE SPONGE 4X4 12PLY STRL (GAUZE/BANDAGES/DRESSINGS) ×2 IMPLANT
GLOVE BIO SURGEON STRL SZ7.5 (GLOVE) ×2 IMPLANT
GOWN STRL REUS W/ TWL LRG LVL3 (GOWN DISPOSABLE) ×2 IMPLANT
GOWN STRL REUS W/ TWL XL LVL3 (GOWN DISPOSABLE) IMPLANT
GOWN STRL REUS W/TWL LRG LVL3 (GOWN DISPOSABLE) ×2
GOWN STRL REUS W/TWL XL LVL3 (GOWN DISPOSABLE)
KIT BASIN OR (CUSTOM PROCEDURE TRAY) ×2 IMPLANT
KIT TURNOVER KIT B (KITS) ×2 IMPLANT
NEEDLE HYPO 25GX1X1/2 BEV (NEEDLE) IMPLANT
NS IRRIG 1000ML POUR BTL (IV SOLUTION) ×2 IMPLANT
PACK GENERAL/GYN (CUSTOM PROCEDURE TRAY) IMPLANT
PAD ARMBOARD 7.5X6 YLW CONV (MISCELLANEOUS) ×4 IMPLANT
SPECIMEN JAR SMALL (MISCELLANEOUS) ×2 IMPLANT
SUT ETHILON 3 0 PS 1 (SUTURE) ×4 IMPLANT
SYR CONTROL 10ML LL (SYRINGE) IMPLANT
TOWEL GREEN STERILE (TOWEL DISPOSABLE) ×4 IMPLANT
TOWEL GREEN STERILE FF (TOWEL DISPOSABLE) ×2 IMPLANT
UNDERPAD 30X36 HEAVY ABSORB (UNDERPADS AND DIAPERS) ×2 IMPLANT
WATER STERILE IRR 1000ML POUR (IV SOLUTION) ×2 IMPLANT

## 2020-07-01 NOTE — Interval H&P Note (Signed)
History and Physical Interval Note:  07/01/2020 9:25 AM  Peter James.  has presented today for surgery, with the diagnosis of non viable tissue.  The various methods of treatment have been discussed with the patient and family. After consideration of risks, benefits and other options for treatment, the patient has consented to  Procedure(s): TRANSMETATARSAL AMPUTATION VS TOE AMP (Left) as a surgical intervention.  The patient's history has been reviewed, patient examined, no change in status, stable for surgery.  I have reviewed the patient's chart and labs.  Questions were answered to the patient's satisfaction.     Servando Snare

## 2020-07-01 NOTE — Progress Notes (Signed)
PT had 11 no sustaining beats of  V tach, Asymptomatic notified Dr Luan Pulling with vascular, Advised to continue monitoring.

## 2020-07-01 NOTE — Anesthesia Postprocedure Evaluation (Signed)
Anesthesia Post Note  Patient: Peter James.  Procedure(s) Performed: AMPUTATION OF THIRD TOE ON THE LEFT FOOT (Left Toe)     Patient location during evaluation: PACU Anesthesia Type: Regional Level of consciousness: awake and alert Pain management: pain level controlled Vital Signs Assessment: post-procedure vital signs reviewed and stable Respiratory status: spontaneous breathing, nonlabored ventilation, respiratory function stable and patient connected to nasal cannula oxygen Cardiovascular status: stable and blood pressure returned to baseline Postop Assessment: no apparent nausea or vomiting Anesthetic complications: no   No complications documented.  Last Vitals:  Vitals:   07/01/20 1343 07/01/20 1348  BP: (!) 158/54 130/63  Pulse: 70 72  Resp:    Temp: 36.4 C (!) 36.3 C  SpO2: 99% 98%    Last Pain:  Vitals:   07/01/20 1348  TempSrc: Oral  PainSc:    Pain Goal: Patients Stated Pain Goal: 3 (07/01/20 0828)                 Roberts Gaudy COKER

## 2020-07-01 NOTE — Progress Notes (Signed)
Orthopedic Tech Progress Note Patient Details:  Peter James. September 09, 1925 [id number] Left shoe at bedside Ortho Devices Type of Ortho Device: Darco shoe Ortho Device/Splint Location: LLE Ortho Device/Splint Interventions: Ordered       [name] A Dandy Lazaro 07/01/2020, 2:32 PM

## 2020-07-01 NOTE — Transfer of Care (Addendum)
Immediate Anesthesia Transfer of Care Note  Patient: Peter James.  Procedure(s) Performed: AMPUTATION OF THIRD TOE ON THE LEFT FOOT (Left Toe)  Patient Location: PACU  Anesthesia Type:MAC  Level of Consciousness: awake and oriented  Airway & Oxygen Therapy: Patient Spontanous Breathing and Patient connected to face mask oxygen  Post-op Assessment: Report given to RN and Post -op Vital signs reviewed and stable  Post vital signs: Reviewed and stable  Last Vitals:  Vitals Value Taken Time  BP 103/58 07/01/20 1026  Temp    Pulse    Resp 17 07/01/20 1027  SpO2    Vitals shown include unvalidated device data.  Last Pain:  Vitals:   07/01/20 0940  TempSrc:   PainSc: 0-No pain      Patients Stated Pain Goal: 3 (50/15/86 8257)  Complications: No complications documented.

## 2020-07-01 NOTE — Anesthesia Procedure Notes (Signed)
Anesthesia Regional Block: Ankle block   Pre-Anesthetic Checklist: ,, timeout performed, Correct Patient, Correct Site, Correct Laterality, Correct Procedure, Correct Position, site marked, Risks and benefits discussed, Surgical consent,  Pre-op evaluation,  At surgeon's request  Laterality: Left  Prep: chloraprep       Needles:  Injection technique: Single-shot  Needle Type: Other      Needle Gauge: 25     Additional Needles:   Narrative:  Start time: 07/01/2020 9:30 AM End time: 07/01/2020 9:35 AM Injection made incrementally with aspirations every 5 mL.  Performed by: Personally   Additional Notes: 30 cc 2% lidocaine plain injected easily

## 2020-07-01 NOTE — Anesthesia Procedure Notes (Signed)
Procedure Name: MAC Date/Time: 07/01/2020 9:55 AM Performed by: Inda Coke, CRNA Pre-anesthesia Checklist: Patient identified, Emergency Drugs available, Suction available, Timeout performed and Patient being monitored Patient Re-evaluated:Patient Re-evaluated prior to induction Oxygen Delivery Method: Simple face mask Induction Type: IV induction Dental Injury: Teeth and Oropharynx as per pre-operative assessment

## 2020-07-01 NOTE — Op Note (Signed)
    Patient name: Peter James. MRN: [id number] DOB: [birth year] Sex: male  07/01/2020 Pre-operative Diagnosis: Gangrenous left third toe Post-operative diagnosis:  Same Surgeon:  Erlene Quan C. Donzetta Matters, MD Procedure Performed:  Left third toe amputation  Indications: 87 year old male with previous left first and second toe amputations.  He has undergone bilateral lower extremity revascularization.  He is now indicated for amputation of the third toe.  Findings: We had considered taking all 3 remaining toes but given the configuration of his foot appeared that taking 1 toe would be suitable.  There is adequate bleeding to suggest wound healing possibility.   Procedure:  The patient was identified in the holding area and taken to the operating room where is placed supine operative table.  A preoperative block of been placed.  MAC anesthesia was induced.  He was sterilely prepped and draped in the left foot in the usual fashion, antibiotics were administered and a timeout was called.  We began with a tennis racquet type incision around the third toe.  We dissected down to the bone.  We removed at the joint space.  Joint capsule was removed with rongeur and smoothed with rasp.  There was good bleeding control with cautery.  We thoroughly irrigated the wound.  We closed the skin with interrupted 3-0 nylon suture in a mattress fashion.  Sterile dressing was placed.  He was awakened from anesthesia having tolerated procedure well without any complication.  All counts were correct at completion.   EBL: 10cc  Mayia Megill C. Donzetta Matters, MD [facility] [place] Office: [phone] Pager: [phone]

## 2020-07-01 NOTE — Plan of Care (Addendum)
Blood pressure 130/61, pulse 76, temperature 97.9 F (36.6 C), temperature source Oral, resp. rate 20, height 6\' 1"  (1.854 m), weight 92.1 kg, SpO2 95 %.   Problem: Pain Managment: Goal: General experience of comfort will improve Outcome: Progressing A &O x4, Edcuated on pain management tramadol administered. Assessed, educated  and Encouraged pt to voice need of pain medication,

## 2020-07-02 ENCOUNTER — Encounter (HOSPITAL_BASED_OUTPATIENT_CLINIC_OR_DEPARTMENT_OTHER): Payer: Medicare HMO | Admitting: Internal Medicine

## 2020-07-02 ENCOUNTER — Encounter (HOSPITAL_COMMUNITY): Payer: Self-pay | Admitting: Vascular Surgery

## 2020-07-02 LAB — CBC
HCT: 34.3 % — ABNORMAL LOW (ref 39.0–52.0)
Hemoglobin: 11.1 g/dL — ABNORMAL LOW (ref 13.0–17.0)
MCH: 29.2 pg (ref 26.0–34.0)
MCHC: 32.4 g/dL (ref 30.0–36.0)
MCV: 90.3 fL (ref 80.0–100.0)
Platelets: 296 10*3/uL (ref 150–400)
RBC: 3.8 MIL/uL — ABNORMAL LOW (ref 4.22–5.81)
RDW: 13.8 % (ref 11.5–15.5)
WBC: 6.2 10*3/uL (ref 4.0–10.5)
nRBC: 0 % (ref 0.0–0.2)

## 2020-07-02 LAB — BASIC METABOLIC PANEL
Anion gap: 3 — ABNORMAL LOW (ref 5–15)
BUN: 23 mg/dL (ref 8–23)
CO2: 27 mmol/L (ref 22–32)
Calcium: 8.8 mg/dL — ABNORMAL LOW (ref 8.9–10.3)
Chloride: 105 mmol/L (ref 98–111)
Creatinine, Ser: 1.43 mg/dL — ABNORMAL HIGH (ref 0.61–1.24)
GFR, Estimated: 46 mL/min — ABNORMAL LOW (ref >60)
Glucose, Bld: 93 mg/dL (ref 70–99)
Potassium: 4.9 mmol/L (ref 3.5–5.1)
Sodium: 135 mmol/L (ref 135–145)

## 2020-07-02 MED ORDER — TRAMADOL HCL 50 MG PO TABS: 50.0000 mg | ORAL_TABLET | Freq: Four times a day (QID) | ORAL | 0 refills | Status: AC | PRN

## 2020-07-02 NOTE — Progress Notes (Addendum)
    Progress Note    07/02/2020 7:26 AM 1 Day Post-Op  Subjective:  No complaints   Vitals:   07/01/20 2030 07/02/20 0450  BP: 130/64 (!) 131/57  Pulse: 79 81  Resp: 15 18  Temp: 97.8 F (36.6 C) 98.8 F (37.1 C)  SpO2: 96% 95%   Physical Exam: Cardiac:  Regular rate and rhythm Lungs: non labored Incisions: left 3rd toe incision site well approximated, looks good. No drainage. Sutures intact    Extremities: well perfused and warm  Neurologic: alert and oriented  CBC    Component Value Date/Time   WBC 6.2 07/02/2020 0118   RBC 3.80 (L) 07/02/2020 0118   HGB 11.1 (L) 07/02/2020 0118   HGB 13.0 01/29/2020 1153   HCT 34.3 (L) 07/02/2020 0118   HCT 39.8 01/29/2020 1153   PLT 296 07/02/2020 0118   PLT 415 01/29/2020 1153   MCV 90.3 07/02/2020 0118   MCV 90 01/29/2020 1153   MCH 29.2 07/02/2020 0118   MCHC 32.4 07/02/2020 0118   RDW 13.8 07/02/2020 0118   RDW 15.2 01/29/2020 1153   LYMPHSABS 1.8 01/29/2020 1153   MONOABS 0.9 10/06/2019 0737   EOSABS 0.0 01/29/2020 1153   BASOSABS 0.0 01/29/2020 1153    BMET    Component Value Date/Time   NA 135 07/02/2020 0118   NA 139 01/29/2020 1153   K 4.9 07/02/2020 0118   CL 105 07/02/2020 0118   CO2 27 07/02/2020 0118   GLUCOSE 93 07/02/2020 0118   BUN 23 07/02/2020 0118   BUN 49 (H) 01/29/2020 1153   CREATININE 1.43 (H) 07/02/2020 0118   CALCIUM 8.8 (L) 07/02/2020 0118   GFRNONAA 46 (L) 07/02/2020 0118   GFRAA 37 (L) 01/29/2020 1153    INR    Component Value Date/Time   INR 1.0 09/30/2019 1412     Intake/Output Summary (Last 24 hours) at 07/02/2020 0726 Last data filed at 07/02/2020 0600 Gross per 24 hour  Intake 970 ml  Output 1620 ml  Net -650 ml     Assessment/Plan:  87 y.o. male is s/p Left third toe amputation 1 Day Post-Op. Doing well post op. Toe amputation site looks good. He had unsustained v tach last night. No recurrence. Asymptomatic. VSS. He is stable for discharge home today. He  already has Nora 3x/ week and family will assist with dressing changes. He has Darco post op shoe and is okay for heel weight bearing. He will have follow up in 2 weeks with Dr. Jeri Lager, PA-C [facility] [phone] 07/02/2020 7:26 AM   I have independently interviewed and examined patient and agree with PA assessment and plan above.   Tylasia Fletchall C. Donzetta Matters, MD [facility] [place] Office: [phone] Pager: [phone]

## 2020-07-02 NOTE — Discharge Summary (Signed)
Discharge Summary  Patient ID: Peter James [id number] 87 y.o. October 02, 1925  Admit date: 07/01/2020  Discharge date and time: No discharge date for patient encounter.   Admitting Physician: Waynetta Sandy, MD   Discharge Physician:Brandon Dione Plover, MD  Admission Diagnoses: S/P amputation of lesser toe [facility]) [Z89.429]  Discharge Diagnoses: PAD S/p Amputation of left 3rd toe  Admission Condition: good  Discharged Condition: good  Indication for Admission: 87 year old male with previous left first and second toe amputations.  He has undergone bilateral lower extremity revascularization.  He is now indicated for amputation of the third toe.  Hospital Course: 87 year old male was admitted on 07/01/20 and was taken to the operating room and underwent left 3rd toe amputation. He tolerated the procedure well and was taken to the recovery room in stable condition. He was kept for overnight observation.  He did well overnight. Had one episode of v tach which was not symptomatic. No recurrence. Pain well controlled. 3rd toe amputation site well appearing. Wound was cleaned and re dressed. He has home health services through [facility] that comes out to his home on Monday/ Wednesday and Friday. They will be given instructions on wound care. He also has a lot of family support even though he lives independently. He will resume home medications as prescribed. PDMP was reviewed and pain medication was sent to patients pharmacy. He will follow up with Dr. Donzetta Matters in 2 weeks for a wound check and suture removal.   Consults: None  Treatments: surgery: left 3rd toe amputation    Disposition: Discharge disposition: 01-Home or Self Care       Patient Instructions:  Allergies as of 07/02/2020   No Known Allergies     Medication List    TAKE these medications   albuterol 108 (90 Base) MCG/ACT inhaler Commonly known as: VENTOLIN HFA Inhale 2 puffs into the lungs every 6  (six) hours as needed for wheezing or shortness of breath.   allopurinol 300 MG tablet Commonly known as: ZYLOPRIM Take 300 mg by mouth daily.   Alogliptin Benzoate 12.5 MG Tabs Take 6.25 mg by mouth daily.   amLODipine 10 MG tablet Commonly known as: NORVASC Take 1 tablet (10 mg total) by mouth daily.   aspirin EC 81 MG tablet Take 81 mg by mouth daily. Swallow whole.   atorvastatin 10 MG tablet Commonly known as: LIPITOR Take 1 tablet (10 mg total) by mouth every evening.   carvedilol 3.125 MG tablet Commonly known as: COREG Take 3.125 mg by mouth 2 (two) times daily with a meal.   cholecalciferol 25 MCG (1000 UNIT) tablet Commonly known as: VITAMIN D3 Take 1,000 Units by mouth daily.   clopidogrel 75 MG tablet Commonly known as: Plavix Take 1 tablet (75 mg total) by mouth daily.   nortriptyline 10 MG capsule Commonly known as: PAMELOR Take 10 mg by mouth at bedtime.   polyethylene glycol 17 g packet Commonly known as: MIRALAX / GLYCOLAX Take 17 g by mouth daily as needed for mild constipation.   tamsulosin 0.4 MG Caps capsule Commonly known as: FLOMAX Take 0.4 mg by mouth daily.   traMADol 50 MG tablet Commonly known as: Ultram Take 1 tablet (50 mg total) by mouth every 6 (six) hours as needed for moderate pain.            Discharge Care Instructions  (From admission, onward)         Start     Ordered   07/02/20  0000  Discharge wound care:       Comments: Clean incision daily with mild soap and water. Pat dry. Do not soak in tub. Do not apply any topical creams or ointments to incision. May dress with gauze or leave open to air   07/02/20 0802         Activity: activity as tolerated . Ambulate with Darco shoe. Heel weight bearing only Diet: regular diet Wound Care: keep wound clean and dry and as directed  Follow-up with Dr. Donzetta Matters in 2 weeks.  SignedKaroline Caldwell, PA-C 07/02/2020 9:37 AM [facility] Office: [phone]

## 2020-07-05 ENCOUNTER — Telehealth: Payer: Self-pay

## 2020-07-05 DIAGNOSIS — E11621 Type 2 diabetes mellitus with foot ulcer: Secondary | ICD-10-CM | POA: Diagnosis not present

## 2020-07-05 DIAGNOSIS — E1151 Type 2 diabetes mellitus with diabetic peripheral angiopathy without gangrene: Secondary | ICD-10-CM | POA: Diagnosis not present

## 2020-07-05 DIAGNOSIS — L97514 Non-pressure chronic ulcer of other part of right foot with necrosis of bone: Secondary | ICD-10-CM | POA: Diagnosis not present

## 2020-07-05 DIAGNOSIS — I5030 Unspecified diastolic (congestive) heart failure: Secondary | ICD-10-CM | POA: Diagnosis not present

## 2020-07-05 DIAGNOSIS — E785 Hyperlipidemia, unspecified: Secondary | ICD-10-CM | POA: Diagnosis not present

## 2020-07-05 DIAGNOSIS — I110 Hypertensive heart disease with heart failure: Secondary | ICD-10-CM | POA: Diagnosis not present

## 2020-07-05 DIAGNOSIS — E1140 Type 2 diabetes mellitus with diabetic neuropathy, unspecified: Secondary | ICD-10-CM | POA: Diagnosis not present

## 2020-07-05 DIAGNOSIS — L97522 Non-pressure chronic ulcer of other part of left foot with fat layer exposed: Secondary | ICD-10-CM | POA: Diagnosis not present

## 2020-07-05 DIAGNOSIS — I2510 Atherosclerotic heart disease of native coronary artery without angina pectoris: Secondary | ICD-10-CM | POA: Diagnosis not present

## 2020-07-05 NOTE — Telephone Encounter (Addendum)
S/W Eliezer Lofts @ [facility] to give wound care instructions per staff message from [place], [place] - wash L 3rd toe amputation site with mild soap and water and pat dry and wrap with kerlix daily.

## 2020-07-07 ENCOUNTER — Encounter (HOSPITAL_COMMUNITY): Payer: Self-pay | Admitting: Vascular Surgery

## 2020-07-07 DIAGNOSIS — E11621 Type 2 diabetes mellitus with foot ulcer: Secondary | ICD-10-CM | POA: Diagnosis not present

## 2020-07-07 DIAGNOSIS — I2510 Atherosclerotic heart disease of native coronary artery without angina pectoris: Secondary | ICD-10-CM | POA: Diagnosis not present

## 2020-07-07 DIAGNOSIS — I110 Hypertensive heart disease with heart failure: Secondary | ICD-10-CM | POA: Diagnosis not present

## 2020-07-07 DIAGNOSIS — E1140 Type 2 diabetes mellitus with diabetic neuropathy, unspecified: Secondary | ICD-10-CM | POA: Diagnosis not present

## 2020-07-07 DIAGNOSIS — E1151 Type 2 diabetes mellitus with diabetic peripheral angiopathy without gangrene: Secondary | ICD-10-CM | POA: Diagnosis not present

## 2020-07-07 DIAGNOSIS — E785 Hyperlipidemia, unspecified: Secondary | ICD-10-CM | POA: Diagnosis not present

## 2020-07-07 DIAGNOSIS — L97522 Non-pressure chronic ulcer of other part of left foot with fat layer exposed: Secondary | ICD-10-CM | POA: Diagnosis not present

## 2020-07-07 DIAGNOSIS — L97514 Non-pressure chronic ulcer of other part of right foot with necrosis of bone: Secondary | ICD-10-CM | POA: Diagnosis not present

## 2020-07-07 DIAGNOSIS — I5030 Unspecified diastolic (congestive) heart failure: Secondary | ICD-10-CM | POA: Diagnosis not present

## 2020-07-09 DIAGNOSIS — I110 Hypertensive heart disease with heart failure: Secondary | ICD-10-CM | POA: Diagnosis not present

## 2020-07-09 DIAGNOSIS — E11621 Type 2 diabetes mellitus with foot ulcer: Secondary | ICD-10-CM | POA: Diagnosis not present

## 2020-07-09 DIAGNOSIS — I2510 Atherosclerotic heart disease of native coronary artery without angina pectoris: Secondary | ICD-10-CM | POA: Diagnosis not present

## 2020-07-09 DIAGNOSIS — E785 Hyperlipidemia, unspecified: Secondary | ICD-10-CM | POA: Diagnosis not present

## 2020-07-09 DIAGNOSIS — E1151 Type 2 diabetes mellitus with diabetic peripheral angiopathy without gangrene: Secondary | ICD-10-CM | POA: Diagnosis not present

## 2020-07-09 DIAGNOSIS — L97514 Non-pressure chronic ulcer of other part of right foot with necrosis of bone: Secondary | ICD-10-CM | POA: Diagnosis not present

## 2020-07-09 DIAGNOSIS — E1140 Type 2 diabetes mellitus with diabetic neuropathy, unspecified: Secondary | ICD-10-CM | POA: Diagnosis not present

## 2020-07-09 DIAGNOSIS — I5030 Unspecified diastolic (congestive) heart failure: Secondary | ICD-10-CM | POA: Diagnosis not present

## 2020-07-09 DIAGNOSIS — L97522 Non-pressure chronic ulcer of other part of left foot with fat layer exposed: Secondary | ICD-10-CM | POA: Diagnosis not present

## 2020-07-12 DIAGNOSIS — L97522 Non-pressure chronic ulcer of other part of left foot with fat layer exposed: Secondary | ICD-10-CM | POA: Diagnosis not present

## 2020-07-12 DIAGNOSIS — E11621 Type 2 diabetes mellitus with foot ulcer: Secondary | ICD-10-CM | POA: Diagnosis not present

## 2020-07-12 DIAGNOSIS — E1151 Type 2 diabetes mellitus with diabetic peripheral angiopathy without gangrene: Secondary | ICD-10-CM | POA: Diagnosis not present

## 2020-07-12 DIAGNOSIS — L97514 Non-pressure chronic ulcer of other part of right foot with necrosis of bone: Secondary | ICD-10-CM | POA: Diagnosis not present

## 2020-07-12 DIAGNOSIS — I5030 Unspecified diastolic (congestive) heart failure: Secondary | ICD-10-CM | POA: Diagnosis not present

## 2020-07-12 DIAGNOSIS — I110 Hypertensive heart disease with heart failure: Secondary | ICD-10-CM | POA: Diagnosis not present

## 2020-07-12 DIAGNOSIS — E785 Hyperlipidemia, unspecified: Secondary | ICD-10-CM | POA: Diagnosis not present

## 2020-07-12 DIAGNOSIS — I2510 Atherosclerotic heart disease of native coronary artery without angina pectoris: Secondary | ICD-10-CM | POA: Diagnosis not present

## 2020-07-12 DIAGNOSIS — E1140 Type 2 diabetes mellitus with diabetic neuropathy, unspecified: Secondary | ICD-10-CM | POA: Diagnosis not present

## 2020-07-14 DIAGNOSIS — L97522 Non-pressure chronic ulcer of other part of left foot with fat layer exposed: Secondary | ICD-10-CM | POA: Diagnosis not present

## 2020-07-14 DIAGNOSIS — I2510 Atherosclerotic heart disease of native coronary artery without angina pectoris: Secondary | ICD-10-CM | POA: Diagnosis not present

## 2020-07-14 DIAGNOSIS — I5030 Unspecified diastolic (congestive) heart failure: Secondary | ICD-10-CM | POA: Diagnosis not present

## 2020-07-14 DIAGNOSIS — E11621 Type 2 diabetes mellitus with foot ulcer: Secondary | ICD-10-CM | POA: Diagnosis not present

## 2020-07-14 DIAGNOSIS — E785 Hyperlipidemia, unspecified: Secondary | ICD-10-CM | POA: Diagnosis not present

## 2020-07-14 DIAGNOSIS — E1140 Type 2 diabetes mellitus with diabetic neuropathy, unspecified: Secondary | ICD-10-CM | POA: Diagnosis not present

## 2020-07-14 DIAGNOSIS — I110 Hypertensive heart disease with heart failure: Secondary | ICD-10-CM | POA: Diagnosis not present

## 2020-07-14 DIAGNOSIS — L97514 Non-pressure chronic ulcer of other part of right foot with necrosis of bone: Secondary | ICD-10-CM | POA: Diagnosis not present

## 2020-07-14 DIAGNOSIS — E1151 Type 2 diabetes mellitus with diabetic peripheral angiopathy without gangrene: Secondary | ICD-10-CM | POA: Diagnosis not present

## 2020-07-15 DIAGNOSIS — E11621 Type 2 diabetes mellitus with foot ulcer: Secondary | ICD-10-CM | POA: Diagnosis not present

## 2020-07-15 DIAGNOSIS — L97522 Non-pressure chronic ulcer of other part of left foot with fat layer exposed: Secondary | ICD-10-CM | POA: Diagnosis not present

## 2020-07-15 DIAGNOSIS — E1151 Type 2 diabetes mellitus with diabetic peripheral angiopathy without gangrene: Secondary | ICD-10-CM | POA: Diagnosis not present

## 2020-07-15 DIAGNOSIS — L97514 Non-pressure chronic ulcer of other part of right foot with necrosis of bone: Secondary | ICD-10-CM | POA: Diagnosis not present

## 2020-07-16 DIAGNOSIS — I5030 Unspecified diastolic (congestive) heart failure: Secondary | ICD-10-CM | POA: Diagnosis not present

## 2020-07-16 DIAGNOSIS — E1140 Type 2 diabetes mellitus with diabetic neuropathy, unspecified: Secondary | ICD-10-CM | POA: Diagnosis not present

## 2020-07-16 DIAGNOSIS — E785 Hyperlipidemia, unspecified: Secondary | ICD-10-CM | POA: Diagnosis not present

## 2020-07-16 DIAGNOSIS — I739 Peripheral vascular disease, unspecified: Secondary | ICD-10-CM | POA: Diagnosis not present

## 2020-07-16 DIAGNOSIS — I2510 Atherosclerotic heart disease of native coronary artery without angina pectoris: Secondary | ICD-10-CM | POA: Diagnosis not present

## 2020-07-16 DIAGNOSIS — Z299 Encounter for prophylactic measures, unspecified: Secondary | ICD-10-CM | POA: Diagnosis not present

## 2020-07-16 DIAGNOSIS — L97514 Non-pressure chronic ulcer of other part of right foot with necrosis of bone: Secondary | ICD-10-CM | POA: Diagnosis not present

## 2020-07-16 DIAGNOSIS — S98139A Complete traumatic amputation of one unspecified lesser toe, initial encounter: Secondary | ICD-10-CM | POA: Diagnosis not present

## 2020-07-16 DIAGNOSIS — I110 Hypertensive heart disease with heart failure: Secondary | ICD-10-CM | POA: Diagnosis not present

## 2020-07-16 DIAGNOSIS — J449 Chronic obstructive pulmonary disease, unspecified: Secondary | ICD-10-CM | POA: Diagnosis not present

## 2020-07-16 DIAGNOSIS — L97522 Non-pressure chronic ulcer of other part of left foot with fat layer exposed: Secondary | ICD-10-CM | POA: Diagnosis not present

## 2020-07-16 DIAGNOSIS — E1151 Type 2 diabetes mellitus with diabetic peripheral angiopathy without gangrene: Secondary | ICD-10-CM | POA: Diagnosis not present

## 2020-07-16 DIAGNOSIS — E1165 Type 2 diabetes mellitus with hyperglycemia: Secondary | ICD-10-CM | POA: Diagnosis not present

## 2020-07-16 DIAGNOSIS — E11621 Type 2 diabetes mellitus with foot ulcer: Secondary | ICD-10-CM | POA: Diagnosis not present

## 2020-07-19 ENCOUNTER — Ambulatory Visit: Payer: No Typology Code available for payment source

## 2020-07-19 DIAGNOSIS — I5030 Unspecified diastolic (congestive) heart failure: Secondary | ICD-10-CM | POA: Diagnosis not present

## 2020-07-19 DIAGNOSIS — E1151 Type 2 diabetes mellitus with diabetic peripheral angiopathy without gangrene: Secondary | ICD-10-CM | POA: Diagnosis not present

## 2020-07-19 DIAGNOSIS — Z89422 Acquired absence of other left toe(s): Secondary | ICD-10-CM | POA: Diagnosis not present

## 2020-07-19 DIAGNOSIS — I2510 Atherosclerotic heart disease of native coronary artery without angina pectoris: Secondary | ICD-10-CM | POA: Diagnosis not present

## 2020-07-19 DIAGNOSIS — E1140 Type 2 diabetes mellitus with diabetic neuropathy, unspecified: Secondary | ICD-10-CM | POA: Diagnosis not present

## 2020-07-19 DIAGNOSIS — L97514 Non-pressure chronic ulcer of other part of right foot with necrosis of bone: Secondary | ICD-10-CM | POA: Diagnosis not present

## 2020-07-19 DIAGNOSIS — Z4781 Encounter for orthopedic aftercare following surgical amputation: Secondary | ICD-10-CM | POA: Diagnosis not present

## 2020-07-19 DIAGNOSIS — E11621 Type 2 diabetes mellitus with foot ulcer: Secondary | ICD-10-CM | POA: Diagnosis not present

## 2020-07-19 DIAGNOSIS — I110 Hypertensive heart disease with heart failure: Secondary | ICD-10-CM | POA: Diagnosis not present

## 2020-07-20 ENCOUNTER — Ambulatory Visit (INDEPENDENT_AMBULATORY_CARE_PROVIDER_SITE_OTHER): Payer: No Typology Code available for payment source | Admitting: Physician Assistant

## 2020-07-20 ENCOUNTER — Encounter (HOSPITAL_BASED_OUTPATIENT_CLINIC_OR_DEPARTMENT_OTHER): Payer: Medicare HMO | Attending: Internal Medicine | Admitting: Internal Medicine

## 2020-07-20 ENCOUNTER — Other Ambulatory Visit: Payer: Self-pay

## 2020-07-20 VITALS — BP 137/74 | HR 80 | Temp 98.6°F | Resp 20 | Ht 73.0 in | Wt 195.4 lb

## 2020-07-20 DIAGNOSIS — I10 Essential (primary) hypertension: Secondary | ICD-10-CM | POA: Diagnosis not present

## 2020-07-20 DIAGNOSIS — L97514 Non-pressure chronic ulcer of other part of right foot with necrosis of bone: Secondary | ICD-10-CM | POA: Insufficient documentation

## 2020-07-20 DIAGNOSIS — E1151 Type 2 diabetes mellitus with diabetic peripheral angiopathy without gangrene: Secondary | ICD-10-CM | POA: Insufficient documentation

## 2020-07-20 DIAGNOSIS — Z89422 Acquired absence of other left toe(s): Secondary | ICD-10-CM | POA: Insufficient documentation

## 2020-07-20 DIAGNOSIS — I739 Peripheral vascular disease, unspecified: Secondary | ICD-10-CM

## 2020-07-20 DIAGNOSIS — E1140 Type 2 diabetes mellitus with diabetic neuropathy, unspecified: Secondary | ICD-10-CM | POA: Insufficient documentation

## 2020-07-20 DIAGNOSIS — L97512 Non-pressure chronic ulcer of other part of right foot with fat layer exposed: Secondary | ICD-10-CM | POA: Diagnosis not present

## 2020-07-20 DIAGNOSIS — E11621 Type 2 diabetes mellitus with foot ulcer: Secondary | ICD-10-CM | POA: Diagnosis not present

## 2020-07-20 NOTE — Progress Notes (Signed)
  POST OPERATIVE OFFICE NOTE    CC:  F/u for surgery  HPI:  This is a 87 y.o. male who is s/p amputation of left 3rd toe 07/01/2020.  He presents today for postoperative follow-up.  He has no concerns.  His daughter accompanies him today.  He has a chronic wound to the right foot and is receiving care at the wound care clinic.  He went today and has a new dressing in his wearing postop shoes on both feet.   Previous vascular intervention:  06/14/2020: Right lower extremity CO2 and contrasted angiogram with laser atherectomy right anterior tibial artery, balloon angioplasty with 3 mm balloon right anterior tibial artery.  01/19/2020: Right popliteal artery Tigris stent; right popliteal and ATA laser atherectomy; plain balloon angioplasty of right ATA  02/23/2020: Left peroneal artery laser atherectomy and plain balloon angioplasty  He is compliant with aspirin, statin and Plavix.  No Known Allergies  Current Outpatient Medications  Medication Sig Dispense Refill  . albuterol (VENTOLIN HFA) 108 (90 Base) MCG/ACT inhaler Inhale 2 puffs into the lungs every 6 (six) hours as needed for wheezing or shortness of breath.    . allopurinol (ZYLOPRIM) 300 MG tablet Take 300 mg by mouth daily.     . Alogliptin Benzoate 12.5 MG TABS Take 6.25 mg by mouth daily.    [name] amLODipine (NORVASC) 10 MG tablet Take 1 tablet (10 mg total) by mouth daily. 90 tablet 3  . aspirin EC 81 MG tablet Take 81 mg by mouth daily. Swallow whole.    [name] atorvastatin (LIPITOR) 10 MG tablet Take 1 tablet (10 mg total) by mouth every evening. 30 tablet 6  . carvedilol (COREG) 3.125 MG tablet Take 3.125 mg by mouth 2 (two) times daily with a meal.    . cholecalciferol (VITAMIN D3) 25 MCG (1000 UNIT) tablet Take 1,000 Units by mouth daily.    . clopidogrel (PLAVIX) 75 MG tablet Take 1 tablet (75 mg total) by mouth daily. 30 tablet 3  . nortriptyline (PAMELOR) 10 MG capsule Take 10 mg by mouth at bedtime.     . polyethylene  glycol (MIRALAX / GLYCOLAX) 17 g packet Take 17 g by mouth daily as needed for mild constipation.    . tamsulosin (FLOMAX) 0.4 MG CAPS capsule Take 0.4 mg by mouth daily.     . traMADol (ULTRAM) 50 MG tablet Take 1 tablet (50 mg total) by mouth every 6 (six) hours as needed for moderate pain. 20 tablet 0   No current facility-administered medications for this visit.     ROS:  See HPI Vitals:   07/20/20 1325  Weight: 195 lb 6.4 oz (88.6 kg)  Height: 6\' 1"  (1.854 m)    Physical Exam:  General appearance: Well-developed, well-nourished in no apparent distress Cardiac: Rate and rhythm are regular Respiratory: Nonlabored Incision: Amputation site healing nicely.  Sutures intact. Extremities: Left foot is warm and well-perfused.  Motor and sensation intact Neuro: Alert and oriented x4.     Assessment/Plan:  This is a 87 y.o. male who is s/p: Left third toe amputation.  Wound is healing nicely.  Discontinue sutures today.  I instructed him that once he no longer sees the appearance of pink tissue at the amputation site he can discontinue wearing postoperative shoe.  Follow up in 6 months with bilateral lower extremity duplex arterial studies.  Risa Grill, PA-C [facility] [phone]  Clinic MD:  Dr. Carlis Abbott

## 2020-07-22 ENCOUNTER — Other Ambulatory Visit: Payer: Self-pay

## 2020-07-22 DIAGNOSIS — I739 Peripheral vascular disease, unspecified: Secondary | ICD-10-CM

## 2020-07-23 DIAGNOSIS — Z4781 Encounter for orthopedic aftercare following surgical amputation: Secondary | ICD-10-CM | POA: Diagnosis not present

## 2020-07-23 DIAGNOSIS — E1151 Type 2 diabetes mellitus with diabetic peripheral angiopathy without gangrene: Secondary | ICD-10-CM | POA: Diagnosis not present

## 2020-07-23 DIAGNOSIS — I2510 Atherosclerotic heart disease of native coronary artery without angina pectoris: Secondary | ICD-10-CM | POA: Diagnosis not present

## 2020-07-23 DIAGNOSIS — E1140 Type 2 diabetes mellitus with diabetic neuropathy, unspecified: Secondary | ICD-10-CM | POA: Diagnosis not present

## 2020-07-23 DIAGNOSIS — Z89422 Acquired absence of other left toe(s): Secondary | ICD-10-CM | POA: Diagnosis not present

## 2020-07-23 DIAGNOSIS — I110 Hypertensive heart disease with heart failure: Secondary | ICD-10-CM | POA: Diagnosis not present

## 2020-07-23 DIAGNOSIS — E11621 Type 2 diabetes mellitus with foot ulcer: Secondary | ICD-10-CM | POA: Diagnosis not present

## 2020-07-23 DIAGNOSIS — L97514 Non-pressure chronic ulcer of other part of right foot with necrosis of bone: Secondary | ICD-10-CM | POA: Diagnosis not present

## 2020-07-23 DIAGNOSIS — I5030 Unspecified diastolic (congestive) heart failure: Secondary | ICD-10-CM | POA: Diagnosis not present

## 2020-07-23 NOTE — Progress Notes (Signed)
Peter, James ([id number]) Visit Report for 07/20/2020 Arrival Information Details Patient Name: Date of Service: [name] 07/20/2020 10:30 A M Medical Record Number: [id number] Patient Account Number: [id number] Date of Birth/Sex: Treating RN: 04-03-1925 (87 y.o. [name] Primary Care Allisha Harter: [name] Other Clinician: Referring Rajvi Armentor: Treating Necola Bluestein/Extender: Greta Doom Weeks in Treatment: 10 Visit Information History Since Last Visit Added or deleted any medications: No Patient Arrived: Peter James Any new allergies or adverse reactions: No Arrival Time: 11:04 Had a fall or experienced change in No Accompanied By: friend activities of daily living that may affect Transfer Assistance: None risk of falls: Patient Identification Verified: Yes Signs or symptoms of abuse/neglect since last visito No Secondary Verification Process Completed: Yes Hospitalized since last visit: Yes Patient Requires Transmission-Based No Implantable device outside of the clinic excluding No Precautions: cellular tissue based products placed in the center Patient Has Alerts: Yes since last visit: Patient Alerts: ABI's 12/21: R:0.75 L0.77 Has Dressing in Place as Prescribed: Yes Pain Present Now: No Electronic Signature(s) Signed: 07/20/2020 5:08:22 PM By: Baruch Gouty RN, BSN Entered By: Baruch Gouty on 07/20/2020 11:05:45 -------------------------------------------------------------------------------- Encounter Discharge Information Details Patient Name: Date of Service: [name] 07/20/2020 10:30 A M Medical Record Number: [id number] Patient Account Number: [id number] Date of Birth/Sex: Treating RN: [birth year] (87 y.o. [name] Primary Care Elysha Daw: [name] Other Clinician: Referring Crystal Scarberry: Treating Evaline Waltman/Extender: Greta Doom Weeks in Treatment: 10 Encounter Discharge Information Items Post Procedure  Vitals Discharge Condition: Stable Temperature (F): 98.1 Ambulatory Status: Cane Pulse (bpm): 80 Discharge Destination: Home Respiratory Rate (breaths/min): 18 Transportation: Private Auto Blood Pressure (mmHg): 127/69 Accompanied By: friend Schedule Follow-up Appointment: Yes Clinical Summary of Care: Patient Declined Electronic Signature(s) Signed: 07/20/2020 5:08:22 PM By: Baruch Gouty RN, BSN Entered By: Baruch Gouty on 07/20/2020 12:00:22 -------------------------------------------------------------------------------- Lower Extremity Assessment Details Patient Name: Date of Service: [name] 07/20/2020 10:30 A M Medical Record Number: [id number] Patient Account Number: [id number] Date of Birth/Sex: Treating RN: [birth year] (87 y.o. [name] Primary Care Belen Zwahlen: [name] Other Clinician: Referring Mackinzee Roszak: Treating Lameka Disla/Extender: Greta Doom Weeks in Treatment: 10 Edema Assessment Assessed: [Left: No] [Right: No] Edema: [Left: James] [Right: o] Calf Left: Right: Point of Measurement: 42 cm From Medial Instep 36.3 cm Ankle Left: Right: Point of Measurement: 23 cm From Medial Instep 23.5 cm Vascular Assessment Pulses: Dorsalis Pedis Palpable: [Right:Yes] Electronic Signature(s) Signed: 07/20/2020 5:08:22 PM By: Baruch Gouty RN, BSN Entered By: Baruch Gouty on 07/20/2020 11:09:19 -------------------------------------------------------------------------------- Multi Wound Chart Details Patient Name: Date of Service: [name] 07/20/2020 10:30 A M Medical Record Number: [id number] Patient Account Number: [id number] Date of Birth/Sex: Treating RN: 08-22-1925 (87 y.o. [name] Primary Care Katianna Mcclenney: [name] Other Clinician: Referring Kyrin Gratz: Treating Bethzy Hauck/Extender: Greta Doom Weeks in Treatment: 10 Vital Signs Height(in): 73 Capillary Blood Glucose(mg/dl):  119 Weight(lbs): 217 Pulse(bpm): 80 Body Mass Index(BMI): 29 Blood Pressure(mmHg): 127/69 Temperature(F): 98.1 Respiratory Rate(breaths/min): 18 Photos: [1:No Photos Right, Medial Foot] [James/A:James/A James/A] Wound Location: [1:Blister] [James/A:James/A] Wounding Event: [1:Diabetic Wound/Ulcer of the Lower] [James/A:James/A] Primary Etiology: [1:Extremity Coronary Artery Disease,] [James/A:James/A] Comorbid History: [1:Hypertension, Peripheral Arterial Disease, Type II Diabetes, Neuropathy 02/18/2020] [James/A:James/A] Date Acquired: [1:10] [James/A:James/A] Weeks of Treatment: [1:Open] [James/A:James/A] Wound Status: [1:0.4x0.3x0.5] [James/A:James/A] Measurements L x W x D (cm) [1:0.094] [James/A:James/A] A (cm) : rea [1:0.047] [James/A:James/A] Volume (cm) : [1:0.00%] [James/A:James/A] % Reduction in A rea: [1:37.30%] [James/A:James/A] %  Reduction in Volume: [1:12] Starting Position 1 (o'clock): [1:12] Ending Position 1 (o'clock): [1:0.7] Maximum Distance 1 (cm): [1:Yes] [James/A:James/A] Undermining: [1:Grade 2] [James/A:James/A] Classification: [1:Medium] [James/A:James/A] Exudate A mount: [1:Serosanguineous] [James/A:James/A] Exudate Type: [1:red, brown] [James/A:James/A] Exudate Color: [1:Well defined, not attached] [James/A:James/A] Wound Margin: [1:Large (67-100%)] [James/A:James/A] Granulation A mount: [1:Pink] [James/A:James/A] Granulation Quality: [1:Small (1-33%)] [James/A:James/A] Necrotic A mount: [1:Fat Layer (Subcutaneous Tissue): Yes James/A] Exposed Structures: [1:Fascia: No Tendon: No Muscle: No Joint: No Bone: No None] [James/A:James/A] Epithelialization: [1:Debridement - Selective/Open Wound James/A] Debridement: Pre-procedure Verification/Time Out 11:40 [James/A:James/A] Taken: [1:Lidocaine] [James/A:James/A] Pain Control: [1:Skin/Epidermis] [James/A:James/A] Level: [1:0.12] [James/A:James/A] Debridement A (sq cm): [1:rea Curette] [James/A:James/A] Instrument: [1:Minimum] [James/A:James/A] Bleeding: [1:Pressure] [James/A:James/A] Hemostasis A chieved: [1:0] [James/A:James/A] Procedural Pain: [1:0] [James/A:James/A] Post Procedural Pain: [1:Procedure was tolerated well] [James/A:James/A] Debridement  Treatment Response: [1:0.4x0.3x0.5] [James/A:James/A] Post Debridement Measurements L x W x D (cm) [1:0.047] [James/A:James/A] Post Debridement Volume: (cm) [1:Debridement] [James/A:James/A] Treatment Notes Electronic Signature(s) Signed: 07/20/2020 4:28:01 PM By: Linton Ham MD Signed: 07/23/2020 3:14:58 PM By: Rhae Hammock RN Entered By: Linton Ham on 07/20/2020 11:43:59 -------------------------------------------------------------------------------- Multi-Disciplinary Care Plan Details Patient Name: Date of Service: [name] 07/20/2020 10:30 A M Medical Record Number: [id number] Patient Account Number: [id number] Date of Birth/Sex: Treating RN: [birth year] (87 y.o. [name], [name] Primary Care Deaysia Grigoryan: [name] Other Clinician: Referring Mikal Blasdell: Treating Haniel Fix/Extender: Greta Doom Weeks in Treatment: 10 Multidisciplinary Care Plan reviewed with physician Active Inactive Nutrition Nursing Diagnoses: Impaired glucose control: actual or potential Potential for alteratiion in Nutrition/Potential for imbalanced nutrition Goals: Patient/caregiver agrees to and verbalizes understanding of need to use nutritional supplements and/or vitamins as prescribed Date Initiated: 05/10/2020 Date Inactivated: 06/07/2020 Target Resolution Date: 06/11/2020 Goal Status: Met Patient/caregiver will maintain therapeutic glucose control Date Initiated: 05/10/2020 Target Resolution Date: 08/11/2020 Goal Status: Active Interventions: Assess HgA1c results as ordered upon admission and as needed Assess patient nutrition upon admission and as needed per policy Provide education on elevated blood sugars and impact on wound healing Provide education on nutrition Treatment Activities: Education provided on Nutrition : 05/17/2020 Notes: Wound/Skin Impairment Nursing Diagnoses: Impaired tissue integrity Knowledge deficit related to ulceration/compromised skin  integrity Goals: Patient/caregiver will verbalize understanding of skin care regimen Date Initiated: 05/10/2020 Target Resolution Date: 08/11/2020 Goal Status: Active Ulcer/skin breakdown will have a volume reduction of 30% by week 4 Date Initiated: 05/10/2020 Date Inactivated: 06/07/2020 Target Resolution Date: 06/11/2020 Goal Status: Unmet Unmet Reason: Osteomyelitis, PAD Interventions: Assess patient/caregiver ability to obtain necessary supplies Assess patient/caregiver ability to perform ulcer/skin care regimen upon admission and as needed Assess ulceration(s) every visit Provide education on ulcer and skin care Notes: Electronic Signature(s) Signed: 07/23/2020 3:14:58 PM By: Rhae Hammock RN Entered By: Rhae Hammock on 07/20/2020 11:44:56 -------------------------------------------------------------------------------- Pain Assessment Details Patient Name: Date of Service: [name] 07/20/2020 10:30 A M Medical Record Number: [id number] Patient Account Number: [id number] Date of Birth/Sex: Treating RN: 1926/03/19 (87 y.o. [name] Primary Care Nyan Dufresne: [name] Other Clinician: Referring Tyniesha Howald: Treating Gurvir Schrom/Extender: Greta Doom Weeks in Treatment: 10 Active Problems Location of Pain Severity and Description of Pain Patient Has Paino No Site Locations Rate the pain. Rate the pain. Current Pain Level: 0 Pain Management and Medication Current Pain Management: Electronic Signature(s) Signed: 07/20/2020 5:08:22 PM By: Baruch Gouty RN, BSN Entered By: Baruch Gouty on 07/20/2020 11:08:36 -------------------------------------------------------------------------------- Patient/Caregiver Education Details Patient Name: Date of Service: [name] 5/3/2022andnbsp10:30 A M Medical Record Number: [id number] Patient Account  Number: [id number] Date of Birth/Gender: Treating RN: 1925/05/27 (87 y.o. [name] Primary Care Physician: [name] Other Clinician: Referring Physician: Treating Physician/Extender: Samul Dada in Treatment: 10 Education Assessment Education Provided To: Patient Education Topics Provided Elevated Blood Sugar/ Impact on Healing: Methods: Explain/Verbal Electronic Signature(s) Signed: 07/23/2020 3:14:58 PM By: Rhae Hammock RN Entered By: Rhae Hammock on 07/20/2020 11:37:23 -------------------------------------------------------------------------------- Wound Assessment Details Patient Name: Date of Service: [name] 07/20/2020 10:30 A M Medical Record Number: [id number] Patient Account Number: [id number] Date of Birth/Sex: Treating RN: 18-Jan-1926 (87 y.o. [name] Primary Care Jayke Caul: [name] Other Clinician: Referring Mellody Masri: Treating Angalena Cousineau/Extender: Greta Doom Weeks in Treatment: 10 Wound Status Wound Number: 1 Primary Diabetic Wound/Ulcer of the Lower Extremity Etiology: Wound Location: Right, Medial Foot Wound Open Wounding Event: Blister Status: Date Acquired: 02/18/2020 Comorbid Coronary Artery Disease, Hypertension, Peripheral Arterial Weeks Of Treatment: 10 History: Disease, Type II Diabetes, Neuropathy Clustered Wound: No Photos Wound Measurements Length: (cm) 0.4 Width: (cm) 0.3 Depth: (cm) 0.5 Area: (cm) 0.094 Volume: (cm) 0.047 % Reduction in Area: 0% % Reduction in Volume: 37.3% Epithelialization: None Tunneling: No Undermining: Yes Starting Position (o'clock): 12 Ending Position (o'clock): 12 Maximum Distance: (cm) 0.7 Wound Description Classification: Grade 2 Wound Margin: Well defined, not attached Exudate Amount: Medium Exudate Type: Serosanguineous Exudate Color: red, brown Foul Odor After Cleansing: No Slough/Fibrino Yes Wound Bed Granulation Amount: Large (67-100%) Exposed Structure Granulation Quality: Pink Fascia Exposed:  No Necrotic Amount: Small (1-33%) Fat Layer (Subcutaneous Tissue) Exposed: Yes Necrotic Quality: Adherent Slough Tendon Exposed: No Muscle Exposed: No Joint Exposed: No Bone Exposed: No Treatment Notes Wound #1 (Foot) Wound Laterality: Right, Medial Cleanser Wound Cleanser Discharge Instruction: Cleanse the wound with wound cleanser or normal saline prior to applying a clean dressing using gauze sponges, not tissue or cotton balls. Peri-Wound Care Topical Primary Dressing Endoform 2x2 in Discharge Instruction: Moisten with saline Secondary Dressing Zetuvit Plus 4x4 in Discharge Instruction: Apply over primary dressing as directed. Secured With Compression Wrap Compression Stockings Environmental education officer) Signed: 07/20/2020 4:47:39 PM By: Sandre Kitty Signed: 07/20/2020 5:08:22 PM By: Baruch Gouty RN, BSN Entered By: Sandre Kitty on 07/20/2020 16:24:01 -------------------------------------------------------------------------------- Vitals Details Patient Name: Date of Service: [name] 07/20/2020 10:30 A M Medical Record Number: [id number] Patient Account Number: [id number] Date of Birth/Sex: Treating RN: 07-28-1925 (87 y.o. [name] Primary Care Rikayla Demmon: [name] Other Clinician: Referring Ethelreda Sukhu: Treating Nahuel Wilbert/Extender: Greta Doom Weeks in Treatment: 10 Vital Signs Time Taken: 11:06 Temperature (F): 98.1 Height (in): 73 Pulse (bpm): 80 Source: Stated Respiratory Rate (breaths/min): 18 Weight (lbs): 217 Blood Pressure (mmHg): 127/69 Source: Stated Capillary Blood Glucose (mg/dl): 119 Body Mass Index (BMI): 28.6 Reference Range: 80 - 120 mg / dl Notes glucose per pt report this am Electronic Signature(s) Signed: 07/20/2020 5:08:22 PM By: Baruch Gouty RN, BSN Entered By: Baruch Gouty on 07/20/2020 11:08:16

## 2020-07-23 NOTE — Progress Notes (Signed)
Peter James, Peter James ([id number]) Visit Report for 07/20/2020 Debridement Details Patient Name: Date of Service: Peter James [name] 07/20/2020 10:30 A M Medical Record Number: [id number] Patient Account Number: [id number] Date of Birth/Sex: Treating RN: May 05, 1925 (87 y.o. Peter James, [name] Primary Care Provider: Jerene James Other Clinician: Referring Provider: Treating Provider/Extender: Greta Doom Weeks in Treatment: 10 Debridement Performed for Assessment: Wound #1 Right,Medial Foot Performed By: Physician Ricard Dillon., MD Debridement Type: Debridement Severity of Tissue Pre Debridement: Fat layer exposed Level of Consciousness (Pre-procedure): Awake and Alert Pre-procedure Verification/Time Out Yes - 11:40 Taken: Start Time: 11:40 Pain Control: Lidocaine T Area Debrided (L x W): otal 0.4 (cm) x 0.3 (cm) = 0.12 (cm) Tissue and other material debrided: Viable, Non-Viable, Skin: Dermis , Skin: Epidermis Level: Skin/Epidermis Debridement Description: Selective/Open Wound Instrument: Curette Bleeding: Minimum Hemostasis Achieved: Pressure End Time: 11:41 Procedural Pain: 0 Post Procedural Pain: 0 Response to Treatment: Procedure was tolerated well Level of Consciousness (Post- Awake and Alert procedure): Post Debridement Measurements of Total Wound Length: (cm) 0.4 Width: (cm) 0.3 Depth: (cm) 0.5 Volume: (cm) 0.047 Character of Wound/Ulcer Post Debridement: Improved Severity of Tissue Post Debridement: Fat layer exposed Post Procedure Diagnosis Same as Pre-procedure Electronic Signature(s) Signed: 07/20/2020 4:28:01 PM By: Linton Ham MD Signed: 07/23/2020 3:14:58 PM By: Rhae Hammock RN Entered By: Linton Ham on 07/20/2020 11:44:10 -------------------------------------------------------------------------------- HPI Details Patient Name: Date of Service: Peter James Peter James N 07/20/2020 10:30 A M Medical Record Number: [id number] Patient  Account Number: [id number] Date of Birth/Sex: Treating RN: 24-Oct-1925 (87 y.o. [name] Primary Care Provider: Jerene James Other Clinician: Referring Provider: Treating Provider/Extender: Greta Doom Weeks in Treatment: 10 History of Present Illness HPI Description: ADMISSION 05/10/2020 This is a independent 87 year old man who has had an area on his right medial first metatarsal head according to him for roughly 3 months. This started as a skin tear that scab he removed the scab and inside an area on this since. He is a type II diabetic also had an area on his left second toe and right first metatarsal head in November so this wound may have been present longer than not. He has undergone extensive arterial review including a stent in the right popliteal laser atherectomy of the right popliteal and anterior tibial arteries on 01/19/2020. On 02/23/2020 he had a laser atherectomy of the left peroneal angioplasty of the left peroneal and amputation of the left second toe. Follow-up noninvasive studies on 03/05/2020 showed an ABI on the right of 0.75 with a TBI of 0.39. On the left his ABI was 0.77, his left great toe was amputated. He is not experiencing any pain. ALSO he had a right knee skin tear within the last week or 2. He is not really dressing either 1 of these areas Past medical; pacemaker, type 2 diabetes with PAD, osteomyelitis of the left first toe amputated on 09/29/2019, hypertension hypercholesterolemia, COPD 2/28; this is a 87 year old man who arrives I believe with his son. He is a type II diabetic. He has a wound on the medial part of the first metatarsal head. Culture I did last time showed acid Enterobacter as well as rare Enterococcus. Will be giving him Augmentin to cover both of these for 10 days. His x-ray did not show osteomyelitis although they did comment on possible cortical erosion along the medial aspect of the head of the first metatarsal. He will  definitely need an MRI. He comes into the clinic today  with a new wound on the left third toe dorsal aspect over the PIP this also has exposed bone 05/25/2020 upon evaluation today patient appears to be doing well with regard to his wounds currently. Both are fairly clean. He is having a CT scan of his right foot on Friday. With that being said the toe on the left foot appears to be doing okay this is not too deep. There does not appear to be any signs of active infection at this time. No fevers, chills, nausea, vomiting, or diarrhea. 3/17; this is a patient with a wound on the medial aspect of his right first MTP. This is in the setting of severe PAD with type 2 diabetes. He has previously been revascularized on the side on 02/23/2020 however his post procedure noninvasive studies showed an ABI of 0.75 and a TBI of 0.39. He was supposed to have follow-up noninvasive studies today but because he was here and we were late he missed them but were going to try to book him sometime this week. Patient could not have an MRI of the right foot because of a pacemaker. He did have a CT scan that showed marginal erosions along the medial aspect of the first metatarsal head which may reflect osteomyelitis. The radiologist also queries underlying arthropathy such as gout. He came in 2 weeks ago with a new wound on his left third toe. This was x-rayed on 3/16 that did not show obvious osteomyelitis by plain 3/21 semi the patient had his arterial studies on 3/18. These showed an ABI on the right of 0.69 with a TBI of 0.35 and monophasic waveforms at the dorsalis pedis, biphasic at the posterior tibial. On the left he had an ABI of 0.85, great toe amputation monophasic waveforms diffusely. He has an area on the medial part of his right first metatarsal head which probes to bone suspicion of underlying osteomyelitis. He has a wound on the tip of his left third toe which also has exposed bone. He is been reviewed by Dr.  Donzetta Matters on 06/04/2020. He is going to have a right lower extremity angiogram using CO2 1 week from today. 4/1; the patient went to the Cath Lab by Dr. Donzetta Matters of vein and vascular. He underwent laser atherectomy of the right anterior tibial artery and a balloon angioplasty of the right anterior tibial artery. He has wounds on the medial right first metatarsal head and his left third toe. The left third toe was not looking at all healthy. Exposed bone in fact I think part of the joint was protruding today. This is not going to be salvageable. 4/8; patient has a follow-up with Dr. Donzetta Matters I am going to ask him to look at the third toe. This is not going to be salvageable and needs to be amputated. He has a palpable dorsalis pedis pulse on that side I am hopeful he has enough blood flow to heal this. 5/3 the patient had his left third toe amputated. Since then he has been followed with Dr. Donzetta Matters and sees him this afternoon. The area on the right first met head much better than when I last saw this. He has been revascularized on this side Electronic Signature(s) Signed: 07/20/2020 4:28:01 PM By: Linton Ham MD Entered By: Linton Ham on 07/20/2020 11:48:14 -------------------------------------------------------------------------------- Physical Exam Details Patient Name: Date of Service: Peter James [name] 07/20/2020 10:30 A M Medical Record Number: [id number] Patient Account Number: [id number] Date of Birth/Sex: Treating RN: 1926-02-13 (87 y.o. M)  Rhae Hammock Primary Care Provider: Jerene James Other Clinician: Referring Provider: Treating Provider/Extender: Greta Doom Weeks in Treatment: 10 Notes Wound exam; the area on the right medial first MTP actually looks considerably better. It does not probe to bone I remove thick skin and callus from the circumference there was no bleeding. I am hopeful to allow an edge for final epithelialization. Electronic Signature(s) Signed:  07/20/2020 4:28:01 PM By: Linton Ham MD Entered By: Linton Ham on 07/20/2020 11:48:51 -------------------------------------------------------------------------------- Physician Orders Details Patient Name: Date of Service: Peter James [name] 07/20/2020 10:30 A M Medical Record Number: [id number] Patient Account Number: [id number] Date of Birth/Sex: Treating RN: 30-Dec-1925 (87 y.o. [name] Primary Care Provider: Jerene James Other Clinician: Referring Provider: Treating Provider/Extender: Greta Doom Weeks in Treatment: 10 Verbal / Phone Orders: No Diagnosis Coding Follow-up Appointments Return A ppointment in 2 weeks. Other: - Pt. has appt. f/u with Dr. Donzetta Matters today for left 3rd toe amputation f/t Bathing/ Shower/ Hygiene May shower with protection but do not get wound dressing(s) wet. Off-Loading Open toe surgical shoe to: - right foot Home Health New wound care orders this week; continue Home Health for wound care. May utilize formulary equivalent dressing for wound treatment orders unless otherwise specified. - [place] orders for right med. foot only. Left foot wound orders should come from Dr. Donzetta Matters. Dressing changes to be completed by [place] on Monday / Wednesday / Friday except when patient has scheduled visit at [facility]. Other Home Health Orders/Instructions: - [facility] Wound Treatment Wound #1 - Foot Wound Laterality: Right, Medial Cleanser: Wound Cleanser (Home Health) 3 x Per Week/7 Days Discharge Instructions: Cleanse the wound with wound cleanser or normal saline prior to applying a clean dressing using gauze sponges, not tissue or cotton balls. Prim Dressing: Endoform 2x2 in ([place]) 3 x Per Week/7 Days ary Discharge Instructions: Moisten with saline Secondary Dressing: Woven Gauze Sponges 2x2 in (Home Health) 3 x Per Week/7 Days Discharge Instructions: Apply saline moistened gauze over Endoform to hold in  place Secondary Dressing: Optifoam Non-Adhesive Dressing, 4x4 in (Home Health) 3 x Per Week/7 Days Discharge Instructions: Foam donut Secured With: Kerlix Roll Sterile, 4.5x3.1 (in/yd) ([place]) 3 x Per Week/7 Days Discharge Instructions: Secure with Kerlix as directed. Secured With: Paper Tape, 2x10 (in/yd) (Home Health) 3 x Per Week/7 Days Discharge Instructions: Secure dressing with tape as directed. Electronic Signature(s) Signed: 07/20/2020 4:28:01 PM By: Linton Ham MD Signed: 07/23/2020 3:14:58 PM By: Rhae Hammock RN Entered By: Rhae Hammock on 07/20/2020 11:44:45 -------------------------------------------------------------------------------- Problem List Details Patient Name: Date of Service: Peter James [name] 07/20/2020 10:30 A M Medical Record Number: [id number] Patient Account Number: [id number] Date of Birth/Sex: Treating RN: 04/02/1925 (87 y.o. Peter James, [name] Primary Care Provider: Jerene James Other Clinician: Referring Provider: Treating Provider/Extender: Greta Doom Weeks in Treatment: 10 Active Problems ICD-10 Encounter Code Description Active Date MDM Diagnosis E11.621 Type 2 diabetes mellitus with foot ulcer 05/10/2020 No Yes E11.51 Type 2 diabetes mellitus with diabetic peripheral angiopathy without gangrene 05/10/2020 No Yes L97.514 Non-pressure chronic ulcer of other part of right foot with necrosis of bone 05/10/2020 No Yes L97.518 Non-pressure chronic ulcer of other part of right foot with other specified 05/17/2020 No Yes severity Inactive Problems ICD-10 Code Description Active Date Inactive Date L97.524 Non-pressure chronic ulcer of other part of left foot with necrosis of bone 06/07/2020 06/07/2020 Resolved Problems ICD-10 Code Description Active Date Resolved Date  B34.193X Abrasion, right knee, subsequent encounter 05/10/2020 05/10/2020 Electronic Signature(s) Signed: 07/20/2020 4:28:01 PM By: Linton Ham  MD Entered By: Linton Ham on 07/20/2020 11:43:50 -------------------------------------------------------------------------------- Progress Note Details Patient Name: Date of Service: Peter James [name] 07/20/2020 10:30 A M Medical Record Number: [id number] Patient Account Number: [id number] Date of Birth/Sex: Treating RN: [birth year] (87 y.o. [name] Primary Care Provider: Jerene James Other Clinician: Referring Provider: Treating Provider/Extender: Greta Doom Weeks in Treatment: 10 Subjective History of Present Illness (HPI) ADMISSION 05/10/2020 This is a independent 87 year old man who has had an area on his right medial first metatarsal head according to him for roughly 3 months. This started as a skin tear that scab he removed the scab and inside an area on this since. He is a type II diabetic also had an area on his left second toe and right first metatarsal head in November so this wound may have been present longer than not. He has undergone extensive arterial review including a stent in the right popliteal laser atherectomy of the right popliteal and anterior tibial arteries on 01/19/2020. On 02/23/2020 he had a laser atherectomy of the left peroneal angioplasty of the left peroneal and amputation of the left second toe. Follow-up noninvasive studies on 03/05/2020 showed an ABI on the right of 0.75 with a TBI of 0.39. On the left his ABI was 0.77, his left great toe was amputated. He is not experiencing any pain. ALSO he had a right knee skin tear within the last week or 2. He is not really dressing either 1 of these areas Past medical; pacemaker, type 2 diabetes with PAD, osteomyelitis of the left first toe amputated on 09/29/2019, hypertension hypercholesterolemia, COPD 2/28; this is a 87 year old man who arrives I believe with his son. He is a type II diabetic. He has a wound on the medial part of the first metatarsal head. Culture I did last time showed  acid Enterobacter as well as rare Enterococcus. Will be giving him Augmentin to cover both of these for 10 days. His x-ray did not show osteomyelitis although they did comment on possible cortical erosion along the medial aspect of the head of the first metatarsal. He will definitely need an MRI. He comes into the clinic today with a new wound on the left third toe dorsal aspect over the PIP this also has exposed bone 05/25/2020 upon evaluation today patient appears to be doing well with regard to his wounds currently. Both are fairly clean. He is having a CT scan of his right foot on Friday. With that being said the toe on the left foot appears to be doing okay this is not too deep. There does not appear to be any signs of active infection at this time. No fevers, chills, nausea, vomiting, or diarrhea. 3/17; this is a patient with a wound on the medial aspect of his right first MTP. This is in the setting of severe PAD with type 2 diabetes. He has previously been revascularized on the side on 02/23/2020 however his post procedure noninvasive studies showed an ABI of 0.75 and a TBI of 0.39. He was supposed to have follow-up noninvasive studies today but because he was here and we were late he missed them but were going to try to book him sometime this week. Patient could not have an MRI of the right foot because of a pacemaker. He did have a CT scan that showed marginal erosions along the medial aspect of the  first metatarsal head which may reflect osteomyelitis. The radiologist also queries underlying arthropathy such as gout. He came in 2 weeks ago with a new wound on his left third toe. This was x-rayed on 3/16 that did not show obvious osteomyelitis by plain 3/21 semi the patient had his arterial studies on 3/18. These showed an ABI on the right of 0.69 with a TBI of 0.35 and monophasic waveforms at the dorsalis pedis, biphasic at the posterior tibial. On the left he had an ABI of 0.85, great toe  amputation monophasic waveforms diffusely. He has an area on the medial part of his right first metatarsal head which probes to bone suspicion of underlying osteomyelitis. He has a wound on the tip of his left third toe which also has exposed bone. He is been reviewed by Dr. Donzetta Matters on 06/04/2020. He is going to have a right lower extremity angiogram using CO2 1 week from today. 4/1; the patient went to the Cath Lab by Dr. Donzetta Matters of vein and vascular. He underwent laser atherectomy of the right anterior tibial artery and a balloon angioplasty of the right anterior tibial artery. He has wounds on the medial right first metatarsal head and his left third toe. The left third toe was not looking at all healthy. Exposed bone in fact I think part of the joint was protruding today. This is not going to be salvageable. 4/8; patient has a follow-up with Dr. Donzetta Matters I am going to ask him to look at the third toe. This is not going to be salvageable and needs to be amputated. He has a palpable dorsalis pedis pulse on that side I am hopeful he has enough blood flow to heal this. 5/3 the patient had his left third toe amputated. Since then he has been followed with Dr. Donzetta Matters and sees him this afternoon. The area on the right first met head much better than when I last saw this. He has been revascularized on this side Patient History Information obtained from Patient. Family History Heart Disease - Mother,Father,Siblings, No family history of Cancer, Diabetes, Hereditary Spherocytosis, Hypertension, Kidney Disease, Lung Disease, Seizures, Stroke, Thyroid Problems, Tuberculosis. Social History Former smoker, Marital Status - Widowed, Alcohol Use - Never, Drug Use - No History, Caffeine Use - Rarely - coffee. Medical History Eyes Denies history of Cataracts, Glaucoma, Optic Neuritis Ear/Nose/Mouth/Throat Denies history of Chronic sinus problems/congestion, Middle ear problems Hematologic/Lymphatic Denies history of  Anemia, Hemophilia, Human Immunodeficiency Virus, Lymphedema, Sickle Cell Disease Respiratory Denies history of Aspiration, Asthma, Chronic Obstructive Pulmonary Disease (COPD), Pneumothorax, Sleep Apnea, Tuberculosis Cardiovascular Patient has history of Coronary Artery Disease, Hypertension, Peripheral Arterial Disease Denies history of Angina, Arrhythmia, Congestive Heart Failure, Deep Vein Thrombosis, Hypotension, Myocardial Infarction, Peripheral Venous Disease, Phlebitis, Vasculitis Gastrointestinal Denies history of Cirrhosis , Colitis, Crohnoos, Hepatitis A, Hepatitis B, Hepatitis C Endocrine Patient has history of Type II Diabetes Denies history of Type I Diabetes Genitourinary Denies history of End Stage Renal Disease Immunological Denies history of Lupus Erythematosus, Raynaudoos, Scleroderma Integumentary (Skin) Denies history of History of Burn Musculoskeletal Denies history of Gout, Rheumatoid Arthritis, Osteoarthritis, Osteomyelitis Neurologic Patient has history of Neuropathy Denies history of Dementia, Quadriplegia, Paraplegia, Seizure Disorder Oncologic Denies history of Received Chemotherapy, Received Radiation Psychiatric Denies history of Anorexia/bulimia, Confinement Anxiety Hospitalization/Surgery History - left great toe amputation. - left 2nd toe amputation. - cataract surgery. - hermorrhoid surgery. - hernia repair. - peripheral vascular atherectomy. - peripheral vascular balloon angioplasty. - peripheral vasculAR intervention. - left 3rd toe  amputation. Medical A Surgical History Notes nd Respiratory exertional dyspnea Cardiovascular hx of bradycardia, hyperlipidemia Neurologic hx of vertigo Objective Constitutional Vitals Time Taken: 11:06 AM, Height: 73 in, Source: Stated, Weight: 217 lbs, Source: Stated, BMI: 28.6, Temperature: 98.1 F, Pulse: 80 bpm, Respiratory Rate: 18 breaths/min, Blood Pressure: 127/69 mmHg, Capillary Blood Glucose: 119  mg/dl. General Notes: glucose per pt report this am Integumentary (Hair, Skin) Wound #1 status is Open. Original cause of wound was Blister. The date acquired was: 02/18/2020. The wound has been in treatment 10 weeks. The wound is located on the Right,Medial Foot. The wound measures 0.4cm length x 0.3cm width x 0.5cm depth; 0.094cm^2 area and 0.047cm^3 volume. There is Fat Layer (Subcutaneous Tissue) exposed. There is no tunneling noted, however, there is undermining starting at 12:00 and ending at 12:00 with a maximum distance of 0.7cm. There is a medium amount of serosanguineous drainage noted. The wound margin is well defined and not attached to the wound base. There is large (67- 100%) pink granulation within the wound bed. There is a small (1-33%) amount of necrotic tissue within the wound bed including Adherent Slough. Assessment Active Problems ICD-10 Type 2 diabetes mellitus with foot ulcer Type 2 diabetes mellitus with diabetic peripheral angiopathy without gangrene Non-pressure chronic ulcer of other part of right foot with necrosis of bone Non-pressure chronic ulcer of other part of right foot with other specified severity Procedures Wound #1 Pre-procedure diagnosis of Wound #1 is a Diabetic Wound/Ulcer of the Lower Extremity located on the Right,Medial Foot .Severity of Tissue Pre Debridement is: Fat layer exposed. There was a Selective/Open Wound Skin/Epidermis Debridement with a total area of 0.12 sq cm performed by Ricard Dillon., MD. With the following instrument(s): Curette to remove Viable and Non-Viable tissue/material. Material removed includes Skin: Dermis and Skin: Epidermis and after achieving pain control using Lidocaine. No specimens were taken. A time out was conducted at 11:40, prior to the start of the procedure. A Minimum amount of bleeding was controlled with Pressure. The procedure was tolerated well with a pain level of 0 throughout and a pain level of 0  following the procedure. Post Debridement Measurements: 0.4cm length x 0.3cm width x 0.5cm depth; 0.047cm^3 volume. Character of Wound/Ulcer Post Debridement is improved. Severity of Tissue Post Debridement is: Fat layer exposed. Post procedure Diagnosis Wound #1: Same as Pre-Procedure Plan Follow-up Appointments: Return Appointment in 2 weeks. Other: - Pt. has appt. f/u with Dr. Donzetta Matters today for left 3rd toe amputation f/t Bathing/ Shower/ Hygiene: May shower with protection but do not get wound dressing(s) wet. Off-Loading: Open toe surgical shoe to: - right foot Home Health: New wound care orders this week; continue Home Health for wound care. May utilize formulary equivalent dressing for wound treatment orders unless otherwise specified. - [place] orders for right med. foot only. Left foot wound orders should come from Dr. Donzetta Matters. Dressing changes to be completed by [place] on Monday / Wednesday / Friday except when patient has scheduled visit at [facility] Other Home Health Orders/Instructions: - [facility] WOUND #1: - Foot Wound Laterality: Right, Medial Cleanser: Wound Cleanser (Home Health) 3 x Per Week/7 Days Discharge Instructions: Cleanse the wound with wound cleanser or normal saline prior to applying a clean dressing using gauze sponges, not tissue or cotton balls. Prim Dressing: Endoform 2x2 in ([place]) 3 x Per Week/7 Days ary Discharge Instructions: Moisten with saline Secondary Dressing: Woven Gauze Sponges 2x2 in (Home Health) 3 x Per Week/7 Days  Discharge Instructions: Apply saline moistened gauze over Endoform to hold in place Secondary Dressing: Optifoam Non-Adhesive Dressing, 4x4 in (Home Health) 3 x Per Week/7 Days Discharge Instructions: Foam donut Secured With: Kerlix Roll Sterile, 4.5x3.1 (in/yd) ([place]) 3 x Per Week/7 Days Discharge Instructions: Secure with Kerlix as directed. Secured With: Paper T ape, 2x10 (in/yd) (Home Health) 3 x Per Week/7  Days Discharge Instructions: Secure dressing with tape as directed. #1 I am continuing with the endoform on the right medial first toe. This is filled in quite a bit and the tissue looks healthy 2. I did not look at the left third toe amputation site he sees Dr. Donzetta Matters this afternoon. He has the wound been dressed by home health Electronic Signature(s) Signed: 07/20/2020 4:28:01 PM By: Linton Ham MD Entered By: Linton Ham on 07/20/2020 11:49:50 -------------------------------------------------------------------------------- HxROS Details Patient Name: Date of Service: Peter James [name] 07/20/2020 10:30 A M Medical Record Number: [id number] Patient Account Number: [id number] Date of Birth/Sex: Treating RN: 1925/04/23 (87 y.o. [name] Primary Care Provider: Jerene James Other Clinician: Referring Provider: Treating Provider/Extender: Greta Doom Weeks in Treatment: 10 Information Obtained From Patient Eyes Medical History: Negative for: Cataracts; Glaucoma; Optic Neuritis Ear/Nose/Mouth/Throat Medical History: Negative for: Chronic sinus problems/congestion; Middle ear problems Hematologic/Lymphatic Medical History: Negative for: Anemia; Hemophilia; Human Immunodeficiency Virus; Lymphedema; Sickle Cell Disease Respiratory Medical History: Negative for: Aspiration; Asthma; Chronic Obstructive Pulmonary Disease (COPD); Pneumothorax; Sleep Apnea; Tuberculosis Past Medical History Notes: exertional dyspnea Cardiovascular Medical History: Positive for: Coronary Artery Disease; Hypertension; Peripheral Arterial Disease Negative for: Angina; Arrhythmia; Congestive Heart Failure; Deep Vein Thrombosis; Hypotension; Myocardial Infarction; Peripheral Venous Disease; Phlebitis; Vasculitis Past Medical History Notes: hx of bradycardia, hyperlipidemia Gastrointestinal Medical History: Negative for: Cirrhosis ; Colitis; Crohns; Hepatitis A; Hepatitis B;  Hepatitis C Endocrine Medical History: Positive for: Type II Diabetes Negative for: Type I Diabetes Time with diabetes: 5 year Treated with: Oral agents Blood sugar tested every day: Yes Tested : Genitourinary Medical History: Negative for: End Stage Renal Disease Immunological Medical History: Negative for: Lupus Erythematosus; Raynauds; Scleroderma Integumentary (Skin) Medical History: Negative for: History of Burn Musculoskeletal Medical History: Negative for: Gout; Rheumatoid Arthritis; Osteoarthritis; Osteomyelitis Neurologic Medical History: Positive for: Neuropathy Negative for: Dementia; Quadriplegia; Paraplegia; Seizure Disorder Past Medical History Notes: hx of vertigo Oncologic Medical History: Negative for: Received Chemotherapy; Received Radiation Psychiatric Medical History: Negative for: Anorexia/bulimia; Confinement Anxiety Immunizations Pneumococcal Vaccine: Received Pneumococcal Vaccination: Yes Immunization Notes: pt. doesn't remember last tetanus shot Implantable Devices None Hospitalization / Surgery History Type of Hospitalization/Surgery left great toe amputation left 2nd toe amputation cataract surgery hermorrhoid surgery hernia repair peripheral vascular atherectomy peripheral vascular balloon angioplasty peripheral vasculAR intervention left 3rd toe amputation Family and Social History Cancer: No; Diabetes: No; Heart Disease: Yes - Mother,Father,Siblings; Hereditary Spherocytosis: No; Hypertension: No; Kidney Disease: No; Lung Disease: No; Seizures: No; Stroke: No; Thyroid Problems: No; Tuberculosis: No; Former smoker; Marital Status - Widowed; Alcohol Use: Never; Drug Use: No History; Caffeine Use: Rarely - coffee; Financial Concerns: No; Food, Clothing or Shelter Needs: No; Support System Lacking: No; Transportation Concerns: No Electronic Signature(s) Signed: 07/20/2020 4:28:01 PM By: Linton Ham MD Signed: 07/20/2020 5:08:22 PM  By: Baruch Gouty RN, BSN Entered By: Baruch Gouty on 07/20/2020 11:06:19 -------------------------------------------------------------------------------- [place] Details Patient Name: Date of Service: Peter James [name] 07/20/2020 Medical Record Number: [id number] Patient Account Number: [id number] Date of Birth/Sex: Treating RN: [birth year] (87 y.o. [name] Primary Care Provider: Woody Seller,  [name] Other Clinician: Referring Provider: Treating Provider/Extender: Ernestina Patches, [name] Weeks in Treatment: 10 Diagnosis Coding ICD-10 Codes Code Description E11.621 Type 2 diabetes mellitus with foot ulcer E11.51 Type 2 diabetes mellitus with diabetic peripheral angiopathy without gangrene L97.514 Non-pressure chronic ulcer of other part of right foot with necrosis of bone L97.518 Non-pressure chronic ulcer of other part of right foot with other specified severity Facility Procedures CPT4 Code: [id number] Description: [phone] - [facility]OUND 1ST 20 SQ CM OR < ICD-10 Diagnosis Description L97.518 Non-pressure chronic ulcer of other part of right foot with other specified sever Modifier: ity Quantity: 1 Physician Procedures : CPT4 Code Description Modifier [id number] - WC PHYS DEBR WO ANESTH 20 SQ CM ICD-10 Diagnosis Description L97.518 Non-pressure chronic ulcer of other part of right foot with other specified severity Quantity: 1 Electronic Signature(s) Signed: 07/20/2020 4:28:01 PM By: Linton Ham MD Entered By: Linton Ham on 07/20/2020 11:50:04

## 2020-07-26 DIAGNOSIS — I110 Hypertensive heart disease with heart failure: Secondary | ICD-10-CM | POA: Diagnosis not present

## 2020-07-26 DIAGNOSIS — E1151 Type 2 diabetes mellitus with diabetic peripheral angiopathy without gangrene: Secondary | ICD-10-CM | POA: Diagnosis not present

## 2020-07-26 DIAGNOSIS — I5030 Unspecified diastolic (congestive) heart failure: Secondary | ICD-10-CM | POA: Diagnosis not present

## 2020-07-26 DIAGNOSIS — L97514 Non-pressure chronic ulcer of other part of right foot with necrosis of bone: Secondary | ICD-10-CM | POA: Diagnosis not present

## 2020-07-26 DIAGNOSIS — Z4781 Encounter for orthopedic aftercare following surgical amputation: Secondary | ICD-10-CM | POA: Diagnosis not present

## 2020-07-26 DIAGNOSIS — I2510 Atherosclerotic heart disease of native coronary artery without angina pectoris: Secondary | ICD-10-CM | POA: Diagnosis not present

## 2020-07-26 DIAGNOSIS — E11621 Type 2 diabetes mellitus with foot ulcer: Secondary | ICD-10-CM | POA: Diagnosis not present

## 2020-07-26 DIAGNOSIS — Z89422 Acquired absence of other left toe(s): Secondary | ICD-10-CM | POA: Diagnosis not present

## 2020-07-26 DIAGNOSIS — E1140 Type 2 diabetes mellitus with diabetic neuropathy, unspecified: Secondary | ICD-10-CM | POA: Diagnosis not present

## 2020-07-28 DIAGNOSIS — I5030 Unspecified diastolic (congestive) heart failure: Secondary | ICD-10-CM | POA: Diagnosis not present

## 2020-07-28 DIAGNOSIS — L97514 Non-pressure chronic ulcer of other part of right foot with necrosis of bone: Secondary | ICD-10-CM | POA: Diagnosis not present

## 2020-07-28 DIAGNOSIS — E1151 Type 2 diabetes mellitus with diabetic peripheral angiopathy without gangrene: Secondary | ICD-10-CM | POA: Diagnosis not present

## 2020-07-28 DIAGNOSIS — Z4781 Encounter for orthopedic aftercare following surgical amputation: Secondary | ICD-10-CM | POA: Diagnosis not present

## 2020-07-28 DIAGNOSIS — Z89422 Acquired absence of other left toe(s): Secondary | ICD-10-CM | POA: Diagnosis not present

## 2020-07-28 DIAGNOSIS — E11621 Type 2 diabetes mellitus with foot ulcer: Secondary | ICD-10-CM | POA: Diagnosis not present

## 2020-07-28 DIAGNOSIS — I2510 Atherosclerotic heart disease of native coronary artery without angina pectoris: Secondary | ICD-10-CM | POA: Diagnosis not present

## 2020-07-28 DIAGNOSIS — I110 Hypertensive heart disease with heart failure: Secondary | ICD-10-CM | POA: Diagnosis not present

## 2020-07-28 DIAGNOSIS — E1140 Type 2 diabetes mellitus with diabetic neuropathy, unspecified: Secondary | ICD-10-CM | POA: Diagnosis not present

## 2020-07-30 DIAGNOSIS — Z89422 Acquired absence of other left toe(s): Secondary | ICD-10-CM | POA: Diagnosis not present

## 2020-07-30 DIAGNOSIS — E1140 Type 2 diabetes mellitus with diabetic neuropathy, unspecified: Secondary | ICD-10-CM | POA: Diagnosis not present

## 2020-07-30 DIAGNOSIS — I5030 Unspecified diastolic (congestive) heart failure: Secondary | ICD-10-CM | POA: Diagnosis not present

## 2020-07-30 DIAGNOSIS — I2510 Atherosclerotic heart disease of native coronary artery without angina pectoris: Secondary | ICD-10-CM | POA: Diagnosis not present

## 2020-07-30 DIAGNOSIS — E11621 Type 2 diabetes mellitus with foot ulcer: Secondary | ICD-10-CM | POA: Diagnosis not present

## 2020-07-30 DIAGNOSIS — Z4781 Encounter for orthopedic aftercare following surgical amputation: Secondary | ICD-10-CM | POA: Diagnosis not present

## 2020-07-30 DIAGNOSIS — L97514 Non-pressure chronic ulcer of other part of right foot with necrosis of bone: Secondary | ICD-10-CM | POA: Diagnosis not present

## 2020-07-30 DIAGNOSIS — I110 Hypertensive heart disease with heart failure: Secondary | ICD-10-CM | POA: Diagnosis not present

## 2020-07-30 DIAGNOSIS — E1151 Type 2 diabetes mellitus with diabetic peripheral angiopathy without gangrene: Secondary | ICD-10-CM | POA: Diagnosis not present

## 2020-08-02 DIAGNOSIS — I5030 Unspecified diastolic (congestive) heart failure: Secondary | ICD-10-CM | POA: Diagnosis not present

## 2020-08-02 DIAGNOSIS — I2510 Atherosclerotic heart disease of native coronary artery without angina pectoris: Secondary | ICD-10-CM | POA: Diagnosis not present

## 2020-08-02 DIAGNOSIS — E11621 Type 2 diabetes mellitus with foot ulcer: Secondary | ICD-10-CM | POA: Diagnosis not present

## 2020-08-02 DIAGNOSIS — Z4781 Encounter for orthopedic aftercare following surgical amputation: Secondary | ICD-10-CM | POA: Diagnosis not present

## 2020-08-02 DIAGNOSIS — E1140 Type 2 diabetes mellitus with diabetic neuropathy, unspecified: Secondary | ICD-10-CM | POA: Diagnosis not present

## 2020-08-02 DIAGNOSIS — E1151 Type 2 diabetes mellitus with diabetic peripheral angiopathy without gangrene: Secondary | ICD-10-CM | POA: Diagnosis not present

## 2020-08-02 DIAGNOSIS — L97514 Non-pressure chronic ulcer of other part of right foot with necrosis of bone: Secondary | ICD-10-CM | POA: Diagnosis not present

## 2020-08-02 DIAGNOSIS — Z89422 Acquired absence of other left toe(s): Secondary | ICD-10-CM | POA: Diagnosis not present

## 2020-08-02 DIAGNOSIS — I110 Hypertensive heart disease with heart failure: Secondary | ICD-10-CM | POA: Diagnosis not present

## 2020-08-03 ENCOUNTER — Encounter (HOSPITAL_BASED_OUTPATIENT_CLINIC_OR_DEPARTMENT_OTHER): Payer: Medicare HMO | Admitting: Internal Medicine

## 2020-08-03 ENCOUNTER — Other Ambulatory Visit: Payer: Self-pay

## 2020-08-03 DIAGNOSIS — E1151 Type 2 diabetes mellitus with diabetic peripheral angiopathy without gangrene: Secondary | ICD-10-CM | POA: Diagnosis not present

## 2020-08-03 DIAGNOSIS — E1140 Type 2 diabetes mellitus with diabetic neuropathy, unspecified: Secondary | ICD-10-CM | POA: Diagnosis not present

## 2020-08-03 DIAGNOSIS — I10 Essential (primary) hypertension: Secondary | ICD-10-CM | POA: Diagnosis not present

## 2020-08-03 DIAGNOSIS — Z89422 Acquired absence of other left toe(s): Secondary | ICD-10-CM | POA: Diagnosis not present

## 2020-08-03 DIAGNOSIS — L97512 Non-pressure chronic ulcer of other part of right foot with fat layer exposed: Secondary | ICD-10-CM | POA: Diagnosis not present

## 2020-08-03 DIAGNOSIS — E11621 Type 2 diabetes mellitus with foot ulcer: Secondary | ICD-10-CM | POA: Diagnosis not present

## 2020-08-03 DIAGNOSIS — L97514 Non-pressure chronic ulcer of other part of right foot with necrosis of bone: Secondary | ICD-10-CM | POA: Diagnosis not present

## 2020-08-03 NOTE — Progress Notes (Signed)
Peter James, Peter James ([id number]) Visit Report for 08/03/2020 HPI Details Patient Name: Date of Service: HA IRSTO N, NELSO N 08/03/2020 10:00 A M Medical Record Number: [id number] Patient Account Number: [id number] Date of Birth/Sex: Treating RN: 11/26/1925 (87 y.o. M) [name], [name] Primary Care Provider: [name], [name] Other Clinician: Referring Provider: Treating Provider/Extender: [name], [name] [name], [name] Weeks in Treatment: 12 History of Present Illness HPI Description: ADMISSION 05/10/2020 This is a independent 89-year-old man who has had an area on his right medial first metatarsal head according to him for roughly 3 months. This started as a skin tear that scab he removed the scab and inside an area on this since. He is a type II diabetic also had an area on his left second toe and right first metatarsal head in November so this wound may have been present longer than not. He has undergone extensive arterial review including a stent in the right popliteal laser atherectomy of the right popliteal and anterior tibial arteries on 01/19/2020. On 02/23/2020 he had a laser atherectomy of the left peroneal angioplasty of the left peroneal and amputation of the left second toe. Follow-up noninvasive studies on 03/05/2020 showed an ABI on the right of 0.75 with a TBI of 0.39. On the left his ABI was 0.77, his left great toe was amputated. He is not experiencing any pain. ALSO he had a right knee skin tear within the last week or 2. He is not really dressing either 1 of these areas Past medical; pacemaker, type 2 diabetes with PAD, osteomyelitis of the left first toe amputated on 09/29/2019, hypertension hypercholesterolemia, COPD 2/28; this is a 89-year-old man who arrives I believe with his son. He is a type II diabetic. He has a wound on the medial part of the first metatarsal head. Culture I did last time showed acid Enterobacter as well as rare Enterococcus. Will be giving him Augmentin to cover  both of these for 10 days. His x-ray did not show osteomyelitis although they did comment on possible cortical erosion along the medial aspect of the head of the first metatarsal. He will definitely need an MRI. He comes into the clinic today with a new wound on the left third toe dorsal aspect over the PIP this also has exposed bone 05/25/2020 upon evaluation today patient appears to be doing well with regard to his wounds currently. Both are fairly clean. He is having a CT scan of his right foot on Friday. With that being said the toe on the left foot appears to be doing okay this is not too deep. There does not appear to be any signs of active infection at this time. No fevers, chills, nausea, vomiting, or diarrhea. 3/17; this is a patient with a wound on the medial aspect of his right first MTP. This is in the setting of severe PAD with type 2 diabetes. He has previously been revascularized on the side on 02/23/2020 however his post procedure noninvasive studies showed an ABI of 0.75 and a TBI of 0.39. He was supposed to have follow-up noninvasive studies today but because he was here and we were late he missed them but were going to try to book him sometime this week. Patient could not have an MRI of the right foot because of a pacemaker. He did have a CT scan that showed marginal erosions along the medial aspect of the first metatarsal head which may reflect osteomyelitis. The radiologist also queries underlying arthropathy such as gout. He came in 2 weeks   ago with a new wound on his left third toe. This was x-rayed on 3/16 that did not show obvious osteomyelitis by plain 3/21 semi the patient had his arterial studies on 3/18. These showed an ABI on the right of 0.69 with a TBI of 0.35 and monophasic waveforms at the dorsalis pedis, biphasic at the posterior tibial. On the left he had an ABI of 0.85, great toe amputation monophasic waveforms diffusely. He has an area on the medial part of his right  first metatarsal head which probes to bone suspicion of underlying osteomyelitis. He has a wound on the tip of his left third toe which also has exposed bone. He is been reviewed by Dr. Cain on 06/04/2020. He is going to have a right lower extremity angiogram using CO2 1 week from today. 4/1; the patient went to the Cath Lab by Dr. Cain of vein and vascular. He underwent laser atherectomy of the right anterior tibial artery and a balloon angioplasty of the right anterior tibial artery. He has wounds on the medial right first metatarsal head and his left third toe. The left third toe was not looking at all healthy. Exposed bone in fact I think part of the joint was protruding today. This is not going to be salvageable. 4/8; patient has a follow-up with Dr. Cain I am going to ask him to look at the third toe. This is not going to be salvageable and needs to be amputated. He has a palpable dorsalis pedis pulse on that side I am hopeful he has enough blood flow to heal this. 5/3 the patient had his left third toe amputated. Since then he has been followed with Dr. Cain and sees him this afternoon. The area on the right first met head much better than when I last saw this. He has been revascularized on this side 5/17; he says the left third toe amputation site is healed I did not look at this today. The area on his right medial first metatarsal head is a lot better small wound apparently 2 mm in depth with 2 mm undermining. We have been using endoform Electronic Signature(s) Signed: 08/03/2020 4:16:43 PM By: [name], [name] MD Entered By: [name], [name] on 08/03/2020 11:08:30 -------------------------------------------------------------------------------- Physical Exam Details Patient Name: Date of Service: HA IRSTO N, NELSO N 08/03/2020 10:00 A M Medical Record Number: [id number] Patient Account Number: [id number] Date of Birth/Sex: Treating RN: 02/20/1926 (87 y.o. M) [name], [name] Primary Care  Provider: [name], [name] Other Clinician: Referring Provider: Treating Provider/Extender: [name], [name] [name], [name] Weeks in Treatment: 12 Constitutional Sitting or standing Blood Pressure is within target range for patient.. Pulse regular and within target range for patient.. Respirations regular, non-labored and within target range.. Temperature is normal and within the target range for the patient.. Appears in no distress. Cardiovascular Pedal pulses are palpable on the right. Notes Wound exam; the area on the right medial first MTP very small wound healthy granulation at the bottom there is 0.2 cm of depth. No need for debridement what I can see of this looks healthy there may be some undermining we did not have anything to measure. There is no erythema around the wound this does not go to bone Electronic Signature(s) Signed: 08/03/2020 4:16:43 PM By: [name], [name] MD Entered By: [name], [name] on 08/03/2020 11:14:27 -------------------------------------------------------------------------------- Physician Orders Details Patient Name: Date of Service: HA IRSTO N, NELSO N 08/03/2020 10:00 A M Medical Record Number: [id number] Patient Account Number: [id number] Date of Birth/Sex: Treating RN: 09/30/1925 (  87 y.o. M) [name], [name] Primary Care Provider: [name], [name] Other Clinician: Referring Provider: Treating Provider/Extender: [name], [name] [name], [name] Weeks in Treatment: 12 Verbal / Phone Orders: No Diagnosis Coding Follow-up Appointments Return Appointment in 2 weeks. Bathing/ Shower/ Hygiene May shower with protection but do not get wound dressing(s) wet. Off-Loading Open toe surgical shoe to: - right foot Home Health New wound care orders this week; continue Home Health for wound care. May utilize formulary equivalent dressing for wound treatment orders unless otherwise specified. - [facility] orders for right med. foot only. Left foot wound orders should come from Dr.  Cain. Dressing changes to be completed by Home Health on Monday / Wednesday / Friday except when patient has scheduled visit [facility]. Other Home Health Orders/Instructions: - [facility] Wound #1 - Foot Wound Laterality: Right, Medial Cleanser: Wound Cleanser (Home Health) 3 x Per Week/7 Days Discharge Instructions: Cleanse the wound with wound cleanser or normal saline prior to applying a clean dressing using gauze sponges, not tissue or cotton balls. Prim Dressing: Endoform 2x2 in (Home Health) 3 x Per Week/7 Days ary Discharge Instructions: Moisten with saline Secondary Dressing: Woven Gauze Sponges 2x2 in (Home Health) 3 x Per Week/7 Days Discharge Instructions: Apply saline moistened gauze over Endoform to hold in place Secondary Dressing: Optifoam Non-Adhesive Dressing, 4x4 in (Home Health) 3 x Per Week/7 Days Discharge Instructions: Foam donut Secured With: Kerlix Roll Sterile, 4.5x3.1 (in/yd) (Home Health) 3 x Per Week/7 Days Discharge Instructions: Secure with Kerlix as directed. Secured With: Paper Tape, 2x10 (in/yd) (Home Health) 3 x Per Week/7 Days Discharge Instructions: Secure dressing with tape as directed. Electronic Signature(s) Signed: 08/03/2020 4:16:43 PM By: [name], [name] MD Signed: 08/03/2020 5:09:03 PM By: [name], [name] RN Entered By: [name], [name] on 08/03/2020 10:57:51 -------------------------------------------------------------------------------- Problem List Details Patient Name: Date of Service: HA IRSTO N, NELSO N 08/03/2020 10:00 A M Medical Record Number: [id number] Patient Account Number: [id number] Date of Birth/Sex: Treating RN: 02/23/1926 (87 y.o. M) [name], [name] Primary Care Provider: [name], [name] Other Clinician: Referring Provider: Treating Provider/Extender: [name], [name] [name], [name] Weeks in Treatment: 12 Active Problems ICD-10 Encounter Code Description Active Date MDM Diagnosis E11.621 Type 2  diabetes mellitus with foot ulcer 05/10/2020 No Yes E11.51 Type 2 diabetes mellitus with diabetic peripheral angiopathy without gangrene 05/10/2020 No Yes L97.514 Non-pressure chronic ulcer of other part of right foot with necrosis of bone 05/10/2020 No Yes L97.518 Non-pressure chronic ulcer of other part of right foot with other specified 05/17/2020 No Yes severity Inactive Problems ICD-10 Code Description Active Date Inactive Date L97.524 Non-pressure chronic ulcer of other part of left foot with necrosis of bone 06/07/2020 06/07/2020 Resolved Problems ICD-10 Code Description Active Date Resolved Date S80.211D Abrasion, right knee, subsequent encounter 05/10/2020 05/10/2020 Electronic Signature(s) Signed: 08/03/2020 4:16:43 PM By: [name], [name] MD Entered By: [name], [name] on 08/03/2020 11:06:42 -------------------------------------------------------------------------------- Progress Note Details Patient Name: Date of Service: HA IRSTO N, NELSO N 08/03/2020 10:00 A M Medical Record Number: [id number] Patient Account Number: [id number] Date of Birth/Sex: Treating RN: 08/24/1925 (87 y.o. M) [name], [name] Primary Care Provider: [name], [name] Other Clinician: Referring Provider: Treating Provider/Extender: [name], [name] [name], [name] Weeks in Treatment: 12 Subjective History of Present Illness (HPI) ADMISSION 05/10/2020 This is a independent 89-year-old man who has had an area on his right medial first metatarsal head according to him for roughly 3 months. This started as a skin tear that scab he removed the scab and inside an area on   this since. He is a type II diabetic also had an area on his left second toe and right first metatarsal head in November so this wound may have been present longer than not. He has undergone extensive arterial review including a stent in the right popliteal laser atherectomy of the right popliteal and anterior tibial arteries on 01/19/2020. On 02/23/2020 he had a  laser atherectomy of the left peroneal angioplasty of the left peroneal and amputation of the left second toe. Follow-up noninvasive studies on 03/05/2020 showed an ABI on the right of 0.75 with a TBI of 0.39. On the left his ABI was 0.77, his left great toe was amputated. He is not experiencing any pain. ALSO he had a right knee skin tear within the last week or 2. He is not really dressing either 1 of these areas Past medical; pacemaker, type 2 diabetes with PAD, osteomyelitis of the left first toe amputated on 09/29/2019, hypertension hypercholesterolemia, COPD 2/28; this is a 89-year-old man who arrives I believe with his son. He is a type II diabetic. He has a wound on the medial part of the first metatarsal head. Culture I did last time showed acid Enterobacter as well as rare Enterococcus. Will be giving him Augmentin to cover both of these for 10 days. His x-ray did not show osteomyelitis although they did comment on possible cortical erosion along the medial aspect of the head of the first metatarsal. He will definitely need an MRI. He comes into the clinic today with a new wound on the left third toe dorsal aspect over the PIP this also has exposed bone 05/25/2020 upon evaluation today patient appears to be doing well with regard to his wounds currently. Both are fairly clean. He is having a CT scan of his right foot on Friday. With that being said the toe on the left foot appears to be doing okay this is not too deep. There does not appear to be any signs of active infection at this time. No fevers, chills, nausea, vomiting, or diarrhea. 3/17; this is a patient with a wound on the medial aspect of his right first MTP. This is in the setting of severe PAD with type 2 diabetes. He has previously been revascularized on the side on 02/23/2020 however his post procedure noninvasive studies showed an ABI of 0.75 and a TBI of 0.39. He was supposed to have follow-up noninvasive studies today but  because he was here and we were late he missed them but were going to try to book him sometime this week. Patient could not have an MRI of the right foot because of a pacemaker. He did have a CT scan that showed marginal erosions along the medial aspect of the first metatarsal head which may reflect osteomyelitis. The radiologist also queries underlying arthropathy such as gout. He came in 2 weeks ago with a new wound on his left third toe. This was x-rayed on 3/16 that did not show obvious osteomyelitis by plain 3/21 semi the patient had his arterial studies on 3/18. These showed an ABI on the right of 0.69 with a TBI of 0.35 and monophasic waveforms at the dorsalis pedis, biphasic at the posterior tibial. On the left he had an ABI of 0.85, great toe amputation monophasic waveforms diffusely. He has an area on the medial part of his right first metatarsal head which probes to bone suspicion of underlying osteomyelitis. He has a wound on the tip of his left third toe which also   has exposed bone. He is been reviewed by Dr. Cain on 06/04/2020. He is going to have a right lower extremity angiogram using CO2 1 week from today. 4/1; the patient went to the Cath Lab by Dr. Cain of vein and vascular. He underwent laser atherectomy of the right anterior tibial artery and a balloon angioplasty of the right anterior tibial artery. He has wounds on the medial right first metatarsal head and his left third toe. The left third toe was not looking at all healthy. Exposed bone in fact I think part of the joint was protruding today. This is not going to be salvageable. 4/8; patient has a follow-up with Dr. Cain I am going to ask him to look at the third toe. This is not going to be salvageable and needs to be amputated. He has a palpable dorsalis pedis pulse on that side I am hopeful he has enough blood flow to heal this. 5/3 the patient had his left third toe amputated. Since then he has been followed with Dr. Cain and  sees him this afternoon. The area on the right first met head much better than when I last saw this. He has been revascularized on this side 5/17; he says the left third toe amputation site is healed I did not look at this today. The area on his right medial first metatarsal head is a lot better small wound apparently 2 mm in depth with 2 mm undermining. We have been using endoform Objective Constitutional Sitting or standing Blood Pressure is within target range for patient.. Pulse regular and within target range for patient.. Respirations regular, non-labored and within target range.. Temperature is normal and within the target range for the patient.. Appears in no distress. Vitals Time Taken: 10:14 AM, Height: 73 in, Source: Stated, Weight: 217 lbs, Source: Stated, BMI: 28.6, Temperature: 97.6 °F, Pulse: 77 bpm, Respiratory Rate: 18 breaths/min, Blood Pressure: 129/62 mmHg, Capillary Blood Glucose: 119 mg/dl. General Notes: glucose per pt report this morning Cardiovascular Pedal pulses are palpable on the right. General Notes: Wound exam; the area on the right medial first MTP very small wound healthy granulation at the bottom there is 0.2 cm of depth. No need for debridement what I can see of this looks healthy there may be some undermining we did not have anything to measure. There is no erythema around the wound this does not go to bone Integumentary (Hair, Skin) Wound #1 status is Open. Original cause of wound was Blister. The date acquired was: 02/18/2020. The wound has been in treatment 12 weeks. The wound is located on the Right,Medial Foot. The wound measures 0.2cm length x 0.2cm width x 0.2cm depth; 0.031cm^2 area and 0.006cm^3 volume. There is Fat Layer (Subcutaneous Tissue) exposed. There is no tunneling noted, however, there is undermining starting at 12:00 and ending at 12:00 with a maximum distance of 0.2cm. There is a small amount of serosanguineous drainage noted. The wound  margin is well defined and not attached to the wound base. There is large (67- 100%) red granulation within the wound bed. There is no necrotic tissue within the wound bed. Assessment Active Problems ICD-10 Type 2 diabetes mellitus with foot ulcer Type 2 diabetes mellitus with diabetic peripheral angiopathy without gangrene Non-pressure chronic ulcer of other part of right foot with necrosis of bone Non-pressure chronic ulcer of other part of right foot with other specified severity Plan Follow-up Appointments: Return Appointment in 2 weeks. Bathing/ Shower/ Hygiene: May shower with protection but do   not get wound dressing(s) wet. Off-Loading: Open toe surgical shoe to: - right foot Home Health: New wound care orders this week; continue Home Health for wound care. May utilize formulary equivalent dressing for wound treatment orders unless otherwise specified. - [facility] orders for right med. foot only. Left foot wound orders should come from Dr. Donzetta Matters. Dressing changes to be completed by [facility] on Monday / Wednesday / Friday except when patient has scheduled visit at [facility]. Other Home Health Orders/Instructions: - [facility] WOUND #1: - Foot Wound Laterality: Right, Medial Cleanser: Wound Cleanser (Home Health) 3 x Per Week/7 Days Discharge Instructions: Cleanse the wound with wound cleanser or normal saline prior to applying a clean dressing using gauze sponges, not tissue or cotton balls. Prim Dressing: Endoform 2x2 in ([facility]) 3 x Per Week/7 Days ary Discharge Instructions: Moisten with saline Secondary Dressing: Woven Gauze Sponges 2x2 in (Home Health) 3 x Per Week/7 Days Discharge Instructions: Apply saline moistened gauze over Endoform to hold in place Secondary Dressing: Optifoam Non-Adhesive Dressing, 4x4 in (Home Health) 3 x Per Week/7 Days Discharge Instructions: Foam donut Secured With: Kerlix Roll Sterile, 4.5x3.1 (in/yd) ([facility]) 3 x Per Week/7  Days Discharge Instructions: Secure with Kerlix as directed. Secured With: Paper T ape, 2x10 (in/yd) (Home Health) 3 x Per Week/7 Days Discharge Instructions: Secure dressing with tape as directed. 1. I am still continuing the endoform hopefully the wound will continue to contract 2. No evidence of infection 3. We have gotten him revascularized since then things have really been improving. Noteworthy that he has palpable dorsalis pedis and posterior tibial pulse Electronic Signature(s) Signed: 08/03/2020 4:16:43 PM By: Linton Ham MD Entered By: Linton Ham on 08/03/2020 11:15:09 -------------------------------------------------------------------------------- SuperBill Details Patient Name: Date of Service: Gretta Arab 08/03/2020 Medical Record Number: [id number] Patient Account Number: [id number] Date of Birth/Sex: Treating RN: Jan 30, 1926 (87 y.o. [name], [name] Primary Care Provider: [name] Other Clinician: Referring Provider: Treating Provider/Extender: Greta Doom Weeks in Treatment: 12 Diagnosis Coding ICD-10 Codes Code Description E11.621 Type 2 diabetes mellitus with foot ulcer E11.51 Type 2 diabetes mellitus with diabetic peripheral angiopathy without gangrene L97.514 Non-pressure chronic ulcer of other part of right foot with necrosis of bone L97.518 Non-pressure chronic ulcer of other part of right foot with other specified severity Facility Procedures CPT4 Code: [id number] Description: 99213 - WOUND CARE VISIT-LEV 3 EST PT Modifier: Quantity: 1 Physician Procedures : CPT4 Code Description Modifier [id number] - WC PHYS LEVEL 3 - EST PT ICD-10 Diagnosis Description L97.514 Non-pressure chronic ulcer of other part of right foot with necrosis of bone E11.51 Type 2 diabetes mellitus with diabetic peripheral  angiopathy without gangrene Quantity: 1 Electronic Signature(s) Signed: 08/03/2020 4:16:43 PM By: Linton Ham MD Entered  By: Linton Ham on 08/03/2020 11:15:31

## 2020-08-04 NOTE — Progress Notes (Signed)
TAGGART, PRASAD ([id number]) Visit Report for 08/03/2020 Arrival Information Details Patient Name: Date of Service: [name] 08/03/2020 10:00 [place] Record Number: [id number] Patient Account Number: [id number] Date of Birth/Sex: Treating RN: 11/16/1925 (87 y.o. Peter James, Vaughan Basta Primary Care Jeane Cashatt: [name] Other Clinician: Referring Blas Riches: Treating Karl Knarr/Extender: Greta Doom Weeks in Treatment: 12 Visit Information History Since Last Visit Added or deleted any medications: No Patient Arrived: Peter James Any new allergies or adverse reactions: No Arrival Time: 10:13 Had a fall or experienced change in No Accompanied By: friend activities of daily living that may affect Transfer Assistance: None risk of falls: Patient Identification Verified: Yes Signs or symptoms of abuse/neglect since No Secondary Verification Process Completed: Yes last visito Patient Requires Transmission-Based No Hospitalized since last visit: No Precautions: Implantable device outside of the clinic No Patient Has Alerts: Yes excluding Patient Alerts: ABI's 12/21: R:0.75 L0.77 cellular tissue based products placed in the center since last visit: Has Dressing in Place as Prescribed: Yes Has Footwear/Offloading in Place as Yes Prescribed: Left: Surgical Shoe with Pressure Relief Insole Right: Surgical Shoe with Pressure Relief Insole Pain Present Now: No Electronic Signature(s) Signed: 08/03/2020 5:35:12 PM By: Baruch Gouty RN, BSN Entered By: Baruch Gouty on 08/03/2020 10:14:33 -------------------------------------------------------------------------------- Clinic Level of Care Assessment Details Patient Name: Date of Service: [name] 08/03/2020 10:00 [place] Record Number: [id number] Patient Account Number: [id number] Date of Birth/Sex: Treating RN: 01/30/1926 (87 y.o. Peter James, Peter Primary Care Brookelynne Dimperio: [name] Other  Clinician: Referring Takia Runyon: Treating Robbi Spells/Extender: Greta Doom Weeks in Treatment: 12 Clinic Level of Care Assessment Items TOOL 4 Quantity Score X- 1 0 Use when only an EandM is performed on FOLLOW-UP visit ASSESSMENTS - Nursing Assessment / Reassessment X- 1 10 Reassessment of Co-morbidities (includes updates in patient status) X- 1 5 Reassessment of Adherence to Treatment Plan ASSESSMENTS - Wound and Skin A ssessment / Reassessment X - Simple Wound Assessment / Reassessment - one wound 1 5 []  - 0 Complex Wound Assessment / Reassessment - multiple wounds X- 1 10 Dermatologic / Skin Assessment (not related to wound area) ASSESSMENTS - Focused Assessment X- 1 5 Circumferential Edema Measurements - multi extremities []  - 0 Nutritional Assessment / Counseling / Intervention []  - 0 Lower Extremity Assessment (monofilament, tuning fork, pulses) []  - 0 Peripheral Arterial Disease Assessment (using hand held doppler) ASSESSMENTS - Ostomy and/or Continence Assessment and Care []  - 0 Incontinence Assessment and Management []  - 0 Ostomy Care Assessment and Management (repouching, etc.) PROCESS - Coordination of Care X - Simple Patient / Family Education for ongoing care 1 15 []  - 0 Complex (extensive) Patient / Family Education for ongoing care X- 1 10 Staff obtains Programmer, systems, Records, T Results / Process Orders est []  - 0 Staff telephones HHA, Nursing Homes / Clarify orders / etc []  - 0 Routine Transfer to another Facility (non-emergent condition) []  - 0 Routine Hospital Admission (non-emergent condition) []  - 0 New Admissions / Biomedical engineer / Ordering NPWT Apligraf, etc. , []  - 0 Emergency Hospital Admission (emergent condition) X- 1 10 Simple Discharge Coordination []  - 0 Complex (extensive) Discharge Coordination PROCESS - Special Needs []  - 0 Pediatric / Minor Patient Management []  - 0 Isolation Patient Management []  -  0 Hearing / Language / Visual special needs []  - 0 Assessment of Community assistance (transportation, D/C planning, etc.) []  - 0 Additional assistance / Altered mentation []  - 0 Support Surface(s)  Assessment (bed, cushion, seat, etc.) INTERVENTIONS - Wound Cleansing / Measurement X - Simple Wound Cleansing - one wound 1 5 []  - 0 Complex Wound Cleansing - multiple wounds X- 1 5 Wound Imaging (photographs - any number of wounds) []  - 0 Wound Tracing (instead of photographs) X- 1 5 Simple Wound Measurement - one wound []  - 0 Complex Wound Measurement - multiple wounds INTERVENTIONS - Wound Dressings X - Small Wound Dressing one or multiple wounds 1 10 []  - 0 Medium Wound Dressing one or multiple wounds []  - 0 Large Wound Dressing one or multiple wounds []  - 0 Application of Medications - topical []  - 0 Application of Medications - injection INTERVENTIONS - Miscellaneous []  - 0 External ear exam []  - 0 Specimen Collection (cultures, biopsies, blood, body fluids, etc.) []  - 0 Specimen(s) / Culture(s) sent or taken to Lab for analysis []  - 0 Patient Transfer (multiple staff / Civil Service fast streamer / Similar devices) []  - 0 Simple Staple / Suture removal (25 or less) []  - 0 Complex Staple / Suture removal (26 or more) []  - 0 Hypo / Hyperglycemic Management (close monitor of Blood Glucose) []  - 0 Ankle / Brachial Index (ABI) - do not check if billed separately X- 1 5 Vital Signs Has the patient been seen at the hospital within the last three years: Yes Total Score: 100 Level Of Care: New/Established - Level 3 Electronic Signature(s) Signed: 08/03/2020 5:09:03 PM By: Rhae Hammock RN Entered By: Rhae Hammock on 08/03/2020 11:02:51 -------------------------------------------------------------------------------- Encounter Discharge Information Details Patient Name: Date of Service: [name] 08/03/2020 10:00 A M Medical Record Number: [id number] Patient Account  Number: [id number] Date of Birth/Sex: Treating RN: [birth year] (87 y.o. Peter James Primary Care Nixxon Faria: [name] Other Clinician: Referring Clairissa Valvano: Treating Belinda Schlichting/Extender: Greta Doom Weeks in Treatment: 12 Encounter Discharge Information Items Discharge Condition: Stable Ambulatory Status: Cane Discharge Destination: Home Transportation: Private Auto Accompanied By: friend Schedule Follow-up Appointment: Yes Clinical Summary of Care: Patient Declined Electronic Signature(s) Signed: 08/03/2020 5:35:12 PM By: Baruch Gouty RN, BSN Entered By: Baruch Gouty on 08/03/2020 14:40:08 -------------------------------------------------------------------------------- Lower Extremity Assessment Details Patient Name: Date of Service: [name] 08/03/2020 10:00 [place] Record Number: [id number] Patient Account Number: [id number] Date of Birth/Sex: Treating RN: 1925/07/29 (87 y.o. Peter James Primary Care Kellen Hover: [name] Other Clinician: Referring Mainor Hellmann: Treating Jiovany Scheffel/Extender: Greta Doom Weeks in Treatment: 12 Edema Assessment Assessed: [Left: No] [Right: No] Edema: [Left: James] [Right: o] Calf Left: Right: Point of Measurement: 42 cm From Medial Instep 36.3 cm Ankle Left: Right: Point of Measurement: 23 cm From Medial Instep 23.5 cm Vascular Assessment Pulses: Dorsalis Pedis Palpable: [Right:Yes] Electronic Signature(s) Signed: 08/03/2020 5:35:12 PM By: Baruch Gouty RN, BSN Entered By: Baruch Gouty on 08/03/2020 10:17:51 -------------------------------------------------------------------------------- Multi Wound Chart Details Patient Name: Date of Service: [name] 08/03/2020 10:00 [place] Record Number: [id number] Patient Account Number: [id number] Date of Birth/Sex: Treating RN: 06-Mar-1926 (87 y.o. Peter James Primary Care Kassadi Presswood: [name] Other  Clinician: Referring Lavonne Kinderman: Treating Emmalina Espericueta/Extender: Greta Doom Weeks in Treatment: 12 Vital Signs Height(in): 73 Capillary Blood Glucose(mg/dl): 119 Weight(lbs): 217 Pulse(bpm): 70 Body Mass Index(BMI): 29 Blood Pressure(mmHg): 129/62 Temperature(F): 97.6 Respiratory Rate(breaths/min): 18 Photos: [1:No Photos Right, Medial Foot] [James/A:James/A James/A] Wound Location: [1:Blister] [James/A:James/A] Wounding Event: [1:Diabetic Wound/Ulcer of the Lower] [James/A:James/A] Primary Etiology: [1:Extremity Coronary Artery Disease,] [James/A:James/A] Comorbid History: [1:Hypertension, Peripheral Arterial Disease, Type  II Diabetes, Neuropathy 02/18/2020] [James/A:James/A] Date Acquired: [1:12] [James/A:James/A] Weeks of Treatment: [1:Open] [James/A:James/A] Wound Status: [1:0.2x0.2x0.2] [James/A:James/A] Measurements L x W x D (cm) [1:0.031] [James/A:James/A] A (cm) : rea [1:0.006] [James/A:James/A] Volume (cm) : [1:67.00%] [James/A:James/A] % Reduction in A rea: [1:92.00%] [James/A:James/A] % Reduction in Volume: [1:12] Starting Position 1 (o'clock): [1:12] Ending Position 1 (o'clock): [1:0.2] Maximum Distance 1 (cm): [1:Yes] [James/A:James/A] Undermining: [1:Grade 2] [James/A:James/A] Classification: [1:Small] [James/A:James/A] Exudate A mount: [1:Serosanguineous] [James/A:James/A] Exudate Type: [1:red, brown] [James/A:James/A] Exudate Color: [1:Well defined, not attached] [James/A:James/A] Wound Margin: [1:Large (67-100%)] [James/A:James/A] Granulation A mount: [1:Red] [James/A:James/A] Granulation Quality: [1:None Present (0%)] [James/A:James/A] Necrotic A mount: [1:Fat Layer (Subcutaneous Tissue): Yes James/A] Exposed Structures: [1:Fascia: No Tendon: No Muscle: No Joint: No Bone: No None] [James/A:James/A] Epithelialization: Treatment Notes Electronic Signature(s) Signed: 08/03/2020 4:16:43 PM By: Linton Ham MD Signed: 08/03/2020 5:09:03 PM By: Rhae Hammock RN Entered By: Linton Ham on 08/03/2020 11:06:54 -------------------------------------------------------------------------------- Multi-Disciplinary Care  Plan Details Patient Name: Date of Service: Peter James 08/03/2020 10:00 A M Medical Record Number: [id number] Patient Account Number: [id number] Date of Birth/Sex: Treating RN: 09-06-1925 (87 y.o. Peter James, Peter Primary Care Hadyn Azer: [name] Other Clinician: Referring Cherri Yera: Treating Minha Fulco/Extender: Greta Doom Weeks in Treatment: 12 Multidisciplinary Care Plan reviewed with physician Active Inactive Nutrition Nursing Diagnoses: Impaired glucose control: actual or potential Potential for alteratiion in Nutrition/Potential for imbalanced nutrition Goals: Patient/caregiver agrees to and verbalizes understanding of need to use nutritional supplements and/or vitamins as prescribed Date Initiated: 05/10/2020 Date Inactivated: 06/07/2020 Target Resolution Date: 06/11/2020 Goal Status: Met Patient/caregiver will maintain therapeutic glucose control Date Initiated: 05/10/2020 Target Resolution Date: 09/16/2020 Goal Status: Active Interventions: Assess HgA1c results as ordered upon admission and as needed Assess patient nutrition upon admission and as needed per policy Provide education on elevated blood sugars and impact on wound healing Provide education on nutrition Treatment Activities: Education provided on Nutrition : 08/03/2020 Notes: Wound/Skin Impairment Nursing Diagnoses: Impaired tissue integrity Knowledge deficit related to ulceration/compromised skin integrity Goals: Patient/caregiver will verbalize understanding of skin care regimen Date Initiated: 05/10/2020 Target Resolution Date: 08/14/2020 Goal Status: Active Ulcer/skin breakdown will have a volume reduction of 30% by week 4 Date Initiated: 05/10/2020 Date Inactivated: 06/07/2020 Target Resolution Date: 06/11/2020 Goal Status: Unmet Unmet Reason: Osteomyelitis, PAD Interventions: Assess patient/caregiver ability to obtain necessary supplies Assess patient/caregiver ability to  perform ulcer/skin care regimen upon admission and as needed Assess ulceration(s) every visit Provide education on ulcer and skin care Notes: Electronic Signature(s) Signed: 08/03/2020 5:09:03 PM By: Rhae Hammock RN Entered By: Rhae Hammock on 08/03/2020 10:59:25 -------------------------------------------------------------------------------- Pain Assessment Details Patient Name: Date of Service: [name] 08/03/2020 10:00 A M Medical Record Number: [id number] Patient Account Number: [id number] Date of Birth/Sex: Treating RN: Aug 08, 1925 (87 y.o. Peter James Primary Care Ovide Dusek: [name] Other Clinician: Referring Gussie Murton: Treating Elvena Oyer/Extender: Greta Doom Weeks in Treatment: 12 Active Problems Location of Pain Severity and Description of Pain Patient Has Paino No Site Locations Rate the pain. Current Pain Level: 0 Pain Management and Medication Current Pain Management: Electronic Signature(s) Signed: 08/03/2020 5:35:12 PM By: Baruch Gouty RN, BSN Entered By: Baruch Gouty on 08/03/2020 10:15:28 -------------------------------------------------------------------------------- Patient/Caregiver Education Details Patient Name: Date of Service: [name] 5/17/2022andnbsp10:00 [place] Record Number: [id number] Patient Account Number: [id number] Date of Birth/Gender: Treating RN: January 28, 1926 (87 y.o. Peter James Primary Care Physician: [name] Other Clinician: Referring Physician: Treating Physician/Extender: Ernestina Patches, [name] [place]ella [place]  in Treatment: 12 Education Assessment Education Provided To: Patient Education Topics Provided Elevated Blood Sugar/ Impact on Healing: Methods: Explain/Verbal Responses: Reinforcements needed Nutrition: Methods: Printed Wound/Skin Impairment: Methods: Explain/Verbal Electronic Signature(s) Signed: 08/03/2020 5:09:03 PM By: Rhae Hammock  RN Entered By: Rhae Hammock on 08/03/2020 10:59:46 -------------------------------------------------------------------------------- Wound Assessment Details Patient Name: Date of Service: [name] 08/03/2020 10:00 [place] Record Number: [id number] Patient Account Number: [id number] Date of Birth/Sex: Treating RN: 06/25/1925 (86 y.o. Peter James Primary Care Lilliane Sposito: [name] Other Clinician: Referring Darline Faith: Treating Keshan Reha/Extender: Greta Doom Weeks in Treatment: 12 Wound Status Wound Number: 1 Primary Diabetic Wound/Ulcer of the Lower Extremity Etiology: Wound Location: Right, Medial Foot Wound Open Wounding Event: Blister Status: Date Acquired: 02/18/2020 Comorbid Coronary Artery Disease, Hypertension, Peripheral Arterial Weeks Of Treatment: 12 History: Disease, Type II Diabetes, Neuropathy Clustered Wound: No Photos Wound Measurements Length: (cm) 0.2 Width: (cm) 0.2 Depth: (cm) 0.2 Area: (cm) 0.031 Volume: (cm) 0.006 % Reduction in Area: 67% % Reduction in Volume: 92% Epithelialization: None Tunneling: No Undermining: Yes Starting Position (o'clock): 12 Ending Position (o'clock): 12 Maximum Distance: (cm) 0.2 Wound Description Classification: Grade 2 Wound Margin: Well defined, not attached Exudate Amount: Small Exudate Type: Serosanguineous Exudate Color: red, brown Wound Bed Granulation Amount: Large (67-100%) Granulation Quality: Red Necrotic Amount: None Present (0%) Foul Odor After Cleansing: No Slough/Fibrino No Exposed Structure Fascia Exposed: No Fat Layer (Subcutaneous Tissue) Exposed: Yes Tendon Exposed: No Muscle Exposed: No Joint Exposed: No Bone Exposed: No Treatment Notes Wound #1 (Foot) Wound Laterality: Right, Medial Cleanser Wound Cleanser Discharge Instruction: Cleanse the wound with wound cleanser or normal saline prior to applying a clean dressing using gauze sponges,  not tissue or cotton balls. Peri-Wound Care Topical Primary Dressing Endoform 2x2 in Discharge Instruction: Moisten with saline Secondary Dressing Woven Gauze Sponges 2x2 in Discharge Instruction: Apply saline moistened gauze over Endoform to hold in place Optifoam Non-Adhesive Dressing, 4x4 in Discharge Instruction: Foam donut Secured With Kerlix Roll Sterile, 4.5x3.1 (in/yd) Discharge Instruction: Secure with Kerlix as directed. Paper Tape, 2x10 (in/yd) Discharge Instruction: Secure dressing with tape as directed. Compression Wrap Compression Stockings Add-Ons Electronic Signature(s) Signed: 08/03/2020 5:35:12 PM By: Baruch Gouty RN, BSN Signed: 08/04/2020 11:07:31 AM By: Sandre Kitty Entered By: Sandre Kitty on 08/03/2020 16:43:07 -------------------------------------------------------------------------------- Vitals Details Patient Name: Date of Service: [name] 08/03/2020 10:00 A M Medical Record Number: [id number] Patient Account Number: [id number] Date of Birth/Sex: Treating RN: [birth year] (87 y.o. Peter James Primary Care Nash Bolls: [name] Other Clinician: Referring Cylinda Santoli: Treating Gissella Niblack/Extender: Greta Doom Weeks in Treatment: 12 Vital Signs Time Taken: 10:14 Temperature (F): 97.6 Height (in): 73 Pulse (bpm): 77 Source: Stated Respiratory Rate (breaths/min): 18 Weight (lbs): 217 Blood Pressure (mmHg): 129/62 Source: Stated Capillary Blood Glucose (mg/dl): 119 Body Mass Index (BMI): 28.6 Reference Range: 80 - 120 mg / dl Notes glucose per pt report this morning Electronic Signature(s) Signed: 08/03/2020 5:35:12 PM By: Baruch Gouty RN, BSN Entered By: Baruch Gouty on 08/03/2020 10:15:19

## 2020-08-06 DIAGNOSIS — E1140 Type 2 diabetes mellitus with diabetic neuropathy, unspecified: Secondary | ICD-10-CM | POA: Diagnosis not present

## 2020-08-06 DIAGNOSIS — I5030 Unspecified diastolic (congestive) heart failure: Secondary | ICD-10-CM | POA: Diagnosis not present

## 2020-08-06 DIAGNOSIS — I2510 Atherosclerotic heart disease of native coronary artery without angina pectoris: Secondary | ICD-10-CM | POA: Diagnosis not present

## 2020-08-06 DIAGNOSIS — E1151 Type 2 diabetes mellitus with diabetic peripheral angiopathy without gangrene: Secondary | ICD-10-CM | POA: Diagnosis not present

## 2020-08-06 DIAGNOSIS — E11621 Type 2 diabetes mellitus with foot ulcer: Secondary | ICD-10-CM | POA: Diagnosis not present

## 2020-08-06 DIAGNOSIS — L97514 Non-pressure chronic ulcer of other part of right foot with necrosis of bone: Secondary | ICD-10-CM | POA: Diagnosis not present

## 2020-08-06 DIAGNOSIS — Z4781 Encounter for orthopedic aftercare following surgical amputation: Secondary | ICD-10-CM | POA: Diagnosis not present

## 2020-08-06 DIAGNOSIS — Z89422 Acquired absence of other left toe(s): Secondary | ICD-10-CM | POA: Diagnosis not present

## 2020-08-06 DIAGNOSIS — I110 Hypertensive heart disease with heart failure: Secondary | ICD-10-CM | POA: Diagnosis not present

## 2020-08-11 ENCOUNTER — Ambulatory Visit (INDEPENDENT_AMBULATORY_CARE_PROVIDER_SITE_OTHER): Payer: Medicare HMO

## 2020-08-11 DIAGNOSIS — L97514 Non-pressure chronic ulcer of other part of right foot with necrosis of bone: Secondary | ICD-10-CM | POA: Diagnosis not present

## 2020-08-11 DIAGNOSIS — Z89422 Acquired absence of other left toe(s): Secondary | ICD-10-CM | POA: Diagnosis not present

## 2020-08-11 DIAGNOSIS — E11621 Type 2 diabetes mellitus with foot ulcer: Secondary | ICD-10-CM | POA: Diagnosis not present

## 2020-08-11 DIAGNOSIS — E1151 Type 2 diabetes mellitus with diabetic peripheral angiopathy without gangrene: Secondary | ICD-10-CM | POA: Diagnosis not present

## 2020-08-11 DIAGNOSIS — Z4781 Encounter for orthopedic aftercare following surgical amputation: Secondary | ICD-10-CM | POA: Diagnosis not present

## 2020-08-11 DIAGNOSIS — I110 Hypertensive heart disease with heart failure: Secondary | ICD-10-CM | POA: Diagnosis not present

## 2020-08-11 DIAGNOSIS — E1140 Type 2 diabetes mellitus with diabetic neuropathy, unspecified: Secondary | ICD-10-CM | POA: Diagnosis not present

## 2020-08-11 DIAGNOSIS — I2510 Atherosclerotic heart disease of native coronary artery without angina pectoris: Secondary | ICD-10-CM | POA: Diagnosis not present

## 2020-08-11 DIAGNOSIS — I5030 Unspecified diastolic (congestive) heart failure: Secondary | ICD-10-CM | POA: Diagnosis not present

## 2020-08-11 DIAGNOSIS — I441 Atrioventricular block, second degree: Secondary | ICD-10-CM | POA: Diagnosis not present

## 2020-08-12 LAB — CUP PACEART REMOTE DEVICE CHECK
Battery Remaining Longevity: 101 mo
Battery Remaining Percentage: 95.5 %
Battery Voltage: 3.02 V
Brady Statistic AP VP Percent: 4.2 %
Brady Statistic AP VS Percent: 1 %
Brady Statistic AS VP Percent: 94 %
Brady Statistic AS VS Percent: 1 %
Brady Statistic RA Percent Paced: 4.5 %
Brady Statistic RV Percent Paced: 97 %
Date Time Interrogation Session: 20220525020014
Implantable Lead Implant Date: 20211124
Implantable Lead Implant Date: 20211124
Implantable Lead Location: 753859
Implantable Lead Location: 753860
Implantable Pulse Generator Implant Date: 20211124
Lead Channel Impedance Value: 460 Ohm
Lead Channel Impedance Value: 530 Ohm
Lead Channel Pacing Threshold Amplitude: 0.5 V
Lead Channel Pacing Threshold Amplitude: 0.75 V
Lead Channel Pacing Threshold Pulse Width: 0.5 ms
Lead Channel Pacing Threshold Pulse Width: 0.5 ms
Lead Channel Sensing Intrinsic Amplitude: 12 mV
Lead Channel Sensing Intrinsic Amplitude: 2.4 mV
Lead Channel Setting Pacing Amplitude: 2 V
Lead Channel Setting Pacing Amplitude: 2.5 V
Lead Channel Setting Pacing Pulse Width: 0.5 ms
Lead Channel Setting Sensing Sensitivity: 4 mV
Pulse Gen Model: 2272
Pulse Gen Serial Number: 3875505

## 2020-08-16 DIAGNOSIS — Z4781 Encounter for orthopedic aftercare following surgical amputation: Secondary | ICD-10-CM | POA: Diagnosis not present

## 2020-08-16 DIAGNOSIS — L97514 Non-pressure chronic ulcer of other part of right foot with necrosis of bone: Secondary | ICD-10-CM | POA: Diagnosis not present

## 2020-08-16 DIAGNOSIS — E1140 Type 2 diabetes mellitus with diabetic neuropathy, unspecified: Secondary | ICD-10-CM | POA: Diagnosis not present

## 2020-08-16 DIAGNOSIS — I5030 Unspecified diastolic (congestive) heart failure: Secondary | ICD-10-CM | POA: Diagnosis not present

## 2020-08-16 DIAGNOSIS — E11621 Type 2 diabetes mellitus with foot ulcer: Secondary | ICD-10-CM | POA: Diagnosis not present

## 2020-08-16 DIAGNOSIS — Z89422 Acquired absence of other left toe(s): Secondary | ICD-10-CM | POA: Diagnosis not present

## 2020-08-16 DIAGNOSIS — I2510 Atherosclerotic heart disease of native coronary artery without angina pectoris: Secondary | ICD-10-CM | POA: Diagnosis not present

## 2020-08-16 DIAGNOSIS — E1151 Type 2 diabetes mellitus with diabetic peripheral angiopathy without gangrene: Secondary | ICD-10-CM | POA: Diagnosis not present

## 2020-08-16 DIAGNOSIS — I110 Hypertensive heart disease with heart failure: Secondary | ICD-10-CM | POA: Diagnosis not present

## 2020-08-17 ENCOUNTER — Encounter (HOSPITAL_BASED_OUTPATIENT_CLINIC_OR_DEPARTMENT_OTHER): Payer: Medicare HMO | Admitting: Internal Medicine

## 2020-08-18 DIAGNOSIS — Z1331 Encounter for screening for depression: Secondary | ICD-10-CM | POA: Diagnosis not present

## 2020-08-18 DIAGNOSIS — N1832 Chronic kidney disease, stage 3b: Secondary | ICD-10-CM | POA: Diagnosis not present

## 2020-08-18 DIAGNOSIS — F3342 Major depressive disorder, recurrent, in full remission: Secondary | ICD-10-CM | POA: Diagnosis not present

## 2020-08-18 DIAGNOSIS — Z0000 Encounter for general adult medical examination without abnormal findings: Secondary | ICD-10-CM | POA: Diagnosis not present

## 2020-08-18 DIAGNOSIS — Z7189 Other specified counseling: Secondary | ICD-10-CM | POA: Diagnosis not present

## 2020-08-18 DIAGNOSIS — Z299 Encounter for prophylactic measures, unspecified: Secondary | ICD-10-CM | POA: Diagnosis not present

## 2020-08-18 DIAGNOSIS — L97519 Non-pressure chronic ulcer of other part of right foot with unspecified severity: Secondary | ICD-10-CM | POA: Diagnosis not present

## 2020-08-18 DIAGNOSIS — Z1339 Encounter for screening examination for other mental health and behavioral disorders: Secondary | ICD-10-CM | POA: Diagnosis not present

## 2020-08-18 DIAGNOSIS — Z6826 Body mass index (BMI) 26.0-26.9, adult: Secondary | ICD-10-CM | POA: Diagnosis not present

## 2020-08-18 DIAGNOSIS — E1165 Type 2 diabetes mellitus with hyperglycemia: Secondary | ICD-10-CM | POA: Diagnosis not present

## 2020-08-18 DIAGNOSIS — I10 Essential (primary) hypertension: Secondary | ICD-10-CM | POA: Diagnosis not present

## 2020-08-20 DIAGNOSIS — I110 Hypertensive heart disease with heart failure: Secondary | ICD-10-CM | POA: Diagnosis not present

## 2020-08-20 DIAGNOSIS — E785 Hyperlipidemia, unspecified: Secondary | ICD-10-CM | POA: Diagnosis not present

## 2020-08-20 DIAGNOSIS — E1151 Type 2 diabetes mellitus with diabetic peripheral angiopathy without gangrene: Secondary | ICD-10-CM | POA: Diagnosis not present

## 2020-08-20 DIAGNOSIS — E1140 Type 2 diabetes mellitus with diabetic neuropathy, unspecified: Secondary | ICD-10-CM | POA: Diagnosis not present

## 2020-08-20 DIAGNOSIS — I2510 Atherosclerotic heart disease of native coronary artery without angina pectoris: Secondary | ICD-10-CM | POA: Diagnosis not present

## 2020-08-20 DIAGNOSIS — L97514 Non-pressure chronic ulcer of other part of right foot with necrosis of bone: Secondary | ICD-10-CM | POA: Diagnosis not present

## 2020-08-20 DIAGNOSIS — E11621 Type 2 diabetes mellitus with foot ulcer: Secondary | ICD-10-CM | POA: Diagnosis not present

## 2020-08-20 DIAGNOSIS — I5030 Unspecified diastolic (congestive) heart failure: Secondary | ICD-10-CM | POA: Diagnosis not present

## 2020-08-20 DIAGNOSIS — I4891 Unspecified atrial fibrillation: Secondary | ICD-10-CM | POA: Diagnosis not present

## 2020-08-23 DIAGNOSIS — I4891 Unspecified atrial fibrillation: Secondary | ICD-10-CM | POA: Diagnosis not present

## 2020-08-23 DIAGNOSIS — I2510 Atherosclerotic heart disease of native coronary artery without angina pectoris: Secondary | ICD-10-CM | POA: Diagnosis not present

## 2020-08-23 DIAGNOSIS — I5030 Unspecified diastolic (congestive) heart failure: Secondary | ICD-10-CM | POA: Diagnosis not present

## 2020-08-23 DIAGNOSIS — E1151 Type 2 diabetes mellitus with diabetic peripheral angiopathy without gangrene: Secondary | ICD-10-CM | POA: Diagnosis not present

## 2020-08-23 DIAGNOSIS — L97514 Non-pressure chronic ulcer of other part of right foot with necrosis of bone: Secondary | ICD-10-CM | POA: Diagnosis not present

## 2020-08-23 DIAGNOSIS — E1140 Type 2 diabetes mellitus with diabetic neuropathy, unspecified: Secondary | ICD-10-CM | POA: Diagnosis not present

## 2020-08-23 DIAGNOSIS — I110 Hypertensive heart disease with heart failure: Secondary | ICD-10-CM | POA: Diagnosis not present

## 2020-08-23 DIAGNOSIS — E785 Hyperlipidemia, unspecified: Secondary | ICD-10-CM | POA: Diagnosis not present

## 2020-08-23 DIAGNOSIS — E11621 Type 2 diabetes mellitus with foot ulcer: Secondary | ICD-10-CM | POA: Diagnosis not present

## 2020-08-24 ENCOUNTER — Encounter (HOSPITAL_BASED_OUTPATIENT_CLINIC_OR_DEPARTMENT_OTHER): Payer: Medicare HMO | Attending: Internal Medicine | Admitting: Internal Medicine

## 2020-08-24 ENCOUNTER — Other Ambulatory Visit: Payer: Self-pay

## 2020-08-24 DIAGNOSIS — Z09 Encounter for follow-up examination after completed treatment for conditions other than malignant neoplasm: Secondary | ICD-10-CM | POA: Insufficient documentation

## 2020-08-24 DIAGNOSIS — L97512 Non-pressure chronic ulcer of other part of right foot with fat layer exposed: Secondary | ICD-10-CM | POA: Diagnosis not present

## 2020-08-24 DIAGNOSIS — E1151 Type 2 diabetes mellitus with diabetic peripheral angiopathy without gangrene: Secondary | ICD-10-CM | POA: Insufficient documentation

## 2020-08-24 DIAGNOSIS — E11621 Type 2 diabetes mellitus with foot ulcer: Secondary | ICD-10-CM | POA: Diagnosis not present

## 2020-08-24 DIAGNOSIS — Z89422 Acquired absence of other left toe(s): Secondary | ICD-10-CM | POA: Insufficient documentation

## 2020-08-24 DIAGNOSIS — L97514 Non-pressure chronic ulcer of other part of right foot with necrosis of bone: Secondary | ICD-10-CM | POA: Insufficient documentation

## 2020-08-24 NOTE — Progress Notes (Signed)
Peter James, Peter James ([id number]) Visit Report for 08/24/2020 HPI Details Patient Name: Date of Service: [name] 08/24/2020 10:15 A M Medical Record Number: [id number] Patient Account Number: [id number] Date of Birth/Sex: Treating RN: [birth year] (87 y.o. [name] Primary Care Provider: [name] Other Clinician: Referring Provider: Treating Provider/Extender: Greta Doom Weeks in Treatment: 15 History of Present Illness HPI Description: ADMISSION 05/10/2020 This is a independent 87 year old man who has had an area on his right medial first metatarsal head according to him for roughly 3 months. This started as a skin tear that scab he removed the scab and inside an area on this since. He is a type II diabetic also had an area on his left second toe and right first metatarsal head in November so this wound may have been present longer than not. He has undergone extensive arterial review including a stent in the right popliteal laser atherectomy of the right popliteal and anterior tibial arteries on 01/19/2020. On 02/23/2020 he had a laser atherectomy of the left peroneal angioplasty of the left peroneal and amputation of the left second toe. Follow-up noninvasive studies on 03/05/2020 showed an ABI on the right of 0.75 with a TBI of 0.39. On the left his ABI was 0.77, his left great toe was amputated. He is not experiencing any pain. ALSO he had a right knee skin tear within the last week or 2. He is not really dressing either 1 of these areas Past medical; pacemaker, type 2 diabetes with PAD, osteomyelitis of the left first toe amputated on 09/29/2019, hypertension hypercholesterolemia, COPD 2/28; this is a 87 year old man who arrives I believe with his son. He is a type II diabetic. He has a wound on the medial part of the first metatarsal head. Culture I did last time showed acid Enterobacter as well as rare Enterococcus. Will be giving him Augmentin to cover both  of these for 10 days. His x-ray did not show osteomyelitis although they did comment on possible cortical erosion along the medial aspect of the head of the first metatarsal. He will definitely need an MRI. He comes into the clinic today with a new wound on the left third toe dorsal aspect over the PIP this also has exposed bone 05/25/2020 upon evaluation today patient appears to be doing well with regard to his wounds currently. Both are fairly clean. He is having a CT scan of his right foot on Friday. With that being said the toe on the left foot appears to be doing okay this is not too deep. There does not appear to be any signs of active infection at this time. No fevers, chills, nausea, vomiting, or diarrhea. 3/17; this is a patient with a wound on the medial aspect of his right first MTP. This is in the setting of severe PAD with type 2 diabetes. He has previously been revascularized on the side on 02/23/2020 however his post procedure noninvasive studies showed an ABI of 0.75 and a TBI of 0.39. He was supposed to have follow-up noninvasive studies today but because he was here and we were late he missed them but were going to try to book him sometime this week. Patient could not have an MRI of the right foot because of a pacemaker. He did have a CT scan that showed marginal erosions along the medial aspect of the first metatarsal head which may reflect osteomyelitis. The radiologist also queries underlying arthropathy such as gout. He came in 2 weeks  ago with a new wound on his left third toe. This was x-rayed on 3/16 that did not show obvious osteomyelitis by plain 3/21 semi the patient had his arterial studies on 3/18. These showed an ABI on the right of 0.69 with a TBI of 0.35 and monophasic waveforms at the dorsalis pedis, biphasic at the posterior tibial. On the left he had an ABI of 0.85, great toe amputation monophasic waveforms diffusely. He has an area on the medial part of his right  first metatarsal head which probes to bone suspicion of underlying osteomyelitis. He has a wound on the tip of his left third toe which also has exposed bone. He is been reviewed by Dr. Donzetta Matters on 06/04/2020. He is going to have a right lower extremity angiogram using CO2 1 week from today. 4/1; the patient went to the Cath Lab by Dr. Donzetta Matters of vein and vascular. He underwent laser atherectomy of the right anterior tibial artery and a balloon angioplasty of the right anterior tibial artery. He has wounds on the medial right first metatarsal head and his left third toe. The left third toe was not looking at all healthy. Exposed bone in fact I think part of the joint was protruding today. This is not going to be salvageable. 4/8; patient has a follow-up with Dr. Donzetta Matters I am going to ask him to look at the third toe. This is not going to be salvageable and needs to be amputated. He has a palpable dorsalis pedis pulse on that side I am hopeful he has enough blood flow to heal this. 5/3 the patient had his left third toe amputated. Since then he has been followed with Dr. Donzetta Matters and sees him this afternoon. The area on the right first met head much better than when I last saw this. He has been revascularized on this side 5/17; he says the left third toe amputation site is healed I did not look at this today. The area on his right medial first metatarsal head is a lot better small wound apparently 2 mm in depth with 2 mm undermining. We have been using endoform 6/7; the area on his right medial first metatarsal head is closed. Improved quite markedly after revascularization by Dr. Donzetta Matters of vein and vascular. This was initially a deep wound that probes to bone in the setting of type 2 diabetes. He will need to be carefully followed for recurrence in this area. Imaging studies were inconclusive with regards to osteomyelitis in this area however the rapid improvement with revascularization suggest that ischemia was the  major issue Electronic Signature(s) Signed: 08/24/2020 5:45:41 PM By: Linton Ham MD Entered By: Linton Ham on 08/24/2020 11:28:11 -------------------------------------------------------------------------------- Physical Exam Details Patient Name: Date of Service: [name] 08/24/2020 10:15 A M Medical Record Number: [id number] Patient Account Number: [id number] Date of Birth/Sex: Treating RN: April 14, 1925 (87 y.o. [name] Primary Care Provider: [name] Other Clinician: Referring Provider: Treating Provider/Extender: Greta Doom Weeks in Treatment: 15 Constitutional Sitting or standing Blood Pressure is within target range for patient.. Pulse regular and within target range for patient.[name] Respirations regular, non-labored and within target range.. Temperature is normal and within the target range for the patient.[name] Appears in no distress. Cardiovascular He has pedal pulses on the right foot which is an improvement since his revascularization. Notes Wound exam; the area on the right medial first metatarsal head has closed. No need for debridement. There is no evidence of infection Electronic Signature(s)  Signed: 08/24/2020 5:45:41 PM By: Linton Ham MD Entered By: Linton Ham on 08/24/2020 11:24:07 -------------------------------------------------------------------------------- Physician Orders Details Patient Name: Date of Service: [name] 08/24/2020 10:15 A M Medical Record Number: [id number] Patient Account Number: [id number] Date of Birth/Sex: Treating RN: 21-Feb-1926 (87 y.o. [name] Primary Care Provider: [name] Other Clinician: Referring Provider: Treating Provider/Extender: Greta Doom Weeks in Treatment: 15 Verbal / Phone Orders: No Diagnosis Coding Follow-up Appointments Other: - No need to follow up!!:) Call us if you have any future concerns!!:) Discharge From [facility]  Services Discharge from [place] [facility] for wound care. Wound Treatment Electronic Signature(s) Signed: 08/24/2020 5:27:47 PM By: Rhae Hammock RN Signed: 08/24/2020 5:45:41 PM By: Linton Ham MD Entered By: Rhae Hammock on 08/24/2020 10:57:12 -------------------------------------------------------------------------------- Problem List Details Patient Name: Date of Service: [name] 08/24/2020 10:15 A M Medical Record Number: [id number] Patient Account Number: [id number] Date of Birth/Sex: Treating RN: February 03, 1926 (87 y.o. [name], [name] Primary Care Provider: [name] Other Clinician: Referring Provider: Treating Provider/Extender: Greta Doom Weeks in Treatment: 15 Active Problems ICD-10 Encounter Code Description Active Date MDM Diagnosis E11.621 Type 2 diabetes mellitus with foot ulcer 05/10/2020 No Yes E11.51 Type 2 diabetes mellitus with diabetic peripheral angiopathy without gangrene 05/10/2020 No Yes L97.514 Non-pressure chronic ulcer of other part of right foot with necrosis of bone 05/10/2020 No Yes L97.518 Non-pressure chronic ulcer of other part of right foot with other specified 05/17/2020 No Yes severity Inactive Problems ICD-10 Code Description Active Date Inactive Date L97.524 Non-pressure chronic ulcer of other part of left foot with necrosis of bone 06/07/2020 06/07/2020 Resolved Problems ICD-10 Code Description Active Date Resolved Date S80.211D Abrasion, right knee, subsequent encounter 05/10/2020 05/10/2020 Electronic Signature(s) Signed: 08/24/2020 5:45:41 PM By: Linton Ham MD Entered By: Linton Ham on 08/24/2020 11:20:40 -------------------------------------------------------------------------------- Progress Note Details Patient Name: Date of Service: [name] 08/24/2020 10:15 A M Medical Record Number: [id number] Patient Account Number: [id number] Date of  Birth/Sex: Treating RN: [birth year] (87 y.o. [name] Primary Care Provider: [name] Other Clinician: Referring Provider: Treating Provider/Extender: Greta Doom Weeks in Treatment: 15 Subjective History of Present Illness (HPI) ADMISSION 05/10/2020 This is a independent 87 year old man who has had an area on his right medial first metatarsal head according to him for roughly 3 months. This started as a skin tear that scab he removed the scab and inside an area on this since. He is a type II diabetic also had an area on his left second toe and right first metatarsal head in November so this wound may have been present longer than not. He has undergone extensive arterial review including a stent in the right popliteal laser atherectomy of the right popliteal and anterior tibial arteries on 01/19/2020. On 02/23/2020 he had a laser atherectomy of the left peroneal angioplasty of the left peroneal and amputation of the left second toe. Follow-up noninvasive studies on 03/05/2020 showed an ABI on the right of 0.75 with a TBI of 0.39. On the left his ABI was 0.77, his left great toe was amputated. He is not experiencing any pain. ALSO he had a right knee skin tear within the last week or 2. He is not really dressing either 1 of these areas Past medical; pacemaker, type 2 diabetes with PAD, osteomyelitis of the left first toe amputated on 09/29/2019, hypertension hypercholesterolemia, COPD 2/28; this is a 87 year old man  who arrives I believe with his son. He is a type II diabetic. He has a wound on the medial part of the first metatarsal head. Culture I did last time showed acid Enterobacter as well as rare Enterococcus. Will be giving him Augmentin to cover both of these for 10 days. His x-ray did not show osteomyelitis although they did comment on possible cortical erosion along the medial aspect of the head of the first metatarsal. He will definitely need an MRI. He comes  into the clinic today with a new wound on the left third toe dorsal aspect over the PIP this also has exposed bone 05/25/2020 upon evaluation today patient appears to be doing well with regard to his wounds currently. Both are fairly clean. He is having a CT scan of his right foot on Friday. With that being said the toe on the left foot appears to be doing okay this is not too deep. There does not appear to be any signs of active infection at this time. No fevers, chills, nausea, vomiting, or diarrhea. 3/17; this is a patient with a wound on the medial aspect of his right first MTP. This is in the setting of severe PAD with type 2 diabetes. He has previously been revascularized on the side on 02/23/2020 however his post procedure noninvasive studies showed an ABI of 0.75 and a TBI of 0.39. He was supposed to have follow-up noninvasive studies today but because he was here and we were late he missed them but were going to try to book him sometime this week. Patient could not have an MRI of the right foot because of a pacemaker. He did have a CT scan that showed marginal erosions along the medial aspect of the first metatarsal head which may reflect osteomyelitis. The radiologist also queries underlying arthropathy such as gout. He came in 2 weeks ago with a new wound on his left third toe. This was x-rayed on 3/16 that did not show obvious osteomyelitis by plain 3/21 semi the patient had his arterial studies on 3/18. These showed an ABI on the right of 0.69 with a TBI of 0.35 and monophasic waveforms at the dorsalis pedis, biphasic at the posterior tibial. On the left he had an ABI of 0.85, great toe amputation monophasic waveforms diffusely. He has an area on the medial part of his right first metatarsal head which probes to bone suspicion of underlying osteomyelitis. He has a wound on the tip of his left third toe which also has exposed bone. He is been reviewed by Dr. Donzetta Matters on 06/04/2020. He is going to  have a right lower extremity angiogram using CO2 1 week from today. 4/1; the patient went to the Cath Lab by Dr. Donzetta Matters of vein and vascular. He underwent laser atherectomy of the right anterior tibial artery and a balloon angioplasty of the right anterior tibial artery. He has wounds on the medial right first metatarsal head and his left third toe. The left third toe was not looking at all healthy. Exposed bone in fact I think part of the joint was protruding today. This is not going to be salvageable. 4/8; patient has a follow-up with Dr. Donzetta Matters I am going to ask him to look at the third toe. This is not going to be salvageable and needs to be amputated. He has a palpable dorsalis pedis pulse on that side I am hopeful he has enough blood flow to heal this. 5/3 the patient had his left third toe  amputated. Since then he has been followed with Dr. Donzetta Matters and sees him this afternoon. The area on the right first met head much better than when I last saw this. He has been revascularized on this side 5/17; he says the left third toe amputation site is healed I did not look at this today. The area on his right medial first metatarsal head is a lot better small wound apparently 2 mm in depth with 2 mm undermining. We have been using endoform 6/7; the area on his right medial first metatarsal head is closed. Improved quite markedly after revascularization by Dr. Donzetta Matters of vein and vascular Objective Constitutional Sitting or standing Blood Pressure is within target range for patient.. Pulse regular and within target range for patient.[name] Respirations regular, non-labored and within target range.. Temperature is normal and within the target range for the patient.[name] Appears in no distress. Vitals Time Taken: 10:35 AM, Height: 73 in, Weight: 217 lbs, BMI: 28.6, Temperature: 97.8 F, Pulse: 92 bpm, Respiratory Rate: 16 breaths/min, Blood Pressure: 109/65 mmHg, Capillary Blood Glucose: 113 mg/dl. Cardiovascular He has  pedal pulses on the right foot which is an improvement since his revascularization. General Notes: Wound exam; the area on the right medial first metatarsal head has closed. No need for debridement. There is no evidence of infection Integumentary (Hair, Skin) Wound #1 status is Open. Original cause of wound was Blister. The date acquired was: 02/18/2020. The wound has been in treatment 15 weeks. The wound is located on the Right,Medial Foot. The wound measures 0.1cm length x 0.1cm width x 0.1cm depth; 0.008cm^2 area and 0.001cm^3 volume. There is Fat Layer (Subcutaneous Tissue) exposed. There is no tunneling or undermining noted. There is a none present amount of drainage noted. The wound margin is well defined and not attached to the wound base. There is no granulation within the wound bed. There is no necrotic tissue within the wound bed. Assessment Active Problems ICD-10 Type 2 diabetes mellitus with foot ulcer Type 2 diabetes mellitus with diabetic peripheral angiopathy without gangrene Non-pressure chronic ulcer of other part of right foot with necrosis of bone Non-pressure chronic ulcer of other part of right foot with other specified severity Plan Follow-up Appointments: Other: - No need to follow up!!:) Call us if you have any future concerns!!:) Discharge From [facility] Services: Discharge from [place]: [place] [facility] for wound care. 1. I have advised him to keep the area on the right medial first metatarsal head padded with a thick Band-Aid or foam cover for the next month at least. 2. Shoes with forefoot width would be acceptable. 3. He has been revascularized on the right. He developed an area on the left third toe that rapidly became necrotic that has been amputated by Dr. Donzetta Matters as well 4. He can be discharged from the clinic at this point. He will call us as needed Electronic Signature(s) Signed: 08/24/2020 5:45:41 PM By: Linton Ham MD Entered By:  Linton Ham on 08/24/2020 11:25:20 -------------------------------------------------------------------------------- SuperBill Details Patient Name: Date of Service: [name] 08/24/2020 Medical Record Number: [id number] Patient Account Number: [id number] Date of Birth/Sex: Treating RN: [birth year] (87 y.o. [name] Primary Care Provider: [name] Other Clinician: Referring Provider: Treating Provider/Extender: Greta Doom Weeks in Treatment: 15 Diagnosis Coding ICD-10 Codes Code Description E11.621 Type 2 diabetes mellitus with foot ulcer E11.51 Type 2 diabetes mellitus with diabetic peripheral angiopathy without gangrene L97.514 Non-pressure chronic ulcer of other part of  right foot with necrosis of bone L97.518 Non-pressure chronic ulcer of other part of right foot with other specified severity Facility Procedures CPT4 Code: [id number] Description: 99213 - WOUND CARE VISIT-LEV 3 EST PT Modifier: Quantity: 1 Physician Procedures : CPT4 Code Description Modifier [id number] - WC PHYS LEVEL 2 - EST PT ICD-10 Diagnosis Description L97.514 Non-pressure chronic ulcer of other part of right foot with necrosis of bone E11.621 Type 2 diabetes mellitus with foot ulcer Quantity: 1 Electronic Signature(s) Signed: 08/24/2020 5:45:41 PM By: Linton Ham MD Entered By: Linton Ham on 08/24/2020 11:25:50

## 2020-08-24 NOTE — Progress Notes (Signed)
OAKES, MCCREADY ([id number]) Visit Report for 08/24/2020 Arrival Information Details Patient Name: Date of Service: [name] 08/24/2020 10:15 A M Medical Record Number: [id number] Patient Account Number: [id number] Date of Birth/Sex: Treating RN: 20-Feb-1926 (87 y.o. [name] Primary Care Deloyce Walthers: [name] Other Clinician: Referring Euna Armon: Treating Jacqualyn Sedgwick/Extender: Greta Doom Weeks in Treatment: 15 Visit Information History Since Last Visit Added or deleted any medications: No Patient Arrived: Kasandra Knudsen Any new allergies or adverse reactions: No Arrival Time: 10:35 Had a fall or experienced change in No Accompanied By: family member activities of daily living that may affect Transfer Assistance: None risk of falls: Patient Identification Verified: Yes Signs or symptoms of abuse/neglect since last visito No Secondary Verification Process Completed: Yes Hospitalized since last visit: No Patient Requires Transmission-Based Precautions: No Implantable device outside of the clinic excluding No Patient Has Alerts: Yes cellular tissue based products placed in the center Patient Alerts: ABI's 12/21: R:0.75 L0.77 since last visit: Has Dressing in Place as Prescribed: Yes Pain Present Now: No Electronic Signature(s) Signed: 08/24/2020 5:39:03 PM By: Deon Pilling Entered By: Deon Pilling on 08/24/2020 10:41:14 -------------------------------------------------------------------------------- Clinic Level of Care Assessment Details Patient Name: Date of Service: [name] 08/24/2020 10:15 A M Medical Record Number: [id number] Patient Account Number: [id number] Date of Birth/Sex: Treating RN: Jan 21, 1926 (88 y.o. [name], [name] Primary Care Leighla Chestnutt: [name] Other Clinician: Referring Megham Dwyer: Treating Melodi Happel/Extender: Greta Doom Weeks in Treatment: 15 Clinic Level of Care Assessment Items TOOL 4 Quantity Score X-  1 0 Use when only an EandM is performed on FOLLOW-UP visit ASSESSMENTS - Nursing Assessment / Reassessment X- 1 10 Reassessment of Co-morbidities (includes updates in patient status) X- 1 5 Reassessment of Adherence to Treatment Plan ASSESSMENTS - Wound and Skin A ssessment / Reassessment X - Simple Wound Assessment / Reassessment - one wound 1 5 []  - 0 Complex Wound Assessment / Reassessment - multiple wounds X- 1 10 Dermatologic / Skin Assessment (not related to wound area) ASSESSMENTS - Focused Assessment []  - 0 Circumferential Edema Measurements - multi extremities []  - 0 Nutritional Assessment / Counseling / Intervention []  - 0 Lower Extremity Assessment (monofilament, tuning fork, pulses) []  - 0 Peripheral Arterial Disease Assessment (using hand held doppler) ASSESSMENTS - Ostomy and/or Continence Assessment and Care []  - 0 Incontinence Assessment and Management []  - 0 Ostomy Care Assessment and Management (repouching, etc.) PROCESS - Coordination of Care X - Simple Patient / Family Education for ongoing care 1 15 []  - 0 Complex (extensive) Patient / Family Education for ongoing care X- 1 10 Staff obtains Programmer, systems, Records, T Results / Process Orders est X- 1 10 Staff telephones HHA, Nursing Homes / Clarify orders / etc []  - 0 Routine Transfer to another Facility (non-emergent condition) []  - 0 Routine Hospital Admission (non-emergent condition) []  - 0 New Admissions / Biomedical engineer / Ordering NPWT Apligraf, etc. , []  - 0 Emergency Hospital Admission (emergent condition) X- 1 10 Simple Discharge Coordination []  - 0 Complex (extensive) Discharge Coordination PROCESS - Special Needs []  - 0 Pediatric / Minor Patient Management []  - 0 Isolation Patient Management []  - 0 Hearing / Language / Visual special needs []  - 0 Assessment of Community assistance (transportation, D/C planning, etc.) []  - 0 Additional assistance / Altered mentation []  -  0 Support Surface(s) Assessment (bed, cushion, seat, etc.) INTERVENTIONS - Wound Cleansing / Measurement X - Simple Wound Cleansing - one wound 1 5 []  -  0 Complex Wound Cleansing - multiple wounds X- 1 5 Wound Imaging (photographs - any number of wounds) []  - 0 Wound Tracing (instead of photographs) X- 1 5 Simple Wound Measurement - one wound []  - 0 Complex Wound Measurement - multiple wounds INTERVENTIONS - Wound Dressings X - Small Wound Dressing one or multiple wounds 1 10 []  - 0 Medium Wound Dressing one or multiple wounds []  - 0 Large Wound Dressing one or multiple wounds []  - 0 Application of Medications - topical []  - 0 Application of Medications - injection INTERVENTIONS - Miscellaneous []  - 0 External ear exam []  - 0 Specimen Collection (cultures, biopsies, blood, body fluids, etc.) []  - 0 Specimen(s) / Culture(s) sent or taken to Lab for analysis []  - 0 Patient Transfer (multiple staff / Civil Service fast streamer / Similar devices) []  - 0 Simple Staple / Suture removal (25 or less) []  - 0 Complex Staple / Suture removal (26 or more) []  - 0 Hypo / Hyperglycemic Management (close monitor of Blood Glucose) []  - 0 Ankle / Brachial Index (ABI) - do not check if billed separately X- 1 5 Vital Signs Has the patient been seen at the hospital within the last three years: Yes Total Score: 105 Level Of Care: New/Established - Level 3 Electronic Signature(s) Signed: 08/24/2020 5:27:47 PM By: Rhae Hammock RN Entered By: Rhae Hammock on 08/24/2020 11:01:19 -------------------------------------------------------------------------------- Lower Extremity Assessment Details Patient Name: Date of Service: [name] 08/24/2020 10:15 A M Medical Record Number: [id number] Patient Account Number: [id number] Date of Birth/Sex: Treating RN: 1926/03/16 (87 y.o. [name] Primary Care Eleonore Shippee: [name] Other Clinician: Referring Almir Botts: Treating Baylin Gamblin/Extender:  Greta Doom Weeks in Treatment: 15 Edema Assessment Assessed: Shirlyn Goltz: No] Patrice Paradise: Yes] Edema: [Left: N] [Right: o] Calf Left: Right: Point of Measurement: 42 cm From Medial Instep 36 cm Ankle Left: Right: Point of Measurement: 23 cm From Medial Instep 22 cm Electronic Signature(s) Signed: 08/24/2020 5:39:03 PM By: Deon Pilling Entered By: Deon Pilling on 08/24/2020 10:42:11 -------------------------------------------------------------------------------- Multi Wound Chart Details Patient Name: Date of Service: [name] 08/24/2020 10:15 A M Medical Record Number: [id number] Patient Account Number: [id number] Date of Birth/Sex: Treating RN: 01-02-1926 (87 y.o. [name], [name] Primary Care Wai Litt: [name] Other Clinician: Referring Shaily Librizzi: Treating Rockne Dearinger/Extender: Greta Doom Weeks in Treatment: 15 Vital Signs Height(in): 73 Capillary Blood Glucose(mg/dl): 113 Weight(lbs): 217 Pulse(bpm): 92 Body Mass Index(BMI): 29 Blood Pressure(mmHg): 109/65 Temperature(F): 97.8 Respiratory Rate(breaths/min): 16 Photos: [1:No Photos Right, Medial Foot] [N/A:N/A N/A] Wound Location: [1:Blister] [N/A:N/A] Wounding Event: [1:Diabetic Wound/Ulcer of the Lower] [N/A:N/A] Primary Etiology: [1:Extremity Coronary Artery Disease,] [N/A:N/A] Comorbid History: [1:Hypertension, Peripheral Arterial Disease, Type II Diabetes, Neuropathy 02/18/2020] [N/A:N/A] Date Acquired: [1:15] [N/A:N/A] Weeks of Treatment: [1:Open] [N/A:N/A] Wound Status: [1:0.1x0.1x0.1] [N/A:N/A] Measurements L x W x D (cm) [1:0.008] [N/A:N/A] A (cm) : rea [1:0.001] [N/A:N/A] Volume (cm) : [1:91.50%] [N/A:N/A] % Reduction in A rea: [1:98.70%] [N/A:N/A] % Reduction in Volume: [1:Grade 2] [N/A:N/A] Classification: [1:None Present] [N/A:N/A] Exudate A mount: [1:Well defined, not attached] [N/A:N/A] Wound Margin: [1:None Present (0%)] [N/A:N/A] Granulation A mount: [1:None  Present (0%)] [N/A:N/A] Necrotic A mount: [1:Fat Layer (Subcutaneous Tissue): Yes N/A] Exposed Structures: [1:Fascia: No Tendon: No Muscle: No Joint: No Bone: No Large (67-100%)] [N/A:N/A] Treatment Notes Electronic Signature(s) Signed: 08/24/2020 5:27:47 PM By: Rhae Hammock RN Signed: 08/24/2020 5:45:41 PM By: Linton Ham MD Entered By: Linton Ham on 08/24/2020 11:20:53 -------------------------------------------------------------------------------- [place] Details Patient Name: Date  of Service: [name] 08/24/2020 10:15 A M Medical Record Number: [id number] Patient Account Number: [id number] Date of Birth/Sex: Treating RN: Aug 29, 1925 (87 y.o. [name] Primary Care Vincient Vanaman: [name] Other Clinician: Referring Delsin Copen: Treating Caria Transue/Extender: Greta Doom Weeks in Treatment: 15 Multidisciplinary Care Plan reviewed with physician Active Inactive Electronic Signature(s) Signed: 08/24/2020 5:27:47 PM By: Rhae Hammock RN Entered By: Rhae Hammock on 08/24/2020 10:59:40 -------------------------------------------------------------------------------- Pain Assessment Details Patient Name: Date of Service: [name] 08/24/2020 10:15 A M Medical Record Number: [id number] Patient Account Number: [id number] Date of Birth/Sex: Treating RN: 19-Aug-1925 (87 y.o. [name] Primary Care Lumen Brinlee: [name] Other Clinician: Referring Hallie Ishida: Treating Matix Henshaw/Extender: Greta Doom Weeks in Treatment: 15 Active Problems Location of Pain Severity and Description of Pain Patient Has Paino No Site Locations Rate the pain. Current Pain Level: 0 Pain Management and Medication Current Pain Management: Medication: No Cold Application: No Rest: No Massage: No Activity: No T.E.N.S.: No Heat Application: No Leg drop or elevation: No Is the Current Pain Management Adequate:  Adequate How does your wound impact your activities of daily livingo Sleep: No Bathing: No Appetite: No Relationship With Others: No Bladder Continence: No Emotions: No Bowel Continence: No Work: No Toileting: No Drive: No Dressing: No Hobbies: No Electronic Signature(s) Signed: 08/24/2020 5:39:03 PM By: Deon Pilling Entered By: Deon Pilling on 08/24/2020 10:41:47 -------------------------------------------------------------------------------- Patient/Caregiver Education Details Patient Name: Date of Service: [name] 6/7/2022andnbsp10:15 A M Medical Record Number: [id number] Patient Account Number: [id number] Date of Birth/Gender: Treating RN: [birth year] (87 y.o. [name] Primary Care Physician: [name] Other Clinician: Referring Physician: Treating Physician/Extender: Samul Dada in Treatment: 15 Education Assessment Education Provided To: Patient Education Topics Provided Wound/Skin Impairment: Methods: Explain/Verbal Responses: State content correctly Electronic Signature(s) Signed: 08/24/2020 5:27:47 PM By: Rhae Hammock RN Entered By: Rhae Hammock on 08/24/2020 11:00:48 -------------------------------------------------------------------------------- Wound Assessment Details Patient Name: Date of Service: [name] 08/24/2020 10:15 A M Medical Record Number: [id number] Patient Account Number: [id number] Date of Birth/Sex: Treating RN: [birth year] (87 y.o. [name] Primary Care Erie Sica: [name] Other Clinician: Referring Audris Speaker: Treating Envi Eagleson/Extender: Greta Doom Weeks in Treatment: 15 Wound Status Wound Number: 1 Primary Diabetic Wound/Ulcer of the Lower Extremity Etiology: Wound Location: Right, Medial Foot Wound Not Healed Wounding Event: Blister Status: Date Acquired: 02/18/2020 Comorbid Coronary Artery Disease, Hypertension, Peripheral Arterial Weeks  Of Treatment: 15 History: Disease, Type II Diabetes, Neuropathy Clustered Wound: No Photos Wound Measurements Length: (cm) 0.1 Width: (cm) 0.1 Depth: (cm) 0.1 Area: (cm) 0.008 Volume: (cm) 0.001 % Reduction in Area: 91.5% % Reduction in Volume: 98.7% Epithelialization: Large (67-100%) Tunneling: No Undermining: No Wound Description Classification: Grade 2 Wound Margin: Well defined, not attached Exudate Amount: None Present Foul Odor After Cleansing: No Slough/Fibrino No Wound Bed Granulation Amount: None Present (0%) Exposed Structure Necrotic Amount: None Present (0%) Fascia Exposed: No Fat Layer (Subcutaneous Tissue) Exposed: Yes Tendon Exposed: No Muscle Exposed: No Joint Exposed: No Bone Exposed: No Electronic Signature(s) Signed: 08/24/2020 5:03:12 PM By: Sandre Kitty Signed: 08/24/2020 5:39:03 PM By: Deon Pilling Entered By: Sandre Kitty on 08/24/2020 16:38:06 -------------------------------------------------------------------------------- [place] Details Patient Name: Date of Service: HA [name] 08/24/2020 10:15 A M Medical Record Number: [id number] Patient Account Number: [id number] Date of Birth/Sex: Treating RN: January 23, 1926 (87 y.o. [name] Primary Care Thomasenia Dowse: [name] Other Clinician: Referring Svetlana Bagby: Treating Jessey Huyett/Extender: Dellia Nims  Berdine Dance, [name] Weeks in Treatment: 15 Vital Signs Time Taken: 10:35 Temperature (F): 97.8 Height (in): 73 Pulse (bpm): 92 Weight (lbs): 217 Respiratory Rate (breaths/min): 16 Body Mass Index (BMI): 28.6 Blood Pressure (mmHg): 109/65 Capillary Blood Glucose (mg/dl): 113 Reference Range: 80 - 120 mg / dl Electronic Signature(s) Signed: 08/24/2020 5:39:03 PM By: Deon Pilling Entered By: Deon Pilling on 08/24/2020 10:41:38

## 2020-08-25 DIAGNOSIS — L97514 Non-pressure chronic ulcer of other part of right foot with necrosis of bone: Secondary | ICD-10-CM | POA: Diagnosis not present

## 2020-08-25 DIAGNOSIS — I2510 Atherosclerotic heart disease of native coronary artery without angina pectoris: Secondary | ICD-10-CM | POA: Diagnosis not present

## 2020-08-25 DIAGNOSIS — E1151 Type 2 diabetes mellitus with diabetic peripheral angiopathy without gangrene: Secondary | ICD-10-CM | POA: Diagnosis not present

## 2020-08-25 DIAGNOSIS — E11621 Type 2 diabetes mellitus with foot ulcer: Secondary | ICD-10-CM | POA: Diagnosis not present

## 2020-08-27 DIAGNOSIS — E11621 Type 2 diabetes mellitus with foot ulcer: Secondary | ICD-10-CM | POA: Diagnosis not present

## 2020-08-27 DIAGNOSIS — E785 Hyperlipidemia, unspecified: Secondary | ICD-10-CM | POA: Diagnosis not present

## 2020-08-27 DIAGNOSIS — E1140 Type 2 diabetes mellitus with diabetic neuropathy, unspecified: Secondary | ICD-10-CM | POA: Diagnosis not present

## 2020-08-27 DIAGNOSIS — I5030 Unspecified diastolic (congestive) heart failure: Secondary | ICD-10-CM | POA: Diagnosis not present

## 2020-08-27 DIAGNOSIS — L97514 Non-pressure chronic ulcer of other part of right foot with necrosis of bone: Secondary | ICD-10-CM | POA: Diagnosis not present

## 2020-08-27 DIAGNOSIS — I4891 Unspecified atrial fibrillation: Secondary | ICD-10-CM | POA: Diagnosis not present

## 2020-08-27 DIAGNOSIS — I110 Hypertensive heart disease with heart failure: Secondary | ICD-10-CM | POA: Diagnosis not present

## 2020-08-27 DIAGNOSIS — E1151 Type 2 diabetes mellitus with diabetic peripheral angiopathy without gangrene: Secondary | ICD-10-CM | POA: Diagnosis not present

## 2020-08-27 DIAGNOSIS — I2510 Atherosclerotic heart disease of native coronary artery without angina pectoris: Secondary | ICD-10-CM | POA: Diagnosis not present

## 2020-09-02 NOTE — Progress Notes (Signed)
Remote pacemaker transmission.   

## 2020-10-11 DIAGNOSIS — Z299 Encounter for prophylactic measures, unspecified: Secondary | ICD-10-CM | POA: Diagnosis not present

## 2020-10-11 DIAGNOSIS — S98139A Complete traumatic amputation of one unspecified lesser toe, initial encounter: Secondary | ICD-10-CM | POA: Diagnosis not present

## 2020-10-11 DIAGNOSIS — M542 Cervicalgia: Secondary | ICD-10-CM | POA: Diagnosis not present

## 2020-10-11 DIAGNOSIS — E1165 Type 2 diabetes mellitus with hyperglycemia: Secondary | ICD-10-CM | POA: Diagnosis not present

## 2020-10-11 DIAGNOSIS — I10 Essential (primary) hypertension: Secondary | ICD-10-CM | POA: Diagnosis not present

## 2020-10-11 DIAGNOSIS — Z87891 Personal history of nicotine dependence: Secondary | ICD-10-CM | POA: Diagnosis not present

## 2020-11-10 ENCOUNTER — Ambulatory Visit (INDEPENDENT_AMBULATORY_CARE_PROVIDER_SITE_OTHER): Payer: Medicare HMO

## 2020-11-10 DIAGNOSIS — I441 Atrioventricular block, second degree: Secondary | ICD-10-CM | POA: Diagnosis not present

## 2020-11-10 LAB — CUP PACEART REMOTE DEVICE CHECK
Battery Remaining Longevity: 98 mo
Battery Remaining Percentage: 93 %
Battery Voltage: 3.02 V
Brady Statistic AP VP Percent: 4.8 %
Brady Statistic AP VS Percent: 1 %
Brady Statistic AS VP Percent: 93 %
Brady Statistic AS VS Percent: 1 %
Brady Statistic RA Percent Paced: 4.6 %
Brady Statistic RV Percent Paced: 97 %
Date Time Interrogation Session: 20220824020013
Implantable Lead Implant Date: 20211124
Implantable Lead Implant Date: 20211124
Implantable Lead Location: 753859
Implantable Lead Location: 753860
Implantable Pulse Generator Implant Date: 20211124
Lead Channel Impedance Value: 510 Ohm
Lead Channel Impedance Value: 590 Ohm
Lead Channel Pacing Threshold Amplitude: 0.5 V
Lead Channel Pacing Threshold Amplitude: 0.75 V
Lead Channel Pacing Threshold Pulse Width: 0.5 ms
Lead Channel Pacing Threshold Pulse Width: 0.5 ms
Lead Channel Sensing Intrinsic Amplitude: 12 mV
Lead Channel Sensing Intrinsic Amplitude: 4.5 mV
Lead Channel Setting Pacing Amplitude: 2 V
Lead Channel Setting Pacing Amplitude: 2.5 V
Lead Channel Setting Pacing Pulse Width: 0.5 ms
Lead Channel Setting Sensing Sensitivity: 4 mV
Pulse Gen Model: 2272
Pulse Gen Serial Number: 3875505

## 2020-11-25 NOTE — Progress Notes (Signed)
Remote pacemaker transmission.   

## 2020-12-13 DIAGNOSIS — R6889 Other general symptoms and signs: Secondary | ICD-10-CM | POA: Diagnosis not present

## 2020-12-17 DIAGNOSIS — R5383 Other fatigue: Secondary | ICD-10-CM | POA: Diagnosis not present

## 2020-12-17 DIAGNOSIS — G4709 Other insomnia: Secondary | ICD-10-CM | POA: Diagnosis not present

## 2020-12-20 DIAGNOSIS — R6889 Other general symptoms and signs: Secondary | ICD-10-CM | POA: Diagnosis not present

## 2021-01-20 DIAGNOSIS — E1165 Type 2 diabetes mellitus with hyperglycemia: Secondary | ICD-10-CM | POA: Diagnosis not present

## 2021-01-20 DIAGNOSIS — M25551 Pain in right hip: Secondary | ICD-10-CM | POA: Diagnosis not present

## 2021-01-20 DIAGNOSIS — I10 Essential (primary) hypertension: Secondary | ICD-10-CM | POA: Diagnosis not present

## 2021-01-20 DIAGNOSIS — Z6828 Body mass index (BMI) 28.0-28.9, adult: Secondary | ICD-10-CM | POA: Diagnosis not present

## 2021-01-20 DIAGNOSIS — Z299 Encounter for prophylactic measures, unspecified: Secondary | ICD-10-CM | POA: Diagnosis not present

## 2021-02-09 ENCOUNTER — Ambulatory Visit (INDEPENDENT_AMBULATORY_CARE_PROVIDER_SITE_OTHER): Payer: Medicare HMO

## 2021-02-09 DIAGNOSIS — I441 Atrioventricular block, second degree: Secondary | ICD-10-CM

## 2021-02-09 LAB — CUP PACEART REMOTE DEVICE CHECK
Battery Remaining Longevity: 96 mo
Battery Remaining Percentage: 91 %
Battery Voltage: 3.02 V
Brady Statistic AP VP Percent: 5.9 %
Brady Statistic AP VS Percent: 1 %
Brady Statistic AS VP Percent: 92 %
Brady Statistic AS VS Percent: 1 %
Brady Statistic RA Percent Paced: 5.5 %
Brady Statistic RV Percent Paced: 97 %
Date Time Interrogation Session: 20221123020035
Implantable Lead Implant Date: 20211124
Implantable Lead Implant Date: 20211124
Implantable Lead Location: 753859
Implantable Lead Location: 753860
Implantable Pulse Generator Implant Date: 20211124
Lead Channel Impedance Value: 510 Ohm
Lead Channel Impedance Value: 580 Ohm
Lead Channel Pacing Threshold Amplitude: 0.5 V
Lead Channel Pacing Threshold Amplitude: 0.75 V
Lead Channel Pacing Threshold Pulse Width: 0.5 ms
Lead Channel Pacing Threshold Pulse Width: 0.5 ms
Lead Channel Sensing Intrinsic Amplitude: 12 mV
Lead Channel Sensing Intrinsic Amplitude: 4.8 mV
Lead Channel Setting Pacing Amplitude: 2 V
Lead Channel Setting Pacing Amplitude: 2.5 V
Lead Channel Setting Pacing Pulse Width: 0.5 ms
Lead Channel Setting Sensing Sensitivity: 4 mV
Pulse Gen Model: 2272
Pulse Gen Serial Number: 3875505

## 2021-02-21 ENCOUNTER — Encounter: Payer: No Typology Code available for payment source | Admitting: Internal Medicine

## 2021-02-21 NOTE — Progress Notes (Signed)
Remote pacemaker transmission.   

## 2021-02-25 DIAGNOSIS — R6889 Other general symptoms and signs: Secondary | ICD-10-CM | POA: Diagnosis not present

## 2021-03-05 ENCOUNTER — Other Ambulatory Visit: Payer: Self-pay

## 2021-03-05 DIAGNOSIS — I739 Peripheral vascular disease, unspecified: Secondary | ICD-10-CM

## 2021-03-18 ENCOUNTER — Ambulatory Visit (HOSPITAL_COMMUNITY): Payer: Medicare HMO

## 2021-03-18 ENCOUNTER — Encounter: Payer: Self-pay | Admitting: Internal Medicine

## 2021-03-18 ENCOUNTER — Ambulatory Visit: Payer: Medicare HMO | Admitting: Internal Medicine

## 2021-03-18 ENCOUNTER — Other Ambulatory Visit: Payer: Self-pay

## 2021-03-18 ENCOUNTER — Ambulatory Visit: Payer: Medicare HMO

## 2021-03-18 VITALS — BP 124/66 | HR 96 | Ht 73.0 in | Wt 209.8 lb

## 2021-03-18 DIAGNOSIS — I519 Heart disease, unspecified: Secondary | ICD-10-CM

## 2021-03-18 DIAGNOSIS — I480 Paroxysmal atrial fibrillation: Secondary | ICD-10-CM | POA: Diagnosis not present

## 2021-03-18 DIAGNOSIS — I428 Other cardiomyopathies: Secondary | ICD-10-CM | POA: Diagnosis not present

## 2021-03-18 DIAGNOSIS — I442 Atrioventricular block, complete: Secondary | ICD-10-CM

## 2021-03-18 IMAGING — DX DG FOOT 2V*L*
2 series · 2 of 2 positions shown · non-contrast
Comparison: 09/29/2019

CLINICAL DATA: History of left foot osteomyelitis

EXAM:
LEFT FOOT - 2 VIEW

[foot ap]
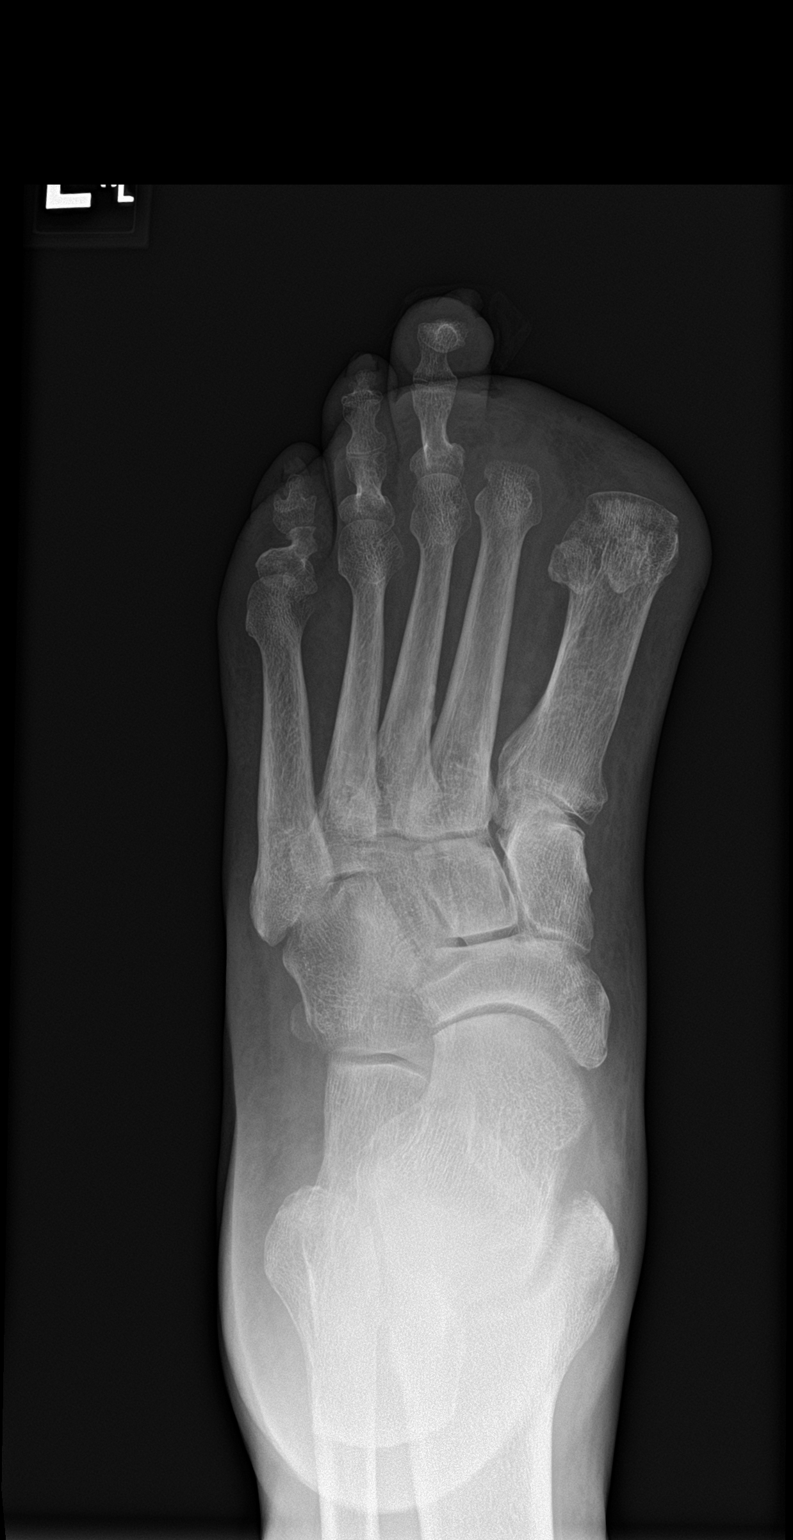

[foot lat]
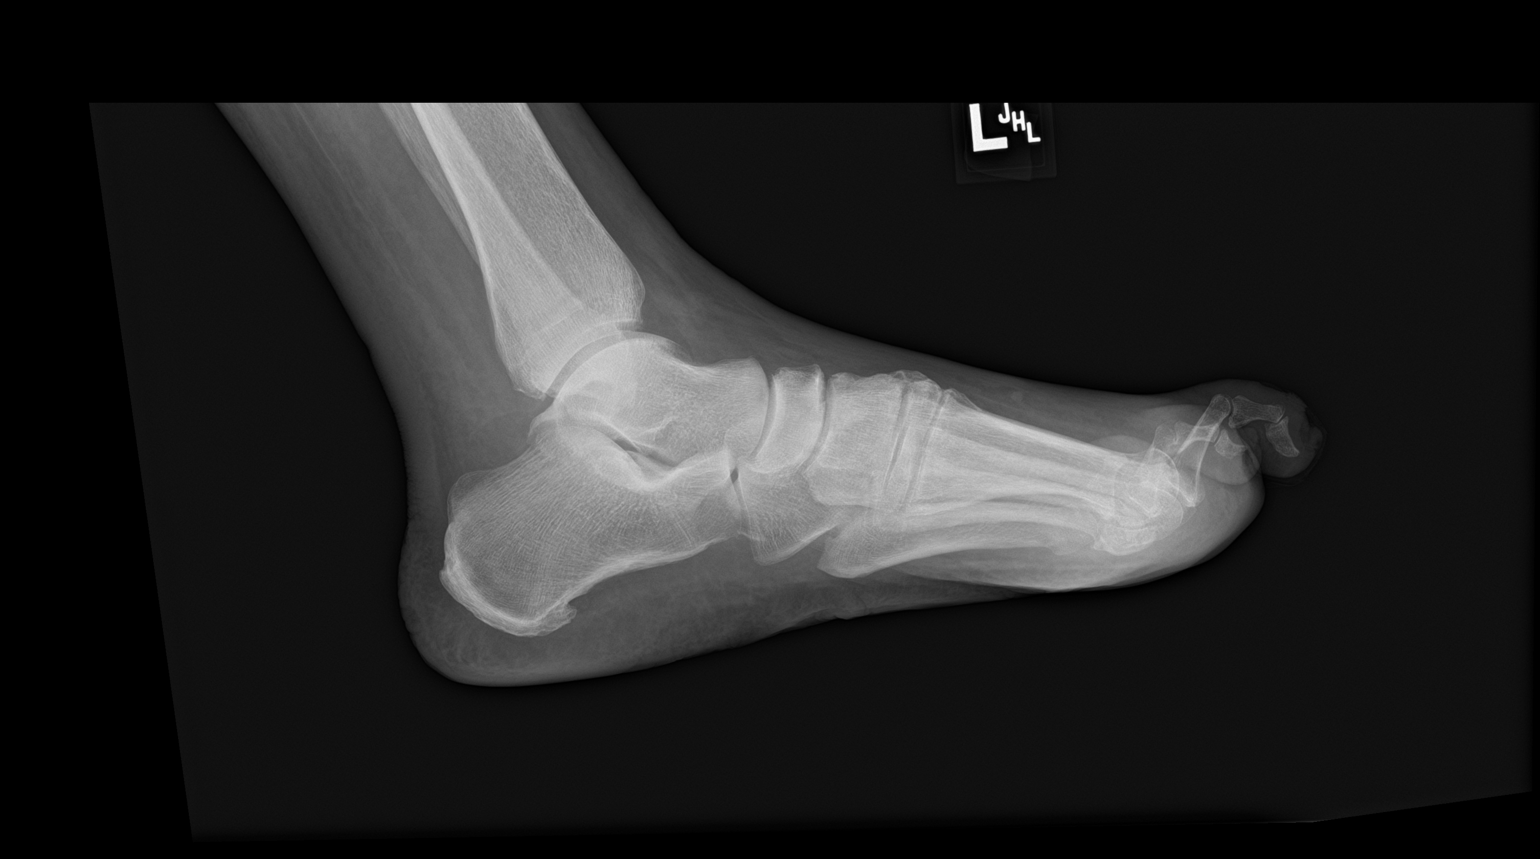

[2 of 2 positions shown; findings below may reference images not displayed]

FINDINGS: Interval surgical amputation of the great toe and second toe at the
MTP joints. No definite bony erosion or periostitis. No fracture or
dislocation. Hammertoe deformities of the third through fifth
digits. Soft tissue prominence at the amputation site. No definite
ulceration.
IMPRESSION: Interval amputation of the great toe and second toe at the MTP
joints. No definite radiographic evidence of osteomyelitis.

## 2021-03-18 IMAGING — CT CT FOOT*R* W/O CM
3 of 4 series · 11 of 20 positions shown, 13 images · non-contrast
Comparison: X-ray 05/10/2020

CLINICAL DATA: Diabetic foot wound of the right great toe

EXAM:
CT OF THE RIGHT FOOT WITHOUT CONTRAST
TECHNIQUE: Multidetector CT imaging of the right foot was performed according
to the standard protocol. Multiplanar CT image reconstructions were
also generated.

[Series 5: axial bone · axial · 0.48mm/px · z∈[-689,-557]mm · 5 of 134 slices shown, 7 images]
[im 23/134  soft-tissue]
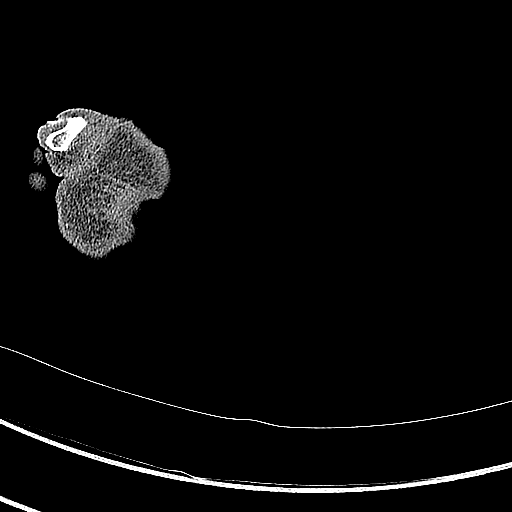
[im 23/134  bone]
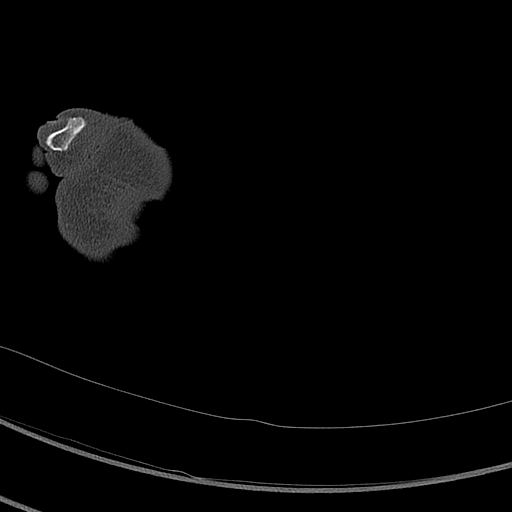
[im 45/134  bone]
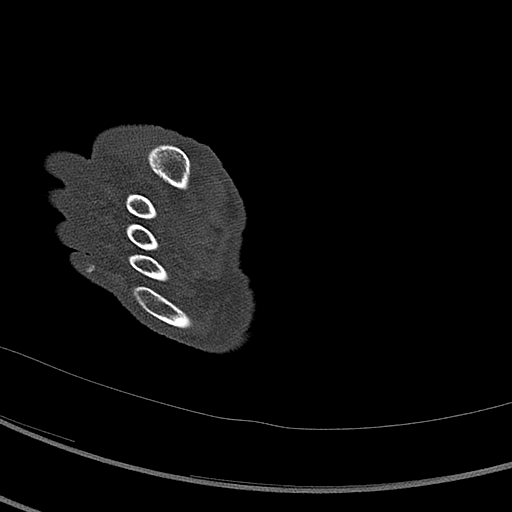
[im 67/134  bone]
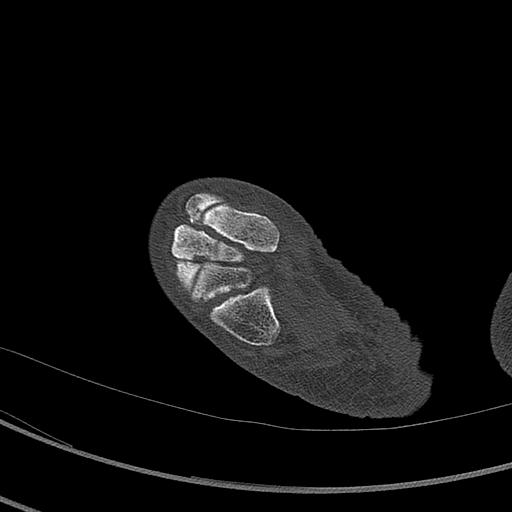
[im 89/134  bone]
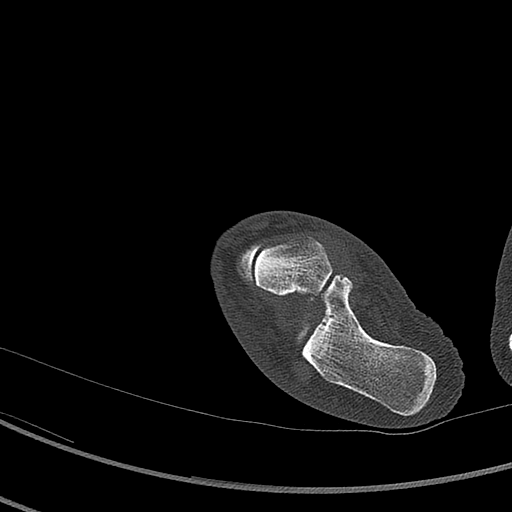
[im 111/134  soft-tissue]
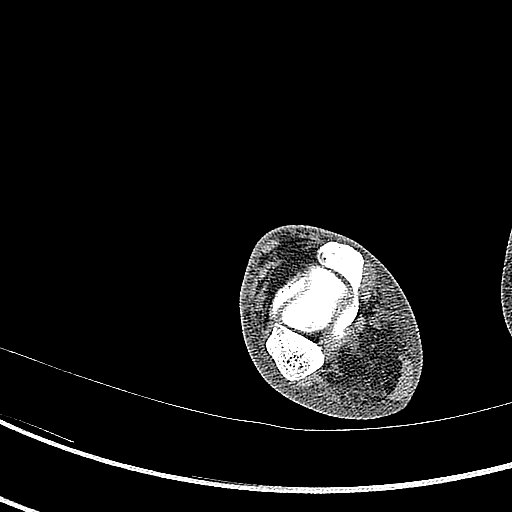
[im 111/134  bone]
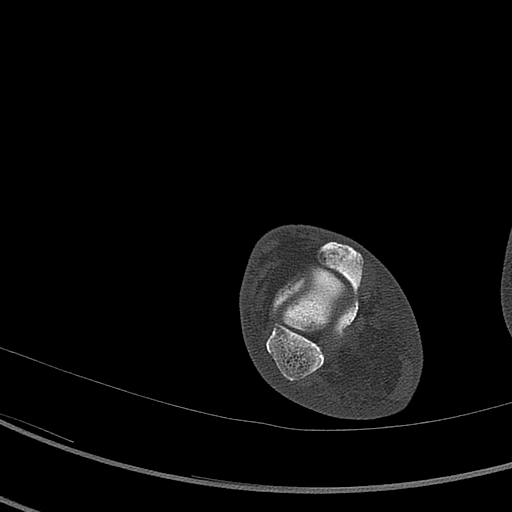

[Series 7: sagittal bone · coronal · 0.39mm/px · 1 of 84 slices shown]
[im 42/84  bone]
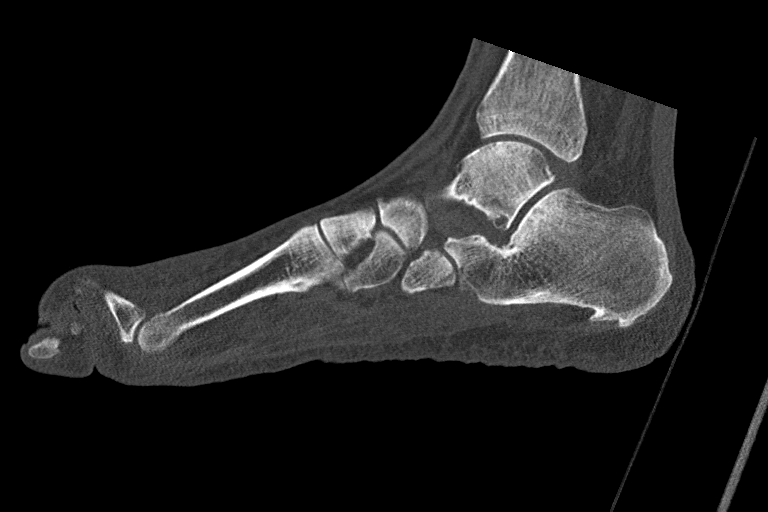

[Series 8: axial st · axial · 0.48mm/px · z∈[-689,-557]mm · 5 of 134 slices shown]
[im 23/134  bone]
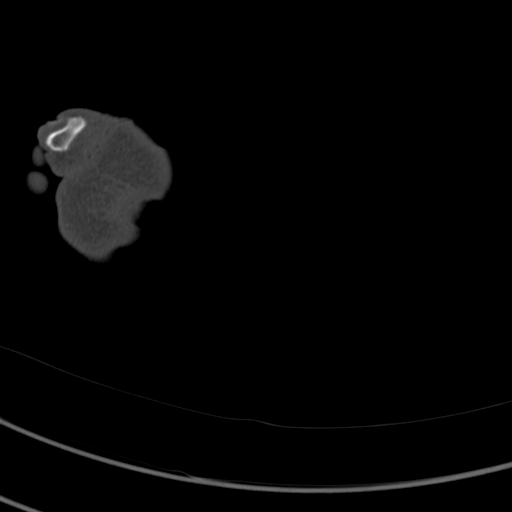
[im 45/134  bone]
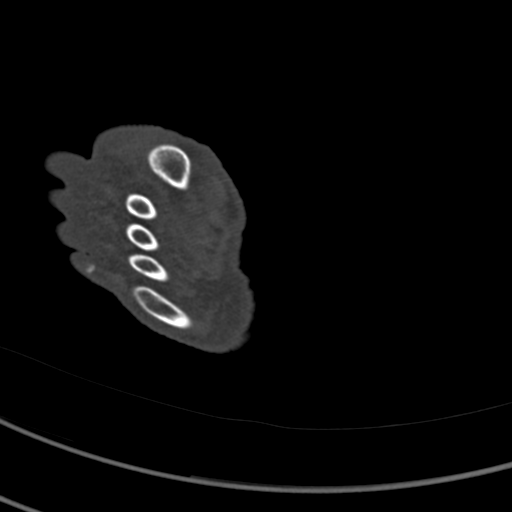
[im 67/134  bone]
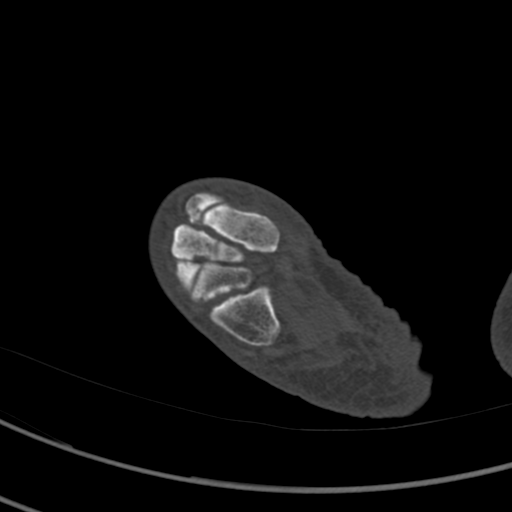
[im 89/134  bone]
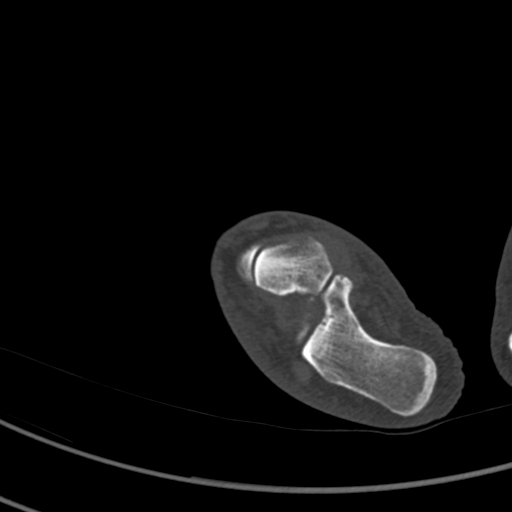
[im 111/134  bone]
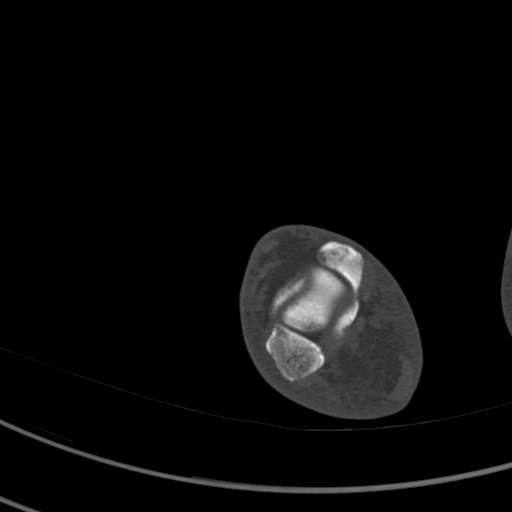

[11 of 20 positions shown; findings below may reference images not displayed]

FINDINGS: Bones/Joint/Cartilage

Marginal erosions again seen along the medial aspect of the first
metatarsal head with superior overhanging margin (series 6, image
55; series 5, images 96-101). No additional sites of focal bone
erosion are identified. Mild hallux valgus deformity. Prominent
valgus deformity at the great toe IP joint. Hammertoe deformities of
the second through fifth toes with second digit crossover deformity.

Mild-moderate first MTP joint osteoarthritis. Chronic healed
fracture deformity of the fifth metatarsal neck. Scattered
arthropathy within the midfoot. Well corticated osseous densities
inferior to the tip of the medial malleolus suggests sequela of
remote trauma.

Ligaments

Suboptimally assessed by CT.

Muscles and Tendons

Diffuse fatty atrophy of the intrinsic foot musculature compatible
with chronic denervation changes. No appreciable tenosynovial fluid
collections.

Soft tissues

Soft tissue thickening overlies the medial aspect of the first
metatarsal head with subtle skin irregularity which could reflect
superficial ulceration. No organized fluid collection.
IMPRESSION: 1. Marginal erosions along the medial aspect of the first metatarsal
head. Findings may reflect osteomyelitis given history of overlying
diabetic ulceration. Appearance could also be seen in the setting of
a crystalline arthropathy such as gout.
2. Soft tissue thickening overlies the medial aspect of the first
metatarsal head with subtle skin irregularity which could reflect
superficial ulceration. No organized fluid collection.
3. Hammertoe deformities of the second through fifth toes with
second digit crossover deformity.
4. Mild-moderate first MTP joint osteoarthritis.
5. Diffuse fatty atrophy of the intrinsic foot musculature
compatible with chronic denervation changes.

## 2021-03-18 NOTE — Patient Instructions (Addendum)
Medication Instructions:  Your physician recommends that you continue on your current medications as directed. Please refer to the Current Medication list given to you today. *If you need a refill on your cardiac medications before your next appointment, please call your pharmacy*  Lab Work: None. If you have labs (blood work) drawn today and your tests are completely normal, you will receive your results only by: [place] (if you have MyChart) OR A paper copy in the mail If you have any lab test that is abnormal or we need to change your treatment, we will call you to review the results.  Testing/Procedures: None.  Follow-Up: At [facility], you and your health needs are our priority.  As part of our continuing mission to provide you with exceptional heart care, we have created designated Provider Care Teams.  These Care Teams include your primary Cardiologist (physician) and Advanced Practice Providers (APPs -  Physician Assistants and Nurse Practitioners) who all work together to provide you with the care you need, when you need it.  Your physician wants you to follow-up in: 6 months with  Thompson Grayer, MD in the [place] office.     You will receive a reminder letter in the mail two months in advance. If you don't receive a letter, please call our office to schedule the follow-up appointment.  Remote monitoring is used to monitor your Pacemaker from home. This monitoring reduces the number of office visits required to check your device to one time per year. It allows [place] to keep an eye on the functioning of your device to ensure it is working properly. You are scheduled for a device check from home on 05/11/21. You may send your transmission at any time that day. If you have a wireless device, the transmission will be sent automatically. After your physician reviews your transmission, you will receive a postcard with your next transmission date.  We recommend signing up for the patient  portal called "MyChart".  Sign up information is provided on this After Visit Summary.  MyChart is used to connect with patients for Virtual Visits (Telemedicine).  Patients are able to view lab/test results, encounter notes, upcoming appointments, etc.  Non-urgent messages can be sent to your provider as well.   To learn more about what you can do with MyChart, go to [email or web address].    Any Other Special Instructions Will Be Listed Below (If Applicable).

## 2021-03-18 NOTE — Progress Notes (Signed)
PCP: [name], MD   Primary EP:  Dr Rosalyn Charters. is a 87 y.o. male who presents today for routine electrophysiology followup.  Since last being seen in our clinic, the patient reports doing very well.  Today, he denies symptoms of palpitations, chest pain, shortness of breath,  lower extremity edema, dizziness, presyncope, or syncope.  The patient is otherwise without complaint today.   Past Medical History:  Diagnosis Date   Acute lower GI bleeding    Recurrent: 2011, 2012   Bladder cancer ([place])    Transitional cell, 2001   Bradycardia 01/20/2020   CAD (coronary atherosclerotic disease)    Nonobstructive, 05/2012   CHF (congestive heart failure) ([name])    Exertional dyspnea    EF 62-13%, grade 1 diastolic dysfunction, 0/8657   HLD (hyperlipidemia)    HTN (hypertension)    Neuropathy    Other insomnia    PAD (peripheral artery disease) ([place])    ABIs, 2009: 0.89 right; 0.99 left   Type 2 diabetes mellitus ([place])    Valvular heart disease    Vertigo    Past Surgical History:  Procedure Laterality Date   ABDOMINAL AORTOGRAM W/LOWER EXTREMITY N/A 01/19/2020   Procedure: ABDOMINAL AORTOGRAM W/ Bilateral LOWER EXTREMITY Runoff;  Surgeon: Waynetta Sandy, MD;  Location: [facility];  Service: Cardiovascular;  Laterality: N/A;   ABDOMINAL AORTOGRAM W/LOWER EXTREMITY N/A 06/14/2020   Procedure: ABDOMINAL AORTOGRAM W/LOWER EXTREMITY;  Surgeon: Waynetta Sandy, MD;  Location: [facility];  Service: Cardiovascular;  Laterality: N/A;   AMPUTATION Left 10/01/2019   Procedure: LEFT GREAT TOE AMPUTATION AT METATARSOPHALANGEAL JOINT;  Surgeon: [name], MD;  Location: [facility];  Service: Orthopedics;  Laterality: Left;   AMPUTATION Left 02/24/2020   Procedure: LEFT SECOND TOE AMPUTATION;  Surgeon: Waynetta Sandy, MD;  Location: [facility];  Service: Vascular;  Laterality: Left;   BIV PACEMAKER INSERTION CRT-P N/A 02/11/2020    Procedure: BIV PACEMAKER INSERTION CRT-P;  Surgeon: Thompson Grayer, MD;  Location: [facility];  Service: Cardiovascular;  Laterality: N/A;   CATARACT EXTRACTION     bi lateral   COLONOSCOPY     HEMORRHOID SURGERY     INGUINAL HERNIA REPAIR     LOWER EXTREMITY ANGIOGRAPHY  02/23/2020   Procedure: Lower Extremity Angiography;  Surgeon: Waynetta Sandy, MD;  Location: [facility];  Service: Cardiovascular;;   PERIPHERAL VASCULAR ATHERECTOMY Left 02/23/2020   Procedure: PERIPHERAL VASCULAR ATHERECTOMY;  Surgeon: Waynetta Sandy, MD;  Location: [facility];  Service: Cardiovascular;  Laterality: Left;   PERIPHERAL VASCULAR BALLOON ANGIOPLASTY  02/23/2020   Procedure: PERIPHERAL VASCULAR BALLOON ANGIOPLASTY;  Surgeon: Waynetta Sandy, MD;  Location: [facility];  Service: Cardiovascular;;  left Peroneal    PERIPHERAL VASCULAR INTERVENTION Right 01/19/2020   Procedure: PERIPHERAL VASCULAR INTERVENTION;  Surgeon: Waynetta Sandy, MD;  Location: [facility];  Service: Cardiovascular;  Laterality: Right;   PERIPHERAL VASCULAR INTERVENTION Right 06/14/2020   Procedure: PERIPHERAL VASCULAR INTERVENTION;  Surgeon: Waynetta Sandy, MD;  Location: [facility];  Service: Cardiovascular;  Laterality: Right;   TRANSMETATARSAL AMPUTATION Left 07/01/2020   Procedure: AMPUTATION OF THIRD TOE ON THE LEFT FOOT;  Surgeon: Waynetta Sandy, MD;  Location: [facility];  Service: Vascular;  Laterality: Left;    ROS- all systems are reviewed and negative except as per HPI above  Current Outpatient Medications  Medication  Sig Dispense Refill   albuterol (VENTOLIN HFA) 108 (90 Base) MCG/ACT inhaler Inhale 2 puffs into the lungs every 6 (six) hours as needed for wheezing or shortness of breath.     allopurinol (ZYLOPRIM) 300 MG tablet Take 300 mg by mouth daily.      Alogliptin Benzoate 12.5 MG TABS Take 6.25 mg by mouth daily.      amLODipine (NORVASC) 10 MG tablet Take 1 tablet (10 mg total) by mouth daily. 90 tablet 3   aspirin EC 81 MG tablet Take 81 mg by mouth daily. Swallow whole.     atorvastatin (LIPITOR) 10 MG tablet Take 1 tablet (10 mg total) by mouth every evening. 30 tablet 6   carvedilol (COREG) 3.125 MG tablet Take 3.125 mg by mouth 2 (two) times daily with a meal.     cholecalciferol (VITAMIN D3) 25 MCG (1000 UNIT) tablet Take 1,000 Units by mouth daily.     nortriptyline (PAMELOR) 10 MG capsule Take 10 mg by mouth at bedtime.      polyethylene glycol (MIRALAX / GLYCOLAX) 17 g packet Take 17 g by mouth daily as needed for mild constipation.     tamsulosin (FLOMAX) 0.4 MG CAPS capsule Take 0.4 mg by mouth daily.      traMADol (ULTRAM) 50 MG tablet Take 1 tablet (50 mg total) by mouth every 6 (six) hours as needed for moderate pain. 20 tablet 0   No current facility-administered medications for this visit.    Physical Exam: Vitals:   03/18/21 0934  BP: 124/66  Pulse: 96  SpO2: 95%  Weight: 209 lb 12.8 oz (95.2 kg)  Height: 6\' 1"  (1.854 m)    GEN- The patient is well appearing, alert and oriented x 3 today.   Head- normocephalic, atraumatic Eyes-  Sclera clear, conjunctiva pink Ears- hearing intact Oropharynx- clear Lungs- Clear to ausculation bilaterally, normal work of breathing Chest- pacemaker pocket is well healed Heart- Regular rate and rhythm, no murmurs, rubs or gallops, PMI not laterally displaced GI- soft, NT, ND, + BS Extremities- no clubbing, cyanosis, or edema  Pacemaker interrogation- reviewed in detail today,  See PACEART report  ekg tracing ordered today is personally reviewed and shows sinus with V pacing  Assessment and Plan:  1. Symptomatic complete heart block Normal pacemaker function See Pace Art report No changes today he is device dependant today  2. Nonischemic CM/ chronic systolic dysfunction Euvolemic No ischemic symptoms  3. Afib Burden is 3.5% by PPM.    He is no longer on plavix for PAD.  We discussed switching to eliquis 2.5mg  BID.  I would like for him to discuss with Dr Woody Seller on his visit in February.  If not contraindicated, hopefully, he can be started on eliquis at that time.  4. PAD Stable Continue ASA As above  Return to see me for further AF discussions in [place] in 6 months  Thompson Grayer MD, [facility] 03/18/2021 9:48 AM

## 2021-04-15 ENCOUNTER — Encounter: Payer: Self-pay | Admitting: Physician Assistant

## 2021-04-15 ENCOUNTER — Other Ambulatory Visit: Payer: Self-pay

## 2021-04-15 ENCOUNTER — Ambulatory Visit (INDEPENDENT_AMBULATORY_CARE_PROVIDER_SITE_OTHER): Payer: Medicare PPO | Admitting: Physician Assistant

## 2021-04-15 ENCOUNTER — Ambulatory Visit (INDEPENDENT_AMBULATORY_CARE_PROVIDER_SITE_OTHER)
Admission: RE | Admit: 2021-04-15 | Discharge: 2021-04-15 | Disposition: A | Discharge status: Home / Self Care | Payer: Medicare PPO | Source: Ambulatory Visit | Attending: Vascular Surgery | Admitting: Vascular Surgery

## 2021-04-15 ENCOUNTER — Ambulatory Visit (HOSPITAL_COMMUNITY)
Admission: RE | Admit: 2021-04-15 | Discharge: 2021-04-15 | Disposition: A | Discharge status: Home / Self Care | Payer: Medicare PPO | Source: Ambulatory Visit | Attending: Vascular Surgery | Admitting: Vascular Surgery

## 2021-04-15 VITALS — BP 132/73 | HR 74 | Temp 98.1°F | Resp 16 | Ht 73.0 in | Wt 208.0 lb

## 2021-04-15 DIAGNOSIS — S91301D Unspecified open wound, right foot, subsequent encounter: Secondary | ICD-10-CM

## 2021-04-15 DIAGNOSIS — I739 Peripheral vascular disease, unspecified: Secondary | ICD-10-CM | POA: Insufficient documentation

## 2021-04-15 NOTE — Progress Notes (Signed)
[facility]   History of Present Illness:  Patient is a 88 y.o. year old male who presents for evaluation of PAD.   He is followed by Dr. Donzetta Matters with history of non healing wound on the right first MT with angiogram intervention of right popliteal stent placement, Laser atherectomy of right popliteal artery and anterior tibial artery with 0.9 Auryon, Plain balloon angioplasty right anterior tibial artery with 3 mm and 2.5 mm balloons on 01/19/20.    He also had left second toe ulcer.  02/23/20 the left LE was intervened on with Laser arthrectomy of left peroneal artery with 0.9 mm Auryon and Plain balloon angioplasty left peroneal artery with 2.5 mm balloon.  He was returned to the OR for left first and  second toe amputation.  Most recently third toe amputation on 07/01/20.  These site have all healed well.    He is here today for f/u exam and surveillance studies.  He states when he pulled his sock off 2 weeks ago he discovered a new heel wound.  He denies fever and chills. No purulent drainage.     He is medically managed on ASA and Statin daily.  He denies rest pain or new non healing wounds.      Past Medical History:  Diagnosis Date   Acute lower GI bleeding    Recurrent: 2011, 2012   Bladder cancer ([place])    Transitional cell, 2001   Bradycardia 01/20/2020   CAD (coronary atherosclerotic disease)    Nonobstructive, 05/2012   CHF (congestive heart failure) ([name])    Exertional dyspnea    EF 12-87%, grade 1 diastolic dysfunction, 10/6765   HLD (hyperlipidemia)    HTN (hypertension)    Neuropathy    Other insomnia    PAD (peripheral artery disease) ([place])    ABIs, 2009: 0.89 right; 0.99 left   Type 2 diabetes mellitus ([place])    Valvular heart disease    Vertigo     Past Surgical History:  Procedure Laterality Date   ABDOMINAL AORTOGRAM W/LOWER EXTREMITY N/A 01/19/2020   Procedure: ABDOMINAL AORTOGRAM W/ Bilateral LOWER EXTREMITY Runoff;  Surgeon: Waynetta Sandy, MD;  Location: [facility];  Service: Cardiovascular;  Laterality: N/A;   ABDOMINAL AORTOGRAM W/LOWER EXTREMITY N/A 06/14/2020   Procedure: ABDOMINAL AORTOGRAM W/LOWER EXTREMITY;  Surgeon: Waynetta Sandy, MD;  Location: [facility];  Service: Cardiovascular;  Laterality: N/A;   AMPUTATION Left 10/01/2019   Procedure: LEFT GREAT TOE AMPUTATION AT METATARSOPHALANGEAL JOINT;  Surgeon: [name], MD;  Location: [facility];  Service: Orthopedics;  Laterality: Left;   AMPUTATION Left 02/24/2020   Procedure: LEFT SECOND TOE AMPUTATION;  Surgeon: Waynetta Sandy, MD;  Location: [facility];  Service: Vascular;  Laterality: Left;   BIV PACEMAKER INSERTION CRT-P N/A 02/11/2020   Procedure: BIV PACEMAKER INSERTION CRT-P;  Surgeon: Thompson Grayer, MD;  Location: [facility];  Service: Cardiovascular;  Laterality: N/A;   CATARACT EXTRACTION     bi lateral   COLONOSCOPY     HEMORRHOID SURGERY     INGUINAL HERNIA REPAIR     LOWER EXTREMITY ANGIOGRAPHY  02/23/2020   Procedure: Lower Extremity Angiography;  Surgeon: Waynetta Sandy, MD;  Location: [facility];  Service: Cardiovascular;;   PERIPHERAL VASCULAR ATHERECTOMY Left 02/23/2020   Procedure: PERIPHERAL VASCULAR ATHERECTOMY;  Surgeon: Waynetta Sandy, MD;  Location: [facility];  Service: Cardiovascular;  Laterality: Left;  PERIPHERAL VASCULAR BALLOON ANGIOPLASTY  02/23/2020   Procedure: PERIPHERAL VASCULAR BALLOON ANGIOPLASTY;  Surgeon: Waynetta Sandy, MD;  Location: [facility];  Service: Cardiovascular;;  left Peroneal    PERIPHERAL VASCULAR INTERVENTION Right 01/19/2020   Procedure: PERIPHERAL VASCULAR INTERVENTION;  Surgeon: Waynetta Sandy, MD;  Location: [facility];  Service: Cardiovascular;  Laterality: Right;   PERIPHERAL VASCULAR INTERVENTION Right 06/14/2020   Procedure: PERIPHERAL VASCULAR INTERVENTION;  Surgeon: Waynetta Sandy, MD;  Location: [facility];  Service: Cardiovascular;  Laterality: Right;   TRANSMETATARSAL AMPUTATION Left 07/01/2020   Procedure: AMPUTATION OF THIRD TOE ON THE LEFT FOOT;  Surgeon: Waynetta Sandy, MD;  Location: [place] [place];  Service: Vascular;  Laterality: Left;    ROS:   General:  No weight loss, Fever, chills  HEENT: No recent headaches, no nasal bleeding, no visual changes, no sore throat  Neurologic: No dizziness, blackouts, seizures. No recent symptoms of stroke or mini- stroke. No recent episodes of slurred speech, or temporary blindness.  Cardiac: No recent episodes of chest pain/pressure, no shortness of breath at rest.  No shortness of breath with exertion.  Denies history of atrial fibrillation or irregular heartbeat  Vascular: No history of rest pain in feet.  No history of claudication.  No history of non-healing ulcer, No history of DVT   Pulmonary: No home oxygen, no productive cough, no hemoptysis,  No asthma or wheezing  Musculoskeletal:  [ ]  Arthritis, [ ]  Low back pain,  [ ]  Joint pain  Hematologic:No history of hypercoagulable state.  No history of easy bleeding.  No history of anemia  Gastrointestinal: No hematochezia or melena,  No gastroesophageal reflux, no trouble swallowing  Urinary: [ ]  chronic Kidney disease, [ ]  on HD - [ ]  MWF or [ ]  TTHS, [ ]  Burning with urination, [ ]  Frequent urination, [ ]  Difficulty urinating;   Skin: No rashes  Psychological: No history of anxiety,  No history of depression  Social History Social History   Tobacco Use   Smoking status: Former    Types: Cigarettes    Quit date: 04/19/1972    Years since quitting: 49.0   Smokeless tobacco: Never  Vaping Use   Vaping Use: Never used  Substance Use Topics   Alcohol use: Never   Drug use: Never    Family History Family History  Problem Relation Age of Onset   Heart attack Mother    Heart attack Father    Heart attack Sister    Heart  attack Sister    Heart attack Sister    Heart attack Sister     Allergies  No Known Allergies   Current Outpatient Medications  Medication Sig Dispense Refill   albuterol (VENTOLIN HFA) 108 (90 Base) MCG/ACT inhaler Inhale 2 puffs into the lungs every 6 (six) hours as needed for wheezing or shortness of breath.     allopurinol (ZYLOPRIM) 300 MG tablet Take 300 mg by mouth daily.      Alogliptin Benzoate 12.5 MG TABS Take 6.25 mg by mouth daily.     amLODipine (NORVASC) 10 MG tablet Take 1 tablet (10 mg total) by mouth daily. 90 tablet 3   aspirin EC 81 MG tablet Take 81 mg by mouth daily. Swallow whole.     atorvastatin (LIPITOR) 10 MG tablet Take 1 tablet (10 mg total) by mouth every evening. 30 tablet 6   carvedilol (COREG) 3.125 MG tablet Take 3.125 mg by mouth 2 (  two) times daily with a meal.     cholecalciferol (VITAMIN D3) 25 MCG (1000 UNIT) tablet Take 1,000 Units by mouth daily.     nortriptyline (PAMELOR) 10 MG capsule Take 10 mg by mouth at bedtime.      polyethylene glycol (MIRALAX / GLYCOLAX) 17 g packet Take 17 g by mouth daily as needed for mild constipation.     tamsulosin (FLOMAX) 0.4 MG CAPS capsule Take 0.4 mg by mouth daily.      traMADol (ULTRAM) 50 MG tablet Take 1 tablet (50 mg total) by mouth every 6 (six) hours as needed for moderate pain. 20 tablet 0   No current facility-administered medications for this visit.    Physical Examination  There were no vitals filed for this visit.  There is no height or weight on file to calculate BMI.  General:  Alert and oriented, no acute distress HEENT: Normal Neck: No bruit or JVD Pulmonary: Clear to auscultation bilaterally Cardiac: Regular Rate and Rhythm without murmur Abdomen: Soft, non-tender, non-distended, no mass, no scars Skin: No rash      Musculoskeletal: No deformity or edema  Neurologic: Upper and lower extremity motor intact and symmetric  DATA:     ABI Findings:   +---------+------------------+-----+----------+--------+   Right     Rt Pressure (mmHg) Index Waveform   Comment    +---------+------------------+-----+----------+--------+   Brachial  115                                            +---------+------------------+-----+----------+--------+   PTA       41                 0.34  monophasic            +---------+------------------+-----+----------+--------+   DP        82                 0.68  monophasic            +---------+------------------+-----+----------+--------+   Great Toe 0                  0.00                        +---------+------------------+-----+----------+--------+   +---------+------------------+-----+--------+----------+   Left      Lt Pressure (mmHg) Index Waveform Comment      +---------+------------------+-----+--------+----------+   Brachial  120                                            +---------+------------------+-----+--------+----------+   PTA       0                  0.00  absent                +---------+------------------+-----+--------+----------+   DP        85                 0.71  biphasic              +---------+------------------+-----+--------+----------+   Great Toe  amputation   +---------+------------------+-----+--------+----------+   +-------+-----------+-----------+------------+------------+   ABI/TBI Today's ABI Today's TBI Previous ABI Previous TBI   +-------+-----------+-----------+------------+------------+   Right   0.68        0.00        0.99         0.41           +-------+-----------+-----------+------------+------------+   Left    0.71        amputation  0.72         amputation     +-------+-----------+-----------+------------+------------+    Right ABIs appear decreased compared to prior study on 06/28/2020. Left  ABIs appear essentially unchanged compared to prior study on 06/28/2020.     Summary:  Right: Resting right ankle-brachial index  indicates moderate right lower  extremity arterial disease. The right toe-brachial index is abnormal.   Left: Resting left ankle-brachial index indicates moderate left lower  extremity arterial disease.   +----------+--------+-----+--------+---------+--------+   RIGHT      PSV cm/s Ratio Stenosis Waveform  Comments   +----------+--------+-----+--------+---------+--------+   CFA Prox   124                     triphasic            +----------+--------+-----+--------+---------+--------+   CFA Distal 340                     biphasic             +----------+--------+-----+--------+---------+--------+   DFA        54                      biphasic             +----------+--------+-----+--------+---------+--------+   SFA Prox   196                     triphasic            +----------+--------+-----+--------+---------+--------+   SFA Mid    169                     triphasic            +----------+--------+-----+--------+---------+--------+   SFA Distal 121                     triphasic            +----------+--------+-----+--------+---------+--------+   POP Prox   43                      triphasic            +----------+--------+-----+--------+---------+--------+   A focal velocity elevation of 340 cm/s was obtained at Distal CFA with a  VR of 2.7. Findings are characteristic of 50-74% stenosis. A 2nd focal  velocity elevation was visualized, measuring 196 cm/s at Proximal SFA with  a VR of 2.3. Findings are  characteristic of 50-74% stenosis.      Right Stent(s):  +---------------+--------+--------+---------+--------+   Popliteal       PSV cm/s Stenosis Waveform  Comments   +---------------+--------+--------+---------+--------+   Prox to Stent   46                triphasic            +---------------+--------+--------+---------+--------+   Proximal Stent  62  triphasic            +---------------+--------+--------+---------+--------+   Mid Stent       127                triphasic            +---------------+--------+--------+---------+--------+   Distal Stent    68                biphasic             +---------------+--------+--------+---------+--------+   Distal to Stent 44                biphasic             +---------------+--------+--------+---------+--------+    Summary:  Right: 50-74% stenosis noted in the common femoral artery. 50-74% stenosis  noted in the superficial femoral artery.    ASSESSMENT/PLAN:  PAD with tissue loss ischemia requiring multiple interventions to include:  non healing wound on the right first MT with angiogram intervention of right popliteal stent placement, Laser atherectomy of right popliteal artery and anterior tibial artery with 0.9 Auryon, Plain balloon angioplasty right anterior tibial artery with 3 mm and 2.5 mm balloons on 01/19/20.    He also had left second toe ulcer.  02/23/20 the left LE was intervened on with Laser arthrectomy of left peroneal artery with 0.9 mm Auryon and Plain balloon angioplasty left peroneal artery with 2.5 mm balloon.  He was returned to the OR for left first and  second toe amputation.  Most recently third toe amputation on 07/01/20.   He has a 60 week old new wound on the right heel.  This is without frank signs of infection.    On arterial duplex he has elevated velocity in the CFA 340 cm/s.  He maintains biphasic and triphasic wave forms.  The sten is patent with min stenosis mid stent and velocity of 127 cm/s.    The ABI's show a drop in index from 0.99 to 0.68  His TBI's are lost.    The left LE ABI are unchanged with 0,71 index.  I discussed his case with Dr. Virl Cagey and he suggested since the wound is new without signs of infection we will schedule him a f/u visit in 2 weeks to see DR. Donzetta Matters.  If the wound fails to heal he may need further intervention to improve his right LE arterial inflow.       Roxy Horseman PA-C [facility] Office:  [phone]  MD in clinic [place]

## 2021-04-22 ENCOUNTER — Other Ambulatory Visit: Payer: Self-pay | Admitting: Internal Medicine

## 2021-04-27 ENCOUNTER — Ambulatory Visit (INDEPENDENT_AMBULATORY_CARE_PROVIDER_SITE_OTHER): Payer: Medicare PPO | Admitting: Vascular Surgery

## 2021-04-27 ENCOUNTER — Encounter: Payer: Self-pay | Admitting: Vascular Surgery

## 2021-04-27 ENCOUNTER — Other Ambulatory Visit: Payer: Self-pay

## 2021-04-27 VITALS — BP 132/77 | HR 85 | Temp 97.9°F | Resp 20 | Ht 73.0 in | Wt 208.0 lb

## 2021-04-27 DIAGNOSIS — I739 Peripheral vascular disease, unspecified: Secondary | ICD-10-CM

## 2021-04-27 DIAGNOSIS — S91301D Unspecified open wound, right foot, subsequent encounter: Secondary | ICD-10-CM | POA: Diagnosis not present

## 2021-04-27 NOTE — Progress Notes (Signed)
Patient ID: Peter James., male   DOB: [birth year], 88 y.o.   MRN: [id number]  Reason for Consult: Follow-up   Referred by Peter Chroman, MD  Subjective:     HPI:  Peter James. is a 88 y.o. male has history of bilateral lower extremity endovascular intervention and toe amputation on the left.  Few weeks ago he developed an ulcer on the right heel this was without any antecedent trauma.  He has not had any fevers or chills.  He thinks that it is somewhat improving although states that he does have pain with walking.  He has not had any other ulceration denies any skin changes of his toes.  He does have some difficulty walking long distances and has requested to have a mailbox placed on his house for this reason.  Past Medical History:  Diagnosis Date   Acute lower GI bleeding    Recurrent: 2011, 2012   Bladder cancer ([place])    Transitional cell, 2001   Bradycardia 01/20/2020   CAD (coronary atherosclerotic disease)    Nonobstructive, 05/2012   CHF (congestive heart failure) ([name])    Exertional dyspnea    EF 76-54%, grade 1 diastolic dysfunction, 08/5033   HLD (hyperlipidemia)    HTN (hypertension)    Neuropathy    Other insomnia    PAD (peripheral artery disease) ([place])    ABIs, 2009: 0.89 right; 0.99 left   Type 2 diabetes mellitus ([name])    Valvular heart disease    Vertigo    Family History  Problem Relation Age of Onset   Heart attack Mother    Heart attack Father    Heart attack Sister    Heart attack Sister    Heart attack Sister    Heart attack Sister    Past Surgical History:  Procedure Laterality Date   ABDOMINAL AORTOGRAM W/LOWER EXTREMITY N/A 01/19/2020   Procedure: ABDOMINAL AORTOGRAM W/ Bilateral LOWER EXTREMITY Runoff;  Surgeon: Waynetta Sandy, MD;  Location: [place] [facility];  Service: Cardiovascular;  Laterality: N/A;   ABDOMINAL AORTOGRAM W/LOWER EXTREMITY N/A 06/14/2020   Procedure: ABDOMINAL AORTOGRAM W/LOWER EXTREMITY;  Surgeon:  Waynetta Sandy, MD;  Location: [place] [facility];  Service: Cardiovascular;  Laterality: N/A;   AMPUTATION Left 10/01/2019   Procedure: LEFT GREAT TOE AMPUTATION AT METATARSOPHALANGEAL JOINT;  Surgeon: [name], MD;  Location: [place];  Service: Orthopedics;  Laterality: Left;   AMPUTATION Left 02/24/2020   Procedure: LEFT SECOND TOE AMPUTATION;  Surgeon: Waynetta Sandy, MD;  Location: [place];  Service: Vascular;  Laterality: Left;   BIV PACEMAKER INSERTION CRT-P N/A 02/11/2020   Procedure: BIV PACEMAKER INSERTION CRT-P;  Surgeon: Thompson Grayer, MD;  Location: [place] [facility];  Service: Cardiovascular;  Laterality: N/A;   CATARACT EXTRACTION     bi lateral   COLONOSCOPY     HEMORRHOID SURGERY     INGUINAL HERNIA REPAIR     LOWER EXTREMITY ANGIOGRAPHY  02/23/2020   Procedure: Lower Extremity Angiography;  Surgeon: Waynetta Sandy, MD;  Location: [place] [facility];  Service: Cardiovascular;;   PERIPHERAL VASCULAR ATHERECTOMY Left 02/23/2020   Procedure: PERIPHERAL VASCULAR ATHERECTOMY;  Surgeon: Waynetta Sandy, MD;  Location: [place] [facility];  Service: Cardiovascular;  Laterality: Left;   PERIPHERAL VASCULAR BALLOON ANGIOPLASTY  02/23/2020   Procedure: PERIPHERAL VASCULAR BALLOON ANGIOPLASTY;  Surgeon: Waynetta Sandy, MD;  Location: [place] [facility];  Service: Cardiovascular;;  left Peroneal  PERIPHERAL VASCULAR INTERVENTION Right 01/19/2020   Procedure: PERIPHERAL VASCULAR INTERVENTION;  Surgeon: Waynetta Sandy, MD;  Location: [place] [facility];  Service: Cardiovascular;  Laterality: Right;   PERIPHERAL VASCULAR INTERVENTION Right 06/14/2020   Procedure: PERIPHERAL VASCULAR INTERVENTION;  Surgeon: Waynetta Sandy, MD;  Location: [place] [facility];  Service: Cardiovascular;  Laterality: Right;   TRANSMETATARSAL AMPUTATION Left 07/01/2020   Procedure: AMPUTATION OF THIRD TOE ON THE LEFT FOOT;  Surgeon: Waynetta Sandy, MD;  Location: [place];  Service: Vascular;  Laterality: Left;    Short Social History:  Social History   Tobacco Use   Smoking status: Former    Types: Cigarettes    Quit date: 04/19/1972    Years since quitting: 49.0   Smokeless tobacco: Never  Substance Use Topics   Alcohol use: Never    No Known Allergies  Current Outpatient Medications  Medication Sig Dispense Refill   albuterol (VENTOLIN HFA) 108 (90 Base) MCG/ACT inhaler Inhale 2 puffs into the lungs every 6 (six) hours as needed for wheezing or shortness of breath.     allopurinol (ZYLOPRIM) 300 MG tablet Take 300 mg by mouth daily.      Alogliptin Benzoate 12.5 MG TABS Take 6.25 mg by mouth daily.     amLODipine (NORVASC) 10 MG tablet Take 1 tablet (10 mg total) by mouth daily. 90 tablet 3   aspirin EC 81 MG tablet Take 81 mg by mouth daily. Swallow whole.     atorvastatin (LIPITOR) 10 MG tablet Take 1 tablet (10 mg total) by mouth every evening. 30 tablet 6   carvedilol (COREG) 3.125 MG tablet Take 3.125 mg by mouth 2 (two) times daily with a meal.     cholecalciferol (VITAMIN D3) 25 MCG (1000 UNIT) tablet Take 1,000 Units by mouth daily.     nortriptyline (PAMELOR) 10 MG capsule Take 10 mg by mouth at bedtime.      polyethylene glycol (MIRALAX / GLYCOLAX) 17 g packet Take 17 g by mouth daily as needed for mild constipation.     tamsulosin (FLOMAX) 0.4 MG CAPS capsule Take 0.4 mg by mouth daily.      traMADol (ULTRAM) 50 MG tablet Take 1 tablet (50 mg total) by mouth every 6 (six) hours as needed for moderate pain. 20 tablet 0   No current facility-administered medications for this visit.    Review of Systems  Constitutional:  Constitutional negative. HENT: HENT negative.  Eyes: Eyes negative.  Cardiovascular: Cardiovascular negative.  GI: Gastrointestinal negative.  Musculoskeletal: Positive for leg pain.  Skin: Positive for wound.  Neurological: Neurological negative. Hematologic:  Hematologic/lymphatic negative.  Psychiatric: Psychiatric negative.       Objective:  Objective   Vitals:   04/27/21 1130  BP: 132/77  Pulse: 85  Resp: 20  Temp: 97.9 F (36.6 C)  SpO2: 95%  Weight: 208 lb (94.3 kg)  Height: 6\' 1"  (1.854 m)   Body mass index is 27.44 kg/m.  Physical Exam HENT:     Head: Normocephalic.     Nose:     Comments: Wearing a mask Eyes:     Pupils: Pupils are equal, round, and reactive to light.  Abdominal:     General: Abdomen is flat.     Palpations: Abdomen is soft.  Musculoskeletal:     Cervical back: Normal range of motion and neck supple.  Skin:    General: Skin is warm.     Capillary Refill: Capillary refill takes less  than 2 seconds.  Neurological:     General: No focal deficit present.     Mental Status: He is alert.  Psychiatric:        Mood and Affect: Mood normal.        Behavior: Behavior normal.   Right heel  Data: Recent ABIs reviewed     Assessment/Plan:     88 year old male with history of bilateral lower extremity endovascular interventions to heal toe ulcerations.  He has known runoff via the anterior tibial artery on the right only this was intervened upon last March and he was able to heal his wound.  He also has underlying chronic kidney disease and CO2 has been used in the past.  Given that the wound now is in the posterior tibial and peroneal distribution and appears to be getting some better I have discussed with the patient and his family continuing with wound care hoping that this does improve.  I will have him follow-up in 2 to 3 weeks were going to try and use bandages that padded either with a corn pad or thick Band-Aid.  Hopefully we can get this wound to heal with daily dressing changes does not need further intervention.  If this does not heal we will need to undergo right lower extremity angiography possible intervention although treating an anterior tibial artery may not improve the healing chances of this  wound.  He will follow-up in 2 to 3 weeks unless the wound progresses in the time which time we will schedule him for angiography.  I have written him a letter to get a mailbox placed on his house so that he does not have to walk to the street.    Waynetta Sandy MD [facility]

## 2021-04-29 ENCOUNTER — Ambulatory Visit: Payer: Medicare PPO

## 2021-05-05 NOTE — Progress Notes (Deleted)
[facility] [place] HISTORY AND PHYSICAL   History of Present Illness:  Patient is a 88 y.o. year old male who presents for evaluation of slow healing left heel wound with history of bilateral LE endovascular intervention followed by left third toe amputation.  He is here today for f/u wound check.    Past Medical History:  Diagnosis Date   Acute lower GI bleeding    Recurrent: 2011, 2012   Bladder cancer ([place])    Transitional cell, 2001   Bradycardia 01/20/2020   CAD (coronary atherosclerotic disease)    Nonobstructive, 05/2012   CHF (congestive heart failure) ([name])    Exertional dyspnea    EF 01-77%, grade 1 diastolic dysfunction, 11/3901   HLD (hyperlipidemia)    HTN (hypertension)    Neuropathy    Other insomnia    PAD (peripheral artery disease) ([place])    ABIs, 2009: 0.89 right; 0.99 left   Type 2 diabetes mellitus ([place])    Valvular heart disease    Vertigo     Past Surgical History:  Procedure Laterality Date   ABDOMINAL AORTOGRAM W/LOWER EXTREMITY N/A 01/19/2020   Procedure: ABDOMINAL AORTOGRAM W/ Bilateral LOWER EXTREMITY Runoff;  Surgeon: Waynetta Sandy, MD;  Location: [place] [facility];  Service: Cardiovascular;  Laterality: N/A;   ABDOMINAL AORTOGRAM W/LOWER EXTREMITY N/A 06/14/2020   Procedure: ABDOMINAL AORTOGRAM W/LOWER EXTREMITY;  Surgeon: Waynetta Sandy, MD;  Location: [place] [facility];  Service: Cardiovascular;  Laterality: N/A;   AMPUTATION Left 10/01/2019   Procedure: LEFT GREAT TOE AMPUTATION AT METATARSOPHALANGEAL JOINT;  Surgeon: [name], MD;  Location: [place];  Service: Orthopedics;  Laterality: Left;   AMPUTATION Left 02/24/2020   Procedure: LEFT SECOND TOE AMPUTATION;  Surgeon: Waynetta Sandy, MD;  Location: [place];  Service: Vascular;  Laterality: Left;   BIV PACEMAKER INSERTION CRT-P N/A 02/11/2020   Procedure: BIV PACEMAKER INSERTION CRT-P;  Surgeon: Thompson Grayer, MD;  Location: [place] [facility];  Service: Cardiovascular;  Laterality: N/A;   CATARACT EXTRACTION     bi lateral   COLONOSCOPY     HEMORRHOID SURGERY     INGUINAL HERNIA REPAIR     LOWER EXTREMITY ANGIOGRAPHY  02/23/2020   Procedure: Lower Extremity Angiography;  Surgeon: Waynetta Sandy, MD;  Location: [place] [facility];  Service: Cardiovascular;;   PERIPHERAL VASCULAR ATHERECTOMY Left 02/23/2020   Procedure: PERIPHERAL VASCULAR ATHERECTOMY;  Surgeon: Waynetta Sandy, MD;  Location: [place] [facility];  Service: Cardiovascular;  Laterality: Left;   PERIPHERAL VASCULAR BALLOON ANGIOPLASTY  02/23/2020   Procedure: PERIPHERAL VASCULAR BALLOON ANGIOPLASTY;  Surgeon: Waynetta Sandy, MD;  Location: [place] [facility];  Service: Cardiovascular;;  left Peroneal    PERIPHERAL VASCULAR INTERVENTION Right 01/19/2020   Procedure: PERIPHERAL VASCULAR INTERVENTION;  Surgeon: Waynetta Sandy, MD;  Location: [place] [facility];  Service: Cardiovascular;  Laterality: Right;   PERIPHERAL VASCULAR INTERVENTION Right 06/14/2020   Procedure: PERIPHERAL VASCULAR INTERVENTION;  Surgeon: Waynetta Sandy, MD;  Location: [place] [facility];  Service: Cardiovascular;  Laterality: Right;   TRANSMETATARSAL AMPUTATION Left 07/01/2020   Procedure: AMPUTATION OF THIRD TOE ON THE LEFT FOOT;  Surgeon: Waynetta Sandy, MD;  Location: [place];  Service: Vascular;  Laterality: Left;    ROS:   General:  No weight loss, Fever, chills  HEENT: No recent headaches, no nasal bleeding, no visual changes, no sore throat  Neurologic: No dizziness, blackouts, seizures. No recent  symptoms of stroke or mini- stroke. No recent episodes of slurred speech, or temporary blindness.  Cardiac: No recent episodes of chest pain/pressure, no shortness of breath at rest.  No shortness of breath with exertion.  Denies history of atrial fibrillation or irregular heartbeat  Vascular: No history of rest pain in feet.  No  history of claudication.  No history of non-healing ulcer, No history of DVT   Pulmonary: No home oxygen, no productive cough, no hemoptysis,  No asthma or wheezing  Musculoskeletal:  [ ]  Arthritis, [ ]  Low back pain,  [ ]  Joint pain  Hematologic:No history of hypercoagulable state.  No history of easy bleeding.  No history of anemia  Gastrointestinal: No hematochezia or melena,  No gastroesophageal reflux, no trouble swallowing  Urinary: [ ]  chronic Kidney disease, [ ]  on HD - [ ]  MWF or [ ]  TTHS, [ ]  Burning with urination, [ ]  Frequent urination, [ ]  Difficulty urinating;   Skin: No rashes  Psychological: No history of anxiety,  No history of depression  Social History Social History   Tobacco Use   Smoking status: Former    Types: Cigarettes    Quit date: 04/19/1972    Years since quitting: 49.0   Smokeless tobacco: Never  Vaping Use   Vaping Use: Never used  Substance Use Topics   Alcohol use: Never   Drug use: Never    Family History Family History  Problem Relation Age of Onset   Heart attack Mother    Heart attack Father    Heart attack Sister    Heart attack Sister    Heart attack Sister    Heart attack Sister     Allergies  No Known Allergies   Current Outpatient Medications  Medication Sig Dispense Refill   albuterol (VENTOLIN HFA) 108 (90 Base) MCG/ACT inhaler Inhale 2 puffs into the lungs every 6 (six) hours as needed for wheezing or shortness of breath.     allopurinol (ZYLOPRIM) 300 MG tablet Take 300 mg by mouth daily.      Alogliptin Benzoate 12.5 MG TABS Take 6.25 mg by mouth daily.     amLODipine (NORVASC) 10 MG tablet Take 1 tablet (10 mg total) by mouth daily. 90 tablet 3   aspirin EC 81 MG tablet Take 81 mg by mouth daily. Swallow whole.     atorvastatin (LIPITOR) 10 MG tablet Take 1 tablet (10 mg total) by mouth every evening. 30 tablet 6   carvedilol (COREG) 3.125 MG tablet Take 3.125 mg by mouth 2 (two) times daily with a meal.      cholecalciferol (VITAMIN D3) 25 MCG (1000 UNIT) tablet Take 1,000 Units by mouth daily.     nortriptyline (PAMELOR) 10 MG capsule Take 10 mg by mouth at bedtime.      polyethylene glycol (MIRALAX / GLYCOLAX) 17 g packet Take 17 g by mouth daily as needed for mild constipation.     tamsulosin (FLOMAX) 0.4 MG CAPS capsule Take 0.4 mg by mouth daily.      traMADol (ULTRAM) 50 MG tablet Take 1 tablet (50 mg total) by mouth every 6 (six) hours as needed for moderate pain. 20 tablet 0   No current facility-administered medications for this visit.    Physical Examination  There were no vitals filed for this visit.  There is no height or weight on file to calculate BMI.  General:  Alert and oriented, no acute distress HEENT: Normal Neck: No bruit or JVD Pulmonary:  Clear to auscultation bilaterally Cardiac: Regular Rate and Rhythm without murmur Abdomen: Soft, non-tender, non-distended, no mass, no scars Skin: No rash Extremity Pulses:  2+ radial, brachial, femoral, dorsalis pedis, posterior tibial pulses bilaterally Musculoskeletal: No deformity or edema  Neurologic: Upper and lower extremity motor 5/5 and symmetric  DATA: ***   ASSESSMENT: ***   PLAN: ***   Roxy Horseman PA-C [facility] [place] Office: [phone]  MD in clinic [facility]

## 2021-05-06 ENCOUNTER — Ambulatory Visit: Payer: Medicare PPO

## 2021-05-11 ENCOUNTER — Ambulatory Visit (INDEPENDENT_AMBULATORY_CARE_PROVIDER_SITE_OTHER): Payer: Medicare PPO

## 2021-05-11 DIAGNOSIS — I442 Atrioventricular block, complete: Secondary | ICD-10-CM | POA: Diagnosis not present

## 2021-05-13 LAB — CUP PACEART REMOTE DEVICE CHECK
Battery Remaining Longevity: 92 mo
Battery Remaining Percentage: 88 %
Battery Voltage: 3.02 V
Brady Statistic AP VP Percent: 11 %
Brady Statistic AP VS Percent: 1 %
Brady Statistic AS VP Percent: 86 %
Brady Statistic AS VS Percent: 1 %
Brady Statistic RA Percent Paced: 12 %
Brady Statistic RV Percent Paced: 97 %
Date Time Interrogation Session: 20230223223403
Implantable Lead Implant Date: 20211124
Implantable Lead Implant Date: 20211124
Implantable Lead Location: 753859
Implantable Lead Location: 753860
Implantable Pulse Generator Implant Date: 20211124
Lead Channel Impedance Value: 450 Ohm
Lead Channel Impedance Value: 580 Ohm
Lead Channel Pacing Threshold Amplitude: 0.75 V
Lead Channel Pacing Threshold Amplitude: 1 V
Lead Channel Pacing Threshold Pulse Width: 0.5 ms
Lead Channel Pacing Threshold Pulse Width: 0.5 ms
Lead Channel Sensing Intrinsic Amplitude: 1.8 mV
Lead Channel Sensing Intrinsic Amplitude: 12 mV
Lead Channel Setting Pacing Amplitude: 2 V
Lead Channel Setting Pacing Amplitude: 2.5 V
Lead Channel Setting Pacing Pulse Width: 0.5 ms
Lead Channel Setting Sensing Sensitivity: 4 mV
Pulse Gen Model: 2272
Pulse Gen Serial Number: 3875505

## 2021-05-17 NOTE — Progress Notes (Signed)
Remote pacemaker transmission.   

## 2021-05-20 DIAGNOSIS — Z299 Encounter for prophylactic measures, unspecified: Secondary | ICD-10-CM | POA: Diagnosis not present

## 2021-05-20 DIAGNOSIS — I10 Essential (primary) hypertension: Secondary | ICD-10-CM | POA: Diagnosis not present

## 2021-05-20 DIAGNOSIS — F1120 Opioid dependence, uncomplicated: Secondary | ICD-10-CM | POA: Diagnosis not present

## 2021-05-20 DIAGNOSIS — J449 Chronic obstructive pulmonary disease, unspecified: Secondary | ICD-10-CM | POA: Diagnosis not present

## 2021-05-20 DIAGNOSIS — I739 Peripheral vascular disease, unspecified: Secondary | ICD-10-CM | POA: Diagnosis not present

## 2021-05-20 DIAGNOSIS — E1165 Type 2 diabetes mellitus with hyperglycemia: Secondary | ICD-10-CM | POA: Diagnosis not present

## 2021-06-07 NOTE — Progress Notes (Signed)
?HISTORY AND PHYSICAL  ? ? ? ?CC:  follow up. ?Requesting Provider:  [name], MD ? ?HPI: This is a 88 y.o. male who appears younger than stated age is here today for follow up for PAD.  He is followed by Dr. Donzetta Matters with history of non healing wound on the right first MT with angiogram intervention of right popliteal stent placement, Laser atherectomy of right popliteal artery and anterior tibial artery with 0.9 [place], Plain balloon angioplasty right anterior tibial artery with 3 mm and 2.5 mm balloons on 01/19/20.   ?            He also had left second toe ulcer.  02/23/20 the left LE was intervened on with Laser arthrectomy of left peroneal artery with 0.9 mm [place] and Plain balloon angioplasty left peroneal artery with 2.5 mm balloon.  He was returned to the OR for left first and  second toe amputation.  Most recently third toe amputation on 07/01/20.  These site have all healed well.  ? ?Pt was last seen 04/27/2021 and at that time, he had an ulcer on his right heel without any antecedent trauma.  He thought it was somewhat improved although he was having pain with walking.  He did not have any other skin changes.  He did have some difficulty with walking long distances and was requesting letter for mailbox to be placed on his house.  ? ?He was to continue wound care and follow up in a couple of weeks.  If it does not heal, he would need angiography.  ? ?The pt returns today for follow up.  He states he is doing well and feels his foot is getting better.  He is using betadine on the wound. ? ?Pt is a English as a second language teacher.  ? ?The pt is on a statin for cholesterol management.    ?The pt is on an aspirin.    Other AC:  none ?The pt is on CCB, BB for hypertension.  ?The pt does not have diabetes. ?Tobacco hx:  former ? ? ? ?Past Medical History:  ?Diagnosis Date  ? Acute lower GI bleeding   ? Recurrent: 2011, 2012  ? Bladder cancer ([place])   ? Transitional cell, 2001  ? Bradycardia 01/20/2020  ? CAD (coronary atherosclerotic  disease)   ? Nonobstructive, 05/2012  ? CHF (congestive heart failure) ([place])   ? Exertional dyspnea   ? EF 05-39%, grade 1 diastolic dysfunction, 09/6732  ? HLD (hyperlipidemia)   ? HTN (hypertension)   ? Neuropathy   ? Other insomnia   ? PAD (peripheral artery disease) ([place])   ? ABIs, 2009: 0.89 right; 0.99 left  ? Type 2 diabetes mellitus ([place])   ? Valvular heart disease   ? Vertigo   ? ? ?Past Surgical History:  ?Procedure Laterality Date  ? ABDOMINAL AORTOGRAM W/LOWER EXTREMITY N/A 01/19/2020  ? Procedure: ABDOMINAL AORTOGRAM W/ Bilateral LOWER EXTREMITY Runoff;  Surgeon: Waynetta Sandy, MD;  Location: [place] [facility];  Service: Cardiovascular;  Laterality: N/A;  ? ABDOMINAL AORTOGRAM W/LOWER EXTREMITY N/A 06/14/2020  ? Procedure: ABDOMINAL AORTOGRAM W/LOWER EXTREMITY;  Surgeon: Waynetta Sandy, MD;  Location: [place] [facility];  Service: Cardiovascular;  Laterality: N/A;  ? AMPUTATION Left 10/01/2019  ? Procedure: LEFT GREAT TOE AMPUTATION AT METATARSOPHALANGEAL JOINT;  Surgeon: [name], MD;  Location: [place];  Service: Orthopedics;  Laterality: Left;  ? AMPUTATION Left 02/24/2020  ? Procedure: LEFT SECOND TOE AMPUTATION;  Surgeon: Donzetta Matters,  Georgia Dom, MD;  Location: [place];  Service: Vascular;  Laterality: Left;  ? BIV PACEMAKER INSERTION CRT-P N/A 02/11/2020  ? Procedure: BIV PACEMAKER INSERTION CRT-P;  Surgeon: Thompson Grayer, MD;  Location: [place] [facility];  Service: Cardiovascular;  Laterality: N/A;  ? CATARACT EXTRACTION    ? bi lateral  ? COLONOSCOPY    ? HEMORRHOID SURGERY    ? INGUINAL HERNIA REPAIR    ? LOWER EXTREMITY ANGIOGRAPHY  02/23/2020  ? Procedure: Lower Extremity Angiography;  Surgeon: Waynetta Sandy, MD;  Location: [place] [facility];  Service: Cardiovascular;;  ? PERIPHERAL VASCULAR ATHERECTOMY Left 02/23/2020  ? Procedure: PERIPHERAL VASCULAR ATHERECTOMY;  Surgeon: Waynetta Sandy, MD;  Location: [place] [facility];  Service:  Cardiovascular;  Laterality: Left;  ? PERIPHERAL VASCULAR BALLOON ANGIOPLASTY  02/23/2020  ? Procedure: PERIPHERAL VASCULAR BALLOON ANGIOPLASTY;  Surgeon: Waynetta Sandy, MD;  Location: [place] [facility];  Service: Cardiovascular;;  left Peroneal   ? PERIPHERAL VASCULAR INTERVENTION Right 01/19/2020  ? Procedure: PERIPHERAL VASCULAR INTERVENTION;  Surgeon: Waynetta Sandy, MD;  Location: [place] [facility];  Service: Cardiovascular;  Laterality: Right;  ? PERIPHERAL VASCULAR INTERVENTION Right 06/14/2020  ? Procedure: PERIPHERAL VASCULAR INTERVENTION;  Surgeon: Waynetta Sandy, MD;  Location: [place] [facility];  Service: Cardiovascular;  Laterality: Right;  ? TRANSMETATARSAL AMPUTATION Left 07/01/2020  ? Procedure: AMPUTATION OF THIRD TOE ON THE LEFT FOOT;  Surgeon: Waynetta Sandy, MD;  Location: [place];  Service: Vascular;  Laterality: Left;  ? ? ?No Known Allergies ? ?Current Outpatient Medications  ?Medication Sig Dispense Refill  ? albuterol (VENTOLIN HFA) 108 (90 Base) MCG/ACT inhaler Inhale 2 puffs into the lungs every 6 (six) hours as needed for wheezing or shortness of breath.    ? allopurinol (ZYLOPRIM) 300 MG tablet Take 300 mg by mouth daily.     ? Alogliptin Benzoate 12.5 MG TABS Take 6.25 mg by mouth daily.    ? amLODipine (NORVASC) 10 MG tablet Take 1 tablet (10 mg total) by mouth daily. 90 tablet 3  ? aspirin EC 81 MG tablet Take 81 mg by mouth daily. Swallow whole.    ? atorvastatin (LIPITOR) 10 MG tablet Take 1 tablet (10 mg total) by mouth every evening. 30 tablet 6  ? carvedilol (COREG) 3.125 MG tablet Take 3.125 mg by mouth 2 (two) times daily with a meal.    ? cholecalciferol (VITAMIN D3) 25 MCG (1000 UNIT) tablet Take 1,000 Units by mouth daily.    ? nortriptyline (PAMELOR) 10 MG capsule Take 10 mg by mouth at bedtime.     ? polyethylene glycol (MIRALAX / GLYCOLAX) 17 g packet Take 17 g by mouth daily as needed for mild constipation.    ? tamsulosin  (FLOMAX) 0.4 MG CAPS capsule Take 0.4 mg by mouth daily.     ? traMADol (ULTRAM) 50 MG tablet Take 1 tablet (50 mg total) by mouth every 6 (six) hours as needed for moderate pain. 20 tablet 0  ? ?No current facility-administered medications for this visit.  ? ? ?Family History  ?Problem Relation Age of Onset  ? Heart attack Mother   ? Heart attack Father   ? Heart attack Sister   ? Heart attack Sister   ? Heart attack Sister   ? Heart attack Sister   ? ? ?Social History  ? ?Socioeconomic History  ? Marital status: Married  ?  Spouse name: Not on file  ? Number of  children: Not on file  ? Years of education: Not on file  ? Highest education level: Not on file  ?Occupational History  ? Not on file  ?Tobacco Use  ? Smoking status: Former  ?  Types: Cigarettes  ?  Quit date: 04/19/1972  ?  Years since quitting: 49.1  ? Smokeless tobacco: Never  ?Vaping Use  ? Vaping Use: Never used  ?Substance and Sexual Activity  ? Alcohol use: Never  ? Drug use: Never  ? Sexual activity: Not on file  ?Other Topics Concern  ? Not on file  ?Social History Narrative  ? Not on file  ? ?Social Determinants of Health  ? ?Financial Resource Strain: Not on file  ?Food Insecurity: Not on file  ?Transportation Needs: Not on file  ?Physical Activity: Not on file  ?Stress: Not on file  ?Social Connections: Not on file  ?Intimate Partner Violence: Not on file  ? ? ? ?REVIEW OF SYSTEMS:  ? ?'[X]'$  denotes positive finding, '[ ]'$  denotes negative finding ?Cardiac  Comments:  ?Chest pain or chest pressure:    ?Shortness of breath upon exertion:    ?Short of breath when lying flat:    ?Irregular heart rhythm:    ?    ?Vascular    ?Pain in calf, thigh, or hip brought on by ambulation:    ?Pain in feet at night that wakes you up from your sleep:     ?Blood clot in your veins:    ?Leg swelling:     ?    ?Pulmonary    ?Oxygen at home:    ?Productive cough:     ?Wheezing:     ?    ?Neurologic    ?Sudden weakness in arms or legs:     ?Sudden numbness in arms or  legs:     ?Sudden onset of difficulty speaking or slurred speech:    ?Temporary loss of vision in one eye:     ?Problems with dizziness:     ?    ?Gastrointestinal    ?Blood in stool:     ?Vomited blood:     ?    ?Genitourin

## 2021-06-10 ENCOUNTER — Ambulatory Visit (INDEPENDENT_AMBULATORY_CARE_PROVIDER_SITE_OTHER): Payer: Medicare PPO | Admitting: Physician Assistant

## 2021-06-10 ENCOUNTER — Other Ambulatory Visit: Payer: Self-pay

## 2021-06-10 ENCOUNTER — Encounter: Payer: Self-pay | Admitting: Physician Assistant

## 2021-06-10 VITALS — BP 135/71 | HR 82 | Temp 98.2°F | Resp 20 | Ht 73.0 in | Wt 207.0 lb

## 2021-06-10 DIAGNOSIS — S91301D Unspecified open wound, right foot, subsequent encounter: Secondary | ICD-10-CM | POA: Diagnosis not present

## 2021-06-16 DIAGNOSIS — R5383 Other fatigue: Secondary | ICD-10-CM | POA: Diagnosis not present

## 2021-06-16 DIAGNOSIS — G4709 Other insomnia: Secondary | ICD-10-CM | POA: Diagnosis not present

## 2021-07-06 ENCOUNTER — Encounter: Payer: Self-pay | Admitting: Vascular Surgery

## 2021-07-06 ENCOUNTER — Ambulatory Visit (INDEPENDENT_AMBULATORY_CARE_PROVIDER_SITE_OTHER): Payer: Medicare PPO | Admitting: Vascular Surgery

## 2021-07-06 VITALS — BP 122/67 | HR 89 | Temp 98.0°F | Resp 20 | Ht 73.0 in | Wt 204.0 lb

## 2021-07-06 DIAGNOSIS — I739 Peripheral vascular disease, unspecified: Secondary | ICD-10-CM | POA: Diagnosis not present

## 2021-07-06 NOTE — Progress Notes (Signed)
? ?Patient ID: Peter James., male   DOB: October 20, 1925, 88 y.o.   MRN: [id number] ? ?Reason for Consult: Follow-up ?  ?Referred by [name], MD ? ?Subjective:  ?   ?HPI: ? ?Peter James. is a 88 y.o. male with history of bilateral lower extremity tibial interventions.  He has toe amputations on the left which is well-healed.  He recently had a recurrent right heel ulceration which we are following.  He states that this is improving.  He continues to walk with minimal cane assistance.  He remains very active having just turned 89 eleven days ago.  He continues to take aspirin and a statin ? ?Past Medical History:  ?Diagnosis Date  ? Acute lower GI bleeding   ? Recurrent: 2011, 2012  ? Bladder cancer ([place])   ? Transitional cell, 2001  ? Bradycardia 01/20/2020  ? CAD (coronary atherosclerotic disease)   ? Nonobstructive, 05/2012  ? CHF (congestive heart failure) ([place])   ? Exertional dyspnea   ? EF 40-98%, grade 1 diastolic dysfunction, 03/1912  ? HLD (hyperlipidemia)   ? HTN (hypertension)   ? Neuropathy   ? Other insomnia   ? PAD (peripheral artery disease) ([place])   ? ABIs, 2009: 0.89 right; 0.99 left  ? Type 2 diabetes mellitus ([place])   ? Valvular heart disease   ? Vertigo   ? ?Family History  ?Problem Relation Age of Onset  ? Heart attack Mother   ? Heart attack Father   ? Heart attack Sister   ? Heart attack Sister   ? Heart attack Sister   ? Heart attack Sister   ? ?Past Surgical History:  ?Procedure Laterality Date  ? ABDOMINAL AORTOGRAM W/LOWER EXTREMITY N/A 01/19/2020  ? Procedure: ABDOMINAL AORTOGRAM W/ Bilateral LOWER EXTREMITY Runoff;  Surgeon: Waynetta Sandy, MD;  Location: [facility];  Service: Cardiovascular;  Laterality: N/A;  ? ABDOMINAL AORTOGRAM W/LOWER EXTREMITY N/A 06/14/2020  ? Procedure: ABDOMINAL AORTOGRAM W/LOWER EXTREMITY;  Surgeon: Waynetta Sandy, MD;  Location: [facility];  Service: Cardiovascular;  Laterality: N/A;  ? AMPUTATION Left 10/01/2019  ?  Procedure: LEFT GREAT TOE AMPUTATION AT METATARSOPHALANGEAL JOINT;  Surgeon: [name], MD;  Location: [facility];  Service: Orthopedics;  Laterality: Left;  ? AMPUTATION Left 02/24/2020  ? Procedure: LEFT SECOND TOE AMPUTATION;  Surgeon: Waynetta Sandy, MD;  Location: [facility];  Service: Vascular;  Laterality: Left;  ? BIV PACEMAKER INSERTION CRT-P N/A 02/11/2020  ? Procedure: BIV PACEMAKER INSERTION CRT-P;  Surgeon: Thompson Grayer, MD;  Location: [facility];  Service: Cardiovascular;  Laterality: N/A;  ? CATARACT EXTRACTION    ? bi lateral  ? COLONOSCOPY    ? HEMORRHOID SURGERY    ? INGUINAL HERNIA REPAIR    ? LOWER EXTREMITY ANGIOGRAPHY  02/23/2020  ? Procedure: Lower Extremity Angiography;  Surgeon: Waynetta Sandy, MD;  Location: [facility];  Service: Cardiovascular;;  ? PERIPHERAL VASCULAR ATHERECTOMY Left 02/23/2020  ? Procedure: PERIPHERAL VASCULAR ATHERECTOMY;  Surgeon: Waynetta Sandy, MD;  Location: [facility];  Service: Cardiovascular;  Laterality: Left;  ? PERIPHERAL VASCULAR BALLOON ANGIOPLASTY  02/23/2020  ? Procedure: PERIPHERAL VASCULAR BALLOON ANGIOPLASTY;  Surgeon: Waynetta Sandy, MD;  Location: [facility];  Service: Cardiovascular;;  left Peroneal   ? PERIPHERAL VASCULAR INTERVENTION Right 01/19/2020  ? Procedure: PERIPHERAL VASCULAR INTERVENTION;  Surgeon: Waynetta Sandy, MD;  Location: [facility];  Service: Cardiovascular;  Laterality: Right;  ? PERIPHERAL VASCULAR INTERVENTION Right 06/14/2020  ? Procedure: PERIPHERAL VASCULAR INTERVENTION;  Surgeon: Waynetta Sandy, MD;  Location: [facility];  Service: Cardiovascular;  Laterality: Right;  ? TRANSMETATARSAL AMPUTATION Left 07/01/2020  ? Procedure: AMPUTATION OF THIRD TOE ON THE LEFT FOOT;  Surgeon: Waynetta Sandy, MD;  Location: [facility];  Service: Vascular;  Laterality: Left;  ? ? ?Short Social History:  ?Social History  ? ?Tobacco Use   ? Smoking status: Former  ?  Types: Cigarettes  ?  Quit date: 04/19/1972  ?  Years since quitting: 49.2  ? Smokeless tobacco: Never  ?Substance Use Topics  ? Alcohol use: Never  ? ? ?No Known Allergies ? ?Current Outpatient Medications  ?Medication Sig Dispense Refill  ? albuterol (VENTOLIN HFA) 108 (90 Base) MCG/ACT inhaler Inhale 2 puffs into the lungs every 6 (six) hours as needed for wheezing or shortness of breath.    ? allopurinol (ZYLOPRIM) 300 MG tablet Take 300 mg by mouth daily.     ? Alogliptin Benzoate 12.5 MG TABS Take 6.25 mg by mouth daily.    ? amLODipine (NORVASC) 10 MG tablet Take 1 tablet (10 mg total) by mouth daily. 90 tablet 3  ? aspirin EC 81 MG tablet Take 81 mg by mouth daily. Swallow whole.    ? atorvastatin (LIPITOR) 10 MG tablet Take 1 tablet (10 mg total) by mouth every evening. 30 tablet 6  ? carvedilol (COREG) 3.125 MG tablet Take 3.125 mg by mouth 2 (two) times daily with a meal.    ? cholecalciferol (VITAMIN D3) 25 MCG (1000 UNIT) tablet Take 1,000 Units by mouth daily.    ? nortriptyline (PAMELOR) 10 MG capsule Take 10 mg by mouth at bedtime.     ? polyethylene glycol (MIRALAX / GLYCOLAX) 17 g packet Take 17 g by mouth daily as needed for mild constipation.    ? tamsulosin (FLOMAX) 0.4 MG CAPS capsule Take 0.4 mg by mouth daily.     ? ?No current facility-administered medications for this visit.  ? ? ?Review of Systems  ?Constitutional:  Constitutional negative. ?HENT: HENT negative.  ?Eyes: Eyes negative.  ?Respiratory: Respiratory negative.  ?Cardiovascular: Cardiovascular negative.  ?GI: Gastrointestinal negative.  ?Musculoskeletal: Musculoskeletal negative.  ?Skin: Positive for wound.  ?Neurological: Neurological negative. ?Hematologic: Hematologic/lymphatic negative.  ?Psychiatric: Psychiatric negative.   ? ?   ?Objective:  ?Objective  ? ?Vitals:  ? 07/06/21 1006  ?BP: 122/67  ?Pulse: 89  ?Resp: 20  ?Temp: 98 ?F (36.7 ?C)  ?SpO2: 93%  ?Weight: 204 lb (92.5 kg)  ?Height: '6\' 1"'$   (1.854 m)  ? ?Body mass index is 26.91 kg/m?. ? ?Physical Exam ?HENT:  ?   Head: Normocephalic.  ?   Nose: Nose normal.  ?Eyes:  ?   Pupils: Pupils are equal, round, and reactive to light.  ?Cardiovascular:  ?   Comments: There is a very strong right anterior tibial signal like to be traced out distally on the right foot and there is a very strong left dorsalis pedis signal to the midfoot ?Pulmonary:  ?   Effort: Pulmonary effort is normal.  ?Abdominal:  ?   General: Abdomen is flat.  ?Musculoskeletal:  ?   Right lower leg: No edema.  ?   Left lower leg: No edema.  ?   Comments: Well healed left toe amputations  ?Skin: ?   General: Skin is warm.  ?   Capillary Refill: Capillary refill takes less than  2 seconds.  ?   Comments: Heel wound on right as below  ?Neurological:  ?   General: No focal deficit present.  ?   Mental Status: He is alert.  ?Psychiatric:     ?   Mood and Affect: Mood normal.  ? ? ? ? ? ? ? ?Data: ?Recent ABI's ordered ?    ?Assessment/Plan:  ?  ?88 year old male with previous bilateral lower extremity tibial interventions.  Both feet appear well perfused and his heel ulceration is improving on the right side as noted above.  He has very strong signals in both feet and they both appear to have healthy skin throughout also as pictured above.  Given his extensive disease I would not recommend any intervention at this time.  I will have him follow-up in 3 to 4 months with ABIs.  I discussed the importance of protecting his feet and he is very diligent about this and will let us know if any wounds began to worsen otherwise we will plan to see him in the above-noted timeframe. ? ?  ? ?Waynetta Sandy MD ?[facility] ? ? ?

## 2021-07-07 DIAGNOSIS — Z6828 Body mass index (BMI) 28.0-28.9, adult: Secondary | ICD-10-CM | POA: Diagnosis not present

## 2021-07-07 DIAGNOSIS — Z1331 Encounter for screening for depression: Secondary | ICD-10-CM | POA: Diagnosis not present

## 2021-07-07 DIAGNOSIS — Z1339 Encounter for screening examination for other mental health and behavioral disorders: Secondary | ICD-10-CM | POA: Diagnosis not present

## 2021-07-07 DIAGNOSIS — Z79899 Other long term (current) drug therapy: Secondary | ICD-10-CM | POA: Diagnosis not present

## 2021-07-07 DIAGNOSIS — Z0000 Encounter for general adult medical examination without abnormal findings: Secondary | ICD-10-CM | POA: Diagnosis not present

## 2021-07-07 DIAGNOSIS — Z125 Encounter for screening for malignant neoplasm of prostate: Secondary | ICD-10-CM | POA: Diagnosis not present

## 2021-07-07 DIAGNOSIS — R5383 Other fatigue: Secondary | ICD-10-CM | POA: Diagnosis not present

## 2021-07-07 DIAGNOSIS — Z299 Encounter for prophylactic measures, unspecified: Secondary | ICD-10-CM | POA: Diagnosis not present

## 2021-07-07 DIAGNOSIS — Z7189 Other specified counseling: Secondary | ICD-10-CM | POA: Diagnosis not present

## 2021-07-07 DIAGNOSIS — E7800 Pure hypercholesterolemia, unspecified: Secondary | ICD-10-CM | POA: Diagnosis not present

## 2021-07-07 DIAGNOSIS — I10 Essential (primary) hypertension: Secondary | ICD-10-CM | POA: Diagnosis not present

## 2021-07-08 ENCOUNTER — Other Ambulatory Visit: Payer: Self-pay | Admitting: *Deleted

## 2021-07-08 DIAGNOSIS — S91301D Unspecified open wound, right foot, subsequent encounter: Secondary | ICD-10-CM

## 2021-07-08 DIAGNOSIS — I739 Peripheral vascular disease, unspecified: Secondary | ICD-10-CM

## 2021-08-10 ENCOUNTER — Ambulatory Visit (INDEPENDENT_AMBULATORY_CARE_PROVIDER_SITE_OTHER): Payer: Medicare PPO

## 2021-08-10 DIAGNOSIS — I442 Atrioventricular block, complete: Secondary | ICD-10-CM

## 2021-08-10 LAB — CUP PACEART REMOTE DEVICE CHECK
Battery Remaining Longevity: 90 mo
Battery Remaining Percentage: 85 %
Battery Voltage: 3.01 V
Brady Statistic AP VP Percent: 11 %
Brady Statistic AP VS Percent: 1.6 %
Brady Statistic AS VP Percent: 86 %
Brady Statistic AS VS Percent: 1 %
Brady Statistic RA Percent Paced: 11 %
Brady Statistic RV Percent Paced: 96 %
Date Time Interrogation Session: 20230524121524
Implantable Lead Implant Date: 20211124
Implantable Lead Implant Date: 20211124
Implantable Lead Location: 753859
Implantable Lead Location: 753860
Implantable Pulse Generator Implant Date: 20211124
Lead Channel Impedance Value: 480 Ohm
Lead Channel Impedance Value: 580 Ohm
Lead Channel Pacing Threshold Amplitude: 0.75 V
Lead Channel Pacing Threshold Amplitude: 1 V
Lead Channel Pacing Threshold Pulse Width: 0.5 ms
Lead Channel Pacing Threshold Pulse Width: 0.5 ms
Lead Channel Sensing Intrinsic Amplitude: 3.6 mV
Lead Channel Sensing Intrinsic Amplitude: 7.4 mV
Lead Channel Setting Pacing Amplitude: 2 V
Lead Channel Setting Pacing Amplitude: 2.5 V
Lead Channel Setting Pacing Pulse Width: 0.5 ms
Lead Channel Setting Sensing Sensitivity: 4 mV
Pulse Gen Model: 2272
Pulse Gen Serial Number: 3875505

## 2021-08-23 NOTE — Progress Notes (Signed)
Remote pacemaker transmission.   

## 2021-08-26 ENCOUNTER — Encounter: Payer: Medicare PPO | Admitting: Internal Medicine

## 2021-09-06 DIAGNOSIS — Z0000 Encounter for general adult medical examination without abnormal findings: Secondary | ICD-10-CM | POA: Diagnosis not present

## 2021-09-06 DIAGNOSIS — E1165 Type 2 diabetes mellitus with hyperglycemia: Secondary | ICD-10-CM | POA: Diagnosis not present

## 2021-09-06 DIAGNOSIS — S98132A Complete traumatic amputation of one left lesser toe, initial encounter: Secondary | ICD-10-CM | POA: Diagnosis not present

## 2021-09-06 DIAGNOSIS — Z299 Encounter for prophylactic measures, unspecified: Secondary | ICD-10-CM | POA: Diagnosis not present

## 2021-09-06 DIAGNOSIS — I10 Essential (primary) hypertension: Secondary | ICD-10-CM | POA: Diagnosis not present

## 2021-09-23 ENCOUNTER — Encounter: Payer: Medicare PPO | Admitting: Internal Medicine

## 2021-10-05 ENCOUNTER — Ambulatory Visit: Payer: Medicare PPO

## 2021-10-05 ENCOUNTER — Encounter (HOSPITAL_COMMUNITY): Payer: Medicare PPO

## 2021-10-21 ENCOUNTER — Telehealth: Payer: Self-pay | Admitting: Internal Medicine

## 2021-10-21 ENCOUNTER — Encounter: Payer: Self-pay | Admitting: Internal Medicine

## 2021-10-21 ENCOUNTER — Ambulatory Visit (INDEPENDENT_AMBULATORY_CARE_PROVIDER_SITE_OTHER): Payer: Medicare PPO | Admitting: Internal Medicine

## 2021-10-21 ENCOUNTER — Encounter: Payer: Self-pay | Admitting: *Deleted

## 2021-10-21 VITALS — BP 118/90 | HR 88 | Ht 73.0 in | Wt 208.6 lb

## 2021-10-21 DIAGNOSIS — I442 Atrioventricular block, complete: Secondary | ICD-10-CM | POA: Diagnosis not present

## 2021-10-21 DIAGNOSIS — D6869 Other thrombophilia: Secondary | ICD-10-CM

## 2021-10-21 DIAGNOSIS — I480 Paroxysmal atrial fibrillation: Secondary | ICD-10-CM

## 2021-10-21 DIAGNOSIS — I441 Atrioventricular block, second degree: Secondary | ICD-10-CM

## 2021-10-21 LAB — CUP PACEART INCLINIC DEVICE CHECK
Battery Remaining Longevity: 88 mo
Battery Voltage: 3.01 V
Brady Statistic RA Percent Paced: 11 %
Brady Statistic RV Percent Paced: 95 %
Date Time Interrogation Session: 20230804085948
Implantable Lead Implant Date: 20211124
Implantable Lead Implant Date: 20211124
Implantable Lead Location: 753859
Implantable Lead Location: 753860
Implantable Pulse Generator Implant Date: 20211124
Lead Channel Impedance Value: 487.5 Ohm
Lead Channel Impedance Value: 575 Ohm
Lead Channel Pacing Threshold Amplitude: 0.75 V
Lead Channel Pacing Threshold Amplitude: 0.75 V
Lead Channel Pacing Threshold Amplitude: 1 V
Lead Channel Pacing Threshold Amplitude: 1 V
Lead Channel Pacing Threshold Pulse Width: 0.5 ms
Lead Channel Pacing Threshold Pulse Width: 0.5 ms
Lead Channel Pacing Threshold Pulse Width: 0.5 ms
Lead Channel Pacing Threshold Pulse Width: 0.5 ms
Lead Channel Sensing Intrinsic Amplitude: 12 mV
Lead Channel Sensing Intrinsic Amplitude: 3.7 mV
Lead Channel Setting Pacing Amplitude: 2 V
Lead Channel Setting Pacing Amplitude: 2.5 V
Lead Channel Setting Pacing Pulse Width: 0.5 ms
Lead Channel Setting Sensing Sensitivity: 4 mV
Pulse Gen Model: 2272
Pulse Gen Serial Number: 3875505

## 2021-10-21 MED ORDER — APIXABAN 2.5 MG PO TABS: 2.5000 mg | ORAL_TABLET | Freq: Two times a day (BID) | ORAL | 3 refills | Status: DC

## 2021-10-21 MED ORDER — APIXABAN 2.5 MG PO TABS: 2.5000 mg | ORAL_TABLET | Freq: Two times a day (BID) | ORAL | 0 refills | Status: DC

## 2021-10-21 NOTE — Patient Instructions (Addendum)
Medication Instructions:  Stop Aspirin Begin Eliquis 2.'5mg'$  twice a day   Continue all other medications.     Labwork: none  Testing/Procedures: none  Follow-Up: 1 year   Any Other Special Instructions Will Be Listed Below (If Applicable).   If you need a refill on your cardiac medications before your next appointment, please call your pharmacy.

## 2021-10-21 NOTE — Progress Notes (Signed)
PCP: Thompson Grayer, MD   Primary EP:  Dr Buel Ream Wm Sahagun. is a 88 y.o. male who presents today for routine electrophysiology followup.  Since last being seen in our clinic, the patient reports doing very well.  Today, he denies symptoms of palpitations, chest pain, shortness of breath,  lower extremity edema, dizziness, presyncope, or syncope.  The patient is otherwise without complaint today.   Past Medical History:  Diagnosis Date   Acute lower GI bleeding    Recurrent: 2011, 2012   Bladder cancer ([place])    Transitional cell, 2001   Bradycardia 01/20/2020   CAD (coronary atherosclerotic disease)    Nonobstructive, 05/2012   CHF (congestive heart failure) ([name])    Exertional dyspnea    EF 33-82%, grade 1 diastolic dysfunction, 07/537   HLD (hyperlipidemia)    HTN (hypertension)    Neuropathy    Other insomnia    PAD (peripheral artery disease) ([place])    ABIs, 2009: 0.89 right; 0.99 left   Type 2 diabetes mellitus ([place])    Valvular heart disease    Vertigo    Past Surgical History:  Procedure Laterality Date   ABDOMINAL AORTOGRAM W/LOWER EXTREMITY N/A 01/19/2020   Procedure: ABDOMINAL AORTOGRAM W/ Bilateral LOWER EXTREMITY Runoff;  Surgeon: Waynetta Sandy, MD;  Location: [facility];  Service: Cardiovascular;  Laterality: N/A;   ABDOMINAL AORTOGRAM W/LOWER EXTREMITY N/A 06/14/2020   Procedure: ABDOMINAL AORTOGRAM W/LOWER EXTREMITY;  Surgeon: Waynetta Sandy, MD;  Location: [facility];  Service: Cardiovascular;  Laterality: N/A;   AMPUTATION Left 10/01/2019   Procedure: LEFT GREAT TOE AMPUTATION AT METATARSOPHALANGEAL JOINT;  Surgeon: [name], MD;  Location: [facility];  Service: Orthopedics;  Laterality: Left;   AMPUTATION Left 02/24/2020   Procedure: LEFT SECOND TOE AMPUTATION;  Surgeon: Waynetta Sandy, MD;  Location: [facility];  Service: Vascular;  Laterality: Left;   BIV PACEMAKER INSERTION CRT-P N/A 02/11/2020    Procedure: BIV PACEMAKER INSERTION CRT-P;  Surgeon: Thompson Grayer, MD;  Location: [facility];  Service: Cardiovascular;  Laterality: N/A;   CATARACT EXTRACTION     bi lateral   COLONOSCOPY     HEMORRHOID SURGERY     INGUINAL HERNIA REPAIR     LOWER EXTREMITY ANGIOGRAPHY  02/23/2020   Procedure: Lower Extremity Angiography;  Surgeon: Waynetta Sandy, MD;  Location: [facility];  Service: Cardiovascular;;   PERIPHERAL VASCULAR ATHERECTOMY Left 02/23/2020   Procedure: PERIPHERAL VASCULAR ATHERECTOMY;  Surgeon: Waynetta Sandy, MD;  Location: [facility];  Service: Cardiovascular;  Laterality: Left;   PERIPHERAL VASCULAR BALLOON ANGIOPLASTY  02/23/2020   Procedure: PERIPHERAL VASCULAR BALLOON ANGIOPLASTY;  Surgeon: Waynetta Sandy, MD;  Location: [facility];  Service: Cardiovascular;;  left Peroneal    PERIPHERAL VASCULAR INTERVENTION Right 01/19/2020   Procedure: PERIPHERAL VASCULAR INTERVENTION;  Surgeon: Waynetta Sandy, MD;  Location: [facility];  Service: Cardiovascular;  Laterality: Right;   PERIPHERAL VASCULAR INTERVENTION Right 06/14/2020   Procedure: PERIPHERAL VASCULAR INTERVENTION;  Surgeon: Waynetta Sandy, MD;  Location: [facility];  Service: Cardiovascular;  Laterality: Right;   TRANSMETATARSAL AMPUTATION Left 07/01/2020   Procedure: AMPUTATION OF THIRD TOE ON THE LEFT FOOT;  Surgeon: Waynetta Sandy, MD;  Location: [facility];  Service: Vascular;  Laterality: Left;    ROS- all systems are reviewed and negative except as per HPI above  Current Outpatient Medications  Medication Sig  Dispense Refill   albuterol (VENTOLIN HFA) 108 (90 Base) MCG/ACT inhaler Inhale 2 puffs into the lungs every 6 (six) hours as needed for wheezing or shortness of breath.     allopurinol (ZYLOPRIM) 300 MG tablet Take 300 mg by mouth daily.      Alogliptin Benzoate 12.5 MG TABS Take 6.25 mg by mouth daily.      amLODipine (NORVASC) 10 MG tablet Take 1 tablet (10 mg total) by mouth daily. 90 tablet 3   aspirin EC 81 MG tablet Take 81 mg by mouth daily. Swallow whole.     atorvastatin (LIPITOR) 10 MG tablet Take 1 tablet (10 mg total) by mouth every evening. 30 tablet 6   carvedilol (COREG) 3.125 MG tablet Take 3.125 mg by mouth 2 (two) times daily with a meal.     cholecalciferol (VITAMIN D3) 25 MCG (1000 UNIT) tablet Take 1,000 Units by mouth daily.     nortriptyline (PAMELOR) 10 MG capsule Take 10 mg by mouth at bedtime.      polyethylene glycol (MIRALAX / GLYCOLAX) 17 g packet Take 17 g by mouth daily as needed for mild constipation.     tamsulosin (FLOMAX) 0.4 MG CAPS capsule Take 0.4 mg by mouth daily.      No current facility-administered medications for this visit.    Physical Exam: Vitals:   10/21/21 0841  BP: (!) 118/90  Pulse: 88  SpO2: 95%  Weight: 208 lb 9.6 oz (94.6 kg)  Height: '6\' 1"'$  (1.854 m)    GEN- The patient is well appearing, alert and oriented x 3 today.   Head- normocephalic, atraumatic Eyes-  Sclera clear, conjunctiva pink Ears- hearing intact Oropharynx- clear Lungs- Clear to ausculation bilaterally, normal work of breathing Chest- pacemaker pocket is well healed Heart- Regular rate and rhythm, no murmurs, rubs or gallops, PMI not laterally displaced GI- soft, NT, ND, + BS Extremities- no clubbing, cyanosis, or edema  Pacemaker interrogation- reviewed in detail today,  See PACEART report    Assessment and Plan:  1. Symptomatic complete heart block Normal pacemaker function See Pace Art report No changes today he is device dependant today  2. Afib Burden is 6 % (previously 3.5%) We discussed pros and cons of [place] therapy at length today.  Risks and benefits to eliquis were discussed.  The patient denies additional bleeding risks or falls.  He wishes to start eliquis at this time. Stop ASA Start eliquis 2.'5mg'$  BID (given previously elevated CrCl).  I will  try to obtain records from the [place] with his recent labs.  He has scheduled follow-up with Dr Woody Seller next month.  I will route my note to Dr Woody Seller today.  3. Nonischemic CM/ chronic systolic dysfunction Stable No change required today   Risks, benefits and potential toxicities for medications prescribed and/or refilled reviewed with patient today.   Return in a year  Thompson Grayer MD, [facility] 10/21/2021 9:06 AM

## 2021-10-21 NOTE — Telephone Encounter (Signed)
Patient states the pharmacy [address], [place] did not received the script for Eliquis.  Please send.

## 2021-10-21 NOTE — Telephone Encounter (Signed)
Done

## 2021-11-09 ENCOUNTER — Ambulatory Visit (INDEPENDENT_AMBULATORY_CARE_PROVIDER_SITE_OTHER): Payer: Medicare PPO

## 2021-11-09 DIAGNOSIS — I442 Atrioventricular block, complete: Secondary | ICD-10-CM

## 2021-11-11 LAB — CUP PACEART REMOTE DEVICE CHECK
Battery Remaining Longevity: 89 mo
Battery Remaining Percentage: 83 %
Battery Voltage: 3.01 V
Brady Statistic AP VP Percent: 8.7 %
Brady Statistic AP VS Percent: 2.2 %
Brady Statistic AS VP Percent: 87 %
Brady Statistic AS VS Percent: 1 %
Brady Statistic RA Percent Paced: 10 %
Brady Statistic RV Percent Paced: 95 %
Date Time Interrogation Session: 20230824180430
Implantable Lead Implant Date: 20211124
Implantable Lead Implant Date: 20211124
Implantable Lead Location: 753859
Implantable Lead Location: 753860
Implantable Pulse Generator Implant Date: 20211124
Lead Channel Impedance Value: 490 Ohm
Lead Channel Impedance Value: 580 Ohm
Lead Channel Pacing Threshold Amplitude: 0.75 V
Lead Channel Pacing Threshold Amplitude: 1 V
Lead Channel Pacing Threshold Pulse Width: 0.5 ms
Lead Channel Pacing Threshold Pulse Width: 0.5 ms
Lead Channel Sensing Intrinsic Amplitude: 3.6 mV
Lead Channel Sensing Intrinsic Amplitude: 4.8 mV
Lead Channel Setting Pacing Amplitude: 2 V
Lead Channel Setting Pacing Amplitude: 2.5 V
Lead Channel Setting Pacing Pulse Width: 0.5 ms
Lead Channel Setting Sensing Sensitivity: 4 mV
Pulse Gen Model: 2272
Pulse Gen Serial Number: 3875505

## 2021-11-17 DIAGNOSIS — G629 Polyneuropathy, unspecified: Secondary | ICD-10-CM | POA: Diagnosis not present

## 2021-11-17 DIAGNOSIS — M109 Gout, unspecified: Secondary | ICD-10-CM | POA: Diagnosis not present

## 2021-12-06 NOTE — Progress Notes (Signed)
Remote pacemaker transmission.   

## 2021-12-27 ENCOUNTER — Other Ambulatory Visit: Payer: Self-pay

## 2021-12-27 DIAGNOSIS — I739 Peripheral vascular disease, unspecified: Secondary | ICD-10-CM

## 2022-01-04 ENCOUNTER — Ambulatory Visit: Payer: Medicare PPO | Admitting: Vascular Surgery

## 2022-01-04 DIAGNOSIS — Z789 Other specified health status: Secondary | ICD-10-CM | POA: Diagnosis not present

## 2022-01-04 DIAGNOSIS — E1159 Type 2 diabetes mellitus with other circulatory complications: Secondary | ICD-10-CM | POA: Diagnosis not present

## 2022-01-04 DIAGNOSIS — I152 Hypertension secondary to endocrine disorders: Secondary | ICD-10-CM | POA: Diagnosis not present

## 2022-01-04 DIAGNOSIS — I10 Essential (primary) hypertension: Secondary | ICD-10-CM | POA: Diagnosis not present

## 2022-01-04 DIAGNOSIS — Z299 Encounter for prophylactic measures, unspecified: Secondary | ICD-10-CM | POA: Diagnosis not present

## 2022-02-08 ENCOUNTER — Ambulatory Visit (INDEPENDENT_AMBULATORY_CARE_PROVIDER_SITE_OTHER): Payer: Medicare PPO

## 2022-02-08 DIAGNOSIS — I442 Atrioventricular block, complete: Secondary | ICD-10-CM

## 2022-02-10 LAB — CUP PACEART REMOTE DEVICE CHECK
Battery Remaining Longevity: 85 mo
Battery Remaining Percentage: 80 %
Battery Voltage: 3.01 V
Brady Statistic AP VP Percent: 7.1 %
Brady Statistic AP VS Percent: 1 %
Brady Statistic AS VP Percent: 91 %
Brady Statistic AS VS Percent: 1 %
Brady Statistic RA Percent Paced: 8.5 %
Brady Statistic RV Percent Paced: 98 %
Date Time Interrogation Session: 20231122183831
Implantable Lead Connection Status: 753985
Implantable Lead Connection Status: 753985
Implantable Lead Implant Date: 20211124
Implantable Lead Implant Date: 20211124
Implantable Lead Location: 753859
Implantable Lead Location: 753860
Implantable Pulse Generator Implant Date: 20211124
Lead Channel Impedance Value: 480 Ohm
Lead Channel Impedance Value: 560 Ohm
Lead Channel Pacing Threshold Amplitude: 0.75 V
Lead Channel Pacing Threshold Amplitude: 1 V
Lead Channel Pacing Threshold Pulse Width: 0.5 ms
Lead Channel Pacing Threshold Pulse Width: 0.5 ms
Lead Channel Sensing Intrinsic Amplitude: 3.7 mV
Lead Channel Sensing Intrinsic Amplitude: 4.5 mV
Lead Channel Setting Pacing Amplitude: 2 V
Lead Channel Setting Pacing Amplitude: 2.5 V
Lead Channel Setting Pacing Pulse Width: 0.5 ms
Lead Channel Setting Sensing Sensitivity: 4 mV
Pulse Gen Model: 2272
Pulse Gen Serial Number: 3875505

## 2022-03-01 ENCOUNTER — Ambulatory Visit: Payer: Medicare PPO | Admitting: Vascular Surgery

## 2022-03-01 NOTE — Progress Notes (Signed)
Remote pacemaker transmission.   

## 2022-03-29 ENCOUNTER — Ambulatory Visit: Payer: Medicare PPO | Admitting: Vascular Surgery

## 2022-04-25 DIAGNOSIS — E1159 Type 2 diabetes mellitus with other circulatory complications: Secondary | ICD-10-CM | POA: Diagnosis not present

## 2022-04-25 DIAGNOSIS — I10 Essential (primary) hypertension: Secondary | ICD-10-CM | POA: Diagnosis not present

## 2022-04-25 DIAGNOSIS — I152 Hypertension secondary to endocrine disorders: Secondary | ICD-10-CM | POA: Diagnosis not present

## 2022-04-25 DIAGNOSIS — Z299 Encounter for prophylactic measures, unspecified: Secondary | ICD-10-CM | POA: Diagnosis not present

## 2022-04-25 DIAGNOSIS — Z6828 Body mass index (BMI) 28.0-28.9, adult: Secondary | ICD-10-CM | POA: Diagnosis not present

## 2022-05-10 ENCOUNTER — Ambulatory Visit (INDEPENDENT_AMBULATORY_CARE_PROVIDER_SITE_OTHER): Payer: Medicare PPO

## 2022-05-10 DIAGNOSIS — I442 Atrioventricular block, complete: Secondary | ICD-10-CM

## 2022-05-15 LAB — CUP PACEART REMOTE DEVICE CHECK
Battery Remaining Longevity: 82 mo
Battery Remaining Percentage: 78 %
Battery Voltage: 3.01 V
Brady Statistic AP VP Percent: 7.7 %
Brady Statistic AP VS Percent: 1 %
Brady Statistic AS VP Percent: 91 %
Brady Statistic AS VS Percent: 1 %
Brady Statistic RA Percent Paced: 9.1 %
Brady Statistic RV Percent Paced: 97 %
Date Time Interrogation Session: 20240224032059
Implantable Lead Connection Status: 753985
Implantable Lead Connection Status: 753985
Implantable Lead Implant Date: 20211124
Implantable Lead Implant Date: 20211124
Implantable Lead Location: 753859
Implantable Lead Location: 753860
Implantable Pulse Generator Implant Date: 20211124
Lead Channel Impedance Value: 440 Ohm
Lead Channel Impedance Value: 550 Ohm
Lead Channel Pacing Threshold Amplitude: 0.75 V
Lead Channel Pacing Threshold Amplitude: 1 V
Lead Channel Pacing Threshold Pulse Width: 0.5 ms
Lead Channel Pacing Threshold Pulse Width: 0.5 ms
Lead Channel Sensing Intrinsic Amplitude: 3.2 mV
Lead Channel Sensing Intrinsic Amplitude: 6.3 mV
Lead Channel Setting Pacing Amplitude: 2 V
Lead Channel Setting Pacing Amplitude: 2.5 V
Lead Channel Setting Pacing Pulse Width: 0.5 ms
Lead Channel Setting Sensing Sensitivity: 4 mV
Pulse Gen Model: 2272
Pulse Gen Serial Number: 3875505

## 2022-06-07 NOTE — Progress Notes (Signed)
Remote pacemaker transmission.   

## 2022-08-09 ENCOUNTER — Ambulatory Visit (INDEPENDENT_AMBULATORY_CARE_PROVIDER_SITE_OTHER): Payer: Medicare PPO

## 2022-08-09 DIAGNOSIS — I10 Essential (primary) hypertension: Secondary | ICD-10-CM | POA: Diagnosis not present

## 2022-08-09 DIAGNOSIS — N1832 Chronic kidney disease, stage 3b: Secondary | ICD-10-CM | POA: Diagnosis not present

## 2022-08-09 DIAGNOSIS — I442 Atrioventricular block, complete: Secondary | ICD-10-CM | POA: Diagnosis not present

## 2022-08-09 DIAGNOSIS — Z299 Encounter for prophylactic measures, unspecified: Secondary | ICD-10-CM | POA: Diagnosis not present

## 2022-08-09 DIAGNOSIS — I152 Hypertension secondary to endocrine disorders: Secondary | ICD-10-CM | POA: Diagnosis not present

## 2022-08-09 DIAGNOSIS — R0600 Dyspnea, unspecified: Secondary | ICD-10-CM | POA: Diagnosis not present

## 2022-08-09 DIAGNOSIS — E1159 Type 2 diabetes mellitus with other circulatory complications: Secondary | ICD-10-CM | POA: Diagnosis not present

## 2022-08-09 LAB — CUP PACEART REMOTE DEVICE CHECK
Battery Remaining Longevity: 79 mo
Battery Remaining Percentage: 75 %
Battery Voltage: 3.01 V
Brady Statistic AP VP Percent: 9 %
Brady Statistic AP VS Percent: 1 %
Brady Statistic AS VP Percent: 89 %
Brady Statistic AS VS Percent: 1 %
Brady Statistic RA Percent Paced: 9.8 %
Brady Statistic RV Percent Paced: 97 %
Date Time Interrogation Session: 20240522030105
Implantable Lead Connection Status: 753985
Implantable Lead Connection Status: 753985
Implantable Lead Implant Date: 20211124
Implantable Lead Implant Date: 20211124
Implantable Lead Location: 753859
Implantable Lead Location: 753860
Implantable Pulse Generator Implant Date: 20211124
Lead Channel Impedance Value: 440 Ohm
Lead Channel Impedance Value: 560 Ohm
Lead Channel Pacing Threshold Amplitude: 0.75 V
Lead Channel Pacing Threshold Amplitude: 1 V
Lead Channel Pacing Threshold Pulse Width: 0.5 ms
Lead Channel Pacing Threshold Pulse Width: 0.5 ms
Lead Channel Sensing Intrinsic Amplitude: 4.3 mV
Lead Channel Sensing Intrinsic Amplitude: 7.9 mV
Lead Channel Setting Pacing Amplitude: 2 V
Lead Channel Setting Pacing Amplitude: 2.5 V
Lead Channel Setting Pacing Pulse Width: 0.5 ms
Lead Channel Setting Sensing Sensitivity: 4 mV
Pulse Gen Model: 2272
Pulse Gen Serial Number: 3875505

## 2022-08-28 DIAGNOSIS — R0602 Shortness of breath: Secondary | ICD-10-CM | POA: Diagnosis not present

## 2022-08-30 DIAGNOSIS — Z87891 Personal history of nicotine dependence: Secondary | ICD-10-CM | POA: Diagnosis not present

## 2022-08-30 DIAGNOSIS — Z299 Encounter for prophylactic measures, unspecified: Secondary | ICD-10-CM | POA: Diagnosis not present

## 2022-08-30 DIAGNOSIS — Z7189 Other specified counseling: Secondary | ICD-10-CM | POA: Diagnosis not present

## 2022-08-30 DIAGNOSIS — Z1331 Encounter for screening for depression: Secondary | ICD-10-CM | POA: Diagnosis not present

## 2022-08-30 DIAGNOSIS — J449 Chronic obstructive pulmonary disease, unspecified: Secondary | ICD-10-CM | POA: Diagnosis not present

## 2022-08-30 DIAGNOSIS — I10 Essential (primary) hypertension: Secondary | ICD-10-CM | POA: Diagnosis not present

## 2022-08-30 DIAGNOSIS — Z0000 Encounter for general adult medical examination without abnormal findings: Secondary | ICD-10-CM | POA: Diagnosis not present

## 2022-08-30 DIAGNOSIS — I739 Peripheral vascular disease, unspecified: Secondary | ICD-10-CM | POA: Diagnosis not present

## 2022-08-30 DIAGNOSIS — Z1389 Encounter for screening for other disorder: Secondary | ICD-10-CM | POA: Diagnosis not present

## 2022-08-30 DIAGNOSIS — Z1339 Encounter for screening examination for other mental health and behavioral disorders: Secondary | ICD-10-CM | POA: Diagnosis not present

## 2022-08-30 NOTE — Progress Notes (Signed)
Remote pacemaker transmission.   

## 2022-09-22 ENCOUNTER — Ambulatory Visit: Payer: Medicare PPO | Attending: Cardiovascular Disease | Admitting: Cardiovascular Disease

## 2022-09-22 VITALS — BP 132/72 | HR 90 | Ht 73.0 in | Wt 203.4 lb

## 2022-09-22 DIAGNOSIS — I442 Atrioventricular block, complete: Secondary | ICD-10-CM

## 2022-09-22 DIAGNOSIS — R5383 Other fatigue: Secondary | ICD-10-CM

## 2022-09-22 DIAGNOSIS — R0602 Shortness of breath: Secondary | ICD-10-CM

## 2022-09-22 LAB — CUP PACEART INCLINIC DEVICE CHECK
Battery Remaining Longevity: 78 mo
Battery Voltage: 3.01 V
Brady Statistic RA Percent Paced: 9.6 %
Brady Statistic RV Percent Paced: 97 %
Date Time Interrogation Session: 20240705170358
Implantable Lead Connection Status: 753985
Implantable Lead Connection Status: 753985
Implantable Lead Implant Date: 20211124
Implantable Lead Implant Date: 20211124
Implantable Lead Location: 753859
Implantable Lead Location: 753860
Implantable Pulse Generator Implant Date: 20211124
Lead Channel Impedance Value: 450 Ohm
Lead Channel Impedance Value: 562.5 Ohm
Lead Channel Pacing Threshold Amplitude: 0.5 V
Lead Channel Pacing Threshold Amplitude: 0.5 V
Lead Channel Pacing Threshold Amplitude: 1.25 V
Lead Channel Pacing Threshold Amplitude: 1.25 V
Lead Channel Pacing Threshold Pulse Width: 0.5 ms
Lead Channel Pacing Threshold Pulse Width: 0.5 ms
Lead Channel Pacing Threshold Pulse Width: 0.5 ms
Lead Channel Pacing Threshold Pulse Width: 0.5 ms
Lead Channel Sensing Intrinsic Amplitude: 4.1 mV
Lead Channel Sensing Intrinsic Amplitude: 5.6 mV
Lead Channel Setting Pacing Amplitude: 2 V
Lead Channel Setting Pacing Amplitude: 2.5 V
Lead Channel Setting Pacing Pulse Width: 0.5 ms
Lead Channel Setting Sensing Sensitivity: 4 mV
Pulse Gen Model: 2272
Pulse Gen Serial Number: 3875505

## 2022-09-22 MED ORDER — APIXABAN 2.5 MG PO TABS: 2.5000 mg | ORAL_TABLET | Freq: Two times a day (BID) | ORAL | 6 refills | Status: DC

## 2022-09-22 MED ORDER — APIXABAN 2.5 MG PO TABS: 2.5000 mg | ORAL_TABLET | Freq: Two times a day (BID) | ORAL | 0 refills | Status: DC

## 2022-09-22 MED ORDER — APIXABAN 2.5 MG PO TABS: 2.5000 mg | ORAL_TABLET | Freq: Two times a day (BID) | ORAL | 3 refills | Status: AC

## 2022-09-22 NOTE — Patient Instructions (Addendum)
Medication Instructions:   Begin Eliquis 2.5mg  twice a day   Stop your Aspirin when you begin the Eliquis above Continue all other medications.     Labwork:  none  Testing/Procedures:  Your physician has requested that you have an echocardiogram. Echocardiography is a painless test that uses sound waves to create images of your heart. It provides your doctor with information about the size and shape of your heart and how well your heart's chambers and valves are working. This procedure takes approximately one hour. There are no restrictions for this procedure. Please do NOT wear cologne, perfume, aftershave, or lotions (deodorant is allowed). Please arrive 15 minutes prior to your appointment time.  Office will contact with results via phone, letter or mychart.     Follow-Up:  6 months   Any Other Special Instructions Will Be Listed Below (If Applicable).   If you need a refill on your cardiac medications before your next appointment, please call your pharmacy.

## 2022-09-22 NOTE — Progress Notes (Signed)
PCP: Hillis Range, MD (Inactive)   Primary EP:  Dr Nelly Laurence  Peter James. is a 89 y.o. male who presents today for routine electrophysiology followup.  Since last being seen in our clinic, the patient reports doing very well.  Today, he denies symptoms of palpitations, chest pain, shortness of breath,  lower extremity edema, dizziness, presyncope, or syncope.  The patient is otherwise without complaint today.   He is a veteran of the [place] [employer].  He has a history of AV block for which a Abbott dual-chamber pacemaker was placed in November 2021 by Dr. Johney Frame. he has no device related complaints -- no new tenderness, drainage, redness.  He has noted increased shortness of breath and fatigue recently.  Due to increasing burden of atrial fibrillation, Dr. Johney Frame prescribed Eliquis at last visit.  Unfortunately he has not been able to fill his prescription and continues to take aspirin.   ROS- all systems are reviewed and negative except as per HPI above  Current Outpatient Medications  Medication Sig Dispense Refill   albuterol (VENTOLIN HFA) 108 (90 Base) MCG/ACT inhaler Inhale 2 puffs into the lungs every 6 (six) hours as needed for wheezing or shortness of breath.     allopurinol (ZYLOPRIM) 300 MG tablet Take 300 mg by mouth daily.      Alogliptin Benzoate 12.5 MG TABS Take 6.25 mg by mouth daily.     amLODipine (NORVASC) 10 MG tablet Take 1 tablet (10 mg total) by mouth daily. 90 tablet 3   apixaban (ELIQUIS) 2.5 MG TABS tablet Take 1 tablet (2.5 mg total) by mouth 2 (two) times daily. 60 tablet 0   aspirin EC 81 MG tablet Take 81 mg by mouth daily. Swallow whole.     atorvastatin (LIPITOR) 10 MG tablet Take 1 tablet (10 mg total) by mouth every evening. 30 tablet 6   carvedilol (COREG) 3.125 MG tablet Take 3.125 mg by mouth 2 (two) times daily with a meal.     cholecalciferol (VITAMIN D3) 25 MCG (1000 UNIT) tablet Take 1,000 Units by mouth daily.     nortriptyline (PAMELOR)  10 MG capsule Take 10 mg by mouth at bedtime.      polyethylene glycol (MIRALAX / GLYCOLAX) 17 g packet Take 17 g by mouth daily as needed for mild constipation.     tamsulosin (FLOMAX) 0.4 MG CAPS capsule Take 0.4 mg by mouth daily.      No current facility-administered medications for this visit.    Physical Exam: Vitals:   09/22/22 1129  BP: 132/72  Pulse: 90  SpO2: 96%  Weight: 203 lb 6.4 oz (92.3 kg)  Height: 6\' 1"  (1.854 m)    Gen: Appears comfortable, well-nourished CV: RRR, no dependent edema The device site is normal -- no tenderness, edema, drainage, redness, threatened erosion. Pulm: breathing easily  Pacemaker interrogation- reviewed in detail today,  See PACEART report    Assessment and Plan:  1. Symptomatic complete heart block Normal pacemaker function See Pace Art report No changes today he is device dependant today  2. Afib Burden is 8.7 % (previously 6%) We discussed pros and cons of OAC therapy at length today.  Risks and benefits to eliquis were discussed.  The patient denies additional bleeding risks or falls.  We will resubmit the prescription to his pharmacy so that he can get the medication. Stop ASA Start eliquis 2.5mg  BID  3. Nonischemic CM/ chronic systolic dysfunction Increased shortness of breath recently -recheck echocardiogram No  change required today   Risks, benefits and potential toxicities for medications prescribed and/or refilled reviewed with patient today.   Return in 6 months  Maurice Small, MD 09/22/2022 12:00 PM

## 2022-09-26 ENCOUNTER — Ambulatory Visit: Payer: Medicare PPO | Attending: Cardiovascular Disease

## 2022-09-26 DIAGNOSIS — R0602 Shortness of breath: Secondary | ICD-10-CM

## 2022-09-26 DIAGNOSIS — R5383 Other fatigue: Secondary | ICD-10-CM

## 2022-09-27 LAB — ECHOCARDIOGRAM COMPLETE
AR max vel: 3.51 cm2
AV Area VTI: 3.32 cm2
AV Area mean vel: 3.7 cm2
AV Mean grad: 3 mmHg
AV Peak grad: 6.3 mmHg
Ao pk vel: 1.26 m/s
Area-P 1/2: 3.74 cm2
Calc EF: 43.1 %
MV VTI: 2.11 cm2
P 1/2 time: 478 msec
S' Lateral: 3.5 cm
Single Plane A2C EF: 47.9 %
Single Plane A4C EF: 39.3 %

## 2022-11-08 ENCOUNTER — Ambulatory Visit (INDEPENDENT_AMBULATORY_CARE_PROVIDER_SITE_OTHER): Payer: Medicare PPO

## 2022-11-08 DIAGNOSIS — I442 Atrioventricular block, complete: Secondary | ICD-10-CM | POA: Diagnosis not present

## 2022-11-08 LAB — CUP PACEART REMOTE DEVICE CHECK
Battery Remaining Longevity: 76 mo
Battery Remaining Percentage: 72 %
Battery Voltage: 3.01 V
Brady Statistic AP VP Percent: 5.5 %
Brady Statistic AP VS Percent: 1 %
Brady Statistic AS VP Percent: 91 %
Brady Statistic AS VS Percent: 1.2 %
Brady Statistic RA Percent Paced: 4.5 %
Brady Statistic RV Percent Paced: 96 %
Date Time Interrogation Session: 20240821182541
Implantable Lead Connection Status: 753985
Implantable Lead Connection Status: 753985
Implantable Lead Implant Date: 20211124
Implantable Lead Implant Date: 20211124
Implantable Lead Location: 753859
Implantable Lead Location: 753860
Implantable Pulse Generator Implant Date: 20211124
Lead Channel Impedance Value: 440 Ohm
Lead Channel Impedance Value: 530 Ohm
Lead Channel Pacing Threshold Amplitude: 0.5 V
Lead Channel Pacing Threshold Amplitude: 1.25 V
Lead Channel Pacing Threshold Pulse Width: 0.5 ms
Lead Channel Pacing Threshold Pulse Width: 0.5 ms
Lead Channel Sensing Intrinsic Amplitude: 3.9 mV
Lead Channel Sensing Intrinsic Amplitude: 5.2 mV
Lead Channel Setting Pacing Amplitude: 2 V
Lead Channel Setting Pacing Amplitude: 2.5 V
Lead Channel Setting Pacing Pulse Width: 0.5 ms
Lead Channel Setting Sensing Sensitivity: 4 mV
Pulse Gen Model: 2272
Pulse Gen Serial Number: 3875505

## 2022-11-21 NOTE — Progress Notes (Signed)
Remote pacemaker transmission.   

## 2022-11-24 ENCOUNTER — Encounter: Payer: Medicare PPO | Admitting: Cardiovascular Disease

## 2022-12-01 ENCOUNTER — Ambulatory Visit (INDEPENDENT_AMBULATORY_CARE_PROVIDER_SITE_OTHER): Payer: No Typology Code available for payment source | Admitting: Internal Medicine

## 2022-12-01 ENCOUNTER — Ambulatory Visit (HOSPITAL_COMMUNITY)
Admission: RE | Admit: 2022-12-01 | Discharge: 2022-12-01 | Disposition: A | Discharge status: Home / Self Care | Payer: No Typology Code available for payment source | Source: Ambulatory Visit | Attending: Internal Medicine | Admitting: Internal Medicine

## 2022-12-01 ENCOUNTER — Telehealth: Payer: Self-pay | Admitting: Internal Medicine

## 2022-12-01 ENCOUNTER — Encounter: Payer: Self-pay | Admitting: Internal Medicine

## 2022-12-01 VITALS — BP 115/71 | HR 95 | Ht 73.0 in | Wt 208.4 lb

## 2022-12-01 DIAGNOSIS — R0609 Other forms of dyspnea: Secondary | ICD-10-CM | POA: Diagnosis not present

## 2022-12-01 DIAGNOSIS — R9389 Abnormal findings on diagnostic imaging of other specified body structures: Secondary | ICD-10-CM | POA: Diagnosis not present

## 2022-12-01 DIAGNOSIS — Z950 Presence of cardiac pacemaker: Secondary | ICD-10-CM | POA: Diagnosis not present

## 2022-12-01 DIAGNOSIS — R918 Other nonspecific abnormal finding of lung field: Secondary | ICD-10-CM | POA: Diagnosis not present

## 2022-12-01 MED ORDER — ALBUTEROL SULFATE HFA 108 (90 BASE) MCG/ACT IN AERS: 2.0000 | INHALATION_SPRAY | Freq: Four times a day (QID) | RESPIRATORY_TRACT | 1 refills | Status: AC | PRN

## 2022-12-01 MED ORDER — PANTOPRAZOLE SODIUM 40 MG PO TBEC
40.0000 mg | DELAYED_RELEASE_TABLET | Freq: Every day | ORAL | 2 refills | Status: AC
Start: 2022-12-01 — End: ?

## 2022-12-01 MED ORDER — FAMOTIDINE 20 MG PO TABS
ORAL_TABLET | ORAL | 11 refills | Status: AC
Start: 2022-12-01 — End: ?

## 2022-12-01 NOTE — Progress Notes (Unsigned)
Peter James., male    DOB: April 08, 1925    MRN: [id number]   Brief patient profile:  89  yobm quit 174  referred to pulmonary clinic in [place]  [facility]       History of Present Illness  12/01/2022  Pulmonary/ 1st office eval/ Sylvestre Rathgeber / [facility]  Chief Complaint  Patient presents with   Consult    Sob  w/ active   Dyspnea:  walking with cane due to R hip pain, says  breathing difficulty walking to car = 25-50 ft  Cough: none  Sleep: flat bed/ one pillow  SABA use: no better  02: none   No obvious day to day or daytime pattern/variability or assoc excess/ purulent sputum or mucus plugs or hemoptysis or cp or chest tightness, subjective wheeze or overt sinus or hb symptoms.    Also denies any obvious fluctuation of symptoms with weather or environmental changes or other aggravating or alleviating factors except as outlined above   No unusual exposure hx or h/o childhood pna/ asthma or knowledge of premature birth.  Current Allergies, Complete Past Medical History, Past Surgical History, Family History, and Social History were reviewed in [employer] record.  ROS  The following are not active complaints unless bolded Hoarseness, sore throat, dysphagia, dental problems, itching, sneezing,  nasal congestion or discharge of excess mucus or purulent secretions, ear ache,   fever, chills, sweats, unintended wt loss or wt gain, classically pleuritic or exertional cp,  orthopnea pnd or arm/hand swelling  or leg swelling, presyncope, palpitations, abdominal pain, anorexia, nausea, vomiting, diarrhea  or change in bowel habits or change in bladder habits, change in stools or change in urine, dysuria, hematuria,  rash, arthralgias, visual complaints, headache, numbness, weakness or ataxia or problems with walking/uses cane  or coordination,  change in mood or  memory.              Outpatient Medications Prior to Visit   Medication Sig Dispense Refill   albuterol (VENTOLIN HFA) 108 (90 Base) MCG/ACT inhaler Inhale 2 puffs into the lungs every 6 (six) hours as needed for wheezing or shortness of breath.     allopurinol (ZYLOPRIM) 300 MG tablet Take 300 mg by mouth daily.      Alogliptin Benzoate 12.5 MG TABS Take 6.25 mg by mouth daily.     amLODipine (NORVASC) 10 MG tablet Take 1 tablet (10 mg total) by mouth daily. 90 tablet 3   apixaban (ELIQUIS) 2.5 MG TABS tablet Take 1 tablet (2.5 mg total) by mouth 2 (two) times daily. 180 tablet 3   atorvastatin (LIPITOR) 10 MG tablet Take 1 tablet (10 mg total) by mouth every evening. 30 tablet 6   carvedilol (COREG) 3.125 MG tablet Take 3.125 mg by mouth 2 (two) times daily with a meal.     cholecalciferol (VITAMIN D3) 25 MCG (1000 UNIT) tablet Take 1,000 Units by mouth daily.     nortriptyline (PAMELOR) 10 MG capsule Take 10 mg by mouth at bedtime.      polyethylene glycol (MIRALAX / GLYCOLAX) 17 g packet Take 17 g by mouth daily as needed for mild constipation.     tamsulosin (FLOMAX) 0.4 MG CAPS capsule Take 0.4 mg by mouth daily.      No facility-administered medications prior to visit.    Past Medical History:  Diagnosis Date   Acute lower GI bleeding    Recurrent: 2011, 2012  Bladder cancer ([name])    Transitional cell, 2001   Bradycardia 01/20/2020   CAD (coronary atherosclerotic disease)    Nonobstructive, 05/2012   CHF (congestive heart failure) ([name])    Exertional dyspnea    EF 70-75%, grade 1 diastolic dysfunction, 05/2012   HLD (hyperlipidemia)    HTN (hypertension)    Neuropathy    Other insomnia    PAD (peripheral artery disease) ([name])    ABIs, 2009: 0.89 right; 0.99 left   Type 2 diabetes mellitus ([name])    Valvular heart disease    Vertigo       Objective:     BP 115/71   Pulse 95   Ht 6\' 1"  (1.854 m)   Wt 208 lb 6.4 oz (94.5 kg)   SpO2 94% Comment: RA  BMI 27.50 kg/m   SpO2: 94 % (RA)  Very pleasant amb bm much < stated age   trace exp wheeze    HEENT : Oropharynx  clear/ top denture/ lower partial     Nasal turbinates nl    NECK :  without  apparent JVD/ palpable Nodes/TM    LUNGS: no acc muscle use,  Nl contour chest with trace exp wheeze bilaterally  without cough on insp or exp maneuvers/ reduced bs L > R bae   CV:  RRR  no s3 or murmur or increase in P2, and no edema   ABD:  obese soft and nontender with nl inspiratory excursion in the supine position. No bruits or organomegaly appreciated   MS:  Nl gait/ ext warm without deformities Or obvious joint restrictions  calf tenderness, cyanosis or clubbing    SKIN: warm and dry without lesions    NEURO:  alert, approp, nl sensorium with  no motor or cerebellar deficits apparent.     Labs ordered/ reviewed:      Chemistry      Component Value Date/Time   NA 135 12/01/2022 0959   K 4.5 12/01/2022 0959   CL 99 12/01/2022 0959   CO2 24 12/01/2022 0959   BUN 25 12/01/2022 0959   CREATININE 1.51 (H) 12/01/2022 0959      Component Value Date/Time   CALCIUM 9.7 12/01/2022 0959   ALKPHOS 58 10/03/2019 0342   AST 28 10/03/2019 0342   ALT 23 10/03/2019 0342   BILITOT 0.2 (L) 10/03/2019 0342        Lab Results  Component Value Date   WBC 6.1 12/01/2022   HGB 13.7 12/01/2022   HCT 41.6 12/01/2022   MCV 96 12/01/2022   PLT 272 12/01/2022     Lab Results  Component Value Date   DDIMER 0.57 (H) 12/01/2022      Lab Results  Component Value Date   TSH 3.030 12/01/2022     BNP    12/01/2022   73    Lab Results  Component Value Date   ESRSEDRATE 27 12/01/2022       CXR PA and Lateral:   12/01/2022 :    I personally reviewed images and impression is as follows:     Air under R HD on pa view looks like intestinal air on lateral anteriorly ? Kalididi's syndrome / L HD elevation     Assessment   Exertional dyspnea Onset 2022  but much worse since spring 2024  - Elevation of L HD dating back to at least 2012  - Echo7/9/24 EF 40-45%  with mild AI but normal LA - 12/01/2022   Walked on RA  x  1  lap(s) =  approx 150  ft  @ slow/cane  pace, stopped due to sob/ fatigue with lowest 02 sats 98%  - Allergy screen 12/01/2022 >  Eos 0. 1/  IgE  139   Symptoms are markedly disproportionate to objective findings and not clear to what extent this is actually a pulmonary  problem but pt does appear to have difficult to sort out respiratory symptoms of unknown origin for which  DDX  = almost all start with A and  include Adherence, Ace Inhibitors, Acid Reflux, Active Sinus Disease, Alpha 1 Antitripsin deficiency, Anxiety masquerading as Airways dz,  ABPA,  Allergy(esp in young), Aspiration (esp in elderly), Adverse effects of meds   Active smoking or Vaping, A bunch of PE's/clot burden (a few small clots can't cause this syndrome unless there is already severe underlying pulm or vascular dz with poor reserve),  Anemia or thyroid disorder, plus two Bs  = Bronchiectasis and Beta blocker use..and one C= CHF    Most likely ddx's bolded   Adherence is always the initial "prime suspect" and is a multilayered concern that requires a "trust but verify" approach in every patient - starting with knowing how to use medications, especially inhalers, correctly, keeping up with refills and understanding the fundamental difference between maintenance and prns vs those medications only taken for a very short course and then stopped and not refilled.  - instructed on optimal use of saba - return with all meds in hand using a trust but verify approach to confirm accurate Medication  Reconciliation The principal here is that until we are certain that the  patients are doing what we've asked, it makes no sense to ask them to do more.    ? Acid (or non-acid) GERD > always difficult to exclude as up to 75% of pts in some series report no assoc GI/ Heartburn symptoms> rec max (24h)  acid suppression and diet restrictions/ reviewed and instructions given in writing.    ? Allergy/ asthma >  doubt this is late onset asthma (though IgE up a bit) >> approp saba only for now  Re SABA :  I spent extra time with pt today reviewing appropriate use of albuterol for prn use on exertion with the following points: 1) saba is for relief of sob that does not improve by walking a slower pace or resting but rather if the pt does not improve after trying this first. 2) If the pt is convinced, as many are, that saba helps recover from activity faster then it's easy to tell if this is the case by re-challenging : ie stop, take the inhaler, then p 5 minutes try the exact same activity (intensity of workload) that just caused the symptoms and see if they are substantially diminished or not after saba 3) if there is an activity that reproducibly causes the symptoms, try the saba 15 min before the activity on alternate days   If in fact the saba really does help, then fine to continue to use it prn but advised may need to look closer at the maintenance regimen being used to achieve better control of airways disease with exertion.   ?  A bunch of PEs > supposed to be on eliquis low dose but could not verify > bring meds next time and check baseline ddimer in case not taking or taking too little of a dose > D dimer  or high normal value (seen commonly in the elderly or  chronically ill)  may miss small peripheral pe, the clot burden with sob is moderately high and the d dimer in this case and use of eliquis in any dose >> a very high neg pred value / PE very unliley   ? Anemia/ thyroid dz>  ruled out today   ? BB effects > usually not seen with low dose coreg   ? 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 Infiltrates/atx on R pa view (lateral view now helpful due to L HD elevation) so we'll just do another cxr at next ov given chronicity of symtoms and  lack of an acute issue (no cough or leukocytosis or other signs of pna or infection of any kind)  Each maintenance medication was reviewed in detail including emphasizing most importantly the difference between maintenance and prns and under what circumstances the prns are to be triggered using an action plan format where appropriate.  Total time for H and P, chart review, counseling, reviewing hfa device(s) , directly observing portions of ambulatory 02 saturation study/ and generating customized AVS unique to this office visit / same day charting > 60 min for  new pt with  refractory respiratory  symptoms of uncertain etiology                    Sandrea Hughs, MD 12/01/2022

## 2022-12-01 NOTE — Telephone Encounter (Signed)
Tiffany calling with call report. For stat xray. Tiffany phone number is [phone].

## 2022-12-01 NOTE — Patient Instructions (Addendum)
Stop the powder inhaler and Try to  use your albuterol inhaler as a rescue medication to be used if you can't catch your breath by resting or doing a relaxed purse lip breathing pattern.  - The less you use it, the better it will work when you need it. - Ok to use up to 2 puffs  every 4 hours if you must but call for immediate appointment if use goes up over your usual need - Don't leave home without it !!  (think of it like starter fluid for a car engine)   Also  Ok to try albuterol x 2 puffs x 15 min before an activity (on alternating days)  that you know would usually make you short of breath and see if it makes any difference and if makes none then don't take albuterol after activity unless you can't catch your breath as this means it's the resting that helps, not the albuterol.  Please remember to go to the lab department   for your tests - we will call you with the results when they are available.      Please remember to go to the  x-ray department  @  [facility] for your tests - we will call you with the results when they are available     Please schedule a follow up office visit in 4 weeks, sooner if needed  with all medications /inhalers/ solutions in hand so we can verify exactly what you are taking. This includes all medications from all doctors and over the counters

## 2022-12-01 NOTE — Telephone Encounter (Signed)
Call report for patient per Veronda Prude., Report in Epic , Impression  2. Air underneath the right hemidiaphragm. Free air is not excluded. Recommend further evaluation with abdominal series.

## 2022-12-01 NOTE — Assessment & Plan Note (Signed)
Onset 2022  but much worse since spring 2024  - Elevation of L HD dating back to at least 2012  - Echo7/9/24 EF 40-45% with mild AI but normal LA - 12/01/2022   Walked on RA  x  1  lap(s) =  approx 150  ft  @ slow/cane  pace, stopped due to sob/ fatigue with lowest 02 sats 98%  - Allergy screen 12/01/2022 >  Eos 0. 1/  IgE  139   Symptoms are markedly disproportionate to objective findings and not clear to what extent this is actually a pulmonary  problem but pt does appear to have difficult to sort out respiratory symptoms of unknown origin for which  DDX  = almost all start with A and  include Adherence, Ace Inhibitors, Acid Reflux, Active Sinus Disease, Alpha 1 Antitripsin deficiency, Anxiety masquerading as Airways dz,  ABPA,  Allergy(esp in young), Aspiration (esp in elderly), Adverse effects of meds   Active smoking or Vaping, A bunch of PE's/clot burden (a few small clots can't cause this syndrome unless there is already severe underlying pulm or vascular dz with poor reserve),  Anemia or thyroid disorder, plus two Bs  = Bronchiectasis and Beta blocker use..and one C= CHF    Most likely ddx's bolded   Adherence is always the initial "prime suspect" and is a multilayered concern that requires a "trust but verify" approach in every patient - starting with knowing how to use medications, especially inhalers, correctly, keeping up with refills and understanding the fundamental difference between maintenance and prns vs those medications only taken for a very short course and then stopped and not refilled.  - instructed on optimal use of saba - return with all meds in hand using a trust but verify approach to confirm accurate Medication  Reconciliation The principal here is that until we are certain that the  patients are doing what we've asked, it makes no sense to ask them to do more.    ? Acid (or non-acid) GERD > always difficult to exclude as up to 75% of pts in some series report no assoc GI/  Heartburn symptoms> rec max (24h)  acid suppression and diet restrictions/ reviewed and instructions given in writing.   ? Allergy/ asthma >  doubt this is late onset asthma (though IgE up a bit) >> approp saba only for now  Re SABA :  I spent extra time with pt today reviewing appropriate use of albuterol for prn use on exertion with the following points: 1) saba is for relief of sob that does not improve by walking a slower pace or resting but rather if the pt does not improve after trying this first. 2) If the pt is convinced, as many are, that saba helps recover from activity faster then it's easy to tell if this is the case by re-challenging : ie stop, take the inhaler, then p 5 minutes try the exact same activity (intensity of workload) that just caused the symptoms and see if they are substantially diminished or not after saba 3) if there is an activity that reproducibly causes the symptoms, try the saba 15 min before the activity on alternate days   If in fact the saba really does help, then fine to continue to use it prn but advised may need to look closer at the maintenance regimen being used to achieve better control of airways disease with exertion.   ?  A bunch of PEs > supposed to be on eliquis  low dose but could not verify > bring meds next time and check baseline ddimer in case not taking or taking too little of a dose > D dimer  or high normal value (seen commonly in the elderly or chronically ill)  may miss small peripheral pe, the clot burden with sob is moderately high and the d dimer in this case and use of eliquis in any dose >> a very high neg pred value / PE very unliley   ? Anemia/ thyroid dz>  ruled out today   ? BB effects > usually not seen with low dose coreg   ? 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 Infiltrates/atx on R pa view  (lateral view now helpful due to L HD elevation) so we'll just do another cxr at next ov given chronicity of symtoms and lack of an acute issue (no cough or leukocytosis or other signs of pna or infection of any kind)  Each maintenance medication was reviewed in detail including emphasizing most importantly the difference between maintenance and prns and under what circumstances the prns are to be triggered using an action plan format where appropriate.  Total time for H and P, chart review, counseling, reviewing hfa device(s) , directly observing portions of ambulatory 02 saturation study/ and generating customized AVS unique to this office visit / same day charting > 60 min for  new pt with  refractory respiratory  symptoms of uncertain etiology

## 2022-12-03 LAB — CBC WITH DIFFERENTIAL/PLATELET
Basophils Absolute: 0 10*3/uL (ref 0.0–0.2)
Basos: 1 %
EOS (ABSOLUTE): 0.1 10*3/uL (ref 0.0–0.4)
Eos: 2 %
Hematocrit: 41.6 % (ref 37.5–51.0)
Hemoglobin: 13.7 g/dL (ref 13.0–17.7)
Immature Grans (Abs): 0 10*3/uL (ref 0.0–0.1)
Immature Granulocytes: 1 %
Lymphocytes Absolute: 1.7 10*3/uL (ref 0.7–3.1)
Lymphs: 28 %
MCH: 31.7 pg (ref 26.6–33.0)
MCHC: 32.9 g/dL (ref 31.5–35.7)
MCV: 96 fL (ref 79–97)
Monocytes Absolute: 0.5 10*3/uL (ref 0.1–0.9)
Monocytes: 8 %
Neutrophils Absolute: 3.7 10*3/uL (ref 1.4–7.0)
Neutrophils: 60 %
Platelets: 272 10*3/uL (ref 150–450)
RBC: 4.32 x10E6/uL (ref 4.14–5.80)
RDW: 13.8 % (ref 11.6–15.4)
WBC: 6.1 10*3/uL (ref 3.4–10.8)

## 2022-12-03 LAB — BASIC METABOLIC PANEL
BUN/Creatinine Ratio: 17 (ref 10–24)
BUN: 25 mg/dL (ref 10–36)
CO2: 24 mmol/L (ref 20–29)
Calcium: 9.7 mg/dL (ref 8.6–10.2)
Chloride: 99 mmol/L (ref 96–106)
Creatinine, Ser: 1.51 mg/dL — ABNORMAL HIGH (ref 0.76–1.27)
Glucose: 142 mg/dL — ABNORMAL HIGH (ref 70–99)
Potassium: 4.5 mmol/L (ref 3.5–5.2)
Sodium: 135 mmol/L (ref 134–144)
eGFR: 42 mL/min/{1.73_m2} — ABNORMAL LOW (ref >59)

## 2022-12-03 LAB — BRAIN NATRIURETIC PEPTIDE: BNP: 72.9 pg/mL (ref 0.0–100.0)

## 2022-12-03 LAB — SEDIMENTATION RATE: Sed Rate: 27 mm/h (ref 0–30)

## 2022-12-03 LAB — D-DIMER, QUANTITATIVE: D-DIMER: 0.57 mg{FEU}/L — ABNORMAL HIGH (ref 0.00–0.49)

## 2022-12-03 LAB — TSH: TSH: 3.03 u[IU]/mL (ref 0.450–4.500)

## 2022-12-03 LAB — IGE: IgE (Immunoglobulin E), Serum: 139 [IU]/mL (ref 6–495)

## 2022-12-04 ENCOUNTER — Encounter: Payer: Self-pay | Admitting: Internal Medicine

## 2022-12-04 NOTE — Telephone Encounter (Signed)
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

## 2022-12-07 DIAGNOSIS — F3342 Major depressive disorder, recurrent, in full remission: Secondary | ICD-10-CM | POA: Diagnosis not present

## 2022-12-07 DIAGNOSIS — Z299 Encounter for prophylactic measures, unspecified: Secondary | ICD-10-CM | POA: Diagnosis not present

## 2022-12-07 DIAGNOSIS — E1159 Type 2 diabetes mellitus with other circulatory complications: Secondary | ICD-10-CM | POA: Diagnosis not present

## 2022-12-07 DIAGNOSIS — I152 Hypertension secondary to endocrine disorders: Secondary | ICD-10-CM | POA: Diagnosis not present

## 2022-12-07 DIAGNOSIS — I10 Essential (primary) hypertension: Secondary | ICD-10-CM | POA: Diagnosis not present

## 2022-12-07 DIAGNOSIS — J449 Chronic obstructive pulmonary disease, unspecified: Secondary | ICD-10-CM | POA: Diagnosis not present

## 2022-12-08 ENCOUNTER — Telehealth: Payer: Self-pay

## 2022-12-13 NOTE — Telephone Encounter (Signed)
Error

## 2022-12-19 ENCOUNTER — Institutional Professional Consult (permissible substitution): Payer: Medicare PPO | Admitting: Internal Medicine

## 2022-12-28 ENCOUNTER — Ambulatory Visit: Payer: Medicare PPO | Admitting: Cardiology

## 2022-12-29 ENCOUNTER — Ambulatory Visit (INDEPENDENT_AMBULATORY_CARE_PROVIDER_SITE_OTHER): Payer: Non-veteran care | Admitting: Internal Medicine

## 2022-12-29 ENCOUNTER — Encounter: Payer: Self-pay | Admitting: Internal Medicine

## 2022-12-29 ENCOUNTER — Ambulatory Visit (HOSPITAL_COMMUNITY)
Admission: RE | Admit: 2022-12-29 | Discharge: 2022-12-29 | Disposition: A | Discharge status: Home / Self Care | Payer: Non-veteran care | Source: Ambulatory Visit | Attending: Internal Medicine | Admitting: Internal Medicine

## 2022-12-29 VITALS — BP 103/62 | HR 101 | Ht 73.0 in | Wt 204.8 lb

## 2022-12-29 DIAGNOSIS — R0609 Other forms of dyspnea: Secondary | ICD-10-CM | POA: Insufficient documentation

## 2022-12-29 NOTE — Patient Instructions (Addendum)
Pantoprazole (protonix) 40 mg   Take  30-60 min before first meal of the day and Pepcid (famotidine)  20 mg after supper until return to office - this is the best way to tell whether stomach acid is contributing to your problem.    Only use your albuterol as a rescue medication to be used if you can't catch your breath by resting or doing a relaxed purse lip breathing pattern.  - The less you use it, the better it will work when you need it. - Ok to use up to 2 puffs  every 4 hours if you must but call for immediate appointment if use goes up over your usual need - Don't leave home without it !!  (think of it like the starter fluid)    Also  Ok to try albuterol 15 min before an activity (on alternating days)  that you know would usually make you short of breath and see if it makes any difference and if makes none then don't take albuterol after activity unless you can't catch your breath as this means it's the resting that helps, not the albuterol.   Work on inhaler technique:  relax and gently blow all the way out then take a nice smooth full deep breath back in, triggering the inhaler at same time you start breathing in.  Hold breath in for at least  5 seconds if you can.       Please remember to go to the  x-ray department  @  [facility] for your tests - we will call you with the results when they are available     Please schedule a follow up visit in 3 months but call sooner if needed

## 2022-12-29 NOTE — Progress Notes (Signed)
Merri Brunette., male    DOB: 11/20/1925    MRN: [id number]   Brief patient profile:  89  yobm quit smoking 1974  referred to pulmonary clinic in [place]  [facility]    History of Present Illness  12/01/2022  Pulmonary/ 1st office eval/ Cami Delawder / [place] [facility]  Chief Complaint  Patient presents with   Consult    Sob  w/ active   Dyspnea:  walking with cane due to R hip pain, says  breathing difficulty walking to car = 25-50 ft  Cough: none  Sleep: flat bed/ one pillow  SABA use: no better  02: none   Rec Stop the powder inhaler and Try to  use your albuterol inhaler as a rescue medication to be used if you can't catch your breath  Also  Ok to try albuterol x 2 puffs x 15 min before an activity (on alternating days)  that you know would usually make you short of breath  Please schedule a follow up office visit in 4 weeks, sooner if needed  with all medications /inhalers/ solutions in hand      12/29/2022  f/u ov/[place] office/Marrion Finan re: doe /  severe chronic LHD elevation maint on prn saba / did not try saba, did not bring meds  Chief Complaint  Patient presents with   Follow-up  Dyspnea:  no change in doe / very sedentary  Cough: none  Sleeping: flat bed one pillow  s  resp cc  SABA use: too much and they still don't work  02: none    No obvious day to day or daytime variability or assoc excess/ purulent sputum or mucus plugs or hemoptysis or cp or chest tightness, subjective wheeze or overt sinus or hb symptoms.    Also denies any obvious fluctuation of symptoms with weather or environmental changes or other aggravating or alleviating factors except as outlined above   No unusual exposure hx or h/o childhood pna/ asthma or knowledge of premature birth.  Current Allergies, Complete Past Medical History, Past Surgical History, Family History, and Social History were reviewed in [employer] record.  ROS  The  following are not active complaints unless bolded Hoarseness, sore throat, dysphagia, dental problems, itching, sneezing,  nasal congestion or discharge of excess mucus or purulent secretions, ear ache,   fever, chills, sweats, unintended wt loss or wt gain, classically pleuritic or exertional cp,  orthopnea pnd or arm/hand swelling  or leg swelling, presyncope, palpitations, abdominal pain, anorexia, nausea, vomiting, diarrhea  or change in bowel habits or change in bladder habits, change in stools or change in urine, dysuria, hematuria,  rash, arthralgias, visual complaints, headache, numbness, weakness or ataxia or problems with walking or coordination,  change in mood or  memory.        Current Meds  Medication Sig   albuterol (VENTOLIN HFA) 108 (90 Base) MCG/ACT inhaler Inhale 2 puffs into the lungs every 6 (six) hours as needed for wheezing or shortness of breath.   allopurinol (ZYLOPRIM) 300 MG tablet Take 300 mg by mouth daily.    Alogliptin Benzoate 12.5 MG TABS Take 6.25 mg by mouth daily.   amLODipine (NORVASC) 10 MG tablet Take 1 tablet (10 mg total) by mouth daily.   apixaban (ELIQUIS) 2.5 MG TABS tablet Take 1 tablet (2.5 mg total) by mouth 2 (two) times daily.   atorvastatin (LIPITOR) 10 MG tablet Take 1 tablet (10 mg total) by  mouth every evening.   carvedilol (COREG) 3.125 MG tablet Take 3.125 mg by mouth 2 (two) times daily with a meal.   cholecalciferol (VITAMIN D3) 25 MCG (1000 UNIT) tablet Take 1,000 Units by mouth daily.   famotidine (PEPCID) 20 MG tablet One after supper   nortriptyline (PAMELOR) 10 MG capsule Take 10 mg by mouth at bedtime.    pantoprazole (PROTONIX) 40 MG tablet Take 1 tablet (40 mg total) by mouth daily. Take 30-60 min before first meal of the day   polyethylene glycol (MIRALAX / GLYCOLAX) 17 g packet Take 17 g by mouth daily as needed for mild constipation.   tamsulosin (FLOMAX) 0.4 MG CAPS capsule Take 0.4 mg by mouth daily.         Past Medical  History:  Diagnosis Date   Acute lower GI bleeding    Recurrent: 2011, 2012   Bladder cancer ([name])    Transitional cell, 2001   Bradycardia 01/20/2020   CAD (coronary atherosclerotic disease)    Nonobstructive, 05/2012   CHF (congestive heart failure) ([name])    Exertional dyspnea    EF 70-75%, grade 1 diastolic dysfunction, 05/2012   HLD (hyperlipidemia)    HTN (hypertension)    Neuropathy    Other insomnia    PAD (peripheral artery disease) ([name])    ABIs, 2009: 0.89 right; 0.99 left   Type 2 diabetes mellitus ([name])    Valvular heart disease    Vertigo       Objective:     Wt Readings from Last 3 Encounters:  12/29/22 204 lb 12.8 oz (92.9 kg)  12/01/22 208 lb 6.4 oz (94.5 kg)  09/22/22 203 lb 6.4 oz (92.3 kg)     Vital signs reviewed  12/29/2022  - Note at rest 02 sats  94% on RA   General appearance:    amb pleasant bm nad       HEENT : Oropharynx  clear / top denture/ lower partial        NECK :  without  apparent JVD/ palpable Nodes/TM    LUNGS: no acc muscle use,  Nl contour chest with decreased bs bases    CV:  RRR  no s3 or murmur or increase in P2, and no edema   ABD:  soft and nontender  MS:  Nl gait/ ext warm without deformities Or obvious joint restrictions  calf tenderness, cyanosis or clubbing    SKIN: warm and dry without lesions    NEURO:  alert, approp, nl sensorium with  no motor or cerebellar deficits apparent.       Assessment

## 2022-12-31 ENCOUNTER — Encounter: Payer: Self-pay | Admitting: Internal Medicine

## 2022-12-31 NOTE — Assessment & Plan Note (Addendum)
Onset 2022  but much worse since spring 2024  - Elevation of L HD dating back to at least 2012  - Echo 09/26/22 EF 40-45% with mild AI but normal LA - 12/01/2022   Walked on RA  x  1  lap(s) =  approx 150  ft  @ slow/cane  pace, stopped due to sob/ fatigue with lowest 02 sats 98%  - Allergy screen 12/01/2022 >  Eos 0. 1/  IgE  139  -  added empirical gerd rx 12/29/2022 >>>     12/29/2022  After extensive coaching inhaler device,  effectiveness =  50% (short ti)   Not certain he has asthma at tall but rec more approp saba than he has in the past just in case   Re SABA :  I spent extra time with pt today reviewing appropriate use of albuterol for prn use on exertion with the following points: 1) saba is for relief of sob that does not improve by walking a slower pace or resting but rather if the pt does not improve after trying this first. 2) If the pt is convinced, as many are, that saba helps recover from activity faster then it's easy to tell if this is the case by re-challenging : ie stop, take the inhaler, then p 5 minutes try the exact same activity (intensity of workload) that just caused the symptoms and see if they are substantially diminished or not after saba 3) if there is an activity that reproducibly causes the symptoms, try the saba 15 min before the activity on alternate days   If in fact the saba really does help, then fine to continue to use it prn but advised may need to look closer at the maintenance regimen being used to achieve better control of airways disease with exertion.   >>> Also added empirical gerd rx: as  always difficult to exclude GERD as up to 75% of pts with atypical airway complaints do prove to have GERD  in some series and yet report no assoc GI/ Heartburn symptoms> rec max (24h)  acid suppression and diet restrictions/ reviewed and instructions given in writing.    F/u 3 m, sooner if needed           Each maintenance medication was reviewed in detail  including emphasizing most importantly the difference between maintenance and prns and under what circumstances the prns are to be triggered using an action plan format where appropriate.  Total time for H and P, chart review, counseling, reviewing hfa device(s) and generating customized AVS unique to this office visit / same day charting = 32 min

## 2023-01-10 DIAGNOSIS — M25551 Pain in right hip: Secondary | ICD-10-CM | POA: Diagnosis not present

## 2023-01-10 DIAGNOSIS — I10 Essential (primary) hypertension: Secondary | ICD-10-CM | POA: Diagnosis not present

## 2023-01-10 DIAGNOSIS — Z299 Encounter for prophylactic measures, unspecified: Secondary | ICD-10-CM | POA: Diagnosis not present

## 2023-01-23 ENCOUNTER — Ambulatory Visit: Payer: Medicare PPO | Admitting: Nurse Practitioner

## 2023-01-23 NOTE — Progress Notes (Deleted)
  Cardiology Office Note:  .   Date:  01/23/2023  ID:  Peter James., DOB Jun 12, 1925, MRN [id number] PCP: Ignatius Specking, MD  [facility] Providers Cardiologist:  None { Click to update primary MD,subspecialty MD or APP then REFRESH:1}   History of Present Illness: .   Peter James. is a 89 y.o. male with a PMH of symptomatic complete heart block, s/p PPM in 2021, A-fib, NICM/Chronic systolic CHF, T2DM, HLD, who presents today for evaluation for DOE.   Last seen by Dr. Nelly Laurence on September 22, 2022.  Patient had noted increased shortness of breath and fatigue recently.  He had not been able to fill his prescription for Eliquis and continues to take aspirin.  Aspirin was discontinued and started on Eliquis 2.5 mg twice daily.  Echocardiogram was arranged-see report below.  Today he presents for DOE evaluation.  He states....   ROS: ***  Studies Reviewed: .        *** Risk Assessment/Calculations:   {Does this patient have ATRIAL FIBRILLATION?:[id number]} No BP recorded.  {Refresh Note OR Click here to enter BP  :1}***       Physical Exam:   VS:  There were no vitals taken for this visit.   Wt Readings from Last 3 Encounters:  12/29/22 204 lb 12.8 oz (92.9 kg)  12/01/22 208 lb 6.4 oz (94.5 kg)  09/22/22 203 lb 6.4 oz (92.3 kg)    GEN: Well nourished, well developed in no acute distress NECK: No JVD; No carotid bruits CARDIAC: ***RRR, no murmurs, rubs, gallops RESPIRATORY:  Clear to auscultation without rales, wheezing or rhonchi  ABDOMEN: Soft, non-tender, non-distended EXTREMITIES:  No edema; No deformity   ASSESSMENT AND PLAN: .   ***    {Are you ordering a CV Procedure (e.g. stress test, cath, DCCV, TEE, etc)?   Press F2        :[id number]}  Dispo: ***  Signed, Sharlene Dory, NP

## 2023-02-07 ENCOUNTER — Ambulatory Visit (INDEPENDENT_AMBULATORY_CARE_PROVIDER_SITE_OTHER): Payer: Medicare PPO

## 2023-02-07 DIAGNOSIS — I442 Atrioventricular block, complete: Secondary | ICD-10-CM

## 2023-02-08 LAB — CUP PACEART REMOTE DEVICE CHECK
Battery Remaining Longevity: 72 mo
Battery Remaining Percentage: 70 %
Battery Voltage: 3.01 V
Brady Statistic AP VP Percent: 6.7 %
Brady Statistic AP VS Percent: 1 %
Brady Statistic AS VP Percent: 90 %
Brady Statistic AS VS Percent: 1 %
Brady Statistic RA Percent Paced: 6 %
Brady Statistic RV Percent Paced: 95 %
Date Time Interrogation Session: 20241120072359
Implantable Lead Connection Status: 753985
Implantable Lead Connection Status: 753985
Implantable Lead Implant Date: 20211124
Implantable Lead Implant Date: 20211124
Implantable Lead Location: 753859
Implantable Lead Location: 753860
Implantable Pulse Generator Implant Date: 20211124
Lead Channel Impedance Value: 440 Ohm
Lead Channel Impedance Value: 510 Ohm
Lead Channel Pacing Threshold Amplitude: 0.5 V
Lead Channel Pacing Threshold Amplitude: 1.25 V
Lead Channel Pacing Threshold Pulse Width: 0.5 ms
Lead Channel Pacing Threshold Pulse Width: 0.5 ms
Lead Channel Sensing Intrinsic Amplitude: 4.6 mV
Lead Channel Sensing Intrinsic Amplitude: 7 mV
Lead Channel Setting Pacing Amplitude: 2 V
Lead Channel Setting Pacing Amplitude: 2.5 V
Lead Channel Setting Pacing Pulse Width: 0.5 ms
Lead Channel Setting Sensing Sensitivity: 4 mV
Pulse Gen Model: 2272
Pulse Gen Serial Number: 3875505

## 2023-02-19 ENCOUNTER — Telehealth: Payer: Self-pay | Admitting: Cardiovascular Disease

## 2023-02-19 NOTE — Telephone Encounter (Signed)
   Pre-operative Risk Assessment    Patient Name: Peter James.  DOB: [birth year] MRN: [id number]      Request for Surgical Clearance    Procedure:  l5-s1 interlaminar epidural steroid injection  Date of Surgery:  Clearance 04/27/23                                 Surgeon:  Dr Rebeca Alert Surgeon's Group or Practice Name:  [facility]  Phone number:  [phone] Ext [phone] Fax number:  [phone]   Type of Clearance Requested:  pharmacy Clearance    Type of Anesthesia:  Local    Additional requests/questions:    SignedFrancesca Jewett   02/19/2023, 2:12 PM

## 2023-02-21 NOTE — Telephone Encounter (Signed)
Patient with diagnosis of A Fib on Eliquis for anticoagulation.    Procedure: l5-s1 interlaminar epidural steroid injection  Date of procedure: 04/27/23   CHA2DS2-VASc Score = 6  This indicates a 9.7% annual risk of stroke. The patient's score is based upon: CHF History: 1 HTN History: 1 Diabetes History: 1 Stroke History: 0 Vascular Disease History: 1 Age Score: 2 Gender Score: 0    CrCl 38 mL/min Platelet count 272K  Per office protocol, patient can hold Eliquis for 3 days prior to procedure.    **This guidance is not considered finalized until pre-operative APP has relayed final recommendations.**

## 2023-02-21 NOTE — Telephone Encounter (Signed)
Patient has an office visit with Sharlene Dory, NP, scheduled for 03/23/2023 and procedure is not scheduled until 04/2023. Therefore, pre-op risk assessment can be completed at that time. Will route this clearance form to [facility] and make sure "PREOP EVAL" is added to appointment notes to make her aware. Will remove from preop pool.  Corrin Parker, PA-C 02/21/2023 11:06 AM

## 2023-02-23 ENCOUNTER — Encounter: Payer: Self-pay | Admitting: Cardiovascular Disease

## 2023-02-23 ENCOUNTER — Ambulatory Visit
Payer: No Typology Code available for payment source | Attending: Cardiovascular Disease | Admitting: Cardiovascular Disease

## 2023-02-23 VITALS — BP 124/64 | HR 70 | Ht 72.0 in | Wt 207.4 lb

## 2023-02-23 DIAGNOSIS — I441 Atrioventricular block, second degree: Secondary | ICD-10-CM | POA: Diagnosis not present

## 2023-02-23 DIAGNOSIS — I480 Paroxysmal atrial fibrillation: Secondary | ICD-10-CM

## 2023-02-23 DIAGNOSIS — D6869 Other thrombophilia: Secondary | ICD-10-CM | POA: Diagnosis not present

## 2023-02-23 NOTE — Progress Notes (Signed)
    PCP: Ignatius Specking, MD   Primary EP:  Dr Nelly Laurence  Merri Brunette. is a 89 y.o. male who presents today for routine electrophysiology followup.  Since last being seen in our clinic, the patient reports doing very well.  Today, he denies symptoms of palpitations, chest pain, shortness of breath,  lower extremity edema, dizziness, presyncope, or syncope.  The patient is otherwise without complaint today.   He is a veteran of the [place] [employer].  He has a history of AV block for which a Abbott dual-chamber pacemaker was placed in November 2021 by Dr. Johney Frame. he has no device related complaints -- no new tenderness, drainage, redness.  At the last visit, we were able to switch him from aspirin to Eliquis which he has been taking without difficulty.     Physical Exam: Vitals:   02/23/23 1044  BP: 124/64  Pulse: 70  SpO2: 96%  Weight: 207 lb 6.4 oz (94.1 kg)  Height: 6' (1.829 m)     Gen: Appears comfortable, well-nourished CV: RRR, no dependent edema The device site is normal -- no tenderness, edema, drainage, redness, threatened erosion. Pulm: breathing easily  Pacemaker interrogation- reviewed in detail today,  See PACEART report    Assessment and Plan:  Symptomatic complete heart block Normal pacemaker function See Pace Art report No changes today he is device dependant today  Afib Burden is 5 % (previously 9%) Continue eliquis 2.5mg  BID Labs December 01, 2022 reviewed  Nonischemic CM/ chronic systolic dysfunction Doing well, appears compensated No change required today   Risks, benefits and potential toxicities for medications prescribed and/or refilled reviewed with patient today.   Return in 1 year  Maurice Small, MD 02/23/2023 11:31 AM

## 2023-02-23 NOTE — Patient Instructions (Addendum)
Medication Instructions:  Continue all current medications.  Labwork: none  Testing/Procedures: none  Follow-Up: 1 year   Any Other Special Instructions Will Be Listed Below (If Applicable).  If you need a refill on your cardiac medications before your next appointment, please call your pharmacy.  

## 2023-02-28 LAB — CUP PACEART INCLINIC DEVICE CHECK
Date Time Interrogation Session: 20241206152806
Implantable Lead Connection Status: 753985
Implantable Lead Connection Status: 753985
Implantable Lead Implant Date: 20211124
Implantable Lead Implant Date: 20211124
Implantable Lead Location: 753859
Implantable Lead Location: 753860
Implantable Pulse Generator Implant Date: 20211124
Pulse Gen Model: 2272
Pulse Gen Serial Number: 3875505

## 2023-03-06 NOTE — Progress Notes (Signed)
Remote pacemaker transmission.   

## 2023-03-06 NOTE — Addendum Note (Signed)
Addended by: Geralyn Flash D on: 03/06/2023 09:54 AM   Modules accepted: Orders

## 2023-03-23 ENCOUNTER — Encounter: Payer: Self-pay | Admitting: Nurse Practitioner

## 2023-03-23 ENCOUNTER — Ambulatory Visit: Payer: No Typology Code available for payment source | Attending: Nurse Practitioner | Admitting: Nurse Practitioner

## 2023-03-23 VITALS — BP 116/70 | HR 70 | Ht 73.0 in | Wt 207.0 lb

## 2023-03-23 DIAGNOSIS — I5022 Chronic systolic (congestive) heart failure: Secondary | ICD-10-CM | POA: Diagnosis not present

## 2023-03-23 DIAGNOSIS — I739 Peripheral vascular disease, unspecified: Secondary | ICD-10-CM

## 2023-03-23 DIAGNOSIS — E785 Hyperlipidemia, unspecified: Secondary | ICD-10-CM

## 2023-03-23 DIAGNOSIS — Z01810 Encounter for preprocedural cardiovascular examination: Secondary | ICD-10-CM | POA: Diagnosis not present

## 2023-03-23 DIAGNOSIS — I10 Essential (primary) hypertension: Secondary | ICD-10-CM | POA: Diagnosis not present

## 2023-03-23 DIAGNOSIS — I442 Atrioventricular block, complete: Secondary | ICD-10-CM

## 2023-03-23 DIAGNOSIS — Z950 Presence of cardiac pacemaker: Secondary | ICD-10-CM

## 2023-03-23 NOTE — Patient Instructions (Addendum)

## 2023-03-23 NOTE — Progress Notes (Signed)
 Cardiology Office Note:  .   Date:  03/23/2023 ID:  Peter James., DOB 07/19/1925, MRN [id number] PCP: Peter Leta NOVAK, MD  [facility] Providers Cardiologist:  Diannah SHAUNNA Maywood, MD Electrophysiologist:  Eulas FORBES Furbish, MD    History of Present Illness: .   Peter James. is a 89 y.o. male with a PMH of CAD, chronic systolic CHF, NICM, A-fib, HTN, HLD, complete heart block, s/p PPM T2DM, vertigo, PAD, neuropathy, exertional dyspnea, and hx of lower GI bleeding, who presents today for pre-operative cardiovascular risk assessment. Has been closely followed by Electrophysiology team. Has not seen general cardiology since 2014.   Last seen by Dr. Furbish on February 23, 2023. Was doing very well at the time. Was doing well on Eliquis .   Our office has received clearance request for an L5-S1 interlaminar epidural steroid injection for surgery on 04/27/2023. Surgeon will be Dr. Dyanne James at [facility]. Our office has been contacted regarding medical clearance.   He presents today for evaluation. He states he is doing well. Denies any chest pain, shortness of breath, palpitations, syncope, presyncope, dizziness, orthopnea, PND, swelling or significant weight changes, acute bleeding, or claudication. Lives independently. Family is very supportive and assists him with groceries and certain house chores.   ROS: Negative. See HPI.   Studies Reviewed: SABRA       EKG: EKG is not ordered today.   Echo 09/2022:  1. Left ventricular ejection fraction, by estimation, is 40 to 45%. The  left ventricle has mildly decreased function. The left ventricle  demonstrates global hypokinesis. Left ventricular diastolic parameters are  consistent with Grade I diastolic  dysfunction (impaired relaxation).   2. Right ventricular systolic function was not well visualized. The right  ventricular size is mildly enlarged. Tricuspid regurgitation signal is  inadequate for assessing PA pressure.   3.  The mitral valve is normal in structure. No evidence of mitral valve  regurgitation. No evidence of mitral stenosis.   4. The aortic valve is tricuspid. Aortic valve regurgitation is mild. No  aortic stenosis is present.   Comparison(s): No significant change from prior study. Prior images  reviewed side by side.   Risk Assessment/Calculations:    CHA2DS2-VASc Score = 6  This indicates a 9.7% annual risk of stroke. The patient's score is based upon: CHF History: 1 HTN History: 1 Diabetes History: 1 Stroke History: 0 Vascular Disease History: 1 Age Score: 2 Gender Score: 0  Physical Exam:   VS:  BP 116/70   Pulse 70   Ht 6' 1 (1.854 m)   Wt 207 lb (93.9 kg)   SpO2 92%   BMI 27.31 kg/m    Wt Readings from Last 3 Encounters:  03/23/23 207 lb (93.9 kg)  02/23/23 207 lb 6.4 oz (94.1 kg)  12/29/22 204 lb 12.8 oz (92.9 kg)    GEN: Well nourished, well developed in no acute distress NECK: No JVD; No carotid bruits CARDIAC: S1/S2, RRR, no murmurs, rubs, gallops RESPIRATORY:  Clear to auscultation without rales, wheezing or rhonchi  ABDOMEN: Soft, non-tender, non-distended EXTREMITIES:  No edema; No deformity   ASSESSMENT AND PLAN: .    Pre-operative cardiovascular risk assessment [name] perioperative risk of a major cardiac event is 6.6% according to the Revised Cardiac Risk Index (RCRI).  Therefore, he is at high risk for perioperative complications.   His functional capacity is fair at 4.64 METs according to the Duke Activity Status Index (DASI). Recommendations: According to ACC/AHA  guidelines, no further cardiovascular testing needed.  The patient may proceed to surgery at acceptable risk.   Antiplatelet and/or Anticoagulation Recommendations: Per office protocol, patient can hold Eliquis  for 3 days prior to procedure. Will route note to requesting party.   2. HFmrEF Stage C, NYHA class I-II symptoms. EF 40-45%. NICM. Euvolemic and well compensated on exam.  Continue current GDMT, medical therapy limited d/t his current BP. Low sodium diet, fluid restriction <2L, and daily weights encouraged. Educated to contact our office for weight gain of 2 lbs overnight or 5 lbs in one week.   3. HTN BP stable. Discussed to monitor BP at home at least 2 hours after medications and sitting for 5-10 minutes. No medication changes at this time. Heart healthy diet and regular cardiovascular exercise encouraged.   4. PAD, HLD Hx of previous bilateral lower extremity tibial interventions, hx of toe amputations on left side. Denies any symptoms. LDL from 2023 was 30. I have requested that his most recent labs are faxed to our office when available. He verbalizes understanding. No med changes at this time. Heart healthy diet and regular cardiovascular exercise encouraged. Continue to f/u with [facility].   5. Complete heart block, s/p PPM Denies any issues. Continue to follow-up with EP.     Dispo: Follow-up with Dr. Mallipeddi or APP in 6 months or sooner if anything changes.   Signed, Almarie Crate, NP

## 2023-04-05 NOTE — Progress Notes (Deleted)
Peter James., male    DOB: [birth year]    MRN: [id number]   Brief patient profile:  89  yobm quit smoking 1974  referred to pulmonary clinic in [place]  [facility]    History of Present Illness  12/01/2022  Pulmonary/ 1st office eval/ [name] / [place] [facility]  Chief Complaint  Patient presents with   Consult    Sob  w/ active   Dyspnea:  walking with cane due to R hip pain, says  breathing difficulty walking to car = 25-50 ft  Cough: none  Sleep: flat bed/ one pillow  SABA use: no better  02: none   Rec Stop the powder inhaler and Try to  use your albuterol inhaler as a rescue medication to be used if you can't catch your breath  Also  Ok to try albuterol x 2 puffs x 15 min before an activity (on alternating days)  that you know would usually make you short of breath  Please schedule a follow up office visit in 4 weeks, sooner if needed  with all medications /inhalers/ solutions in hand      12/29/2022  f/u ov/ office/[name] re: doe /  severe chronic LHD elevation maint on prn saba / did not try saba, did not bring meds  Chief Complaint  Patient presents with   Follow-up  Dyspnea:  no change in doe / very sedentary  Cough: none  Sleeping: flat bed one pillow  s  resp cc  SABA use: too much and they still don't work  02: none    No obvious day to day or daytime variability or assoc excess/ purulent sputum or mucus plugs or hemoptysis or cp or chest tightness, subjective wheeze or overt sinus or hb symptoms.    Also denies any obvious fluctuation of symptoms with weather or environmental changes or other aggravating or alleviating factors except as outlined above   No unusual exposure hx or h/o childhood pna/ asthma or knowledge of premature birth.  Current Allergies, Complete Past Medical History, Past Surgical History, Family History, and Social History were reviewed in [employer] record.  ROS  The  following are not active complaints unless bolded Hoarseness, sore throat, dysphagia, dental problems, itching, sneezing,  nasal congestion or discharge of excess mucus or purulent secretions, ear ache,   fever, chills, sweats, unintended wt loss or wt gain, classically pleuritic or exertional cp,  orthopnea pnd or arm/hand swelling  or leg swelling, presyncope, palpitations, abdominal pain, anorexia, nausea, vomiting, diarrhea  or change in bowel habits or change in bladder habits, change in stools or change in urine, dysuria, hematuria,  rash, arthralgias, visual complaints, headache, numbness, weakness or ataxia or problems with walking or coordination,  change in mood or  memory.        Current Meds  Medication Sig   albuterol (VENTOLIN HFA) 108 (90 Base) MCG/ACT inhaler Inhale 2 puffs into the lungs every 6 (six) hours as needed for wheezing or shortness of breath.   allopurinol (ZYLOPRIM) 300 MG tablet Take 300 mg by mouth daily.    Alogliptin Benzoate 12.5 MG TABS Take 6.25 mg by mouth daily.   amLODipine (NORVASC) 10 MG tablet Take 1 tablet (10 mg total) by mouth daily.   apixaban (ELIQUIS) 2.5 MG TABS tablet Take 1 tablet (2.5 mg total) by mouth 2 (two) times daily.   atorvastatin (LIPITOR) 10 MG tablet Take 1 tablet (10 mg total) by  mouth every evening.   carvedilol (COREG) 3.125 MG tablet Take 3.125 mg by mouth 2 (two) times daily with a meal.   cholecalciferol (VITAMIN D3) 25 MCG (1000 UNIT) tablet Take 1,000 Units by mouth daily.   famotidine (PEPCID) 20 MG tablet One after supper   nortriptyline (PAMELOR) 10 MG capsule Take 10 mg by mouth at bedtime.    pantoprazole (PROTONIX) 40 MG tablet Take 1 tablet (40 mg total) by mouth daily. Take 30-60 min before first meal of the day   polyethylene glycol (MIRALAX / GLYCOLAX) 17 g packet Take 17 g by mouth daily as needed for mild constipation.   tamsulosin (FLOMAX) 0.4 MG CAPS capsule Take 0.4 mg by mouth daily.         Past Medical  History:  Diagnosis Date   Acute lower GI bleeding    Recurrent: 2011, 2012   Bladder cancer ([name])    Transitional cell, 2001   Bradycardia 01/20/2020   CAD (coronary atherosclerotic disease)    Nonobstructive, 05/2012   CHF (congestive heart failure) ([name])    Exertional dyspnea    EF 70-75%, grade 1 diastolic dysfunction, 05/2012   HLD (hyperlipidemia)    HTN (hypertension)    Neuropathy    Other insomnia    PAD (peripheral artery disease) ([name])    ABIs, 2009: 0.89 right; 0.99 left   Type 2 diabetes mellitus ([name])    Valvular heart disease    Vertigo       Objective:   wts   04/06/2023       ***   12/29/22 204 lb 12.8 oz (92.9 kg)  12/01/22 208 lb 6.4 oz (94.5 kg)  09/22/22 203 lb 6.4 oz (92.3 kg)    Vital signs reviewed  04/06/2023  - Note at rest 02 sats  ***% on ***   General appearance:    ***    top denture/ lower partial ***               Assessment

## 2023-04-06 ENCOUNTER — Ambulatory Visit: Payer: No Typology Code available for payment source | Admitting: Internal Medicine

## 2023-04-27 ENCOUNTER — Ambulatory Visit: Payer: No Typology Code available for payment source | Admitting: Internal Medicine

## 2023-05-09 ENCOUNTER — Ambulatory Visit (INDEPENDENT_AMBULATORY_CARE_PROVIDER_SITE_OTHER): Payer: Medicare PPO

## 2023-05-09 DIAGNOSIS — I441 Atrioventricular block, second degree: Secondary | ICD-10-CM

## 2023-05-10 LAB — CUP PACEART REMOTE DEVICE CHECK
Battery Remaining Longevity: 70 mo
Battery Remaining Percentage: 67 %
Battery Voltage: 3.01 V
Brady Statistic AP VP Percent: 7.5 %
Brady Statistic AP VS Percent: 1 %
Brady Statistic AS VP Percent: 91 %
Brady Statistic AS VS Percent: 1 %
Brady Statistic RA Percent Paced: 6.3 %
Brady Statistic RV Percent Paced: 98 %
Date Time Interrogation Session: 20250219020013
Implantable Lead Connection Status: 753985
Implantable Lead Connection Status: 753985
Implantable Lead Implant Date: 20211124
Implantable Lead Implant Date: 20211124
Implantable Lead Location: 753859
Implantable Lead Location: 753860
Implantable Pulse Generator Implant Date: 20211124
Lead Channel Impedance Value: 430 Ohm
Lead Channel Impedance Value: 510 Ohm
Lead Channel Pacing Threshold Amplitude: 0.5 V
Lead Channel Pacing Threshold Amplitude: 1.25 V
Lead Channel Pacing Threshold Pulse Width: 0.5 ms
Lead Channel Pacing Threshold Pulse Width: 0.5 ms
Lead Channel Sensing Intrinsic Amplitude: 4.1 mV
Lead Channel Sensing Intrinsic Amplitude: 4.8 mV
Lead Channel Setting Pacing Amplitude: 2 V
Lead Channel Setting Pacing Amplitude: 2.5 V
Lead Channel Setting Pacing Pulse Width: 0.5 ms
Lead Channel Setting Sensing Sensitivity: 4 mV
Pulse Gen Model: 2272
Pulse Gen Serial Number: 3875505

## 2023-05-24 NOTE — Progress Notes (Signed)
 Peter Brunette., male    DOB: [birth year]    MRN: [id number]   Brief patient profile:  89  yobm quit smoking 1974  referred to pulmonary clinic in [place]  [facility]     History of Present Illness  12/01/2022  Pulmonary/ 1st office eval/ Peter James / [place] [facility]  Chief Complaint  Patient presents with   Consult    Sob  w/ active   Dyspnea:  walking with cane due to R hip pain, says  breathing difficulty walking to car = 25-50 ft  Cough: none  Sleep: flat bed/ one pillow  SABA use: no better  02: none   Rec Stop the powder inhaler and Try to  use your albuterol inhaler as a rescue medication to be used if you can't catch your breath  Also  Ok to try albuterol x 2 puffs x 15 min before an activity (on alternating days)  that you know would usually make you short of breath  Please schedule a follow up office visit in 4 weeks, sooner if needed  with all medications /inhalers/ solutions in hand      12/29/2022  f/u ov/[place] office/Brayan Votaw re: doe /  severe chronic LHD elevation maint on prn saba / did not try saba, did not bring meds  Chief Complaint  Patient presents with   Follow-up  Dyspnea:  no change in doe / very sedentary  Cough: none  Sleeping: flat bed one pillow  s  resp cc  SABA use: too much and they still don't work  02: none  Rec Pantoprazole (protonix) 40 mg   Take  30-60 min before first meal of the day and Pepcid (famotidine)  20 mg after supper  Only use your albuterol as a rescue medication Also  Ok to try albuterol 15 min before an activity (on alternating days)  that you know would usually make you short of breath  Work on inhaler technique:         05/25/2023  f/u ov/[place] office/Peter James re: doe maint on no resp rx   Chief Complaint  Patient presents with   Follow-up    Having some SOB  Dyspnea:  gets out at at door, walks to 3rd pew from front of church q Sunday, slow pace = MMRC3 = can't walk 100 yards even  at a slow pace at a flat grade s stopping due to sob   Cough: none  Sleeping: flat bed one pillow s  resp cc  SABA use: once a day seems to help but very minimal  02: none     No obvious day to day or daytime variability or assoc excess/ purulent sputum or mucus plugs or hemoptysis or cp or chest tightness, subjective wheeze or overt sinus or hb symptoms.    Also denies any obvious fluctuation of symptoms with weather or environmental changes or other aggravating or alleviating factors except as outlined above   No unusual exposure hx or h/o childhood pna/ asthma or knowledge of premature birth.  Current Allergies, Complete Past Medical History, Past Surgical History, Family History, and Social History were reviewed in [employer] record.  ROS  The following are not active complaints unless bolded Hoarseness, sore throat, dysphagia, dental problems, itching, sneezing,  nasal congestion or discharge of excess mucus or purulent secretions, ear ache,   fever, chills, sweats, unintended wt loss or wt gain, classically pleuritic or exertional cp,  orthopnea pnd or  arm/hand swelling  or leg swelling, presyncope, palpitations, abdominal pain, anorexia, nausea, vomiting, diarrhea  or change in bowel habits or change in bladder habits, change in stools or change in urine, dysuria, hematuria,  rash, arthralgias, visual complaints, headache, numbness, weakness or ataxia or problems with walking/ does better with cane or coordination,  change in mood or  memory.        Current Meds  Medication Sig   albuterol (VENTOLIN HFA) 108 (90 Base) MCG/ACT inhaler Inhale 2 puffs into the lungs every 6 (six) hours as needed for wheezing or shortness of breath.   allopurinol (ZYLOPRIM) 300 MG tablet Take 300 mg by mouth daily.    Alogliptin Benzoate 12.5 MG TABS Take 6.25 mg by mouth daily.   amLODipine (NORVASC) 10 MG tablet Take 1 tablet (10 mg total) by mouth daily.   apixaban (ELIQUIS) 2.5  MG TABS tablet Take 1 tablet (2.5 mg total) by mouth 2 (two) times daily.   atorvastatin (LIPITOR) 10 MG tablet Take 1 tablet (10 mg total) by mouth every evening.   carvedilol (COREG) 3.125 MG tablet Take 3.125 mg by mouth 2 (two) times daily with a meal.   cholecalciferol (VITAMIN D3) 25 MCG (1000 UNIT) tablet Take 1,000 Units by mouth daily.   famotidine (PEPCID) 20 MG tablet One after supper   nortriptyline (PAMELOR) 10 MG capsule Take 10 mg by mouth at bedtime.    pantoprazole (PROTONIX) 40 MG tablet Take 1 tablet (40 mg total) by mouth daily. Take 30-60 min before first meal of the day   polyethylene glycol (MIRALAX / GLYCOLAX) 17 g packet Take 17 g by mouth daily as needed for mild constipation.   tamsulosin (FLOMAX) 0.4 MG CAPS capsule Take 0.4 mg by mouth daily.           Past Medical History:  Diagnosis Date   Acute lower GI bleeding    Recurrent: 2011, 2012   Bladder cancer ([name])    Transitional cell, 2001   Bradycardia 01/20/2020   CAD (coronary atherosclerotic disease)    Nonobstructive, 05/2012   CHF (congestive heart failure) ([name])    Exertional dyspnea    EF 70-75%, grade 1 diastolic dysfunction, 05/2012   HLD (hyperlipidemia)    HTN (hypertension)    Neuropathy    Other insomnia    PAD (peripheral artery disease) ([name])    ABIs, 2009: 0.89 right; 0.99 left   Type 2 diabetes mellitus ([name])    Valvular heart disease    Vertigo       Objective:    Wts    05/25/2023         202   12/29/22 204 lb 12.8 oz (92.9 kg)  12/01/22 208 lb 6.4 oz (94.5 kg)  09/22/22 203 lb 6.4 oz (92.3 kg)    Vital signs reviewed  05/25/2023  - Note at rest 02 sats  97% on RA   General appearance:    pleasant well preserved amb bm nad       HEENT : Oropharynx  clear/ lower partial, upper full dentures         NECK :  without  apparent JVD/ palpable Nodes/TM    LUNGS: no acc muscle use,  Nl contour chest which is clear to A and P bilaterally without cough on insp or exp  maneuvers   CV:  RRR  no s3 or murmur or increase in P2, and no edema   ABD:  soft and nontender   MS:  Gait slow steady with cane  ext warm without deformities Or obvious joint restrictions  calf tenderness, cyanosis or clubbing    SKIN: warm and dry without lesions    NEURO:  alert, approp, nl sensorium with  no motor or cerebellar deficits apparent.         I personally reviewed images and agree with radiology impression as follows:  CXR:   pa and lateral   12/29/22  No acute findings. Stable elevation of the left hemidiaphragm and bibasilar scarring.   Assessment

## 2023-05-25 ENCOUNTER — Encounter: Payer: Self-pay | Admitting: Internal Medicine

## 2023-05-25 ENCOUNTER — Ambulatory Visit: Payer: No Typology Code available for payment source | Admitting: Internal Medicine

## 2023-05-25 VITALS — BP 134/72 | HR 97 | Ht 73.0 in | Wt 202.0 lb

## 2023-05-25 DIAGNOSIS — R0609 Other forms of dyspnea: Secondary | ICD-10-CM

## 2023-05-25 DIAGNOSIS — Z87891 Personal history of nicotine dependence: Secondary | ICD-10-CM

## 2023-05-25 NOTE — Patient Instructions (Addendum)
 Ok to stop the acid medications to see what if any difference it makes with your breathing  / coughing or need for albuterol and if it doesn't make any difference leave them off    If you are satisfied with your treatment plan,  let your doctor know and he/she can either refill your medications or you can return here when your prescription runs out.     If in any way you are not 100% satisfied,  please tell us.  If 100% better, tell your friends!  Pulmonary follow up is as needed

## 2023-05-26 ENCOUNTER — Encounter: Payer: Self-pay | Admitting: Internal Medicine

## 2023-05-26 NOTE — Assessment & Plan Note (Signed)
 Onset 2022  but much worse since spring 2024  - Elevation of L HD dating back to at least 2012  - Echo 09/26/22 EF 40-45% with mild AI but normal LA - 12/01/2022   Walked on RA  x  1  lap(s) =  approx 150  ft  @ slow/cane  pace, stopped due to sob/ fatigue with lowest 02 sats 98%  - Allergy screen 12/01/2022 >  Eos 0. 1/  IgE  139  -  added empirical gerd rx 12/29/2022 >>>no change in symptoms so ok to stop  He has stabilized on just prn saba and happy as long as he can walk to church with findings as above but really nothing further to add at this point so f/u can be prn          Each maintenance medication was reviewed in detail including emphasizing most importantly the difference between maintenance and prns and under what circumstances the prns are to be triggered using an action plan format where appropriate.  Total time for H and P, chart review, counseling, reviewing hfa  device(s) and generating customized AVS unique to this office visit / same day charting = 23 min final summary f/u ov

## 2023-06-13 NOTE — Addendum Note (Signed)
 Addended by: Elease Etienne A on: 06/13/2023 11:20 AM   Modules accepted: Orders

## 2023-06-13 NOTE — Progress Notes (Signed)
 Remote pacemaker transmission.

## 2023-08-08 ENCOUNTER — Ambulatory Visit (INDEPENDENT_AMBULATORY_CARE_PROVIDER_SITE_OTHER): Payer: Medicare PPO

## 2023-08-08 DIAGNOSIS — I441 Atrioventricular block, second degree: Secondary | ICD-10-CM

## 2023-08-08 LAB — CUP PACEART REMOTE DEVICE CHECK
Battery Remaining Longevity: 66 mo
Battery Remaining Percentage: 64 %
Battery Voltage: 3.01 V
Brady Statistic AP VP Percent: 5.9 %
Brady Statistic AP VS Percent: 1 %
Brady Statistic AS VP Percent: 92 %
Brady Statistic AS VS Percent: 1 %
Brady Statistic RA Percent Paced: 4.8 %
Brady Statistic RV Percent Paced: 98 %
Date Time Interrogation Session: 20250521020018
Implantable Lead Connection Status: 753985
Implantable Lead Connection Status: 753985
Implantable Lead Implant Date: 20211124
Implantable Lead Implant Date: 20211124
Implantable Lead Location: 753859
Implantable Lead Location: 753860
Implantable Pulse Generator Implant Date: 20211124
Lead Channel Impedance Value: 460 Ohm
Lead Channel Impedance Value: 510 Ohm
Lead Channel Pacing Threshold Amplitude: 0.5 V
Lead Channel Pacing Threshold Amplitude: 1.25 V
Lead Channel Pacing Threshold Pulse Width: 0.5 ms
Lead Channel Pacing Threshold Pulse Width: 0.5 ms
Lead Channel Sensing Intrinsic Amplitude: 3.6 mV
Lead Channel Sensing Intrinsic Amplitude: 6.8 mV
Lead Channel Setting Pacing Amplitude: 2 V
Lead Channel Setting Pacing Amplitude: 2.5 V
Lead Channel Setting Pacing Pulse Width: 0.5 ms
Lead Channel Setting Sensing Sensitivity: 4 mV
Pulse Gen Model: 2272
Pulse Gen Serial Number: 3875505

## 2023-08-16 ENCOUNTER — Ambulatory Visit: Payer: Self-pay | Admitting: Cardiovascular Disease

## 2023-09-27 NOTE — Progress Notes (Signed)
 Remote pacemaker transmission.

## 2023-09-28 ENCOUNTER — Ambulatory Visit: Attending: Nurse Practitioner | Admitting: Nurse Practitioner

## 2023-09-28 ENCOUNTER — Encounter: Payer: Self-pay | Admitting: Nurse Practitioner

## 2023-09-28 VITALS — BP 122/64 | HR 80 | Ht 72.0 in | Wt 204.4 lb

## 2023-09-28 DIAGNOSIS — I5022 Chronic systolic (congestive) heart failure: Secondary | ICD-10-CM

## 2023-09-28 DIAGNOSIS — I441 Atrioventricular block, second degree: Secondary | ICD-10-CM

## 2023-09-28 DIAGNOSIS — R0609 Other forms of dyspnea: Secondary | ICD-10-CM

## 2023-09-28 DIAGNOSIS — I10 Essential (primary) hypertension: Secondary | ICD-10-CM

## 2023-09-28 DIAGNOSIS — I739 Peripheral vascular disease, unspecified: Secondary | ICD-10-CM

## 2023-09-28 DIAGNOSIS — E785 Hyperlipidemia, unspecified: Secondary | ICD-10-CM | POA: Diagnosis not present

## 2023-09-28 DIAGNOSIS — Z950 Presence of cardiac pacemaker: Secondary | ICD-10-CM | POA: Diagnosis not present

## 2023-09-28 DIAGNOSIS — Z8679 Personal history of other diseases of the circulatory system: Secondary | ICD-10-CM | POA: Diagnosis not present

## 2023-09-28 NOTE — Patient Instructions (Addendum)

## 2023-09-28 NOTE — Progress Notes (Unsigned)
 Cardiology Office Note:  .   Date:  09/28/2023 ID:  Peter Durenda Raddle., DOB 1925-11-09, MRN [id number] PCP: Rosamond Leta NOVAK, MD  [facility] Providers Cardiologist:  Diannah SHAUNNA Maywood, MD Electrophysiologist:  Eulas FORBES Furbish, MD    History of Present Illness: .   Peter Munday. is a 89 y.o. male with a PMH of CAD, chronic systolic CHF, NICM, A-fib, HTN, HLD, complete heart block, s/p PPM T2DM, vertigo, PAD, neuropathy, exertional dyspnea, and hx of lower GI bleeding, who presents today for scheduled follow-up. Has been closely followed by Electrophysiology team. Has not seen general cardiology since 2014.   Last seen by Dr. Furbish on February 23, 2023. Was doing very well at the time. Was doing well on Eliquis .   Our office has received clearance request for an L5-S1 interlaminar epidural steroid injection for surgery on 04/27/2023. Surgeon will be Dr. Dyanne Councilman at [facility]. Our office has been contacted regarding medical clearance.   03/23/2023 - He presents today for evaluation. He states he is doing well. Denies any chest pain, shortness of breath, palpitations, syncope, presyncope, dizziness, orthopnea, PND, swelling or significant weight changes, acute bleeding, or claudication. Lives independently. Family is very supportive and assists him with groceries and certain house chores.   09/28/2023 - Here for follow-up with his family member.  Overall doing well.  Does have some dyspnea on exertion and was given a new inhaler, feels as though his inhaler not working helping his breathing is much.  Has notices a little bit more due to the summer heat.  This has been chronic for him. Overall doing well. Denies any chest pain,  palpitations, syncope, presyncope, dizziness, orthopnea, PND, swelling or significant weight changes, acute bleeding, or claudication.  ROS: Negative. See HPI.   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   Studies Reviewed: SABRA       EKG: EKG  Interpretation Date/Time:  Friday September 28 2023 14:05:13 EDT Ventricular Rate:  80 PR Interval:  200 QRS Duration:  154 QT Interval:  426 QTC Calculation: 491 R Axis:   -72  Text Interpretation: Atrial-sensed ventricular-paced rhythm When compared with ECG of 22-Sep-2022 11:40, Vent. rate has decreased BY   4 BPM Confirmed by Miriam Norris [phone]) on 09/28/2023 2:13:53 PM   Echo 09/2022:  1. Left ventricular ejection fraction, by estimation, is 40 to 45%. The  left ventricle has mildly decreased function. The left ventricle  demonstrates global hypokinesis. Left ventricular diastolic parameters are  consistent with Grade I diastolic  dysfunction (impaired relaxation).   2. Right ventricular systolic function was not well visualized. The right  ventricular size is mildly enlarged. Tricuspid regurgitation signal is  inadequate for assessing PA pressure.   3. The mitral valve is normal in structure. No evidence of mitral valve  regurgitation. No evidence of mitral stenosis.   4. The aortic valve is tricuspid. Aortic valve regurgitation is mild. No  aortic stenosis is present.   Comparison(s): No significant change from prior study. Prior images  reviewed side by side.   Risk Assessment/Calculations:    CHA2DS2-VASc Score = 6  This indicates a 9.7% annual risk of stroke. The patient's score is based upon: CHF History: 1 HTN History: 1 Diabetes History: 1 Stroke History: 0 Vascular Disease History: 1 Age Score: 2 Gender Score: 0  Physical Exam:   VS:  BP 122/64 (BP Location: Left Arm)   Pulse 80   Ht 6' (1.829 m)   Wt 204 lb 6.4  oz (92.7 kg)   SpO2 95%   BMI 27.72 kg/m    Wt Readings from Last 3 Encounters:  09/28/23 204 lb 6.4 oz (92.7 kg)  05/25/23 202 lb (91.6 kg)  03/23/23 207 lb (93.9 kg)    GEN: Well nourished, well developed in no acute distress NECK: No JVD; No carotid bruits CARDIAC: S1/S2, RRR, no murmurs, rubs, gallops RESPIRATORY:  Clear to auscultation  without rales, wheezing or rhonchi  ABDOMEN: Soft, non-tender, non-distended EXTREMITIES:  No edema; No deformity   ASSESSMENT AND PLAN: .    1. HFmrEF, DOE Stage C, NYHA class II symptoms. EF 40-45%. NICM. Euvolemic and well compensated on exam. DOE most likely related to summer heat. Advised to follow-up with provider who prescribed his inhaler - he verbalized understanding. Continue current GDMT, medical therapy limited d/t his current BP. Low sodium diet, fluid restriction <2L, and daily weights encouraged. Educated to contact our office for weight gain of 2 lbs overnight or 5 lbs in one week.   3. HTN BP stable. Discussed to monitor BP at home at least 2 hours after medications and sitting for 5-10 minutes. No medication changes at this time. Heart healthy diet and regular cardiovascular exercise encouraged.   4. PAD, HLD Hx of previous bilateral lower extremity tibial interventions, hx of toe amputations on left side. Denies any symptoms. LDL from 2023 was 30. I have requested that his most recent labs are faxed to our office when available. He verbalizes understanding. No med changes at this time. Heart healthy diet and regular cardiovascular exercise encouraged. Continue to f/u with [facility].   5. Complete heart block, s/p PPM Denies any issues. Most recent remote device check revealed normal device function. Continue to follow-up with EP.     Dispo: Care and ED precautions discussed. Follow-up with Dr. Mallipeddi or APP in 6 months or sooner if anything changes.   Signed, Almarie Crate, NP

## 2023-11-07 ENCOUNTER — Ambulatory Visit (INDEPENDENT_AMBULATORY_CARE_PROVIDER_SITE_OTHER): Payer: Medicare PPO

## 2023-11-07 DIAGNOSIS — I441 Atrioventricular block, second degree: Secondary | ICD-10-CM | POA: Diagnosis not present

## 2023-11-08 LAB — CUP PACEART REMOTE DEVICE CHECK
Battery Remaining Longevity: 64 mo
Battery Remaining Percentage: 62 %
Battery Voltage: 3.01 V
Brady Statistic AP VP Percent: 5.1 %
Brady Statistic AP VS Percent: 1 %
Brady Statistic AS VP Percent: 93 %
Brady Statistic AS VS Percent: 1 %
Brady Statistic RA Percent Paced: 4.1 %
Brady Statistic RV Percent Paced: 99 %
Date Time Interrogation Session: 20250820020015
Implantable Lead Connection Status: 753985
Implantable Lead Connection Status: 753985
Implantable Lead Implant Date: 20211124
Implantable Lead Implant Date: 20211124
Implantable Lead Location: 753859
Implantable Lead Location: 753860
Implantable Pulse Generator Implant Date: 20211124
Lead Channel Impedance Value: 440 Ohm
Lead Channel Impedance Value: 510 Ohm
Lead Channel Pacing Threshold Amplitude: 0.5 V
Lead Channel Pacing Threshold Amplitude: 1.25 V
Lead Channel Pacing Threshold Pulse Width: 0.5 ms
Lead Channel Pacing Threshold Pulse Width: 0.5 ms
Lead Channel Sensing Intrinsic Amplitude: 2.3 mV
Lead Channel Sensing Intrinsic Amplitude: 5.9 mV
Lead Channel Setting Pacing Amplitude: 2 V
Lead Channel Setting Pacing Amplitude: 2.5 V
Lead Channel Setting Pacing Pulse Width: 0.5 ms
Lead Channel Setting Sensing Sensitivity: 4 mV
Pulse Gen Model: 2272
Pulse Gen Serial Number: 3875505

## 2023-11-12 ENCOUNTER — Ambulatory Visit: Payer: Self-pay | Admitting: Cardiovascular Disease

## 2023-12-12 NOTE — Progress Notes (Signed)
 Remote PPM Transmission

## 2023-12-20 DIAGNOSIS — E7800 Pure hypercholesterolemia, unspecified: Secondary | ICD-10-CM | POA: Diagnosis not present

## 2023-12-20 DIAGNOSIS — Z7189 Other specified counseling: Secondary | ICD-10-CM | POA: Diagnosis not present

## 2023-12-20 DIAGNOSIS — Z299 Encounter for prophylactic measures, unspecified: Secondary | ICD-10-CM | POA: Diagnosis not present

## 2023-12-20 DIAGNOSIS — Z0000 Encounter for general adult medical examination without abnormal findings: Secondary | ICD-10-CM | POA: Diagnosis not present

## 2023-12-20 DIAGNOSIS — E1165 Type 2 diabetes mellitus with hyperglycemia: Secondary | ICD-10-CM | POA: Diagnosis not present

## 2023-12-20 DIAGNOSIS — I10 Essential (primary) hypertension: Secondary | ICD-10-CM | POA: Diagnosis not present

## 2023-12-20 DIAGNOSIS — Z1331 Encounter for screening for depression: Secondary | ICD-10-CM | POA: Diagnosis not present

## 2023-12-20 DIAGNOSIS — R5383 Other fatigue: Secondary | ICD-10-CM | POA: Diagnosis not present

## 2023-12-20 DIAGNOSIS — Z1339 Encounter for screening examination for other mental health and behavioral disorders: Secondary | ICD-10-CM | POA: Diagnosis not present

## 2024-02-06 ENCOUNTER — Ambulatory Visit (INDEPENDENT_AMBULATORY_CARE_PROVIDER_SITE_OTHER): Payer: Medicare PPO

## 2024-02-06 DIAGNOSIS — I441 Atrioventricular block, second degree: Secondary | ICD-10-CM

## 2024-02-07 LAB — CUP PACEART REMOTE DEVICE CHECK
Battery Remaining Longevity: 61 mo
Battery Remaining Percentage: 59 %
Battery Voltage: 3.01 V
Brady Statistic AP VP Percent: 4.8 %
Brady Statistic AP VS Percent: 1 %
Brady Statistic AS VP Percent: 94 %
Brady Statistic AS VS Percent: 1 %
Brady Statistic RA Percent Paced: 3.9 %
Brady Statistic RV Percent Paced: 99 %
Date Time Interrogation Session: 20251119023251
Implantable Lead Connection Status: 753985
Implantable Lead Connection Status: 753985
Implantable Lead Implant Date: 20211124
Implantable Lead Implant Date: 20211124
Implantable Lead Location: 753859
Implantable Lead Location: 753860
Implantable Pulse Generator Implant Date: 20211124
Lead Channel Impedance Value: 460 Ohm
Lead Channel Impedance Value: 510 Ohm
Lead Channel Pacing Threshold Amplitude: 0.5 V
Lead Channel Pacing Threshold Amplitude: 1.25 V
Lead Channel Pacing Threshold Pulse Width: 0.5 ms
Lead Channel Pacing Threshold Pulse Width: 0.5 ms
Lead Channel Sensing Intrinsic Amplitude: 2.3 mV
Lead Channel Sensing Intrinsic Amplitude: 6 mV
Lead Channel Setting Pacing Amplitude: 2 V
Lead Channel Setting Pacing Amplitude: 2.5 V
Lead Channel Setting Pacing Pulse Width: 0.5 ms
Lead Channel Setting Sensing Sensitivity: 4 mV
Pulse Gen Model: 2272
Pulse Gen Serial Number: 3875505

## 2024-02-08 NOTE — Progress Notes (Signed)
 Remote PPM Transmission

## 2024-02-18 ENCOUNTER — Ambulatory Visit: Payer: Self-pay | Admitting: Cardiovascular Disease

## 2024-02-22 ENCOUNTER — Ambulatory Visit: Attending: Cardiovascular Disease | Admitting: Cardiovascular Disease

## 2024-02-22 ENCOUNTER — Encounter: Payer: Self-pay | Admitting: Cardiovascular Disease

## 2024-02-22 VITALS — BP 124/80 | HR 85 | Ht 73.0 in | Wt 205.0 lb

## 2024-02-22 DIAGNOSIS — I480 Paroxysmal atrial fibrillation: Secondary | ICD-10-CM

## 2024-02-22 DIAGNOSIS — R001 Bradycardia, unspecified: Secondary | ICD-10-CM

## 2024-02-22 DIAGNOSIS — I441 Atrioventricular block, second degree: Secondary | ICD-10-CM | POA: Diagnosis not present

## 2024-02-22 LAB — CUP PACEART INCLINIC DEVICE CHECK
Battery Remaining Longevity: 60 mo
Battery Voltage: 2.99 V
Brady Statistic RA Percent Paced: 3.9 %
Brady Statistic RV Percent Paced: 99 %
Date Time Interrogation Session: 20251205130531
Implantable Lead Connection Status: 753985
Implantable Lead Connection Status: 753985
Implantable Lead Implant Date: 20211124
Implantable Lead Implant Date: 20211124
Implantable Lead Location: 753859
Implantable Lead Location: 753860
Implantable Pulse Generator Implant Date: 20211124
Lead Channel Impedance Value: 475 Ohm
Lead Channel Impedance Value: 525 Ohm
Lead Channel Pacing Threshold Amplitude: 0.75 V
Lead Channel Pacing Threshold Amplitude: 0.75 V
Lead Channel Pacing Threshold Amplitude: 1 V
Lead Channel Pacing Threshold Amplitude: 1 V
Lead Channel Pacing Threshold Pulse Width: 0.5 ms
Lead Channel Pacing Threshold Pulse Width: 0.5 ms
Lead Channel Pacing Threshold Pulse Width: 0.5 ms
Lead Channel Pacing Threshold Pulse Width: 0.5 ms
Lead Channel Sensing Intrinsic Amplitude: 4.5 mV
Lead Channel Sensing Intrinsic Amplitude: 5.6 mV
Lead Channel Setting Pacing Amplitude: 2 V
Lead Channel Setting Pacing Amplitude: 2.5 V
Lead Channel Setting Pacing Pulse Width: 0.5 ms
Lead Channel Setting Sensing Sensitivity: 4 mV
Pulse Gen Model: 2272
Pulse Gen Serial Number: 3875505

## 2024-02-22 NOTE — Patient Instructions (Signed)

## 2024-02-22 NOTE — Progress Notes (Signed)
    PCP: Rosamond Leta NOVAK, MD   Primary EP:  Dr Nancey  [name]. is a 89 y.o. male who presents today for routine electrophysiology followup.  Since last being seen in our clinic, the patient reports doing very well.  Today, he denies symptoms of palpitations, chest pain, shortness of breath,  lower extremity edema, dizziness, presyncope, or syncope.  The patient is otherwise without complaint today.   He is a veteran of the [place] War.  He has a history of AV block for which a Abbott dual-chamber pacemaker was placed in November 2021 by Dr. Kelsie.  We switched him from aspirin  to Eliquis  which he has been taking without difficulty.  he has no device related complaints -- no new tenderness, drainage, redness.     Physical Exam: Vitals:   02/22/24 1147  BP: 124/80  Pulse: 85  SpO2: 98%  Weight: 205 lb (93 kg)  Height: 6' 1 (1.854 m)     Gen: Appears comfortable, well-nourished CV: RRR, no dependent edema The device site is normal -- no tenderness, edema, drainage, redness, threatened erosion. Pulm: breathing easily  Pacemaker interrogation- reviewed in detail today,  See PACEART report    Assessment and Plan:  Symptomatic complete heart block Normal pacemaker function See Pace Art report No changes today he is device dependant today  Afib Burden is 5 % (previously 9%) Continue eliquis  2.5mg  BID No bleeding issues  Nonischemic CM/ chronic systolic dysfunction Doing well, appears compensated No change required today   Risks, benefits and potential toxicities for medications prescribed and/or refilled reviewed with patient today.   Return in 1 year  Eulas FORBES Nancey, MD 02/22/2024 11:57 AM

## 2024-03-06 ENCOUNTER — Ambulatory Visit: Payer: Self-pay | Admitting: Cardiovascular Disease

## 2024-04-04 ENCOUNTER — Encounter: Payer: Self-pay | Admitting: Nurse Practitioner

## 2024-04-04 ENCOUNTER — Ambulatory Visit: Attending: Nurse Practitioner | Admitting: Nurse Practitioner

## 2024-04-04 ENCOUNTER — Encounter: Payer: Self-pay | Admitting: *Deleted

## 2024-04-04 VITALS — BP 128/64 | HR 76 | Ht 72.0 in | Wt 200.4 lb

## 2024-04-04 DIAGNOSIS — E785 Hyperlipidemia, unspecified: Secondary | ICD-10-CM | POA: Diagnosis not present

## 2024-04-04 DIAGNOSIS — I5020 Unspecified systolic (congestive) heart failure: Secondary | ICD-10-CM

## 2024-04-04 DIAGNOSIS — I739 Peripheral vascular disease, unspecified: Secondary | ICD-10-CM

## 2024-04-04 DIAGNOSIS — Z950 Presence of cardiac pacemaker: Secondary | ICD-10-CM | POA: Diagnosis not present

## 2024-04-04 DIAGNOSIS — Z8679 Personal history of other diseases of the circulatory system: Secondary | ICD-10-CM

## 2024-04-04 DIAGNOSIS — I10 Essential (primary) hypertension: Secondary | ICD-10-CM | POA: Diagnosis not present

## 2024-04-04 NOTE — Progress Notes (Signed)
 " Cardiology Office Note:  .   Date: 04/04/2024 ID:  Peter Durenda Raddle., DOB Nov 29, 1925, MRN [id number] PCP: Peter Leta NOVAK, MD  [facility] Providers Cardiologist:  Peter SHAUNNA Maywood, MD Electrophysiologist:  Peter FORBES Furbish, MD    History of Present Illness: .   Peter Sinn. is a 89 y.o. male with a PMH of CAD, chronic systolic CHF, NICM, A-fib, HTN, HLD, complete heart block, s/p PPM T2DM, vertigo, PAD, neuropathy, exertional dyspnea, and hx of lower GI bleeding, who presents today for scheduled follow-up. Has been closely followed by Electrophysiology team. Has not seen general cardiology since 2014.   Last seen by Dr. Furbish on February 23, 2023. Was doing very well at the time. Was doing well on Eliquis .   Our office has received clearance request for an L5-S1 interlaminar epidural steroid injection for surgery on 04/27/2023. Surgeon will be Dr. Dyanne James at [facility]. Our office has been contacted regarding medical clearance.   03/23/2023 - He presents today for evaluation. He states he is doing well. Denies any chest pain, shortness of breath, palpitations, syncope, presyncope, dizziness, orthopnea, PND, swelling or significant weight changes, acute bleeding, or claudication. Lives independently. Family is very supportive and assists him with groceries and certain house chores.   09/28/2023 - Here for follow-up with his family member.  Overall doing well.  Does have some dyspnea on exertion and was given a new inhaler, feels as though his inhaler not working helping his breathing is much.  Has notices a little bit more due to the summer heat.  This has been chronic for him. Overall doing well. Denies any chest pain,  palpitations, syncope, presyncope, dizziness, orthopnea, PND, swelling or significant weight changes, acute bleeding, or claudication.  04/04/2024 - Here for follow-up. Doing very well. Denies any chest pain, shortness of breath, palpitations, syncope,  presyncope, dizziness, orthopnea, PND, swelling or significant weight changes, acute bleeding, or claudication.  ROS: Negative. See HPI.   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   Studies Reviewed: SABRA       EKG: EKG is not ordered today.      Echo 09/2022:  1. Left ventricular ejection fraction, by estimation, is 40 to 45%. The  left ventricle has mildly decreased function. The left ventricle  demonstrates global hypokinesis. Left ventricular diastolic parameters are  consistent with Grade I diastolic  dysfunction (impaired relaxation).   2. Right ventricular systolic function was not well visualized. The right  ventricular size is mildly enlarged. Tricuspid regurgitation signal is  inadequate for assessing PA pressure.   3. The mitral valve is normal in structure. No evidence of mitral valve  regurgitation. No evidence of mitral stenosis.   4. The aortic valve is tricuspid. Aortic valve regurgitation is mild. No  aortic stenosis is present.   Comparison(s): No significant change from prior study. Prior images  reviewed side by side.    Physical Exam:   VS:  BP 128/64 (BP Location: Left Arm, Patient Position: Sitting, Cuff Size: Large)   Pulse 76   Ht 6' (1.829 m)   Wt 200 lb 6.4 oz (90.9 kg)   BMI 27.18 kg/m    Wt Readings from Last 3 Encounters:  04/04/24 200 lb 6.4 oz (90.9 kg)  02/22/24 205 lb (93 kg)  09/28/23 204 lb 6.4 oz (92.7 kg)    GEN: Well nourished, well developed in no acute distress NECK: No JVD; No carotid bruits CARDIAC: S1/S2, RRR, no murmurs, rubs, gallops RESPIRATORY:  Clear to auscultation without rales, wheezing or rhonchi  ABDOMEN: Soft, non-tender, non-distended EXTREMITIES:  No edema; No deformity   ASSESSMENT AND PLAN: .    1. HFmrEF Stage C, NYHA class I symptoms. EF 40-45%. NICM. Euvolemic and well compensated on exam. Continue current GDMT, medical therapy limited d/t BP trends. Low sodium diet, fluid restriction <2L, and daily weights encouraged.  Educated to contact our office for weight gain of 2 lbs overnight or 5 lbs in one week.   3. HTN BP stable. Discussed to monitor BP at home at least 2 hours after medications and sitting for 5-10 minutes. No medication changes at this time. Heart healthy diet and regular cardiovascular exercise encouraged.   4. PAD, HLD Hx of previous bilateral lower extremity tibial interventions, hx of toe amputations on left side. Denies any symptoms. LDL from 2023 was 30. I have requested that his most recent labs are faxed to our office when available. He verbalizes understanding. No med changes at this time. Heart healthy diet and regular cardiovascular exercise encouraged. Continue to f/u with [facility].   5. Complete heart block, s/p PPM Denies any issues. Most recent device check revealed normal device function. Continue to follow-up with EP.     Dispo: Care and ED precautions discussed. Follow-up with Dr. Mallipeddi or APP in 6 months or sooner if anything changes.   Signed, Peter Crate, NP   "

## 2024-04-04 NOTE — Patient Instructions (Signed)

## 2024-04-08 ENCOUNTER — Telehealth: Payer: Self-pay | Admitting: *Deleted

## 2024-04-08 DIAGNOSIS — E785 Hyperlipidemia, unspecified: Secondary | ICD-10-CM

## 2024-04-08 DIAGNOSIS — I739 Peripheral vascular disease, unspecified: Secondary | ICD-10-CM

## 2024-04-08 DIAGNOSIS — Z79899 Other long term (current) drug therapy: Secondary | ICD-10-CM

## 2024-04-08 DIAGNOSIS — I5020 Unspecified systolic (congestive) heart failure: Secondary | ICD-10-CM

## 2024-04-08 DIAGNOSIS — R0602 Shortness of breath: Secondary | ICD-10-CM

## 2024-04-08 DIAGNOSIS — R0609 Other forms of dyspnea: Secondary | ICD-10-CM

## 2024-04-08 DIAGNOSIS — I480 Paroxysmal atrial fibrillation: Secondary | ICD-10-CM

## 2024-04-08 DIAGNOSIS — I10 Essential (primary) hypertension: Secondary | ICD-10-CM

## 2024-04-08 NOTE — Telephone Encounter (Signed)
-----   Message from Almarie Crate, NP sent at 04/08/2024 10:52 AM EST ----- Regarding: RE: No recent lab work available per PCP office Gotcha! Thanks for this heads up. Let's arrange a CBC, CMET, and FLP in 1-2 weeks for his annual labs.   Thanks!  Best, Almarie Crate, NP ----- Message ----- From: Lenon Jinnie HERO, RN Sent: 04/08/2024  10:08 AM EST To: Almarie Crate, NP Subject: No recent lab work available per PCP office    Walterine Almarie!  FYI-You request his PCP lab work and I received a response from them saying he did not show up for scheduled lab test on 12/20/2023. His last lab work that was done by his PCP was in 06/2021.

## 2024-04-09 ENCOUNTER — Encounter: Payer: Self-pay | Admitting: *Deleted

## 2024-04-09 NOTE — Telephone Encounter (Signed)
 Letter mailed with instructions for lab work.
